# Patient Record
Sex: Male | Born: 1935 | Race: White | Hispanic: No | Smoking: Former smoker
Health system: Southern US, Community
[De-identification: ages and names within clinical notes are randomized; demographics above are authoritative.]

## PROBLEM LIST (undated history)

## (undated) DIAGNOSIS — K219 Gastro-esophageal reflux disease without esophagitis: Secondary | ICD-10-CM

## (undated) DIAGNOSIS — H9190 Unspecified hearing loss, unspecified ear: Secondary | ICD-10-CM

## (undated) DIAGNOSIS — K922 Gastrointestinal hemorrhage, unspecified: Secondary | ICD-10-CM

## (undated) HISTORY — PX: APPENDECTOMY: SHX54

## (undated) HISTORY — PX: OTHER SURGICAL HISTORY: SHX169

---

## 2006-04-02 ENCOUNTER — Ambulatory Visit: Payer: Self-pay | Admitting: Ophthalmology

## 2006-04-13 ENCOUNTER — Ambulatory Visit: Payer: Self-pay | Admitting: Ophthalmology

## 2008-11-26 ENCOUNTER — Observation Stay: Payer: Self-pay | Admitting: Surgery

## 2011-04-28 ENCOUNTER — Ambulatory Visit: Payer: Self-pay | Admitting: Ophthalmology

## 2011-05-11 ENCOUNTER — Ambulatory Visit: Payer: Self-pay | Admitting: Ophthalmology

## 2016-04-12 DIAGNOSIS — K922 Gastrointestinal hemorrhage, unspecified: Secondary | ICD-10-CM

## 2016-04-12 HISTORY — DX: Gastrointestinal hemorrhage, unspecified: K92.2

## 2016-05-11 ENCOUNTER — Inpatient Hospital Stay (HOSPITAL_COMMUNITY)
Admission: EM | Admit: 2016-05-11 | Discharge: 2016-05-19 | DRG: 356 | Disposition: A | Discharge status: Skilled Nursing Facility | Payer: Medicare Other | Source: Other Acute Inpatient Hospital | Attending: Internal Medicine | Admitting: Internal Medicine

## 2016-05-11 ENCOUNTER — Emergency Department: Payer: Medicare Other

## 2016-05-11 ENCOUNTER — Inpatient Hospital Stay (HOSPITAL_COMMUNITY): Payer: Medicare Other

## 2016-05-11 ENCOUNTER — Emergency Department
Admission: EM | Admit: 2016-05-11 | Discharge: 2016-05-11 | Disposition: A | Discharge status: Still In Hospital | Payer: Medicare Other | Attending: Emergency Medicine | Admitting: Emergency Medicine

## 2016-05-11 DIAGNOSIS — I959 Hypotension, unspecified: Secondary | ICD-10-CM | POA: Insufficient documentation

## 2016-05-11 DIAGNOSIS — R55 Syncope and collapse: Secondary | ICD-10-CM | POA: Insufficient documentation

## 2016-05-11 DIAGNOSIS — K5521 Angiodysplasia of colon with hemorrhage: Principal | ICD-10-CM | POA: Diagnosis present

## 2016-05-11 DIAGNOSIS — Z7982 Long term (current) use of aspirin: Secondary | ICD-10-CM

## 2016-05-11 DIAGNOSIS — R109 Unspecified abdominal pain: Secondary | ICD-10-CM

## 2016-05-11 DIAGNOSIS — I700 Atherosclerosis of aorta: Secondary | ICD-10-CM | POA: Diagnosis present

## 2016-05-11 DIAGNOSIS — D6959 Other secondary thrombocytopenia: Secondary | ICD-10-CM | POA: Diagnosis present

## 2016-05-11 DIAGNOSIS — E872 Acidosis: Secondary | ICD-10-CM | POA: Diagnosis present

## 2016-05-11 DIAGNOSIS — E876 Hypokalemia: Secondary | ICD-10-CM

## 2016-05-11 DIAGNOSIS — H9190 Unspecified hearing loss, unspecified ear: Secondary | ICD-10-CM | POA: Diagnosis present

## 2016-05-11 DIAGNOSIS — I10 Essential (primary) hypertension: Secondary | ICD-10-CM | POA: Diagnosis present

## 2016-05-11 DIAGNOSIS — I9589 Other hypotension: Secondary | ICD-10-CM

## 2016-05-11 DIAGNOSIS — D62 Acute posthemorrhagic anemia: Secondary | ICD-10-CM

## 2016-05-11 DIAGNOSIS — G9340 Encephalopathy, unspecified: Secondary | ICD-10-CM

## 2016-05-11 DIAGNOSIS — M25562 Pain in left knee: Secondary | ICD-10-CM

## 2016-05-11 DIAGNOSIS — R571 Hypovolemic shock: Secondary | ICD-10-CM | POA: Diagnosis present

## 2016-05-11 DIAGNOSIS — N179 Acute kidney failure, unspecified: Secondary | ICD-10-CM | POA: Diagnosis present

## 2016-05-11 DIAGNOSIS — D696 Thrombocytopenia, unspecified: Secondary | ICD-10-CM | POA: Diagnosis not present

## 2016-05-11 DIAGNOSIS — I248 Other forms of acute ischemic heart disease: Secondary | ICD-10-CM | POA: Diagnosis present

## 2016-05-11 DIAGNOSIS — I774 Celiac artery compression syndrome: Secondary | ICD-10-CM | POA: Diagnosis present

## 2016-05-11 DIAGNOSIS — K922 Gastrointestinal hemorrhage, unspecified: Secondary | ICD-10-CM | POA: Diagnosis not present

## 2016-05-11 DIAGNOSIS — R933 Abnormal findings on diagnostic imaging of other parts of digestive tract: Secondary | ICD-10-CM | POA: Diagnosis not present

## 2016-05-11 DIAGNOSIS — R1084 Generalized abdominal pain: Secondary | ICD-10-CM | POA: Diagnosis not present

## 2016-05-11 DIAGNOSIS — R0600 Dyspnea, unspecified: Secondary | ICD-10-CM

## 2016-05-11 DIAGNOSIS — J690 Pneumonitis due to inhalation of food and vomit: Secondary | ICD-10-CM | POA: Diagnosis not present

## 2016-05-11 DIAGNOSIS — R935 Abnormal findings on diagnostic imaging of other abdominal regions, including retroperitoneum: Secondary | ICD-10-CM

## 2016-05-11 DIAGNOSIS — K567 Ileus, unspecified: Secondary | ICD-10-CM | POA: Diagnosis not present

## 2016-05-11 DIAGNOSIS — I2510 Atherosclerotic heart disease of native coronary artery without angina pectoris: Secondary | ICD-10-CM | POA: Diagnosis present

## 2016-05-11 DIAGNOSIS — R0602 Shortness of breath: Secondary | ICD-10-CM | POA: Diagnosis present

## 2016-05-11 DIAGNOSIS — D72829 Elevated white blood cell count, unspecified: Secondary | ICD-10-CM | POA: Diagnosis not present

## 2016-05-11 DIAGNOSIS — R4781 Slurred speech: Secondary | ICD-10-CM

## 2016-05-11 HISTORY — DX: Gastrointestinal hemorrhage, unspecified: K92.2

## 2016-05-11 HISTORY — DX: Unspecified hearing loss, unspecified ear: H91.90

## 2016-05-11 LAB — CBC WITH DIFFERENTIAL/PLATELET
BAND NEUTROPHILS: 0 %
BASOS PCT: 0 %
Basophils Absolute: 0 10*3/uL (ref 0–0.1)
Blasts: 0 %
EOS ABS: 0 10*3/uL (ref 0–0.7)
Eosinophils Relative: 0 %
HCT: 23.6 % — ABNORMAL LOW (ref 40.0–52.0)
HEMOGLOBIN: 7.8 g/dL — AB (ref 13.0–18.0)
Lymphocytes Relative: 66 %
Lymphs Abs: 10.9 10*3/uL — ABNORMAL HIGH (ref 1.0–3.6)
MCH: 30.9 pg (ref 26.0–34.0)
MCHC: 33 g/dL (ref 32.0–36.0)
MCV: 93.5 fL (ref 80.0–100.0)
MONO ABS: 0 10*3/uL — AB (ref 0.2–1.0)
MYELOCYTES: 0 %
Metamyelocytes Relative: 1 %
Monocytes Relative: 0 %
NEUTROS PCT: 33 %
NRBC: 0 /100{WBCs}
Neutro Abs: 5.6 10*3/uL (ref 1.4–6.5)
Other: 0 %
PROMYELOCYTES ABS: 0 %
Platelets: 114 10*3/uL — ABNORMAL LOW (ref 150–440)
RBC: 2.52 MIL/uL — ABNORMAL LOW (ref 4.40–5.90)
RDW: 14.5 % (ref 11.5–14.5)
WBC: 16.5 10*3/uL — ABNORMAL HIGH (ref 3.8–10.6)

## 2016-05-11 LAB — COMPREHENSIVE METABOLIC PANEL
ALT: 9 U/L — ABNORMAL LOW (ref 17–63)
AST: 22 U/L (ref 15–41)
Albumin: 3.2 g/dL — ABNORMAL LOW (ref 3.5–5.0)
Alkaline Phosphatase: 48 U/L (ref 38–126)
Anion gap: 4 — ABNORMAL LOW (ref 5–15)
BILIRUBIN TOTAL: 0.5 mg/dL (ref 0.3–1.2)
BUN: 26 mg/dL — AB (ref 6–20)
CO2: 23 mmol/L (ref 22–32)
Calcium: 7.9 mg/dL — ABNORMAL LOW (ref 8.9–10.3)
Chloride: 111 mmol/L (ref 101–111)
Creatinine, Ser: 1.09 mg/dL (ref 0.61–1.24)
GFR, EST NON AFRICAN AMERICAN: 58 mL/min — AB (ref >60)
Glucose, Bld: 217 mg/dL — ABNORMAL HIGH (ref 65–99)
POTASSIUM: 3.5 mmol/L (ref 3.5–5.1)
Sodium: 138 mmol/L (ref 135–145)
TOTAL PROTEIN: 5.1 g/dL — AB (ref 6.5–8.1)

## 2016-05-11 LAB — TROPONIN I

## 2016-05-11 LAB — LIPASE, BLOOD: LIPASE: 37 U/L (ref 11–51)

## 2016-05-11 LAB — MASSIVE TRANSFUSION PROTOCOL ORDER (BLOOD BANK NOTIFICATION)

## 2016-05-11 LAB — ABO/RH: ABO/RH(D): O POS

## 2016-05-11 LAB — GLUCOSE, CAPILLARY: Glucose-Capillary: 143 mg/dL — ABNORMAL HIGH (ref 65–99)

## 2016-05-11 LAB — PROTIME-INR
INR: 1.25
PROTHROMBIN TIME: 15.8 s — AB (ref 11.4–15.2)

## 2016-05-11 MED ORDER — SODIUM CHLORIDE 0.9 % IV SOLN
10.0000 mL/h | Freq: Once | INTRAVENOUS | Status: AC
  Administered 2016-05-11: 10 mL/h via INTRAVENOUS

## 2016-05-11 MED ORDER — LIDOCAINE HCL 1 % IJ SOLN
INTRAMUSCULAR | Status: AC
  Filled 2016-05-11: qty 20

## 2016-05-11 MED ORDER — MIDAZOLAM HCL 2 MG/2ML IJ SOLN
INTRAMUSCULAR | Status: AC
  Filled 2016-05-11: qty 4

## 2016-05-11 MED ORDER — FENTANYL CITRATE (PF) 100 MCG/2ML IJ SOLN
INTRAMUSCULAR | Status: AC
  Filled 2016-05-11: qty 4

## 2016-05-11 MED ORDER — IOPAMIDOL (ISOVUE-300) INJECTION 61%
INTRAVENOUS | Status: AC
  Administered 2016-05-12: 80 mL
  Filled 2016-05-11: qty 150

## 2016-05-11 MED ORDER — IOPAMIDOL (ISOVUE-370) INJECTION 76%
75.0000 mL | Freq: Once | INTRAVENOUS | Status: AC | PRN
  Administered 2016-05-11: 75 mL via INTRAVENOUS

## 2016-05-11 MED ORDER — SODIUM CHLORIDE 0.9 % IV SOLN
1.0000 g | Freq: Once | INTRAVENOUS | Status: DC
  Filled 2016-05-11: qty 10

## 2016-05-11 MED ORDER — IOPAMIDOL (ISOVUE-300) INJECTION 61%
INTRAVENOUS | Status: AC
  Administered 2016-05-12: 80 mL
  Filled 2016-05-11: qty 100

## 2016-05-11 MED ORDER — ONDANSETRON HCL 4 MG/2ML IJ SOLN
INTRAMUSCULAR | Status: AC
  Filled 2016-05-11: qty 2

## 2016-05-11 NOTE — ED Notes (Signed)
Report called to Zac[facility] RN

## 2016-05-11 NOTE — ED Notes (Signed)
Pt arrived via ems for c/o gi bleed and syncope - pt stated Sat he started having dark blood in stool - stated when he stood up today he had approx 235ml of blood come out of his rectum - at this time pt has blood oozing from his rectum - blood noted on bilat legs - MD aware and at bedside

## 2016-05-11 NOTE — Consult Note (Signed)
Chief Complaint: Bright red blood per rectum  Referring Physician(s): Dr Halford Chessman  History of Present Illness: Dustin Howell is a 82 y.o. male referred urgently to VIR for evaluation of BRBPR.   VIR evaluated Dustin Howell in the ICU at [facility] after transfer, with his daughter present, who provided about half of the given history.   Dustin Howell presented to the ED at [facility] with about 2 days of episodic bleeding per rectum.  Before 2 days ago, this had never happened.  He is feeling weak and lightheaded.  Bleeding is painless.  No N/V/D.    I spoke to the ED physician at [facility], Dr. Joni Fears, before the transfer, and the patient was hemodynamically unstable, with labile BP ranging from 0000000 systolic to [id number].  He was undergoing fluid resuscitation at the time.    At the time of our interview, he had received 7U of PRBC's, and 1U FFP.  (record per nursing).  At the time of interview, SBP is 70-80, HR is 90-100, and 100%O2 sat on RA.  He is comfortable in bed, with no abdominal pain.    He denies ever having a colonoscopy, and tells me he does not want one.  His PCP is Dr. [name] at [facility].  His last appointment was about 1 year ago.  He denies any significant health problems, including a heart history.  He does not have a cardiologist.   CTA performed at [facility] demonstrates evidence of active extrav at the hepatic flexure.    Medical History:  He tells me he does not take medications.  His BP has been normal in the past. No hx of MI. He has dermatologic problems, but his dermatologist retired sometime in the past.   Surgical History: Appendectomy.  Pilonidal cyst excision as child.   Social History:  His is a widower.  His wife died of colon CA.  He has 1 daughter (present), and 1 son in [place]. He denies ever smoking, but does use chewing tobacco.  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    Allergies: Review of patient's allergies indicates no known  allergies.  Medications: Prior to Admission medications   Medication Sig Start Date End Date Taking? Authorizing Provider  aspirin EC 81 MG tablet Take 1 tablet by mouth daily.    Historical Provider, MD     No family history on file.  Social History   Social History  . Marital status: Married    Spouse name: N/A  . Number of children: N/A  . Years of education: N/A   Social History Main Topics  . Smoking status: Never Smoker  . Smokeless tobacco: Never Used  . Alcohol use Yes     Comment: every evening with supper  . Drug use: Unknown  . Sexual activity: Not on file   Other Topics Concern  . Not on file   Social History Narrative  . No narrative on file       Review of Systems: A 12 point ROS discussed and pertinent positives are indicated in the HPI above.  All other systems are negative.  Review of Systems  Vital Signs: BP (!) 72/50   Pulse 100   Temp 98.2 F (36.8 C) (Oral)   Resp (!) 23   SpO2 100%   Physical Exam  A&O x 3 Non-tender abdomen. Palpable bilateral lower extremity pulses.   Mallampati Score:  2  Imaging: Ct Angio Abd/pel W And/or Wo Contrast  Result  Date: 05/11/2016 CLINICAL DATA:  Acute onset of syncope and bright red rectal bleeding. Lightheadedness and generalized weakness. Initial encounter. EXAM: CTA ABDOMEN AND PELVIS wITHOUT AND WITH CONTRAST TECHNIQUE: Multidetector CT imaging of the abdomen and pelvis was performed using the standard protocol during bolus administration of intravenous contrast. Multiplanar reconstructed images and MIPs were obtained and reviewed to evaluate the vascular anatomy. CONTRAST:  75 mL of Isovue 370 IV contrast COMPARISON:  None. FINDINGS: VASCULAR Aorta: Scattered calcification is seen along the abdominal aorta, without significant luminal narrowing. 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 SMA: The superior mesenteric artery  appears patent, without evidence of luminal narrowing. Renals: Mild calcification is noted at the proximal renal arteries bilaterally, without significant luminal narrowing. IMA: The inferior mesenteric artery remains patent. Inflow: Scattered calcification is seen along the common and internal iliac arteries. The external iliac arteries and common femoral arteries remain fully patent bilaterally, with mild calcification at the right common femoral artery. Proximal Outflow: Minimal mural thrombus is suggested at the proximal left superficial femoral artery. Minimal mural thrombus is suggested at the proximal right profunda femoris artery. Veins: Visualized venous structures are grossly unremarkable. The inferior vena cava is partially decompressed and unremarkable in appearance. Review of the MIP images confirms the above findings. NON-VASCULAR Lower chest: Mild bibasilar atelectasis or scarring is noted. Diffuse coronary artery calcifications are seen. The visualized portions of the mediastinum are otherwise unremarkable. Hepatobiliary: The liver is unremarkable in appearance. The gallbladder is unremarkable in appearance. The common bile duct remains normal in caliber. Pancreas: The pancreas is within normal limits. Spleen: The spleen is unremarkable in appearance. Adrenals/Urinary Tract: The adrenal glands are unremarkable in appearance. A large 7.5 cm cyst is noted at the upper pole of the left kidney. Mild nonspecific perinephric stranding is noted bilaterally. Additional scattered small bilateral renal cysts are seen. Mild left-sided renal pelvicaliectasis remains within normal limits, without evidence of significant hydronephrosis. No renal or ureteral stones are identified. Stomach/Bowel: The stomach is unremarkable in appearance. The small bowel is within normal limits. The appendix is not visualized; there is no evidence for appendicitis. Scattered diverticulosis is noted along the proximal sigmoid colon,  without evidence of diverticulitis. There is a large amount of acute extravasation of contrast at the ascending colon, just proximal to the hepatic flexure of the colon, compatible with active intraluminal hemorrhage. This explains the patient's bright red blood per rectum. Lymphatic: Retroperitoneal nodes are grossly unremarkable in appearance. No pelvic sidewall lymphadenopathy is appreciated. Reproductive: The bladder is mildly distended and grossly unremarkable. The prostate remains normal in size. Other: No additional soft tissue abnormalities are seen. Musculoskeletal: No acute osseous abnormalities are identified. Vacuum phenomenon is noted at L5-S1. Underlying facet disease is noted. The visualized musculature is unremarkable in appearance. IMPRESSION: VASCULAR 1. Large amount of acute extravasation of contrast at the ascending colon, just proximal to the hepatic flexure of the colon, compatible with active intraluminal hemorrhage. This explains the patient's bright red blood per rectum. 2. Scattered aortic atherosclerosis noted. 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 3. Diffuse coronary artery calcifications seen. NON-VASCULAR 1. Mild bibasilar atelectasis or scarring noted. 2. Scattered bilateral renal cysts, measuring up to 7.5 cm on the left. 3. Scattered diverticulosis along the proximal sigmoid colon, without evidence of diverticulitis. These results were called by telephone at the time of interpretation on 05/11/2016 at 9:58 pm to Dr. Carrie Mew, who verbally  acknowledged these results. Electronically Signed   By: Garald Balding M.D.   On: 05/11/2016 22:04    Labs:  CBC:  Recent Labs  05/11/16 1741  WBC 16.5*  HGB 7.8*  HCT 23.6*  PLT 114*    COAGS:  Recent Labs  05/11/16 1741  INR 1.25    BMP:  Recent Labs  05/11/16 1741  NA 138  K 3.5  CL 111  CO2 23  GLUCOSE 217*  BUN 26*  CALCIUM 7.9*   CREATININE 1.09  GFRNONAA 58*  GFRAA >60    LIVER FUNCTION TESTS:  Recent Labs  05/11/16 1741  BILITOT 0.5  AST 22  ALT 9*  ALKPHOS 48  PROT 5.1*  ALBUMIN 3.2*    TUMOR MARKERS: No results for input(s): AFPTM, CEA, CA199, CHROMGRNA in the last 8760 hours.  Assessment and Plan:  82 yo male with acute lower GI bleeding, life-threatening.    Given his hemodynamic compromise and on-going resuscitative efforts, as well as the evidence of acute hemorrhage on CTA, angiogram and embolization is indicated.   I have discussed mesenteric angiogram and embolization with the patient and his daughter, including the risk/benefit analysis.  Specific risks discussed with the patient include, but not limited to bleeding, infection, vascular injury or contrast induced renal failure, need for further procedure/surgery, cardiopulmonary collapse, death. All of the patient's questions were answered, patient is agreeable to proceed. Consent signed and in chart.   Electronically Signed: Corrie Mckusick 05/11/2016, 11:17 PM   I spent a total of 27 Mi[place]utes    in face to face in clinical consultation, greater than 50% of which was counseling/coordinating care for Acute life-threatening GI hemorrhage, mesenteric angiogram, possible embolization.

## 2016-05-11 NOTE — ED Provider Notes (Signed)
[facility] Emergency Department Provider Note  ____________________________________________  Time seen: Approximately 7:42 PM  I have reviewed the triage vital signs and the nursing notes.   HISTORY  Chief Complaint GI Bleeding  Level 5 caveat:  Portions of the history and physical were unable to be obtained due to the patient's acute illness   HPI Dustin Howell is a 82 y.o. male who presents with syncope. He's been having bright red rectal bleeding for the past 2 days seems to be accelerating. Large volumes of red blood. Feels lightheaded and generally weak. No chest pain or shortness of breath at present time. Does not take blood thinners. Denies history of diverticulosis. Cannot Remember the last time he had a colonoscopy.    History reviewed. No pertinent past medical history. Past Medical History: has a past medical history of Degenerative joint disease of cervical spine; Osteoarthritis; and Resting tremor. Past Surgical History: has a past surgical history that includes Appendectomy; vasectomy; S/P pilonidal cyst surgery; and Cataract extraction. Social History: reports that he quit smoking about 58 years ago. His smoking use included Cigarettes. He has a 3.75 pack-year smoking history. He uses smokeless tobacco. He reports that he drinks alcohol. He reports that he does not use illicit drugs. Current Medications: has a current medication list which includes the following prescription(s): aspirin. Allergies: has No Known Allergies.      There are no active problems to display for this patient.    History reviewed. No pertinent surgical history.   Prior to Admission medications   Not on File     Allergies Review of patient's allergies indicates no known allergies.   No family history on file.  Social History Social History  Substance Use Topics  . Smoking status: Never Smoker  . Smokeless tobacco: Never Used  . Alcohol use Yes   Comment: every evening with supper    Review of Systems  Constitutional:   No fever or chills.  ENT:   No sore throat. No rhinorrhea. Cardiovascular:   No chest pain. Respiratory:   No dyspnea or cough. Gastrointestinal:   Negative for abdominal pain, Or vomiting. Positive rectal bleeding.   10-point ROS otherwise negative.  ____________________________________________   PHYSICAL EXAM:  VITAL SIGNS: ED Triage Vitals  Enc Vitals Group     BP 05/11/16 1741 110/68     Pulse Rate 05/11/16 1741 90     Resp 05/11/16 1741 20     Temp 05/11/16 1741 97.6 F (36.4 C)     Temp Source 05/11/16 1741 Oral     SpO2 05/11/16 1737 97 %     Weight 05/11/16 1742 125 lb (56.7 kg)     Height 05/11/16 1742 5\' 7"  (1.702 m)     Head Circumference --      Peak Flow --      Pain Score 05/11/16 1742 0     Pain Loc --      Pain Edu? --      Excl. in [place]? --     Vital signs reviewed, nursing assessments reviewed.   Constitutional:   Alert and oriented.Ill-appearing. Eyes:   No scleral icterus. Positive conjunctival pallor. PERRL. EOMI.  No nystagmus. ENT   Head:   Normocephalic and atraumatic.   Nose:   No congestion/rhinnorhea. No septal hematoma   Mouth/Throat:   Dry and experienced, no pharyngeal erythema. No peritonsillar mass.    Neck:   No stridor. No SubQ emphysema. No meningismus. Hematological/Lymphatic/Immunilogical:   No  cervical lymphadenopathy. Cardiovascular:   Tachycardia heart rate 110. Symmetric bilateral radial and DP pulses.  No murmurs.  Respiratory:   Normal respiratory effort without tachypnea nor retractions. Breath sounds are clear and equal bilaterally. No wheezes/rales/rhonchi. Gastrointestinal:   Soft and nontender. Non distended. There is no CVA tenderness.  No rebound, rigidity, or guarding. Large volume fresh red rectal bleeding Genitourinary:   deferred Musculoskeletal:   Nontender with normal range of motion in all extremities. No joint effusions.   No lower extremity tenderness.  No edema. Neurologic:   Normal speech and language.  CN 2-10 normal. Motor grossly intact. No gross focal neurologic deficits are appreciated.  Skin:    Skin is warm, dry and intact. No rash noted.  No petechiae, purpura, or bullae.  ____________________________________________    LABS (pertinent positives/negatives) (all labs ordered are listed, but only abnormal results are displayed) Labs Reviewed  COMPREHENSIVE METABOLIC PANEL - Abnormal; Notable for the following:       Result Value   Glucose, Bld 217 (*)    BUN 26 (*)    Calcium 7.9 (*)    Total Protein 5.1 (*)    Albumin 3.2 (*)    ALT 9 (*)    GFR calc non Af Amer 58 (*)    Anion gap 4 (*)    All other components within normal limits  PROTIME-INR - Abnormal; Notable for the following:    Prothrombin Time 15.8 (*)    All other components within normal limits  LIPASE, BLOOD  TROPONIN I  CBC WITH DIFFERENTIAL/PLATELET  DIC (DISSEMINATED INTRAVASCULAR COAGULATION) PANEL  HEMOGLOBIN AND HEMATOCRIT, BLOOD  PREPARE RBC (CROSSMATCH)  TYPE AND SCREEN  ABO/RH  MASSIVE TRANSFUSION PROTOCOL ORDER (BLOOD BANK NOTIFICATION)   ____________________________________________   EKG    ____________________________________________    RADIOLOGY    ____________________________________________   PROCEDURES Procedures CRITICAL CARE Performed by: Joni Fears, Gill Delrossi   Total critical care time: 75 minutes  Critical care time was exclusive of separately billable procedures and treating other patients.  Critical care was necessary to treat or prevent imminent or life-threatening deterioration.  Critical care was time spent personally by me on the following activities: development of treatment plan with patient and/or surrogate as well as nursing, discussions with consultants, evaluation of patient's response to treatment, examination of patient, obtaining history from patient or surrogate,  ordering and performing treatments and interventions, ordering and review of laboratory studies, ordering and review of radiographic studies, pulse oximetry and re-evaluation of patient's condition.  ____________________________________________   INITIAL IMPRESSION / ASSESSMENT AND PLAN / ED COURSE  Pertinent labs & imaging results that were available during my care of the patient were reviewed by me and considered in my medical decision making (see chart for details).  Treatment patient presents with brisk rectal bleeding, hypotension to 90/50, heart rate 110. Hemorrhagic shock. Concern for ongoing bleeding.  Discussed with blood bank to give the patient 2 units of emergency release blood immediately for initial stabilization. IV fluids wide open     Clinical Course  Comment By Time  Still passing very large bloody bowel movements, likely 200-318ml of blood at a time.  Most recent bp 65/40.  Massive transfusion activated with blood bank. Wide open fluids. Blood warmer.  Carrie Mew, MD 10/30 1931  Critical care, GI, IR all paged to expedite management.  D/w critical care who will evaluate for admission.  Carrie Mew, MD 10/30 1938  D/w IR Dr. Earleen Newport. Unable to consult on pt at [facility].  Will plan to transfer to [place] MICU for IR evaluation. CTA a/p for now to localize. D/w GI Dr. Truman Hayward, advised pt likely is transferred, will reconsult as needed. Carrie Mew, MD 10/30 1948  D/w [facility] / critical care [facility] link. Will arrange transfer to [facility] cone ICU.  Carrie Mew, MD 10/30 1955  CBC just resulted.  Hb 8 prior to intiation of transfusions. At this point after the lab delay, result is somewhat meaningless. Will follow up serial labs with transfusions Carrie Mew, MD 10/30 2014    ----------------------------------------- 8:35 PM on 05/11/2016 -----------------------------------------  Blood pressure stabilized, most recently 140/70. He's received 4 units of red blood  cells, now receiving FFP and platelets. Overall plan is for admission to [place] ICU, transport by [facility], evaluation by interventional radiology at [facility] for embolization as needed. Patient is planned for CT of the abdomen and pelvis here to localize the bleeding lesion. We'll continue to manage her hemodynamics until transfer.  ----------------------------------------- 9:16 PM on 05/11/2016 -----------------------------------------  Blood pressure remained stable 140/80 after units 3 through 6 of red cells transfused. FFP infusing as well. Patient obtaining CT scan right now. [facility] arrived to transfer the patient to [facility]. He is stabilized for transport. ____________________________________________   FINAL CLINICAL IMPRESSION(S) / ED DIAGNOSES  Final diagnoses:  Acute lower GI hemorrhage  Other specified hypotension       Portions of this note were generated with dragon dictation software. Dictation errors may occur despite best attempts at proofreading.    Carrie Mew, MD 05/11/16 2117

## 2016-05-11 NOTE — ED Notes (Signed)
Cleaned up pt and changed linens. Placed brief on pt. Comfort measures provided.

## 2016-05-11 NOTE — ED Notes (Signed)
On unit of blood transferred with [facility] per MD

## 2016-05-11 NOTE — ED Notes (Signed)
C/o left leg pain prior to blood transfusion and continues to c/o constant pain in left leg

## 2016-05-11 NOTE — ED Notes (Signed)
Called pharmacy to send calcium gluconate

## 2016-05-11 NOTE — H&P (Signed)
PULMONARY / CRITICAL CARE MEDICINE   Name: Dustin Howell MRN: [id number] DOB: Aug 30, 1924    ADMISSION DATE:  05/11/2016 CONSULTATION DATE:  05/11/2016  REFERRING MD:  Dr. Joni Fears, [facility]  CHIEF COMPLAINT:  GI bleeding  HISTORY OF PRESENT ILLNESS:   82 year old male with no significant past medical history taking only a baby aspirin daily. Presented to [facility] emergency department 10/30 with complaints of lower GI bleeding since 10/28 and new development of syncope on 10/30. In the emergency department he denied chest pain and shortness of breath. Also in the emergency department he became hypotensive with blood pressure 65/40. The massive transfusion protocol was activated and he received 4 units of packed red blood cells, 1 unit of FFP, and 1 unit of platelets. He underwent CT angiogram of the abdomen to hopefully localize a source of bleeding, which was identified. He was transferred to [facility] for ICU admission and likely IR embolization. PCCM to except.  PAST MEDICAL HISTORY :  He  has no past medical history on file.  PAST SURGICAL HISTORY: He  has no past surgical history on file.  No Known Allergies  No current facility-administered medications on file prior to encounter.    Current Outpatient Prescriptions on File Prior to Encounter  Medication Sig  . aspirin EC 81 MG tablet Take 1 tablet by mouth daily.    FAMILY HISTORY:  His has no family status information on file.    SOCIAL HISTORY: He  reports that he has never smoked. He has never used smokeless tobacco. He reports that he drinks alcohol.  REVIEW OF SYSTEMS:     SUBJECTIVE:    VITAL SIGNS: BP (!) 72/50   Pulse 100   Temp 98.2 F (36.8 C) (Oral)   Resp (!) 23   SpO2 100%   HEMODYNAMICS:    VENTILATOR SETTINGS:    INTAKE / OUTPUT: No intake/output data recorded.  PHYSICAL EXAMINATION: General:  Elderly male in NAD Neuro:  Alert, oriented. Speech somewhat slurred. Daughter agrees,  however, no focal abnormalities.  HEENT:  [place]/AT, PERRL, no JVD Cardiovascular: RRR, no MRG Lungs:  Clear Abdomen:  Non-tender, non-distended, soft Musculoskeletal:  No acute deformity or ROM limitation Skin:  Grossly intact  LABS:  BMET  Recent Labs Lab 05/11/16 1741  NA 138  K 3.5  CL 111  CO2 23  BUN 26*  CREATININE 1.09  GLUCOSE 217*    Electrolytes  Recent Labs Lab 05/11/16 1741  CALCIUM 7.9*    CBC  Recent Labs Lab 05/11/16 1741  WBC 16.5*  HGB 7.8*  HCT 23.6*  PLT 114*    Coag's  Recent Labs Lab 05/11/16 1741  INR 1.25    Sepsis Markers No results for input(s): LATICACIDVEN, PROCALCITON, O2SATVEN in the last 168 hours.  ABG No results for input(s): PHART, PCO2ART, PO2ART in the last 168 hours.  Liver Enzymes  Recent Labs Lab 05/11/16 1741  AST 22  ALT 9*  ALKPHOS 48  BILITOT 0.5  ALBUMIN 3.2*    Cardiac Enzymes  Recent Labs Lab 05/11/16 1741  TROPONINI <0.03    Glucose  Recent Labs Lab 05/11/16 2244  GLUCAP 143*    Imaging Ct Angio Abd/pel W And/or Wo Contrast  Result Date: 05/11/2016 CLINICAL DATA:  Acute onset of syncope and bright red rectal bleeding. Lightheadedness and generalized weakness. Initial encounter. EXAM: CTA ABDOMEN AND PELVIS wITHOUT AND WITH CONTRAST TECHNIQUE: Multidetector CT imaging of the abdomen and pelvis was performed using the standard  protocol during bolus administration of intravenous contrast. Multiplanar reconstructed images and MIPs were obtained and reviewed to evaluate the vascular anatomy. CONTRAST:  75 mL of Isovue 370 IV contrast COMPARISON:  None. FINDINGS: VASCULAR Aorta: Scattered calcification is seen along the abdominal aorta, without significant luminal narrowing. 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 SMA: The superior mesenteric artery appears patent, without evidence of luminal narrowing. Renals: Mild  calcification is noted at the proximal renal arteries bilaterally, without significant luminal narrowing. IMA: The inferior mesenteric artery remains patent. Inflow: Scattered calcification is seen along the common and internal iliac arteries. The external iliac arteries and common femoral arteries remain fully patent bilaterally, with mild calcification at the right common femoral artery. Proximal Outflow: Minimal mural thrombus is suggested at the proximal left superficial femoral artery. Minimal mural thrombus is suggested at the proximal right profunda femoris artery. Veins: Visualized venous structures are grossly unremarkable. The inferior vena cava is partially decompressed and unremarkable in appearance. Review of the MIP images confirms the above findings. NON-VASCULAR Lower chest: Mild bibasilar atelectasis or scarring is noted. Diffuse coronary artery calcifications are seen. The visualized portions of the mediastinum are otherwise unremarkable. Hepatobiliary: The liver is unremarkable in appearance. The gallbladder is unremarkable in appearance. The common bile duct remains normal in caliber. Pancreas: The pancreas is within normal limits. Spleen: The spleen is unremarkable in appearance. Adrenals/Urinary Tract: The adrenal glands are unremarkable in appearance. A large 7.5 cm cyst is noted at the upper pole of the left kidney. Mild nonspecific perinephric stranding is noted bilaterally. Additional scattered small bilateral renal cysts are seen. Mild left-sided renal pelvicaliectasis remains within normal limits, without evidence of significant hydronephrosis. No renal or ureteral stones are identified. Stomach/Bowel: The stomach is unremarkable in appearance. The small bowel is within normal limits. The appendix is not visualized; there is no evidence for appendicitis. Scattered diverticulosis is noted along the proximal sigmoid colon, without evidence of diverticulitis. There is a large amount of acute  extravasation of contrast at the ascending colon, just proximal to the hepatic flexure of the colon, compatible with active intraluminal hemorrhage. This explains the patient's bright red blood per rectum. Lymphatic: Retroperitoneal nodes are grossly unremarkable in appearance. No pelvic sidewall lymphadenopathy is appreciated. Reproductive: The bladder is mildly distended and grossly unremarkable. The prostate remains normal in size. Other: No additional soft tissue abnormalities are seen. Musculoskeletal: No acute osseous abnormalities are identified. Vacuum phenomenon is noted at L5-S1. Underlying facet disease is noted. The visualized musculature is unremarkable in appearance. IMPRESSION: VASCULAR 1. Large amount of acute extravasation of contrast at the ascending colon, just proximal to the hepatic flexure of the colon, compatible with active intraluminal hemorrhage. This explains the patient's bright red blood per rectum. 2. Scattered aortic atherosclerosis noted. 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 3. Diffuse coronary artery calcifications seen. NON-VASCULAR 1. Mild bibasilar atelectasis or scarring noted. 2. Scattered bilateral renal cysts, measuring up to 7.5 cm on the left. 3. Scattered diverticulosis along the proximal sigmoid colon, without evidence of diverticulitis. These results were called by telephone at the time of interpretation on 05/11/2016 at 9:58 pm to Dr. Carrie Mew, who verbally acknowledged these results. Electronically Signed   By: Garald Balding M.D.   On: 05/11/2016 22:04     STUDIES:  10/30 CTA abd/pelvis > Large amount of acute extravasation of contrast at the ascending colon, just proximal to the hepatic  flexure of the colon, compatible with active intraluminal hemorrhage. This explains the patient's bright red blood per rectum. Scattered aortic atherosclerosis noted. 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 Mild bibasilar atelectasis or scarring noted. Scattered bilateral renal cysts, measuring up to 7.5 cm on the left. Scattered diverticulosis along the proximal sigmoid colon, without evidence of diverticulitis.  CULTURES:  ANTIBIOTICS:   SIGNIFICANT EVENTS:   LINES/TUBES:   DISCUSSION: 82 year old male with no significant medical history presenting with acute lower GI bleeding. Source identified on CT angiogram. Transferred to [facility] for IR embolization. He did become hypotensive requiring blood products. We'll monitor hemodynamics and ICU. And transfuse as necessary until bleeding source is controlled in IR.  ASSESSMENT / PLAN:  PULMONARY A: No acute issues  P:   Mointor with IS  CARDIOVASCULAR A:  Hypotension related to GIB  P:  Telemetry Blood products as needed EKG non-ischemic, Trend troponin Hold home ASA  RENAL A:   No acute issues  P:   Follow BMP  GASTROINTESTINAL A:   GIB  P:   NPO See heme Pepcid for SUP  HEMATOLOGIC A:   ABLA secondary to GIB (4 units PRBC, 1FFP, 1plt at [facility]) P:  To IR for embolization Type and screen Transfuse to keep Hgb > 8 in setting active bleed Coags OK  INFECTIOUS A:   No acute issues  P:   monitor  ENDOCRINE A:   No acute issues  P:   monitor  NEUROLOGIC A:   Slurred speech  P:   If persists after IR embolization and hemodynamic stability will need head CT. No focal abnormalities.  MSK A: L knee pain (? Fall at home) P: X ray L knee   FAMILY  - Updates: Daughter updated at bedside  - Inter-disciplinary family meet or Palliative Care meeting due by:  11/6  APP critical care time 53mins.   Georgann Housekeeper, AGACNP-BC  Pulmonology/Critical Care Pager [phone] or [phone]  05/11/2016 11:37 PM  STAFF NOTE: Linwood Dibbles, MD FACP have personally reviewed patient's available data,  including medical history, events of note, physical examination and test results as part of my evaluation. I have discussed with resident/NP and other care providers such as pharmacist, RN and RRT. In addition, I personally evaluated patient and elicited key findings of: awake, alert, no sig prior med history, no distress, had BRBPR initially, ct with colonic source, MAP goal 60, no pressors currently, cbc noted, consumptive thrombo- received platy and prbc, cbc q6h, s/p IR, gi to call for colono in future timing unclear to me?, tele, may require lactic acid if hemodynamics change, plat if active bleeding noted - so far not noted, chem in am , mag supp, pepcid to remain, keep NPO, I have updated pt in full The patient is critically ill with multiple organ systems failure and requires high complexity decision making for assessment and support, frequent evaluation and titration of therapies, application of advanced monitoring technologies and extensive interpretation of multiple databases.   Critical Care Time devoted to patient care services described in this note is 35 Minutes. This time reflects time of care of this signee: Merrie Roof, MD FACP. This critical care time does not reflect procedure time, or teaching time or supervisory time of PA/NP/Med student/Med Resident etc but could involve care discussion time. Rest per NP/medical resident whose note is outlined above and that I agree with   Lavon Paganini. Titus Mould, MD, Rosalita Chessman  Pgr: [phone] [facility] 05/12/2016 9:56 AM

## 2016-05-11 NOTE — ED Triage Notes (Signed)
Pt arrived via [facility] for c/o gi bleed and syncope - pt stated Sat he started having dark blood in stool - stated when he stood up today he had approx 283ml of blood come out of his rectum

## 2016-05-12 ENCOUNTER — Encounter (HOSPITAL_COMMUNITY): Payer: Self-pay | Admitting: *Deleted

## 2016-05-12 ENCOUNTER — Inpatient Hospital Stay (HOSPITAL_COMMUNITY): Payer: Medicare Other

## 2016-05-12 DIAGNOSIS — R933 Abnormal findings on diagnostic imaging of other parts of digestive tract: Secondary | ICD-10-CM

## 2016-05-12 DIAGNOSIS — M25562 Pain in left knee: Secondary | ICD-10-CM

## 2016-05-12 DIAGNOSIS — D62 Acute posthemorrhagic anemia: Secondary | ICD-10-CM

## 2016-05-12 DIAGNOSIS — K922 Gastrointestinal hemorrhage, unspecified: Secondary | ICD-10-CM

## 2016-05-12 HISTORY — PX: IR GENERIC HISTORICAL: IMG1180011

## 2016-05-12 LAB — TYPE AND SCREEN
ABO/RH(D): O POS
ANTIBODY SCREEN: NEGATIVE
UNIT DIVISION: 0
UNIT DIVISION: 0
UNIT DIVISION: 0
UNIT DIVISION: 0
UNIT DIVISION: 0
UNIT DIVISION: 0
Unit division: 0
Unit division: 0
Unit division: 0
Unit division: 0

## 2016-05-12 LAB — CBC
HCT: 29.2 % — ABNORMAL LOW (ref 39.0–52.0)
HCT: 25.3 % — ABNORMAL LOW (ref 39.0–52.0)
HEMATOCRIT: 27.5 % — AB (ref 39.0–52.0)
HEMATOCRIT: 29.6 % — AB (ref 39.0–52.0)
HEMATOCRIT: 32.8 % — AB (ref 39.0–52.0)
HEMOGLOBIN: 10.2 g/dL — AB (ref 13.0–17.0)
HEMOGLOBIN: 11.1 g/dL — AB (ref 13.0–17.0)
HEMOGLOBIN: 9.6 g/dL — AB (ref 13.0–17.0)
Hemoglobin: 10.3 g/dL — ABNORMAL LOW (ref 13.0–17.0)
Hemoglobin: 8.7 g/dL — ABNORMAL LOW (ref 13.0–17.0)
MCH: 29.2 pg (ref 26.0–34.0)
MCH: 29.3 pg (ref 26.0–34.0)
MCH: 29.4 pg (ref 26.0–34.0)
MCH: 29.7 pg (ref 26.0–34.0)
MCH: 30.1 pg (ref 26.0–34.0)
MCHC: 33.8 g/dL (ref 30.0–36.0)
MCHC: 34.4 g/dL (ref 30.0–36.0)
MCHC: 34.8 g/dL (ref 30.0–36.0)
MCHC: 34.9 g/dL (ref 30.0–36.0)
MCHC: 34.9 g/dL (ref 30.0–36.0)
MCV: 83.8 fL (ref 78.0–100.0)
MCV: 83.9 fL (ref 78.0–100.0)
MCV: 85.5 fL (ref 78.0–100.0)
MCV: 86.1 fL (ref 78.0–100.0)
MCV: 87.7 fL (ref 78.0–100.0)
PLATELETS: 80 10*3/uL — AB (ref 150–400)
Platelets: 41 10*3/uL — ABNORMAL LOW (ref 150–400)
Platelets: 47 10*3/uL — ABNORMAL LOW (ref 150–400)
Platelets: 52 10*3/uL — ABNORMAL LOW (ref 150–400)
Platelets: 68 10*3/uL — ABNORMAL LOW (ref 150–400)
RBC: 2.96 MIL/uL — ABNORMAL LOW (ref 4.22–5.81)
RBC: 3.28 MIL/uL — ABNORMAL LOW (ref 4.22–5.81)
RBC: 3.39 MIL/uL — AB (ref 4.22–5.81)
RBC: 3.53 MIL/uL — ABNORMAL LOW (ref 4.22–5.81)
RBC: 3.74 MIL/uL — ABNORMAL LOW (ref 4.22–5.81)
RDW: 15.5 % (ref 11.5–15.5)
RDW: 16.3 % — ABNORMAL HIGH (ref 11.5–15.5)
RDW: 16.8 % — AB (ref 11.5–15.5)
RDW: 17 % — AB (ref 11.5–15.5)
RDW: 17.4 % — AB (ref 11.5–15.5)
WBC: 15 10*3/uL — ABNORMAL HIGH (ref 4.0–10.5)
WBC: 15.9 10*3/uL — ABNORMAL HIGH (ref 4.0–10.5)
WBC: 19.8 10*3/uL — ABNORMAL HIGH (ref 4.0–10.5)
WBC: 23.4 10*3/uL — ABNORMAL HIGH (ref 4.0–10.5)
WBC: 48.5 10*3/uL — ABNORMAL HIGH (ref 4.0–10.5)

## 2016-05-12 LAB — PATHOLOGIST SMEAR REVIEW

## 2016-05-12 LAB — PREPARE FRESH FROZEN PLASMA
UNIT DIVISION: 0
UNIT DIVISION: 0
Unit division: 0
Unit division: 0
Unit division: 0

## 2016-05-12 LAB — BASIC METABOLIC PANEL
Anion gap: 4 — ABNORMAL LOW (ref 5–15)
BUN: 22 mg/dL — ABNORMAL HIGH (ref 6–20)
CALCIUM: 7.4 mg/dL — AB (ref 8.9–10.3)
CO2: 20 mmol/L — AB (ref 22–32)
CREATININE: 1.21 mg/dL (ref 0.61–1.24)
Chloride: 119 mmol/L — ABNORMAL HIGH (ref 101–111)
GFR calc Af Amer: 59 mL/min — ABNORMAL LOW (ref >60)
GFR calc non Af Amer: 51 mL/min — ABNORMAL LOW (ref >60)
GLUCOSE: 141 mg/dL — AB (ref 65–99)
Potassium: 4.8 mmol/L (ref 3.5–5.1)
Sodium: 143 mmol/L (ref 135–145)

## 2016-05-12 LAB — MRSA PCR SCREENING: MRSA by PCR: NEGATIVE

## 2016-05-12 LAB — PHOSPHORUS
PHOSPHORUS: 5.1 mg/dL — AB (ref 2.5–4.6)
Phosphorus: 4.2 mg/dL (ref 2.5–4.6)

## 2016-05-12 LAB — TROPONIN I
Troponin I: 0.06 ng/mL (ref <0.03)
Troponin I: 0.05 ng/mL (ref <0.03)
Troponin I: 0.06 ng/mL (ref <0.03)
Troponin I: 0.11 ng/mL (ref <0.03)

## 2016-05-12 LAB — MAGNESIUM: Magnesium: 1.6 mg/dL — ABNORMAL LOW (ref 1.7–2.4)

## 2016-05-12 LAB — ABO/RH: ABO/RH(D): O POS

## 2016-05-12 LAB — PREPARE RBC (CROSSMATCH)

## 2016-05-12 MED ORDER — SODIUM CHLORIDE 0.9 % IV SOLN: INTRAVENOUS | Status: AC

## 2016-05-12 MED ORDER — LIDOCAINE HCL 1 % IJ SOLN
INTRAMUSCULAR | Status: DC | PRN
  Administered 2016-05-12: 10 mL

## 2016-05-12 MED ORDER — SODIUM CHLORIDE 0.9 % IV SOLN
10.0000 mg | Freq: Two times a day (BID) | INTRAVENOUS | Status: DC
  Administered 2016-05-12 – 2016-05-14 (×7): 10 mg via INTRAVENOUS
  Filled 2016-05-12 (×10): qty 1

## 2016-05-12 MED ORDER — SODIUM CHLORIDE 0.9 % IJ SOLN
INTRAVENOUS | Status: DC | PRN
  Administered 2016-05-12: 100 ug via INTRA_ARTERIAL

## 2016-05-12 MED ORDER — MIDAZOLAM HCL 2 MG/2ML IJ SOLN
INTRAMUSCULAR | Status: DC | PRN
  Administered 2016-05-11 – 2016-05-12 (×2): 1 mg via INTRAVENOUS

## 2016-05-12 MED ORDER — FENTANYL CITRATE (PF) 100 MCG/2ML IJ SOLN
INTRAMUSCULAR | Status: DC | PRN
  Administered 2016-05-12 (×2): 50 ug via INTRAVENOUS

## 2016-05-12 MED ORDER — MAGNESIUM SULFATE 2 GM/50ML IV SOLN
2.0000 g | Freq: Once | INTRAVENOUS | Status: AC
  Administered 2016-05-12: 2 g via INTRAVENOUS
  Filled 2016-05-12: qty 50

## 2016-05-12 MED ORDER — GELATIN ABSORBABLE 12-7 MM EX MISC
CUTANEOUS | Status: AC
  Filled 2016-05-12: qty 1

## 2016-05-12 MED ORDER — SODIUM CHLORIDE 0.9 % IV SOLN
INTRAVENOUS | Status: AC
  Administered 2016-05-12: 02:00:00 via INTRAVENOUS

## 2016-05-12 MED ORDER — SODIUM CHLORIDE 0.9 % IV SOLN
Freq: Once | INTRAVENOUS | Status: AC
  Administered 2016-05-12: 02:00:00 via INTRAVENOUS

## 2016-05-12 MED ORDER — NITROGLYCERIN 1 MG/10 ML FOR IR/CATH LAB
INTRA_ARTERIAL | Status: AC
  Filled 2016-05-12: qty 20

## 2016-05-12 NOTE — Sedation Documentation (Signed)
Patient is resting comfortably. 

## 2016-05-12 NOTE — Consult Note (Signed)
Referring Provider:  Dr. Titus Mould Primary Care Physician:  Dr. [name], [facility] Primary Gastroenterologist:  Althia Forts   Reason for Consultation:  LGIB  HPI: Dustin Howell is a 82 y.o. male with no significant past medical history taking only a baby aspirin daily. Presented to [facility] ED 10/30 with complaints of lower GI bleeding since 10/28 and new development of syncope on 10/30. In the ED he denied chest pain and shortness of breath. Also in the ED he became hypotensive with blood pressure 65/40. The massive transfusion protocol was activated and he received 4 units of packed red blood cells, 1 unit of FFP, and 1 unit of platelets. He underwent CT angiogram of the abdomen to hopefully localize a source of bleeding, which was identified. He was transferred to [facility] for ICU admission and likely IR embolization.  S/p Gelfoam embolization- distal branch of middle colic artery.  Gelfoam is temporary.  IR recommended GI consult for possible colonoscopy to determine source of bleeding.   He has never had colonoscopy in the past.  No further bleeding at this point.  Says that lower abdomen a little sore since procedure.  Hgb stable currently at 10.3 grams.  No history of bleeding in the past.  History reviewed. No pertinent past medical history.  Past Surgical History:  Procedure Laterality Date  . IR GENERIC HISTORICAL  05/12/2016   IR EMBO ART  VEN HEMORR LYMPH EXTRAV  INC GUIDE ROADMAPPING 05/12/2016 Corrie Mckusick, DO [facility]INTER[facility]  . IR GENERIC HISTORICAL  05/12/2016   IR ANGIOGRAM VISCERAL SELECTIVE 05/12/2016 Corrie Mckusick, DO [facility]  . IR GENERIC HISTORICAL  05/12/2016   IR US GUIDE VASC ACCESS RIGHT 05/12/2016 Corrie Mckusick, DO [facility]  . IR GENERIC HISTORICAL  05/12/2016   IR ANGIOGRAM SELECTIVE EACH ADDITIONAL VESSEL 05/12/2016 Corrie Mckusick, DO [facility]  . IR GENERIC HISTORICAL  05/12/2016   IR ANGIOGRAM SELECTIVE EACH ADDITIONAL VESSEL  05/12/2016 Corrie Mckusick, DO [facility]  . IR GENERIC HISTORICAL  05/12/2016   IR ANGIOGRAM FOLLOW UP STUDY 05/12/2016 Corrie Mckusick, DO [facility]  . IR GENERIC HISTORICAL  05/12/2016   IR ANGIOGRAM SELECTIVE EACH ADDITIONAL VESSEL 05/12/2016 Corrie Mckusick, DO [facility]    Prior to Admission medications   Medication Sig Start Date End Date Taking? Authorizing Provider  aspirin EC 81 MG tablet Take 81 mg by mouth daily.    Yes Historical Provider, MD    Current Facility-Administered Medications  Medication Dose Route Frequency Provider Last Rate Last Dose  . 0.9 %  sodium chloride infusion   Intravenous Continuous Rogue Bussing, MD 125 mL/hr at 05/12/16 1245    . famotidine (PEPCID) 10 mg in sodium chloride 0.9 % 25 mL  10 mg Intravenous Q12H Colcord, MD   10 mg at 05/12/16 [id number]    Allergies as of 05/11/2016  . (No Known Allergies)    History reviewed. No pertinent family history.  Social History   Social History  . Marital status: Married    Spouse name: N/A  . Number of children: N/A  . Years of education: N/A   Occupational History  . Not on file.   Social History Main Topics  . Smoking status: Never Smoker  . Smokeless tobacco: Never Used  . Alcohol use Yes     Comment: every evening with supper  . Drug use: Unknown  . Sexual activity: Not on file   Other Topics Concern  .  Not on file   Social History Narrative  . No narrative on file    Review of Systems: Ten point ROS is O/W negative except as mentioned in HPI.  Physical Exam: Vital signs in last 24 hours: Temp:  [97.2 F (36.2 C)-98.7 F (37.1 C)] 98 F (36.7 C) (10/31 1204) Pulse Rate:  [89-118] 100 (10/31 1300) Resp:  [10-33] 17 (10/31 1300) BP: (58-169)/(34-111) 126/79 (10/31 1300) SpO2:  [97 %-100 %] 100 % (10/31 1300) Weight:  [117 lb 8.1 oz (53.3 kg)-125 lb (56.7 kg)] 117 lb 8.1 oz (53.3 kg) (10/31 0302) Last BM Date: 05/12/16 General:  Alert, Well-developed,  well-nourished, pleasant and cooperative in NAD Head:  Normocephalic and atraumatic. Eyes:  Sclera clear, no icterus.  Conjunctiva pink. Ears:  Normal auditory acuity. Mouth:  No deformity or lesions.   Lungs:  Clear throughout to auscultation.  No wheezes, crackles, or rhonchi.  Heart:  Regular rate and rhythm; no murmurs, clicks, rubs,  or gallops. Abdomen:  Soft, non-distended.  BS present.  Mild right sided TTP. Rectal:  Deferred  Msk:  Symmetrical without gross deformities. Pulses:  Normal pulses noted. Extremities:  Without clubbing or edema. Neurologic:  Alert and oriented x 4;  grossly normal neurologically. Skin:  Intact without significant lesions or rashes. Psych:  Alert and cooperative. Normal mood and affect.  Intake/Output from previous day: 10/30 0701 - 10/31 0700 In: 1257.6 [I.V.:1097.9; IV Piggyback:159.7] Out: 250 [Urine:250] Intake/Output this shift: Total I/O In: 400 [I.V.:375; IV Piggyback:25] Out: -   Lab Results:  Recent Labs  05/11/16 2331 05/12/16 0355 05/12/16 1057  WBC 19.8* 15.9* 15.0*  HGB 11.1* 10.2* 10.3*  HCT 32.8* 29.2* 29.6*  PLT 47* 41* 52*   BMET  Recent Labs  05/11/16 1741 05/12/16 0355  NA 138 143  K 3.5 4.8  CL 111 119*  CO2 23 20*  GLUCOSE 217* 141*  BUN 26* 22*  CREATININE 1.09 1.21  CALCIUM 7.9* 7.4*   LFT  Recent Labs  05/11/16 1741  PROT 5.1*  ALBUMIN 3.2*  AST 22  ALT 9*  ALKPHOS 48  BILITOT 0.5   PT/INR  Recent Labs  05/11/16 1741  LABPROT 15.8*  INR 1.25   Studies/Results: Ir Angiogram Visceral Selective  Result Date: 05/12/2016 INDICATION: 82 year old male with life-threatening lower GI hemorrhage. CT angiogram demonstrates hemorrhage within the distribution the right colic artery/middle colic artery near the hepatic flexure. EXAM: SELECTIVE VISCERAL ARTERIOGRAPHY; IR EMBO ART VEN HEMORR LYMPH EXTRAV INC GUIDE ROADMAPPING; IR ULTRASOUND GUIDANCE VASC ACCESS RIGHT; ADDITIONAL ARTERIOGRAPHY;  ARTERIOGRAPHY MEDICATIONS: 100 mcg nitro, 4 mg IV Zofran. The antibiotic was administered within 1 hour of the procedure ANESTHESIA/SEDATION: Moderate (conscious) sedation was employed during this procedure. A total of Versed 2.0 mg and Fentanyl 100 mcg was administered intravenously. Moderate Sedation Time: 90 minutes. The patient's level of consciousness and vital signs were monitored continuously by radiology nursing throughout the procedure under my direct supervision. CONTRAST:  160 cc Isovue FLUOROSCOPY TIME:  Fluoroscopy Time: 16 minutes 0 seconds (31.3 mGy). COMPLICATIONS: Dustin Howell PROCEDURE: Informed consent was obtained from the patient following explanation of the procedure, risks, benefits and alternatives. The patient understands, agrees and consents for the procedure. All questions were addressed. A time out was performed prior to the initiation of the procedure. Maximal barrier sterile technique utilized including caps, mask, sterile gowns, sterile gloves, large sterile drape, hand hygiene, and Betadine prep. Ultrasound survey of the right inguinal region was performed with images stored and sent to  PACs. A micropuncture needle was used access the right common femoral artery under ultrasound. With excellent arterial blood flow returned, and an .018 micro wire was passed through the needle, observed enter the abdominal aorta under fluoroscopy. The needle was removed, and a micropuncture sheath was placed over the wire. The inner dilator and wire were removed, and an 035 Bentson wire was advanced under fluoroscopy into the abdominal aorta. The sheath was removed and a standard 5 [place] vascular sheath was placed. The dilator was removed and the sheath was flushed. Standard 5 French C2 Cobra catheter was advanced over the [place] wire. Cobra catheter was used to select the superior mesenteric artery. Angiogram was performed. C2 Cobra was exchanged over the [place] wire for [place] catheter. Micro catheter  system was then used through the [place] catheter with 0.016 micro wire and 135cm [place] micro catheter. Right/middle hepatic artery was then selected as the vasculature perfusing the area of interest. Sub selective angiogram performed of branches of the right/ middle artery. Given the significant tortuosity of the vasculature and small caliber, micro catheter was not able to advance to a distal position. Angiogram demonstrated no evidence of active extravasation. 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 Single Gel-Foam pledget infused into target artery of interest, and then withdrawal of the catheter in repeat angiogram demonstrated small additional dysplastic branches at the hepatic flexure. A course Gel-Foam slurry was then infused with no significant filling at the completion. Micro catheter was flushed, withdrawn into the superior mesenteric artery, and then angiogram of the collateral vasculature to the GDA was performed. No perfusion of the areae of interest observed. Limited angiogram of the right common femoral artery performed. Exoseal was deployed for closure. Patient tolerated the procedure well and remained hemodynamically stable throughout. No complications were encountered and no significant blood loss encountered. FINDINGS: Ultrasound survey demonstrates mild atherosclerotic changes of the right common femoral artery which is widely patent. Superior mesenteric artery angiogram demonstrates patent superior mesenteric artery, patent ileal colic artery, patent arcades to the small bowel vasculature. 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 Splenic artery patent, left gastric artery patent, common hepatic artery patent, left hepatic artery and right hepatic artery patent. Large collateral vasculature  contributed to the origin of gastroepiploic artery. Perfusion of the hepatic flexure was predominantly via right colic/middle colic branch. Sub selective angiogram of the colic branches demonstrated no active extravasation or pseudoaneurysm. The knee shunting observed with angio dysplasia. Given the tortuosity and the inability to place the tip of the micro catheter within distal branches, no proximal coil was deployed. Instead, course Gel-Foam embolization of the territory was elected as treatment to decrease the pressure to the territory. Puncture of the right common femoral artery. 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 Tortuosity and small vasculature precluded a distal catheter position into the territory for deposition of metallic coils. Deployment of Exoseal for hemostasis. Signed, Dulcy Fanny. Earleen Newport, DO [facility] Radiology Electronically Signed   By: Corrie Mckusick D.O.   On: 05/12/2016 08:22   Ir Angiogram Selective Each Additional Vessel  Result Date: 05/12/2016 INDICATION: 82 year old male with life-threatening lower GI hemorrhage. CT angiogram demonstrates hemorrhage within the distribution the right colic artery/middle colic artery near the hepatic flexure. EXAM:  SELECTIVE VISCERAL ARTERIOGRAPHY; IR EMBO ART VEN HEMORR LYMPH EXTRAV INC GUIDE ROADMAPPING; IR ULTRASOUND GUIDANCE VASC ACCESS RIGHT; ADDITIONAL ARTERIOGRAPHY; ARTERIOGRAPHY MEDICATIONS: 100 mcg nitro, 4 mg IV Zofran. The antibiotic was administered within 1 hour of the procedure ANESTHESIA/SEDATION: Moderate (conscious) sedation was employed during this procedure. A total of Versed 2.0 mg and Fentanyl 100 mcg was administered intravenously. Moderate Sedation Time: 90 minutes. The patient's level of consciousness and vital signs were monitored  continuously by radiology nursing throughout the procedure under my direct supervision. CONTRAST:  160 cc Isovue FLUOROSCOPY TIME:  Fluoroscopy Time: 16 minutes 0 seconds (31.3 mGy). COMPLICATIONS: Dustin Howell PROCEDURE: Informed consent was obtained from the patient following explanation of the procedure, risks, benefits and alternatives. The patient understands, agrees and consents for the procedure. All questions were addressed. A time out was performed prior to the initiation of the procedure. Maximal barrier sterile technique utilized including caps, mask, sterile gowns, sterile gloves, large sterile drape, hand hygiene, and Betadine prep. Ultrasound survey of the right inguinal region was performed with images stored and sent to PACs. A micropuncture needle was used access the right common femoral artery under ultrasound. With excellent arterial blood flow returned, and an .018 micro wire was passed through the needle, observed enter the abdominal aorta under fluoroscopy. The needle was removed, and a micropuncture sheath was placed over the wire. The inner dilator and wire were removed, and an 035 Bentson wire was advanced under fluoroscopy into the abdominal aorta. The sheath was removed and a standard 5 [place] vascular sheath was placed. The dilator was removed and the sheath was flushed. Standard 5 French C2 Cobra catheter was advanced over the [place] wire. Cobra catheter was used to select the superior mesenteric artery. Angiogram was performed. C2 Cobra was exchanged over the Bentson wire for [place] catheter. Micro catheter system was then used through the [place] catheter with 0.016 micro wire and 135cm Lantern micro catheter. Right/middle hepatic artery was then selected as the vasculature perfusing the area of interest. Sub selective angiogram performed of branches of the right/ middle artery. Given the significant tortuosity of the vasculature and small caliber, micro catheter was not able to advance  to a distal position. Angiogram demonstrated no evidence of active extravasation. 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 Single Gel-Foam pledget infused into target artery of interest, and then withdrawal of the catheter in repeat angiogram demonstrated small additional dysplastic branches at the hepatic flexure. A course Gel-Foam slurry was then infused with no significant filling at the completion. Micro catheter was flushed, withdrawn into the superior mesenteric artery, and then angiogram of the collateral vasculature to the GDA was performed. No perfusion of the areae of interest observed. Limited angiogram of the right common femoral artery performed. Exoseal was deployed for closure. Patient tolerated the procedure well and remained hemodynamically stable throughout. No complications were encountered and no significant blood loss encountered. FINDINGS: Ultrasound survey demonstrates mild atherosclerotic changes of the right common femoral artery which is widely patent. Superior mesenteric artery angiogram demonstrates patent superior mesenteric artery, patent ileal colic artery, patent arcades to the small bowel vasculature. 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 Splenic artery patent, left gastric artery patent, common hepatic artery patent, left hepatic artery and right hepatic artery patent. Large collateral vasculature contributed to the origin of gastroepiploic artery. Perfusion of the hepatic flexure  was predominantly via right colic/middle colic branch. Sub selective angiogram of the colic branches demonstrated no active extravasation or pseudoaneurysm. The knee shunting observed with angio dysplasia. Given the tortuosity and the inability to place the tip of the micro catheter within distal branches, no  proximal coil was deployed. Instead, course Gel-Foam embolization of the territory was elected as treatment to decrease the pressure to the territory. Puncture of the right common femoral artery. 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 Tortuosity and small vasculature precluded a distal catheter position into the territory for deposition of metallic coils. Deployment of Exoseal for hemostasis. Signed, Dulcy Fanny. Earleen Newport, DO [facility] Radiology Electronically Signed   By: Corrie Mckusick D.O.   On: 05/12/2016 08:22   Ir Angiogram Selective Each Additional Vessel  Result Date: 05/12/2016 INDICATION: 82 year old male with life-threatening lower GI hemorrhage. CT angiogram demonstrates hemorrhage within the distribution the right colic artery/middle colic artery near the hepatic flexure. EXAM: SELECTIVE VISCERAL ARTERIOGRAPHY; IR EMBO ART VEN HEMORR LYMPH EXTRAV INC GUIDE ROADMAPPING; IR ULTRASOUND GUIDANCE VASC ACCESS RIGHT; ADDITIONAL ARTERIOGRAPHY; ARTERIOGRAPHY MEDICATIONS: 100 mcg nitro, 4 mg IV Zofran. The antibiotic was administered within 1 hour of the procedure ANESTHESIA/SEDATION: Moderate (conscious) sedation was employed during this procedure. A total of Versed 2.0 mg and Fentanyl 100 mcg was administered intravenously. Moderate Sedation Time: 90 minutes. The patient's level of consciousness and vital signs were monitored continuously by radiology nursing throughout the procedure under my direct supervision. CONTRAST:  160 cc Isovue FLUOROSCOPY TIME:  Fluoroscopy Time: 16 minutes 0 seconds (31.3 mGy). COMPLICATIONS: Dustin Howell PROCEDURE: Informed consent was obtained from the patient following explanation of the procedure, risks, benefits and alternatives. The patient understands, agrees and consents for the procedure. All  questions were addressed. A time out was performed prior to the initiation of the procedure. Maximal barrier sterile technique utilized including caps, mask, sterile gowns, sterile gloves, large sterile drape, hand hygiene, and Betadine prep. Ultrasound survey of the right inguinal region was performed with images stored and sent to PACs. A micropuncture needle was used access the right common femoral artery under ultrasound. With excellent arterial blood flow returned, and an .018 micro wire was passed through the needle, observed enter the abdominal aorta under fluoroscopy. The needle was removed, and a micropuncture sheath was placed over the wire. The inner dilator and wire were removed, and an 035 Bentson wire was advanced under fluoroscopy into the abdominal aorta. The sheath was removed and a standard 5 [place] vascular sheath was placed. The dilator was removed and the sheath was flushed. Standard 5 French C2 Cobra catheter was advanced over the [place] wire. Cobra catheter was used to select the superior mesenteric artery. Angiogram was performed. C2 Cobra was exchanged over the Bentson wire for [place] catheter. Micro catheter system was then used through the [place] catheter with 0.016 micro wire and 135cm [place] micro catheter. Right/middle hepatic artery was then selected as the vasculature perfusing the area of interest. Sub selective angiogram performed of branches of the right/ middle artery. Given the significant tortuosity of the vasculature and small caliber, micro catheter was not able to advance to a distal position. Angiogram demonstrated no evidence of active extravasation. 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 Single Gel-Foam pledget infused into target artery of interest, and then withdrawal of the catheter in repeat angiogram demonstrated small additional dysplastic branches at the  hepatic flexure. A course Gel-Foam slurry was then infused with no significant filling at the  completion. Micro catheter was flushed, withdrawn into the superior mesenteric artery, and then angiogram of the collateral vasculature to the GDA was performed. No perfusion of the areae of interest observed. Limited angiogram of the right common femoral artery performed. Exoseal was deployed for closure. Patient tolerated the procedure well and remained hemodynamically stable throughout. No complications were encountered and no significant blood loss encountered. FINDINGS: Ultrasound survey demonstrates mild atherosclerotic changes of the right common femoral artery which is widely patent. Superior mesenteric artery angiogram demonstrates patent superior mesenteric artery, patent ileal colic artery, patent arcades to the small bowel vasculature. 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 Splenic artery patent, left gastric artery patent, common hepatic artery patent, left hepatic artery and right hepatic artery patent. Large collateral vasculature contributed to the origin of gastroepiploic artery. Perfusion of the hepatic flexure was predominantly via right colic/middle colic branch. Sub selective angiogram of the colic branches demonstrated no active extravasation or pseudoaneurysm. The knee shunting observed with angio dysplasia. Given the tortuosity and the inability to place the tip of the micro catheter within distal branches, no proximal coil was deployed. Instead, course Gel-Foam embolization of the territory was elected as treatment to decrease the pressure to the territory. Puncture of the right common femoral artery. 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 Tortuosity and small  vasculature precluded a distal catheter position into the territory for deposition of metallic coils. Deployment of Exoseal for hemostasis. Signed, Dulcy Fanny. Earleen Newport, DO [facility] Radiology Electronically Signed   By: Corrie Mckusick D.O.   On: 05/12/2016 08:22   Ir Angiogram Selective Each Additional Vessel  Result Date: 05/12/2016 INDICATION: 82 year old male with life-threatening lower GI hemorrhage. CT angiogram demonstrates hemorrhage within the distribution the right colic artery/middle colic artery near the hepatic flexure. EXAM: SELECTIVE VISCERAL ARTERIOGRAPHY; IR EMBO ART VEN HEMORR LYMPH EXTRAV INC GUIDE ROADMAPPING; IR ULTRASOUND GUIDANCE VASC ACCESS RIGHT; ADDITIONAL ARTERIOGRAPHY; ARTERIOGRAPHY MEDICATIONS: 100 mcg nitro, 4 mg IV Zofran. The antibiotic was administered within 1 hour of the procedure ANESTHESIA/SEDATION: Moderate (conscious) sedation was employed during this procedure. A total of Versed 2.0 mg and Fentanyl 100 mcg was administered intravenously. Moderate Sedation Time: 90 minutes. The patient's level of consciousness and vital signs were monitored continuously by radiology nursing throughout the procedure under my direct supervision. CONTRAST:  160 cc Isovue FLUOROSCOPY TIME:  Fluoroscopy Time: 16 minutes 0 seconds (31.3 mGy). COMPLICATIONS: Dustin Howell PROCEDURE: Informed consent was obtained from the patient following explanation of the procedure, risks, benefits and alternatives. The patient understands, agrees and consents for the procedure. All questions were addressed. A time out was performed prior to the initiation of the procedure. Maximal barrier sterile technique utilized including caps, mask, sterile gowns, sterile gloves, large sterile drape, hand hygiene, and Betadine prep. Ultrasound survey of the right inguinal region was performed with images stored and sent to PACs. A micropuncture needle was used access the right common  femoral artery under ultrasound. With excellent arterial blood flow returned, and an .018 micro wire was passed through the needle, observed enter the abdominal aorta under fluoroscopy. The needle was removed, and a micropuncture sheath was placed over  the wire. The inner dilator and wire were removed, and an 035 Bentson wire was advanced under fluoroscopy into the abdominal aorta. The sheath was removed and a standard 5 [place] vascular sheath was placed. The dilator was removed and the sheath was flushed. Standard 5 French C2 Cobra catheter was advanced over the [place] wire. Cobra catheter was used to select the superior mesenteric artery. Angiogram was performed. C2 Cobra was exchanged over the [place]ents[place] wire for [place] catheter. Micro catheter system was then used through the [place] catheter with 0.016 micro wire and 135cm Lantern micro catheter. Right/middle hepatic artery was then selected as the vasculature perfusing the area of interest. Sub selective angiogram performed of branches of the right/ middle artery. Given the significant tortuosity of the vasculature and small caliber, micro catheter was not able to advance to a distal position. Angiogram demonstrated no evidence of active extravasation. 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 Single Gel-Foam pledget infused into target artery of interest, and then withdrawal of the catheter in repeat angiogram demonstrated small additional dysplastic branches at the hepatic flexure. A course Gel-Foam slurry was then infused with no significant filling at the completion. Micro catheter was flushed, withdrawn into the superior mesenteric artery, and then angiogram of the collateral vasculature to the GDA was performed. No perfusion of the areae of interest observed. Limited angiogram of the right common femoral artery performed. Exoseal was deployed for closure. Patient tolerated the procedure well and remained hemodynamically stable throughout. No  complications were encountered and no significant blood loss encountered. FINDINGS: Ultrasound survey demonstrates mild atherosclerotic changes of the right common femoral artery which is widely patent. Superior mesenteric artery angiogram demonstrates patent superior mesenteric artery, patent ileal colic artery, patent arcades to the small bowel vasculature. 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 Splenic artery patent, left gastric artery patent, common hepatic artery patent, left hepatic artery and right hepatic artery patent. Large collateral vasculature contributed to the origin of gastroepiploic artery. Perfusion of the hepatic flexure was predominantly via right colic/middle colic branch. Sub selective angiogram of the colic branches demonstrated no active extravasation or pseudoaneurysm. The knee shunting observed with angio dysplasia. Given the tortuosity and the inability to place the tip of the micro catheter within distal branches, no proximal coil was deployed. Instead, course Gel-Foam embolization of the territory was elected as treatment to decrease the pressure to the territory. Puncture of the right common femoral artery. 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 Tortuosity and small vasculature precluded a distal catheter position into the territory for deposition of metallic coils. Deployment of Exoseal for hemostasis. Signed, Dulcy Fanny. Earleen Newport, DO [facility] Radiology Electronically Signed   By: Corrie Mckusick D.O.   On: 05/12/2016 08:22   Ir Angiogram Follow Up Study  Result Date: 05/12/2016 INDICATION: 82 year old male  with life-threatening lower GI hemorrhage. CT angiogram demonstrates hemorrhage within the distribution the right colic artery/middle colic artery near the hepatic flexure. EXAM: SELECTIVE VISCERAL ARTERIOGRAPHY; IR EMBO ART VEN HEMORR LYMPH EXTRAV INC GUIDE ROADMAPPING; IR ULTRASOUND GUIDANCE VASC ACCESS RIGHT; ADDITIONAL ARTERIOGRAPHY; ARTERIOGRAPHY MEDICATIONS: 100 mcg nitro, 4 mg IV Zofran. The antibiotic was administered within 1 hour of the procedure ANESTHESIA/SEDATION: Moderate (conscious) sedation was employed during this procedure.  A total of Versed 2.0 mg and Fentanyl 100 mcg was administered intravenously. Moderate Sedation Time: 90 minutes. The patient's level of consciousness and vital signs were monitored continuously by radiology nursing throughout the procedure under my direct supervision. CONTRAST:  160 cc Isovue FLUOROSCOPY TIME:  Fluoroscopy Time: 16 minutes 0 seconds (31.3 mGy). COMPLICATIONS: Dustin Howell PROCEDURE: Informed consent was obtained from the patient following explanation of the procedure, risks, benefits and alternatives. The patient understands, agrees and consents for the procedure. All questions were addressed. A time out was performed prior to the initiation of the procedure. Maximal barrier sterile technique utilized including caps, mask, sterile gowns, sterile gloves, large sterile drape, hand hygiene, and Betadine prep. Ultrasound survey of the right inguinal region was performed with images stored and sent to PACs. A micropuncture needle was used access the right common femoral artery under ultrasound. With excellent arterial blood flow returned, and an .018 micro wire was passed through the needle, observed enter the abdominal aorta under fluoroscopy. The needle was removed, and a micropuncture sheath was placed over the wire. The inner dilator and wire were removed, and an 035 Bentson wire was advanced under fluoroscopy into the abdominal aorta. The sheath was removed and a  standard 5 [place] vascular sheath was placed. The dilator was removed and the sheath was flushed. Standard 5 French C2 Cobra catheter was advanced over the [place] wire. Cobra catheter was used to select the superior mesenteric artery. Angiogram was performed. C2 Cobra was exchanged over the [place] wire for [place] catheter. Micro catheter system was then used through the [place] catheter with 0.016 micro wire and 135cm Lantern micro catheter. Right/middle hepatic artery was then selected as the vasculature perfusing the area of interest. Sub selective angiogram performed of branches of the right/ middle artery. Given the significant tortuosity of the vasculature and small caliber, micro catheter was not able to advance to a distal position. Angiogram demonstrated no evidence of active extravasation. 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 Single Gel-Foam pledget infused into target artery of interest, and then withdrawal of the catheter in repeat angiogram demonstrated small additional dysplastic branches at the hepatic flexure. A course Gel-Foam slurry was then infused with no significant filling at the completion. Micro catheter was flushed, withdrawn into the superior mesenteric artery, and then angiogram of the collateral vasculature to the GDA was performed. No perfusion of the areae of interest observed. Limited angiogram of the right common femoral artery performed. Exoseal was deployed for closure. Patient tolerated the procedure well and remained hemodynamically stable throughout. No complications were encountered and no significant blood loss encountered. FINDINGS: Ultrasound survey demonstrates mild atherosclerotic changes of the right common femoral artery which is widely patent. Superior mesenteric artery angiogram demonstrates patent superior mesenteric artery, patent ileal colic artery, patent arcades to the small bowel vasculature. 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 Splenic artery patent, left gastric artery patent, common hepatic artery patent, left hepatic artery and right hepatic artery patent. Large collateral vasculature contributed to the origin of gastroepiploic artery. Perfusion of the hepatic flexure was predominantly via right colic/middle colic branch. Sub selective angiogram of the colic branches demonstrated no active extravasation or pseudoaneurysm. The knee shunting observed with angio dysplasia. Given the tortuosity and the inability to place the tip of the micro catheter within distal branches, no proximal coil was deployed.  Instead, course Gel-Foam embolization of the territory was elected as treatment to decrease the pressure to the territory. Puncture of the right common femoral artery. 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 Tortuosity and small vasculature precluded a distal catheter position into the territory for deposition of metallic coils. Deployment of Exoseal for hemostasis. Signed, Dulcy Fanny. Earleen Newport, DO [facility] Radiology Electronically Signed   By: Corrie Mckusick D.O.   On: 05/12/2016 08:22   Ir US Guide Vasc Access Right  Result Date: 05/12/2016 INDICATION: 82 year old male with life-threatening lower GI hemorrhage. CT angiogram demonstrates hemorrhage within the distribution the right colic artery/middle colic artery near the hepatic flexure. EXAM: SELECTIVE VISCERAL ARTERIOGRAPHY; IR EMBO ART VEN HEMORR LYMPH EXTRAV INC GUIDE ROADMAPPING; IR ULTRASOUND GUIDANCE VASC ACCESS RIGHT; ADDITIONAL ARTERIOGRAPHY; ARTERIOGRAPHY MEDICATIONS: 100 mcg nitro, 4 mg IV Zofran. The antibiotic  was administered within 1 hour of the procedure ANESTHESIA/SEDATION: Moderate (conscious) sedation was employed during this procedure. A total of Versed 2.0 mg and Fentanyl 100 mcg was administered intravenously. Moderate Sedation Time: 90 minutes. The patient's level of consciousness and vital signs were monitored continuously by radiology nursing throughout the procedure under my direct supervision. CONTRAST:  160 cc Isovue FLUOROSCOPY TIME:  Fluoroscopy Time: 16 minutes 0 seconds (31.3 mGy). COMPLICATIONS: Dustin Howell PROCEDURE: Informed consent was obtained from the patient following explanation of the procedure, risks, benefits and alternatives. The patient understands, agrees and consents for the procedure. All questions were addressed. A time out was performed prior to the initiation of the procedure. Maximal barrier sterile technique utilized including caps, mask, sterile gowns, sterile gloves, large sterile drape, hand hygiene, and Betadine prep. Ultrasound survey of the right inguinal region was performed with images stored and sent to PACs. A micropuncture needle was used access the right common femoral artery under ultrasound. With excellent arterial blood flow returned, and an .018 micro wire was passed through the needle, observed enter the abdominal aorta under fluoroscopy. The needle was removed, and a micropuncture sheath was placed over the wire. The inner dilator and wire were removed, and an 035 B[place] wire was advanced under fluoroscopy into the abdominal aorta. The sheath was removed and a standard 5 [place] vascular sheath was placed. The dilator was removed and the sheath was flushed. Standard 5 French C2 Cobra catheter was advanced over the [place] wire. Cobra catheter was used to select the superior mesenteric artery. Angiogram was performed. C2 Cobra was exchanged over the [place] wire for [place] catheter. Micro catheter system was then used through the [place] catheter with 0.016 micro wire  and 135cm Lantern micro catheter. Right/middle hepatic artery was then selected as the vasculature perfusing the area of interest. Sub selective angiogram performed of branches of the right/ middle artery. Given the significant tortuosity of the vasculature and small caliber, micro catheter was not able to advance to a distal position. Angiogram demonstrated no evidence of active extravasation. 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 Single Gel-Foam pledget infused into target artery of interest, and then withdrawal of the catheter in repeat angiogram demonstrated small additional dysplastic branches at the hepatic flexure. A course Gel-Foam slurry was then infused with no significant filling at the completion. Micro catheter was flushed, withdrawn into the superior mesenteric artery, and then angiogram of the collateral vasculature to the GDA was performed. No perfusion of the areae of interest observed. Limited angiogram of  the right common femoral artery performed. Exoseal was deployed for closure. Patient tolerated the procedure well and remained hemodynamically stable throughout. No complications were encountered and no significant blood loss encountered. FINDINGS: Ultrasound survey demonstrates mild atherosclerotic changes of the right common femoral artery which is widely patent. Superior mesenteric artery angiogram demonstrates patent superior mesenteric artery, patent ileal colic artery, patent arcades to the small bowel vasculature. 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 Splenic artery patent, left gastric artery patent, common hepatic artery patent, left hepatic artery and right hepatic artery patent. Large collateral vasculature contributed to the origin of gastroepiploic artery. Perfusion of the hepatic  flexure was predominantly via right colic/middle colic branch. Sub selective angiogram of the colic branches demonstrated no active extravasation or pseudoaneurysm. The knee shunting observed with angio dysplasia. Given the tortuosity and the inability to place the tip of the micro catheter within distal branches, no proximal coil was deployed. Instead, course Gel-Foam embolization of the territory was elected as treatment to decrease the pressure to the territory. Puncture of the right common femoral artery. 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 Tortuosity and small vasculature precluded a distal catheter position into the territory for deposition of metallic coils. Deployment of Exoseal for hemostasis. Signed, Dulcy Fanny. Earleen Newport, DO [facility] Radiology Electronically Signed   By: Corrie Mckusick D.O.   On: 05/12/2016 08:22   Dg Knee Left Port  Result Date: 05/12/2016 CLINICAL DATA:  Recent fall.  Pain . EXAM: PORTABLE LEFT KNEE - 1-2 VIEW COMPARISON:  No recent prior. FINDINGS: Mild tricompartment degenerative change. No acute bony or joint abnormality identified. No evidence of fracture or dislocation. IMPRESSION: Mild tricompartment degenerative change.  No acute abnormality Electronically Signed   By: Marcello Moores  Register   On: 05/12/2016 07:32   Ir Ileana Ladd Art  [place] Guide Roadmapping  Result Date: 05/12/2016 INDICATION: 82 year old male with life-threatening lower GI hemorrhage. CT angiogram demonstrates hemorrhage within the distribution the right colic artery/middle colic artery near the hepatic flexure. EXAM: SELECTIVE VISCERAL ARTERIOGRAPHY; IR EMBO ART VEN HEMORR LYMPH EXTRAV INC GUIDE ROADMAPPING; IR ULTRASOUND GUIDANCE VASC ACCESS RIGHT; ADDITIONAL ARTERIOGRAPHY; ARTERIOGRAPHY  MEDICATIONS: 100 mcg nitro, 4 mg IV Zofran. The antibiotic was administered within 1 hour of the procedure ANESTHESIA/SEDATION: Moderate (conscious) sedation was employed during this procedure. A total of Versed 2.0 mg and Fentanyl 100 mcg was administered intravenously. Moderate Sedation Time: 90 minutes. The patient's level of consciousness and vital signs were monitored continuously by radiology nursing throughout the procedure under my direct supervision. CONTRAST:  160 cc Isovue FLUOROSCOPY TIME:  Fluoroscopy Time: 16 minutes 0 seconds (31.3 mGy). COMPLICATIONS: Dustin Howell PROCEDURE: Informed consent was obtained from the patient following explanation of the procedure, risks, benefits and alternatives. The patient understands, agrees and consents for the procedure. All questions were addressed. A time out was performed prior to the initiation of the procedure. Maximal barrier sterile technique utilized including caps, mask, sterile gowns, sterile gloves, large sterile drape, hand hygiene, and Betadine prep. Ultrasound survey of the right inguinal region was performed with images stored and sent to PACs. A micropuncture needle was used access the right common femoral artery under ultrasound. With excellent arterial blood flow returned, and an .  018 micro wire was passed through the needle, observed enter the abdominal aorta under fluoroscopy. The needle was removed, and a micropuncture sheath was placed over the wire. The inner dilator and wire were removed, and an 035 B[place] wire was advanced under fluoroscopy into the abdominal aorta. The sheath was removed and a standard 5 [place] vascular sheath was placed. The dilator was removed and the sheath was flushed. Standard 5 French C2 Cobra catheter was advanced over the [place] wire. Cobra catheter was used to select the superior mesenteric artery. Angiogram was performed. C2 Cobra was exchanged over the [place] wire for [place] catheter. Micro catheter system was  then used through the [place] catheter with 0.016 micro wire and 135cm Lan[place] micro catheter. Right/middle hepatic artery was then selected as the vasculature perfusing the area of interest. Sub selective angiogram performed of branches of the right/ middle artery. Given the significant tortuosity of the vasculature and small caliber, micro catheter was not able to advance to a distal position. Angiogram demonstrated no evidence of active extravasation. 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 Single Gel-Foam pledget infused into target artery of interest, and then withdrawal of the catheter in repeat angiogram demonstrated small additional dysplastic branches at the hepatic flexure. A course Gel-Foam slurry was then infused with no significant filling at the completion. Micro catheter was flushed, withdrawn into the superior mesenteric artery, and then angiogram of the collateral vasculature to the GDA was performed. No perfusion of the areae of interest observed. Limited angiogram of the right common femoral artery performed. Exoseal was deployed for closure. Patient tolerated the procedure well and remained hemodynamically stable throughout. No complications were encountered and no significant blood loss encountered. FINDINGS: Ultrasound survey demonstrates mild atherosclerotic changes of the right common femoral artery which is widely patent. Superior mesenteric artery angiogram demonstrates patent superior mesenteric artery, patent ileal colic artery, patent arcades to the small bowel vasculature. 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 Splenic artery patent, left gastric artery patent, common hepatic artery patent, left hepatic artery and right hepatic artery patent. Large collateral vasculature contributed to the  origin of gastroepiploic artery. Perfusion of the hepatic flexure was predominantly via right colic/middle colic branch. Sub selective angiogram of the colic branches demonstrated no active extravasation or pseudoaneurysm. The knee shunting observed with angio dysplasia. Given the tortuosity and the inability to place the tip of the micro catheter within distal branches, no proximal coil was deployed. Instead, course Gel-Foam embolization of the territory was elected as treatment to decrease the pressure to the territory. Puncture of the right common femoral artery. 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 Tortuosity and small vasculature precluded a distal catheter position into the territory for deposition of metallic coils. Deployment of Exoseal for hemostasis. Signed, Dulcy Fanny. Earleen Newport, DO [facility] Radiology Electronically Signed   By: Corrie Mckusick D.O.   On: 05/12/2016 08:22   Ct Angio Abd/pel W And/or Wo Contrast  Result Date: 05/11/2016 CLINICAL DATA:  Acute onset of syncope and bright red rectal bleeding. Lightheadedness and generalized weakness. Initial encounter. EXAM: CTA ABDOMEN AND PELVIS wITHOUT AND WITH CONTRAST TECHNIQUE: Multidetector CT imaging of the abdomen and pelvis was performed using the standard protocol during bolus administration of intravenous  contrast. Multiplanar reconstructed images and MIPs were obtained and reviewed to evaluate the vascular anatomy. CONTRAST:  75 mL of Isovue 370 IV contrast COMPARISON:  Dustin Howell. FINDINGS: VASCULAR Aorta: Scattered calcification is seen along the abdominal aorta, without significant luminal narrowing. 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 SMA: The superior mesenteric artery  appears patent, without evidence of luminal narrowing. Renals: Mild calcification is noted at the proximal renal arteries bilaterally, without significant luminal narrowing. IMA: The inferior mesenteric artery remains patent. Inflow: Scattered calcification is seen along the common and internal iliac arteries. The external iliac arteries and common femoral arteries remain fully patent bilaterally, with mild calcification at the right common femoral artery. Proximal Outflow: Minimal mural thrombus is suggested at the proximal left superficial femoral artery. Minimal mural thrombus is suggested at the proximal right profunda femoris artery. Veins: Visualized venous structures are grossly unremarkable. The inferior vena cava is partially decompressed and unremarkable in appearance. Review of the MIP images confirms the above findings. NON-VASCULAR Lower chest: Mild bibasilar atelectasis or scarring is noted. Diffuse coronary artery calcifications are seen. The visualized portions of the mediastinum are otherwise unremarkable. Hepatobiliary: The liver is unremarkable in appearance. The gallbladder is unremarkable in appearance. The common bile duct remains normal in caliber. Pancreas: The pancreas is within normal limits. Spleen: The spleen is unremarkable in appearance. Adrenals/Urinary Tract: The adrenal glands are unremarkable in appearance. A large 7.5 cm cyst is noted at the upper pole of the left kidney. Mild nonspecific perinephric stranding is noted bilaterally. Additional scattered small bilateral renal cysts are seen. Mild left-sided renal pelvicaliectasis remains within normal limits, without evidence of significant hydronephrosis. No renal or ureteral stones are identified. Stomach/Bowel: The stomach is unremarkable in appearance. The small bowel is within normal limits. The appendix is not visualized; there is no evidence for appendicitis. Scattered diverticulosis is noted along the proximal sigmoid colon,  without evidence of diverticulitis. There is a large amount of acute extravasation of contrast at the ascending colon, just proximal to the hepatic flexure of the colon, compatible with active intraluminal hemorrhage. This explains the patient's bright red blood per rectum. Lymphatic: Retroperitoneal nodes are grossly unremarkable in appearance. No pelvic sidewall lymphadenopathy is appreciated. Reproductive: The bladder is mildly distended and grossly unremarkable. The prostate remains normal in size. Other: No additional soft tissue abnormalities are seen. Musculoskeletal: No acute osseous abnormalities are identified. Vacuum phenomenon is noted at L5-S1. Underlying facet disease is noted. The visualized musculature is unremarkable in appearance. IMPRESSION: VASCULAR 1. Large amount of acute extravasation of contrast at the ascending colon, just proximal to the hepatic flexure of the colon, compatible with active intraluminal hemorrhage. This explains the patient's bright red blood per rectum. 2. Scattered aortic atherosclerosis noted. 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 3. Diffuse coronary artery calcifications seen. NON-VASCULAR 1. Mild bibasilar atelectasis or scarring noted. 2. Scattered bilateral renal cysts, measuring up to 7.5 cm on the left. 3. Scattered diverticulosis along the proximal sigmoid colon, without evidence of diverticulitis. These results were called by telephone at the time of interpretation on 05/11/2016 at 9:58 pm to Dr. Carrie Mew, who verbally acknowledged these results. Electronically Signed   By: Garald Balding M.D.   On: 05/11/2016 22:04   IMPRESSION:  -ABLA secondary to massive LGIB (4 units PRBC, 1FFP, 1plt at [facility]) s/p IR embolization with GelFoam.   -Thrombocytopenia:  Likely due to consumption.   PLAN: -  Will plan for colonoscopy with Dr. Fuller Plan on Thursday, 11/2 with MAC sedation.  Patient is  agreeable.  ? If he signs his own consents. -Ok for clear liquids from GI standpoint if ok with IR. -Monitor CBC, transfuse further prn.  ZEHR, JESSICA D.  05/12/2016, 1:18 PM  Pager number [phone]    Attending physician's note   I have taken a history, examined the patient and reviewed the chart. I agree with the Advanced Practitioner's note, impression and recommendations. 82 year old male followed at [facility] who was transferred from [facility] ED yesterday with major LGI bleed and syncope. 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 He underwent IR visceral angiogram with GelFoam embolization of distal branch of middle colic artery last night. Recommend colonoscopy on Thursday to further evaluate for the cause of bleeding. Pt agrees to colonoscopy.   Dustin Edward, MD [facility] [phone] Mon-Fri 8a-5p [phone] after 5p, weekends, holidays

## 2016-05-12 NOTE — Progress Notes (Signed)
[place] Progress Note Patient Name: Dustin Howell DOB: October 16, 1924 MRN: [id number]   Date of Service  05/12/2016  HPI/Events of Note  Nurse calls with troponin result.  Troponin was 0.06, initially was 0.03.  Patient is comfortable, not in distress. Denies chest pain.  105/59, heart rate 97, respiratory rate 16, O2 saturation 99%.   Telemetry reviewed. Sinus rhythm. No ST-T wave changes seen.   Baseline EKG revealed.   Patient admitted with hypovolemic shock from acid GI bleed.   eICU Interventions  Likely demand ischemia.  Continue to observe for now. We'll hold off on heparin / asa 2/2 recent massive GI bleed.  Check troponin in 6 hours.         [place] 05/12/2016, 5:21 AM

## 2016-05-12 NOTE — Progress Notes (Signed)
eLink Physician-Brief Progress Note Patient Name: Dustin Howell DOB: [birth year] MRN: [id number]   Date of Service  05/12/2016  HPI/Events of Note  Pt returns back from IR.  No further bleeding noted. BP is soft 90/60, HR 90, RR 20, 99%. Pt seen comfortable. (-) subj complaints.  Has only gotten 1 L at [facility].   eICU Interventions  Will bolus 500 ml saline then cont with maintenance IVF.  Getting labs. Getting cbc q 6.  Observe BP.      Intervention Category Major Interventions: Hypotension - evaluation and management  [place] 05/12/2016, 2:14 AM

## 2016-05-12 NOTE — Procedures (Signed)
Interventional Radiology Procedure Note  Procedure:  Mesenteric angiogram, SMA, GDA, right colic/middle colic.  Gelfoam embolization of distal branch of middle colic artery at the hepatic flexure  Findings:  No extravasation identified.  No pseudoaneurysm.  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   Tortuosity of the middle colic artery precluded distal placement of the catheter tip, thus a course gel-foam embolization was performed.  No further direct filling of the target vessels at conclusion.   Closure of the R CFA puncture with Exoseal.  .  Complications: None Recommendations:  - Agree with ICU care - Agree with serial H&H - When able, would recommend colonoscopy to determine site of hemorrhage.  The gelfoam is temporary, selected because of the relative proximal catheter position, and without GI/surgical eval may be at risk of hemorrhage in the future.  - Right leg straight for 6 hours - IV hydration x 6 hours for renal protection - VIR will follow   Signed,  Dulcy Fanny. Earleen Newport, DO

## 2016-05-12 NOTE — Progress Notes (Signed)
[place] Progress Note Patient Name: Dustin Howell DOB: [birth year] MRN: [id number]   Date of Service  05/12/2016  HPI/Events of Note  Low Mg  eICU Interventions  Replete Mg     Intervention Category Intermediate Interventions: Other:  [place] 05/12/2016, 5:35 AM

## 2016-05-12 NOTE — Progress Notes (Addendum)
PULMONARY / CRITICAL CARE MEDICINE   Name: Dustin Howell MRN: [id number] DOB: 06-08-1925    ADMISSION DATE:  05/11/2016 CONSULTATION DATE:  05/11/2016  REFERRING MD:  Dr. Joni Fears, [place] [facility]  CHIEF COMPLAINT:  GI bleeding  HISTORY OF PRESENT ILLNESS:   82 year old male with no significant past medical history taking only a baby aspirin daily. Presented to [facility] emergency department 10/30 with complaints of lower GI bleeding since 10/28 and new development of syncope on 10/30. In the emergency department he denied chest pain and shortness of breath. Also in the emergency department he became hypotensive with blood pressure 65/40. The massive transfusion protocol was activated and he received 4 units of packed red blood cells, 1 unit of FFP, and 1 unit of platelets. He underwent CT angiogram of the abdomen to hopefully localize a source of bleeding, which was identified. He was transferred to [facility] for ICU admission and likely IR embolization on 10/30.  SUBJECTIVE:  Noted to have soft blood pressures after IR procedure but without further bleeding. Bolused (500cc) and continued on IVmF. Noted to have mild troponin elevation, but patient asymptomatic. EKG was at baseline. Likely demand ischemia.   VITAL SIGNS: BP 105/65   Pulse 96   Temp 98 F (36.7 C) (Oral)   Resp 15   Ht 5\' 7"  (1.702 m)   Wt 53.3 kg (117 lb 8.1 oz)   SpO2 99%   BMI 18.40 kg/m   HEMODYNAMICS:    VENTILATOR SETTINGS:    INTAKE / OUTPUT: No intake/output data recorded.  PHYSICAL EXAMINATION: General:  NAD, Neuro: no focal deficits CN2-12 intact, strength intact/equal in all extremities, normal sensation to light touch in all extremities, awake and alert.  HEENT:  PERRL Cardiovascular:  Tachycardia, normal S1 and S2, no murmur, rubs, or gallops Lungs: normal effort, CTAB Abdomen: soft, NT, ND, + Bowel sounds  Musculoskeletal:  Moves all extremities spontaneously.  Skin: no rash/bruising  noted in exposed skin   LABS:  BMET  Recent Labs Lab 05/11/16 1741 05/12/16 0355  NA 138 143  K 3.5 4.8  CL 111 119*  CO2 23 20*  BUN 26* 22*  CREATININE 1.09 1.21  GLUCOSE 217* 141*    Electrolytes  Recent Labs Lab 05/11/16 1741 05/12/16 0355  CALCIUM 7.9* 7.4*  MG  --  1.6*  PHOS  --  4.2    CBC  Recent Labs Lab 05/11/16 1741 05/11/16 2331 05/12/16 0355  WBC 16.5* 19.8* 15.9*  HGB 7.8* 11.1* 10.2*  HCT 23.6* 32.8* 29.2*  PLT 114* 47* 41*    Coag's  Recent Labs Lab 05/11/16 1741  INR 1.25    Sepsis Markers No results for input(s): LATICACIDVEN, PROCALCITON, O2SATVEN in the last 168 hours.  ABG No results for input(s): PHART, PCO2ART, PO2ART in the last 168 hours.  Liver Enzymes  Recent Labs Lab 05/11/16 1741  AST 22  ALT 9*  ALKPHOS 48  BILITOT 0.5  ALBUMIN 3.2*    Cardiac Enzymes  Recent Labs Lab 05/11/16 1741 05/12/16 0355  TROPONINI <0.03 0.06*    Glucose  Recent Labs Lab 05/11/16 2244  GLUCAP 143*    Imaging Ct Angio Abd/pel W And/or Wo Contrast  Result Date: 05/11/2016 CLINICAL DATA:  Acute onset of syncope and bright red rectal bleeding. Lightheadedness and generalized weakness. Initial encounter. EXAM: CTA ABDOMEN AND PELVIS wITHOUT AND WITH CONTRAST TECHNIQUE: Multidetector CT imaging of the abdomen and pelvis was performed using the standard protocol during bolus administration of  intravenous contrast. Multiplanar reconstructed images and MIPs were obtained and reviewed to evaluate the vascular anatomy. CONTRAST:  75 mL of Isovue 370 IV contrast COMPARISON:  None. FINDINGS: VASCULAR Aorta: Scattered calcification is seen along the abdominal aorta, without significant luminal narrowing. 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 SMA: The superior mesenteric artery appears patent, without evidence of luminal narrowing. Renals: Mild calcification  is noted at the proximal renal arteries bilaterally, without significant luminal narrowing. IMA: The inferior mesenteric artery remains patent. Inflow: Scattered calcification is seen along the common and internal iliac arteries. The external iliac arteries and common femoral arteries remain fully patent bilaterally, with mild calcification at the right common femoral artery. Proximal Outflow: Minimal mural thrombus is suggested at the proximal left superficial femoral artery. Minimal mural thrombus is suggested at the proximal right profunda femoris artery. Veins: Visualized venous structures are grossly unremarkable. The inferior vena cava is partially decompressed and unremarkable in appearance. Review of the MIP images confirms the above findings. NON-VASCULAR Lower chest: Mild bibasilar atelectasis or scarring is noted. Diffuse coronary artery calcifications are seen. The visualized portions of the mediastinum are otherwise unremarkable. Hepatobiliary: The liver is unremarkable in appearance. The gallbladder is unremarkable in appearance. The common bile duct remains normal in caliber. Pancreas: The pancreas is within normal limits. Spleen: The spleen is unremarkable in appearance. Adrenals/Urinary Tract: The adrenal glands are unremarkable in appearance. A large 7.5 cm cyst is noted at the upper pole of the left kidney. Mild nonspecific perinephric stranding is noted bilaterally. Additional scattered small bilateral renal cysts are seen. Mild left-sided renal pelvicaliectasis remains within normal limits, without evidence of significant hydronephrosis. No renal or ureteral stones are identified. Stomach/Bowel: The stomach is unremarkable in appearance. The small bowel is within normal limits. The appendix is not visualized; there is no evidence for appendicitis. Scattered diverticulosis is noted along the proximal sigmoid colon, without evidence of diverticulitis. There is a large amount of acute extravasation  of contrast at the ascending colon, just proximal to the hepatic flexure of the colon, compatible with active intraluminal hemorrhage. This explains the patient's bright red blood per rectum. Lymphatic: Retroperitoneal nodes are grossly unremarkable in appearance. No pelvic sidewall lymphadenopathy is appreciated. Reproductive: The bladder is mildly distended and grossly unremarkable. The prostate remains normal in size. Other: No additional soft tissue abnormalities are seen. Musculoskeletal: No acute osseous abnormalities are identified. Vacuum phenomenon is noted at L5-S1. Underlying facet disease is noted. The visualized musculature is unremarkable in appearance. IMPRESSION: VASCULAR 1. Large amount of acute extravasation of contrast at the ascending colon, just proximal to the hepatic flexure of the colon, compatible with active intraluminal hemorrhage. This explains the patient's bright red blood per rectum. 2. Scattered aortic atherosclerosis noted. 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 3. Diffuse coronary artery calcifications seen. NON-VASCULAR 1. Mild bibasilar atelectasis or scarring noted. 2. Scattered bilateral renal cysts, measuring up to 7.5 cm on the left. 3. Scattered diverticulosis along the proximal sigmoid colon, without evidence of diverticulitis. These results were called by telephone at the time of interpretation on 05/11/2016 at 9:58 pm to Dr. Carrie Mew, who verbally acknowledged these results. Electronically Signed   By: Garald Balding M.D.   On: 05/11/2016 22:04    STUDIES:  10/30 CTA abd/pelvis > Large amount of acute extravasation of contrast at the ascending colon, just proximal to the hepatic flexure of the colon, compatible with  active intraluminal hemorrhage. This explains the patient's bright red blood per rectum. Scattered aortic atherosclerosis noted. Severe proximal luminal narrowing noted at the celiac  trunk, likely reflecting underlying mural thrombus. Mild bibasilar atelectasis or scarring noted. Scattered bilateral renal cysts, measuring up to 7.5 cm on the left. Scattered diverticulosis along the proximal sigmoid colon, without evidence of diverticulitis.  CULTURES: none  ANTIBIOTICS: none   SIGNIFICANT EVENTS: 10/30 Admit for GI bleed and hypotension 10/31: Gelfoam embolization of distal branch of middle colic artery at the hepatic flexure  LINES/TUBES: PIV x 3   DISCUSSION: 82 year old male with no significant medical history presenting with acute lower GI bleeding with source identified on CT angiogram; required blood products. S/p IT embolization (with gelfoam) with no further bleeding.   ASSESSMENT / PLAN:  GASTROINTESTINAL A:   GIB s/p Gelfoam embolization- distal branch of middle colic artery ([id number])   P:   NPO See heme Pepcid IV BID for SUP IR consulted and following: when able would recommend colonoscopy to determine site of hemorrhage. Gelfoam is temporary. Will likely need to consult GI at this time.   HEMATOLOGIC A:   ABLA secondary to GIB (4 units PRBC, 1FFP, 1plt at [facility]) Thrombocytopenia: likely due to consumption  Leukocytosis: afebrile. Likely de-marginalization in the setting of acute stress P:  Typed and screened Transfuse to keep Hgb > 8 in setting active bleed Coags OK  PULMONARY A: No acute issues  P:   Monitor   CARDIOVASCULAR A:  Hypotension related to GIB: improved with IVF  Mild Troponin Elevation: likely demand in the setting of acute GIB  P:  Telemetry Blood products as needed EKG non-ischemic, Trend troponin Hold home ASA  RENAL A:   Hypomagnesemia   P:   Follow BMP Replete with Mag 2 g x1   INFECTIOUS A:   No acute issues  P:   monitor  ENDOCRINE A:   No acute issues  P:   monitor  NEUROLOGIC A:   Slurred speech: slight but reports this is baseline. No focal neurological deficits  noted on exam.   P:   Unlikely that CT head is indicated currently   MSK A: L knee pain : resolved this AM. No history of trauma.  P: X ray L knee without acute process   FAMILY  - Updates:   - Inter-disciplinary family meet or Palliative Care meeting due by:  11/6   Smiley Houseman, MD PGY 2 Family Medicine   STAFF NOTE: I, Merrie Roof, MD FACP have personally reviewed patient's available data, including medical history, events of note, physical examination and test results as part of my evaluation. I have discussed with resident/NP and other care providers such as pharmacist, RN and RRT. In addition, I personally evaluated patient and elicited key findings of: awake, alert, NO further bleeding noted after IR embolization, cbc now and q6h, plat count to follow, hold furtehr plat tx, NPO, GI consult for definitive scope given Gel foam used, to sdu if remains stable hemodyancmis and hct, his neuro exam is nromal cn intact, strength equal To triad likely  Lavon Paganini. Titus Mould, MD, [place] Pgr: [facility] 05/12/2016 10:00 AM

## 2016-05-12 NOTE — Progress Notes (Signed)
Pt arrived from [facility] via [facility] at 22:20. On route patient became tachycardic with heart rates in the 130s and hypotensive with SBP in the 70s. 1 units of RBCs was infused totaling 7 units given. [facility] also gave calcium gluconate. Patient arrived to the unit alert and oriented, a blood pressure of 126/111 and a heart rate of 118. Patient had a large bowel movement consisting of bright red blood and large clots. Daughter and radiologist at bedside. Radiologist discussed plan and identified risks of procedure. CCM NP also at bedside to assess.

## 2016-05-13 ENCOUNTER — Inpatient Hospital Stay (HOSPITAL_COMMUNITY): Payer: Medicare Other

## 2016-05-13 DIAGNOSIS — R1084 Generalized abdominal pain: Secondary | ICD-10-CM

## 2016-05-13 DIAGNOSIS — R109 Unspecified abdominal pain: Secondary | ICD-10-CM

## 2016-05-13 DIAGNOSIS — I959 Hypotension, unspecified: Secondary | ICD-10-CM

## 2016-05-13 DIAGNOSIS — D72829 Elevated white blood cell count, unspecified: Secondary | ICD-10-CM

## 2016-05-13 LAB — BASIC METABOLIC PANEL
ANION GAP: 9 (ref 5–15)
Anion gap: 9 (ref 5–15)
BUN: 38 mg/dL — ABNORMAL HIGH (ref 6–20)
BUN: 42 mg/dL — ABNORMAL HIGH (ref 6–20)
CALCIUM: 7.9 mg/dL — AB (ref 8.9–10.3)
CALCIUM: 7.8 mg/dL — AB (ref 8.9–10.3)
CHLORIDE: 113 mmol/L — AB (ref 101–111)
CO2: 15 mmol/L — ABNORMAL LOW (ref 22–32)
CO2: 16 mmol/L — AB (ref 22–32)
CREATININE: 1.95 mg/dL — AB (ref 0.61–1.24)
Chloride: 117 mmol/L — ABNORMAL HIGH (ref 101–111)
Creatinine, Ser: 2 mg/dL — ABNORMAL HIGH (ref 0.61–1.24)
GFR calc non Af Amer: 29 mL/min — ABNORMAL LOW (ref >60)
GFR calc non Af Amer: 28 mL/min — ABNORMAL LOW (ref >60)
GFR, EST AFRICAN AMERICAN: 33 mL/min — AB (ref >60)
GFR, EST AFRICAN AMERICAN: 32 mL/min — AB (ref >60)
GLUCOSE: 181 mg/dL — AB (ref 65–99)
Glucose, Bld: 137 mg/dL — ABNORMAL HIGH (ref 65–99)
Potassium: 3.9 mmol/L (ref 3.5–5.1)
Potassium: 4 mmol/L (ref 3.5–5.1)
SODIUM: 141 mmol/L (ref 135–145)
Sodium: 138 mmol/L (ref 135–145)

## 2016-05-13 LAB — CBC
HCT: 24.4 % — ABNORMAL LOW (ref 39.0–52.0)
HEMATOCRIT: 22.1 % — AB (ref 39.0–52.0)
HEMOGLOBIN: 7.6 g/dL — AB (ref 13.0–17.0)
Hemoglobin: 8.4 g/dL — ABNORMAL LOW (ref 13.0–17.0)
MCH: 30.1 pg (ref 26.0–34.0)
MCH: 29.6 pg (ref 26.0–34.0)
MCHC: 34.4 g/dL (ref 30.0–36.0)
MCHC: 34.4 g/dL (ref 30.0–36.0)
MCV: 87.5 fL (ref 78.0–100.0)
MCV: 86 fL (ref 78.0–100.0)
PLATELETS: 69 10*3/uL — AB (ref 150–400)
Platelets: 76 10*3/uL — ABNORMAL LOW (ref 150–400)
RBC: 2.79 MIL/uL — ABNORMAL LOW (ref 4.22–5.81)
RBC: 2.57 MIL/uL — ABNORMAL LOW (ref 4.22–5.81)
RDW: 17.7 % — AB (ref 11.5–15.5)
RDW: 17.6 % — AB (ref 11.5–15.5)
WBC: 49.6 10*3/uL — AB (ref 4.0–10.5)
WBC: 39.4 10*3/uL — AB (ref 4.0–10.5)

## 2016-05-13 LAB — BLOOD GAS, ARTERIAL
ACID-BASE DEFICIT: 7.1 mmol/L — AB (ref 0.0–2.0)
BICARBONATE: 17.2 mmol/L — AB (ref 20.0–28.0)
DRAWN BY: 252031
FIO2: 21
O2 Saturation: 95 %
PCO2 ART: 30.5 mmHg — AB (ref 32.0–48.0)
PH ART: 7.369 (ref 7.350–7.450)
Patient temperature: 98.6
pO2, Arterial: 77 mmHg — ABNORMAL LOW (ref 83.0–108.0)

## 2016-05-13 LAB — MAGNESIUM: MAGNESIUM: 2.3 mg/dL (ref 1.7–2.4)

## 2016-05-13 LAB — URINALYSIS, ROUTINE W REFLEX MICROSCOPIC
BILIRUBIN URINE: NEGATIVE
Glucose, UA: NEGATIVE mg/dL
HGB URINE DIPSTICK: NEGATIVE
Ketones, ur: NEGATIVE mg/dL
Leukocytes, UA: NEGATIVE
Nitrite: NEGATIVE
PH: 5.5 (ref 5.0–8.0)
Protein, ur: NEGATIVE mg/dL
SPECIFIC GRAVITY, URINE: 1.038 — AB (ref 1.005–1.030)

## 2016-05-13 LAB — LACTIC ACID, PLASMA
Lactic Acid, Venous: 3 mmol/L (ref 0.5–1.9)
Lactic Acid, Venous: 2.5 mmol/L (ref 0.5–1.9)

## 2016-05-13 LAB — PROCALCITONIN: Procalcitonin: 4.55 ng/mL

## 2016-05-13 LAB — PREPARE RBC (CROSSMATCH)

## 2016-05-13 MED ORDER — PEG-KCL-NACL-NASULF-NA ASC-C 100 G PO SOLR
0.5000 | Freq: Once | ORAL | Status: AC
  Administered 2016-05-14: 100 g via ORAL
  Filled 2016-05-13: qty 1

## 2016-05-13 MED ORDER — PEG-KCL-NACL-NASULF-NA ASC-C 100 G PO SOLR: 1.0000 | Freq: Once | ORAL | Status: DC

## 2016-05-13 MED ORDER — SODIUM CHLORIDE 0.9 % IV BOLUS (SEPSIS)
500.0000 mL | Freq: Once | INTRAVENOUS | Status: AC
  Administered 2016-05-13: 500 mL via INTRAVENOUS

## 2016-05-13 MED ORDER — PEG-KCL-NACL-NASULF-NA ASC-C 100 G PO SOLR
0.5000 | Freq: Once | ORAL | Status: AC
  Administered 2016-05-13: 100 g via ORAL
  Filled 2016-05-13: qty 1

## 2016-05-13 MED ORDER — METOCLOPRAMIDE HCL 5 MG/ML IJ SOLN
10.0000 mg | Freq: Once | INTRAMUSCULAR | Status: AC
  Administered 2016-05-13: 10 mg via INTRAVENOUS
  Filled 2016-05-13: qty 2

## 2016-05-13 MED ORDER — DEXTROSE-NACL 5-0.45 % IV SOLN
INTRAVENOUS | Status: DC
  Administered 2016-05-13 – 2016-05-17 (×4): via INTRAVENOUS

## 2016-05-13 MED ORDER — METOCLOPRAMIDE HCL 5 MG/ML IJ SOLN
10.0000 mg | Freq: Once | INTRAMUSCULAR | Status: AC
  Administered 2016-05-14: 10 mg via INTRAVENOUS
  Filled 2016-05-13 (×2): qty 2

## 2016-05-13 MED ORDER — SODIUM CHLORIDE 0.9 % IV SOLN: Freq: Once | INTRAVENOUS | Status: DC

## 2016-05-13 MED ORDER — PIPERACILLIN-TAZOBACTAM 3.375 G IVPB
3.3750 g | Freq: Three times a day (TID) | INTRAVENOUS | Status: DC
  Administered 2016-05-13 – 2016-05-16 (×10): 3.375 g via INTRAVENOUS
  Filled 2016-05-13 (×13): qty 50

## 2016-05-13 MED ORDER — VANCOMYCIN HCL IN DEXTROSE 750-5 MG/150ML-% IV SOLN
750.0000 mg | INTRAVENOUS | Status: DC
  Administered 2016-05-13 – 2016-05-15 (×2): 750 mg via INTRAVENOUS
  Filled 2016-05-13 (×2): qty 150

## 2016-05-13 MED ORDER — BISACODYL 5 MG PO TBEC
10.0000 mg | DELAYED_RELEASE_TABLET | Freq: Four times a day (QID) | ORAL | Status: AC
  Administered 2016-05-13: 10 mg via ORAL
  Filled 2016-05-13: qty 2

## 2016-05-13 MED ORDER — ALUM & MAG HYDROXIDE-SIMETH 200-200-20 MG/5ML PO SUSP
15.0000 mL | ORAL | Status: DC | PRN
  Administered 2016-05-13: 15 mL via ORAL
  Filled 2016-05-13 (×3): qty 30

## 2016-05-13 NOTE — Progress Notes (Signed)
Daily Rounding Note  05/13/2016, 11:53 AM  LOS: 2 days   SUBJECTIVE:   Chief complaint:  Abdominal pain superior to the umbilicus into the epigastrium. Also feels bloated. Bloating is something of a chronic problem with the more acute pain is new. Denies nausea vomiting, tolerating clears. No BM yesterday or today.     OBJECTIVE:         Vital signs in last 24 hours:    Temp:  [97.3 F (36.3 C)-98.8 F (37.1 C)] 98.1 F (36.7 C) (11/01 1141) Pulse Rate:  [93-126] 107 (11/01 1000) Resp:  [16-25] 24 (11/01 1000) BP: (107-152)/(49-84) 142/71 (11/01 1000) SpO2:  [86 %-100 %] 86 % (11/01 1000) Weight:  [54.9 kg (121 lb 0.5 oz)] 54.9 kg (121 lb 0.5 oz) (11/01 0416) Last BM Date: 05/12/16 Filed Weights   05/11/16 2245 05/12/16 0302 05/13/16 0416  Weight: 55.6 kg (122 lb 9.2 oz) 53.3 kg (117 lb 8.1 oz) 54.9 kg (121 lb 0.5 oz)   General: Aged, somewhat frail elderly WF in. Actually looks pretty good considering what he's been through and his advanced age.   Heart: RRR. No MRG. Chest: No respiratory difficulty. Lungs clear bilaterally. Abdomen: Mild to moderately distended. Soft. Tender in the region of epigastrium down to just north of the umbilicus. No masses. No hernias. Bowel sounds extremely scant but no tympanitic or tinkling bowel sounds.  Extremities: No CCE. Neuro/Psych:  HOH. Alert. Oriented 3. Appropriate. Follows all commands. No gross deficits, moves all limbs.  Intake/Output from previous day: 10/31 0701 - 11/01 0700 In: 1586.3 [I.V.:1456.3; IV Piggyback:50] Out: 400 [Urine:400]  Intake/Output this shift: Total I/O In: 410 [P.O.:350; I.V.:50; Other:10] Out: 300 [Urine:300]  Lab Results:  Recent Labs  05/12/16 1607 05/12/16 2238 05/13/16 0222  WBC 23.4* 48.5* 49.6*  HGB 9.6* 8.7* 8.4*  HCT 27.5* 25.3* 24.4*  PLT 68* 80* 69*   BMET  Recent Labs  05/11/16 1741 05/12/16 0355 05/13/16 0222  NA  138 143 141  K 3.5 4.8 3.9  CL 111 119* 117*  CO2 23 20* 15*  GLUCOSE 217* 141* 137*  BUN 26* 22* 38*  CREATININE 1.09 1.21 1.95*  CALCIUM 7.9* 7.4* 7.9*   LFT  Recent Labs  05/11/16 1741  PROT 5.1*  ALBUMIN 3.2*  AST 22  ALT 9*  ALKPHOS 48  BILITOT 0.5   PT/INR  Recent Labs  05/11/16 1741  LABPROT 15.8*  INR 1.25   Hepatitis Panel No results for input(s): HEPBSAG, HCVAB, HEPAIGM, HEPBIGM in the last 72 hours.  Studies/Results: Ir Angiogram Visceral Selective Ir Angiogram Selective Each Additional Vessel  Result Date: 05/12/2016 INDICATION: 82 year old male with life-threatening lower GI hemorrhage. CT angiogram demonstrates hemorrhage within the distribution the right colic artery/middle colic artery near the hepatic flexure. EXAM: SELECTIVE VISCERAL ARTERIOGRAPHY; IR EMBO ART VEN HEMORR LYMPH EXTRAV INC GUIDE ROADMAPPING; IR ULTRASOUND GUIDANCE VASC ACCESS RIGHT; ADDITIONAL ARTERIOGRAPHY; ARTERIOGRAPHY MEDICATIONS: 100 mcg nitro, 4 mg IV Zofran. The antibiotic was administered within 1 hour of the procedure ANESTHESIA/SEDATION: Moderate (conscious) sedation was employed during this procedure. A total of Versed 2.0 mg and Fentanyl 100 mcg was administered intravenously. Moderate Sedation Time: 90 minutes. The patient's level of consciousness and vital signs were monitored continuously by radiology nursing throughout the procedure under my direct supervision. CONTRAST:  160 cc Isovue FLUOROSCOPY TIME:  Fluoroscopy Time: 16 minutes 0 seconds (31.3 mGy). COMPLICATIONS: None PROCEDURE: Informed consent was obtained from the patient  following explanation of the procedure, risks, benefits and alternatives. The patient understands, agrees and consents for the procedure. All questions were addressed. A time out was performed prior to the initiation of the procedure. Maximal barrier sterile technique utilized including caps, mask, sterile gowns, sterile gloves, large sterile drape, hand  hygiene, and Betadine prep. Ultrasound survey of the right inguinal region was performed with images stored and sent to PACs. A micropuncture needle was used access the right common femoral artery under ultrasound. With excellent arterial blood flow returned, and an .018 micro wire was passed through the needle, observed enter the abdominal aorta under fluoroscopy. The needle was removed, and a micropuncture sheath was placed over the wire. The inner dilator and wire were removed, and an 035 Bentson wire was advanced under fluoroscopy into the abdominal aorta. The sheath was removed and a standard 5 [place] vascular sheath was placed. The dilator was removed and the sheath was flushed. Standard 5 French C2 Cobra catheter was advanced over the [place] wire. Cobra catheter was used to select the superior mesenteric artery. Angiogram was performed. C2 Cobra was exchanged over the [place] wire for [place] catheter. Micro catheter system was then used through the [place] catheter with 0.016 micro wire and 135cm [place] micro catheter. Right/middle hepatic artery was then selected as the vasculature perfusing the area of interest. Sub selective angiogram performed of branches of the right/ middle artery. Given the significant tortuosity of the vasculature and small caliber, micro catheter was not able to advance to a distal position. Angiogram demonstrated no evidence of active extravasation. 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 Single Gel-Foam pledget infused into target artery of interest, and then withdrawal of the catheter in repeat angiogram demonstrated small additional dysplastic branches at the hepatic flexure. A course Gel-Foam slurry was then infused with no significant filling at the completion. Micro catheter was flushed, withdrawn into the superior mesenteric artery, and then angiogram of the collateral vasculature to the GDA was performed. No perfusion of the areae of interest observed.  Limited angiogram of the right common femoral artery performed. Exoseal was deployed for closure. Patient tolerated the procedure well and remained hemodynamically stable throughout. No complications were encountered and no significant blood loss encountered. FINDINGS: Ultrasound survey demonstrates mild atherosclerotic changes of the right common femoral artery which is widely patent. Superior mesenteric artery angiogram demonstrates patent superior mesenteric artery, patent ileal colic artery, patent arcades to the small bowel vasculature. 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 Splenic artery patent, left gastric artery patent, common hepatic artery patent, left hepatic artery and right hepatic artery patent. Large collateral vasculature contributed to the origin of gastroepiploic artery. Perfusion of the hepatic flexure was predominantly via right colic/middle colic branch. Sub selective angiogram of the colic branches demonstrated no active extravasation or pseudoaneurysm. The knee shunting observed with angio dysplasia. Given the tortuosity and the inability to place the tip of the micro catheter within distal branches, no proximal coil was deployed. Instead, course Gel-Foam embolization of the territory was elected as treatment to decrease the pressure to the territory. Puncture of the right common femoral artery. 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 Tortuosity and small vasculature precluded a distal catheter position into the territory for deposition of metallic coils.  Deployment of Exoseal for hemostasis. Signed, Dulcy Fanny. Earleen Newport, DO [facility] Radiology Electronically Signed   By: Corrie Mckusick D.O.   On: 05/12/2016 08:22   Ir Angiogram Follow Up Study  Result Date: 05/12/2016 INDICATION: 82 year old male with life-threatening lower GI hemorrhage. CT angiogram demonstrates hemorrhage within the distribution the right colic artery/middle colic artery near the hepatic flexure. EXAM: SELECTIVE VISCERAL ARTERIOGRAPHY; IR EMBO ART VEN HEMORR LYMPH EXTRAV INC GUIDE ROADMAPPING; IR ULTRASOUND GUIDANCE VASC ACCESS RIGHT; ADDITIONAL ARTERIOGRAPHY; ARTERIOGRAPHY MEDICATIONS: 100 mcg nitro, 4 mg IV Zofran. The antibiotic was administered within 1 hour of the procedure ANESTHESIA/SEDATION: Moderate (conscious) sedation was employed during this procedure. A total of Versed 2.0 mg and Fentanyl 100 mcg was administered intravenously. Moderate Sedation Time: 90 minutes. The patient's level of consciousness and vital signs were monitored continuously by radiology nursing throughout the procedure under my direct supervision. CONTRAST:  160 cc Isovue FLUOROSCOPY TIME:  Fluoroscopy Time: 16 minutes 0 seconds (31.3 mGy). COMPLICATIONS: None PROCEDURE: Informed consent was obtained from the patient following explanation of the procedure, risks, benefits and alternatives. The patient understands, agrees and consents for the procedure. All questions were addressed. A time out was performed prior to the initiation of the procedure. Maximal barrier sterile technique utilized including caps, mask, sterile gowns, sterile gloves, large sterile drape, hand hygiene, and Betadine prep. Ultrasound survey of the right inguinal region was performed with images stored and sent to PACs. A micropuncture needle was used access the right common femoral artery under ultrasound. With excellent arterial blood flow returned, and an .018 micro wire was passed through the needle, observed enter the abdominal aorta under fluoroscopy. The needle was removed, and a  micropuncture sheath was placed over the wire. The inner dilator and wire were removed, and an 035 Bentson wire was advanced under fluoroscopy into the abdominal aorta. The sheath was removed and a standard 5 [place] vascular sheath was placed. The dilator was removed and the sheath was flushed. Standard 5 French C2 Cobra catheter was advanced over the [place] wire. Cobra catheter was used to select the superior mesenteric artery. Angiogram was performed. C2 Cobra was exchanged over the Bents[place] wire for [place] catheter. Micro catheter system was then used through the [place] catheter with 0.016 micro wire and 135cm [place] micro catheter. Right/middle hepatic artery was then selected as the vasculature perfusing the area of interest. Sub selective angiogram performed of branches of the right/ middle artery. Given the significant tortuosity of the vasculature and small caliber, micro catheter was not able to advance to a distal position. Angiogram demonstrated no evidence of active extravasation. 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 Single Gel-Foam pledget infused into target artery of interest, and then withdrawal of the catheter in repeat angiogram demonstrated small additional dysplastic branches at the hepatic flexure. A course Gel-Foam slurry was then infused with no significant filling at the completion. Micro catheter was flushed, withdrawn into the superior mesenteric artery, and then angiogram of the collateral vasculature to the GDA was performed. No perfusion of the areae of interest observed. Limited angiogram of the right common femoral artery performed. Exoseal was deployed for closure. Patient tolerated the procedure well and remained hemodynamically stable throughout. No complications were encountered and no significant blood loss encountered. FINDINGS: Ultrasound survey demonstrates mild atherosclerotic changes of the right common femoral artery which is widely patent. Superior  mesenteric artery angiogram demonstrates patent superior mesenteric artery, patent ileal colic artery, patent arcades to the  small bowel vasculature. 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 Splenic artery patent, left gastric artery patent, common hepatic artery patent, left hepatic artery and right hepatic artery patent. Large collateral vasculature contributed to the origin of gastroepiploic artery. Perfusion of the hepatic flexure was predominantly via right colic/middle colic branch. Sub selective angiogram of the colic branches demonstrated no active extravasation or pseudoaneurysm. The knee shunting observed with angio dysplasia. Given the tortuosity and the inability to place the tip of the micro catheter within distal branches, no proximal coil was deployed. Instead, course Gel-Foam embolization of the territory was elected as treatment to decrease the pressure to the territory. Puncture of the right common femoral artery. 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 Tortuosity and small vasculature precluded a distal catheter position into the territory for deposition of metallic coils. Deployment of Exoseal for hemostasis. Signed, Dulcy Fanny. Earleen Newport, DO [facility] Radiology Electronically Signed   By: Corrie Mckusick D.O.   On: 05/12/2016 08:22   Ir US Guide Vasc Access Right  Dg Chest Port 1 View  Result Date: 05/13/2016 CLINICAL DATA:  Dyspnea. EXAM: PORTABLE CHEST 1 VIEW COMPARISON:  Radiographs of Nov 26, 2008. FINDINGS: Stable cardiomediastinal silhouette. Atherosclerosis of thoracic aorta is noted. No pneumothorax is noted. Right  lung is clear. Mild left basilar atelectasis or infiltrate is noted with minimal associated pleural effusion. Bony thorax is unremarkable. IMPRESSION: Aortic atherosclerosis. Mild left basilar atelectasis or infiltrate is noted with minimal associated pleural effusion. Electronically Signed   By: Marijo Conception, M.D.   On: 05/13/2016 07:30   Dg Knee Left Port  Result Date: 05/12/2016 CLINICAL DATA:  Recent fall.  Pain . EXAM: PORTABLE LEFT KNEE - 1-2 VIEW COMPARISON:  No recent prior. FINDINGS: Mild tricompartment degenerative change. No acute bony or joint abnormality identified. No evidence of fracture or dislocation. IMPRESSION: Mild tricompartment degenerative change.  No acute abnormality Electronically Signed   By: Marcello Moores  Register   On: 05/12/2016 07:32   Ct Angio Abd/pel W And/or Wo Contrast  Result Date: 05/11/2016 CLINICAL DATA:  Acute onset of syncope and bright red rectal bleeding. Lightheadedness and generalized weakness. Initial encounter. EXAM: CTA ABDOMEN AND PELVIS wITHOUT AND WITH CONTRAST TECHNIQUE: Multidetector CT imaging of the abdomen and pelvis was performed using the standard protocol during bolus administration of intravenous contrast. Multiplanar reconstructed images and MIPs were obtained and reviewed to evaluate the vascular anatomy. CONTRAST:  75 mL of Isovue 370 IV contrast COMPARISON:  None. FINDINGS: VASCULAR Aorta: Scattered calcification is seen along the abdominal aorta, without significant luminal narrowing. 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 SMA: The superior mesenteric artery appears patent, without evidence of luminal narrowing. Renals: Mild calcification is noted at the proximal renal arteries bilaterally, without significant luminal narrowing. IMA: The inferior mesenteric artery remains patent. Inflow: Scattered calcification is seen along the common and internal iliac arteries. The  external iliac arteries and common femoral arteries remain fully patent bilaterally, with mild calcification at the right common femoral artery. Proximal Outflow: Minimal mural thrombus is suggested at the proximal left superficial femoral artery. Minimal mural thrombus is suggested at the proximal right profunda femoris artery. Veins: Visualized venous structures are  grossly unremarkable. The inferior vena cava is partially decompressed and unremarkable in appearance. Review of the MIP images confirms the above findings. NON-VASCULAR Lower chest: Mild bibasilar atelectasis or scarring is noted. Diffuse coronary artery calcifications are seen. The visualized portions of the mediastinum are otherwise unremarkable. Hepatobiliary: The liver is unremarkable in appearance. The gallbladder is unremarkable in appearance. The common bile duct remains normal in caliber. Pancreas: The pancreas is within normal limits. Spleen: The spleen is unremarkable in appearance. Adrenals/Urinary Tract: The adrenal glands are unremarkable in appearance. A large 7.5 cm cyst is noted at the upper pole of the left kidney. Mild nonspecific perinephric stranding is noted bilaterally. Additional scattered small bilateral renal cysts are seen. Mild left-sided renal pelvicaliectasis remains within normal limits, without evidence of significant hydronephrosis. No renal or ureteral stones are identified. Stomach/Bowel: The stomach is unremarkable in appearance. The small bowel is within normal limits. The appendix is not visualized; there is no evidence for appendicitis. Scattered diverticulosis is noted along the proximal sigmoid colon, without evidence of diverticulitis. There is a large amount of acute extravasation of contrast at the ascending colon, just proximal to the hepatic flexure of the colon, compatible with active intraluminal hemorrhage. This explains the patient's bright red blood per rectum. Lymphatic: Retroperitoneal nodes are  grossly unremarkable in appearance. No pelvic sidewall lymphadenopathy is appreciated. Reproductive: The bladder is mildly distended and grossly unremarkable. The prostate remains normal in size. Other: No additional soft tissue abnormalities are seen. Musculoskeletal: No acute osseous abnormalities are identified. Vacuum phenomenon is noted at L5-S1. Underlying facet disease is noted. The visualized musculature is unremarkable in appearance. IMPRESSION: VASCULAR 1. Large amount of acute extravasation of contrast at the ascending colon, just proximal to the hepatic flexure of the colon, compatible with active intraluminal hemorrhage. This explains the patient's bright red blood per rectum. 2. Scattered aortic atherosclerosis noted. 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 3. Diffuse coronary artery calcifications seen. NON-VASCULAR 1. Mild bibasilar atelectasis or scarring noted. 2. Scattered bilateral renal cysts, measuring up to 7.5 cm on the left. 3. Scattered diverticulosis along the proximal sigmoid colon, without evidence of diverticulitis. These results were called by telephone at the time of interpretation on 05/11/2016 at 9:58 pm to Dr. Carrie Mew, who verbally acknowledged these results. Electronically Signed   By: Garald Balding M.D.   On: 05/11/2016 22:04    ASSESMENT:   *  Lower GI bleed.  S/p 10/31 gelfoam embloization distal branch of middle colic artery.   Lacitc acid level improved.   *  Blood loss anemia.  S/p multiple PRBCs.   *  Thrombocytopenia.  Suspect due to consumption.   *  AKI.     PLAN   *  Colonoscopy 11 AM.  Patient is agreeable to proceed. Orders entered for split dose bowel prep.  *  Neck CBC will be collected at midnight. If the hemoglobin drops much further, would strongly recommend transfusing with PRBC. In order to facilitate transfusions in a timely manner on going to change the CBC to  earlier this evening.   [name]  05/13/2016, 11:53 AM Pager: [phone]     Attending physician's note   I have taken an interval history, reviewed the chart and examined the patient. I agree with the Advanced Practitioner's note, impression and recommendations. He complains of abd pain and bloating. No GI bleeding. Abd is more distended today and mildly tender. Abd films now and if they  do not show an acute process will proceed with bowel prep and colonoscopy tomorrow as planned.   Lucio Edward, MD [facility] [phone] Mon-Fri 8a-5p [phone] after 5p, weekends, holidays

## 2016-05-13 NOTE — Progress Notes (Signed)
Referring Physician(s): Dr. Governor Rooks  Supervising Physician: Corrie Mckusick  Patient Status:  [facility] - In-pt  Chief Complaint:  Lower GI bleed S/p embolization  Subjective:  Procedure 10/31 Mesenteric angiogram, SMA, GDA, right colic/middle colic.  Gelfoam embolization of distal branch of middle colic artery at the hepatic flexure  Findings:  No extravasation identified.  No pseudoaneurysm.  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   Tortuosity of the middle colic artery precluded distal placement of the catheter tip, thus a course gel-foam embolization was performed.  No further direct filling of the target vessels at conclusion.   Closure of the R CFA puncture with Exoseal.   Feeling ok Abdominal pain, denies other complaints, denies any continued bleeding  Allergies: Review of patient's allergies indicates no known allergies.  Medications: Prior to Admission medications   Medication Sig Start Date End Date Taking? Authorizing Provider  aspirin EC 81 MG tablet Take 81 mg by mouth daily.    Yes Historical Provider, MD     Vital Signs: BP (!) 142/71   Pulse (!) 107   Temp 98.1 F (36.7 C) (Oral)   Resp (!) 24   Ht 5\' 7"  (1.702 m)   Wt 121 lb 0.5 oz (54.9 kg)   SpO2 (!) 86%   BMI 18.96 kg/m   Physical Exam  Skin:  Puncture Site c/d/i    Imaging: Ir Angiogram Visceral Selective  Result Date: 05/12/2016 INDICATION: 82 year old male with life-threatening lower GI hemorrhage. CT angiogram demonstrates hemorrhage within the distribution the right colic artery/middle colic artery near the hepatic flexure. EXAM: SELECTIVE VISCERAL ARTERIOGRAPHY; IR EMBO ART VEN HEMORR LYMPH EXTRAV INC GUIDE ROADMAPPING; IR ULTRASOUND GUIDANCE VASC ACCESS RIGHT; ADDITIONAL ARTERIOGRAPHY; ARTERIOGRAPHY MEDICATIONS: 100 mcg nitro, 4 mg IV Zofran. The antibiotic was administered within 1 hour of the procedure ANESTHESIA/SEDATION: Moderate (conscious)  sedation was employed during this procedure. A total of Versed 2.0 mg and Fentanyl 100 mcg was administered intravenously. Moderate Sedation Time: 90 minutes. The patient's level of consciousness and vital signs were monitored continuously by radiology nursing throughout the procedure under my direct supervision. CONTRAST:  160 cc Isovue FLUOROSCOPY TIME:  Fluoroscopy Time: 16 minutes 0 seconds (31.3 mGy). COMPLICATIONS: None PROCEDURE: Informed consent was obtained from the patient following explanation of the procedure, risks, benefits and alternatives. The patient understands, agrees and consents for the procedure. All questions were addressed. A time out was performed prior to the initiation of the procedure. Maximal barrier sterile technique utilized including caps, mask, sterile gowns, sterile gloves, large sterile drape, hand hygiene, and Betadine prep. Ultrasound survey of the right inguinal region was performed with images stored and sent to PACs. A micropuncture needle was used access the right common femoral artery under ultrasound. With excellent arterial blood flow returned, and an .018 micro wire was passed through the needle, observed enter the abdominal aorta under fluoroscopy. The needle was removed, and a micropuncture sheath was placed over the wire. The inner dilator and wire were removed, and an 035 Bentson wire was advanced under fluoroscopy into the abdominal aorta. The sheath was removed and a standard 5 [place] vascular sheath was placed. The dilator was removed and the sheath was flushed. Standard 5 French C2 Cobra catheter was advanced over the [place] wire. Cobra catheter was used to select the superior mesenteric artery. Angiogram was performed. C2 Cobra was exchanged over the [place] wire for [place] catheter. Micro catheter system was then used through the [place] catheter with 0.016 micro  wire and 135cm [place] micro catheter. Right/middle hepatic artery was then selected as the  vasculature perfusing the area of interest. Sub selective angiogram performed of branches of the right/ middle artery. Given the significant tortuosity of the vasculature and small caliber, micro catheter was not able to advance to a distal position. Angiogram demonstrated no evidence of active extravasation. 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 Single Gel-Foam pledget infused into target artery of interest, and then withdrawal of the catheter in repeat angiogram demonstrated small additional dysplastic branches at the hepatic flexure. A course Gel-Foam slurry was then infused with no significant filling at the completion. Micro catheter was flushed, withdrawn into the superior mesenteric artery, and then angiogram of the collateral vasculature to the GDA was performed. No perfusion of the areae of interest observed. Limited angiogram of the right common femoral artery performed. Exoseal was deployed for closure. Patient tolerated the procedure well and remained hemodynamically stable throughout. No complications were encountered and no significant blood loss encountered. FINDINGS: Ultrasound survey demonstrates mild atherosclerotic changes of the right common femoral artery which is widely patent. Superior mesenteric artery angiogram demonstrates patent superior mesenteric artery, patent ileal colic artery, patent arcades to the small bowel vasculature. 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 Splenic artery patent, left gastric artery patent, common hepatic artery patent, left hepatic artery and right hepatic artery patent. Large collateral vasculature contributed to the origin of gastroepiploic artery. Perfusion of the hepatic flexure was predominantly via right colic/middle colic branch. Sub selective angiogram of  the colic branches demonstrated no active extravasation or pseudoaneurysm. The knee shunting observed with angio dysplasia. Given the tortuosity and the inability to place the tip of the micro catheter within distal branches, no proximal coil was deployed. Instead, course Gel-Foam embolization of the territory was elected as treatment to decrease the pressure to the territory. Puncture of the right common femoral artery. 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 Tortuosity and small vasculature precluded a distal catheter position into the territory for deposition of metallic coils. Deployment of Exoseal for hemostasis. Signed, Dulcy Fanny. Earleen Newport, DO [facility] Radiology Electronically Signed   By: Corrie Mckusick D.O.   On: 05/12/2016 08:22   Ir Angiogram Selective Each Additional Vessel  Result Date: 05/12/2016 INDICATION: 82 year old male with life-threatening lower GI hemorrhage. CT angiogram demonstrates hemorrhage within the distribution the right colic artery/middle colic artery near the hepatic flexure. EXAM: SELECTIVE VISCERAL ARTERIOGRAPHY; IR EMBO ART VEN HEMORR LYMPH EXTRAV INC GUIDE ROADMAPPING; IR ULTRASOUND GUIDANCE VASC ACCESS RIGHT; ADDITIONAL ARTERIOGRAPHY; ARTERIOGRAPHY MEDICATIONS: 100 mcg nitro, 4 mg IV Zofran. The antibiotic was administered within 1 hour of the procedure ANESTHESIA/SEDATION: Moderate (conscious) sedation was employed during this procedure. A total of Versed 2.0 mg and Fentanyl 100 mcg was administered intravenously. Moderate Sedation Time: 90 minutes. The patient's level of consciousness and vital signs were monitored continuously by radiology nursing throughout the procedure under my direct supervision. CONTRAST:  160 cc Isovue FLUOROSCOPY TIME:  Fluoroscopy Time: 16 minutes 0 seconds  (31.3 mGy). COMPLICATIONS: None PROCEDURE: Informed consent was obtained from the patient following explanation of the procedure, risks, benefits and alternatives. The patient understands, agrees and consents for the procedure. All questions were addressed. A time out was  performed prior to the initiation of the procedure. Maximal barrier sterile technique utilized including caps, mask, sterile gowns, sterile gloves, large sterile drape, hand hygiene, and Betadine prep. Ultrasound survey of the right inguinal region was performed with images stored and sent to PACs. A micropuncture needle was used access the right common femoral artery under ultrasound. With excellent arterial blood flow returned, and an .018 micro wire was passed through the needle, observed enter the abdominal aorta under fluoroscopy. The needle was removed, and a micropuncture sheath was placed over the wire. The inner dilator and wire were removed, and an 035 Bentson wire was advanced under fluoroscopy into the abdominal aorta. The sheath was removed and a standard 5 [place] vascular sheath was placed. The dilator was removed and the sheath was flushed. Standard 5 French C2 Cobra catheter was advanced over the [place] wire. Cobra catheter was used to select the superior mesenteric artery. Angiogram was performed. C2 Cobra was exchanged over the [place] wire for [place] catheter. Micro catheter system was then used through the [place] catheter with 0.016 micro wire and 135cm [place] micro catheter. Right/middle hepatic artery was then selected as the vasculature perfusing the area of interest. Sub selective angiogram performed of branches of the right/ middle artery. Given the significant tortuosity of the vasculature and small caliber, micro catheter was not able to advance to a distal position. Angiogram demonstrated no evidence of active extravasation. 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 Single Gel-Foam pledget infused into  target artery of interest, and then withdrawal of the catheter in repeat angiogram demonstrated small additional dysplastic branches at the hepatic flexure. A course Gel-Foam slurry was then infused with no significant filling at the completion. Micro catheter was flushed, withdrawn into the superior mesenteric artery, and then angiogram of the collateral vasculature to the GDA was performed. No perfusion of the areae of interest observed. Limited angiogram of the right common femoral artery performed. Exoseal was deployed for closure. Patient tolerated the procedure well and remained hemodynamically stable throughout. No complications were encountered and no significant blood loss encountered. FINDINGS: Ultrasound survey demonstrates mild atherosclerotic changes of the right common femoral artery which is widely patent. Superior mesenteric artery angiogram demonstrates patent superior mesenteric artery, patent ileal colic artery, patent arcades to the small bowel vasculature. 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 Splenic artery patent, left gastric artery patent, common hepatic artery patent, left hepatic artery and right hepatic artery patent. Large collateral vasculature contributed to the origin of gastroepiploic artery. Perfusion of the hepatic flexure was predominantly via right colic/middle colic branch. Sub selective angiogram of the colic branches demonstrated no active extravasation or pseudoaneurysm. The knee shunting observed with angio dysplasia. Given the tortuosity and the inability to place the tip of the micro catheter within distal branches, no proximal coil was deployed. Instead, course Gel-Foam embolization of the territory was elected as treatment to decrease the pressure to the territory. Puncture of the right  common femoral artery. 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 Tortuosity and small vasculature precluded a distal catheter position into the territory for deposition of metallic coils. Deployment of Exoseal for hemostasis. Signed, Dulcy Fanny. Earleen Newport, DO [facility] Radiology Electronically Signed   By: Corrie Mckusick D.O.  On: 05/12/2016 08:22   Ir Angiogram Selective Each Additional Vessel  Result Date: 05/12/2016 INDICATION: 82 year old male with life-threatening lower GI hemorrhage. CT angiogram demonstrates hemorrhage within the distribution the right colic artery/middle colic artery near the hepatic flexure. EXAM: SELECTIVE VISCERAL ARTERIOGRAPHY; IR EMBO ART VEN HEMORR LYMPH EXTRAV INC GUIDE ROADMAPPING; IR ULTRASOUND GUIDANCE VASC ACCESS RIGHT; ADDITIONAL ARTERIOGRAPHY; ARTERIOGRAPHY MEDICATIONS: 100 mcg nitro, 4 mg IV Zofran. The antibiotic was administered within 1 hour of the procedure ANESTHESIA/SEDATION: Moderate (conscious) sedation was employed during this procedure. A total of Versed 2.0 mg and Fentanyl 100 mcg was administered intravenously. Moderate Sedation Time: 90 minutes. The patient's level of consciousness and vital signs were monitored continuously by radiology nursing throughout the procedure under my direct supervision. CONTRAST:  160 cc Isovue FLUOROSCOPY TIME:  Fluoroscopy Time: 16 minutes 0 seconds (31.3 mGy). COMPLICATIONS: None PROCEDURE: Informed consent was obtained from the patient following explanation of the procedure, risks, benefits and alternatives. The patient understands, agrees and consents for the procedure. All questions were addressed. A time out was performed prior to the initiation of the procedure. Maximal barrier sterile technique utilized including caps, mask, sterile gowns,  sterile gloves, large sterile drape, hand hygiene, and Betadine prep. Ultrasound survey of the right inguinal region was performed with images stored and sent to PACs. A micropuncture needle was used access the right common femoral artery under ultrasound. With excellent arterial blood flow returned, and an .018 micro wire was passed through the needle, observed enter the abdominal aorta under fluoroscopy. The needle was removed, and a micropuncture sheath was placed over the wire. The inner dilator and wire were removed, and an 035 Bentson wire was advanced under fluoroscopy into the abdominal aorta. The sheath was removed and a standard 5 [place] vascular sheath was placed. The dilator was removed and the sheath was flushed. Standard 5 French C2 Cobra catheter was advanced over the [place] wire. Cobra catheter was used to select the superior mesenteric artery. Angiogram was performed. C2 Cobra was exchanged over the [place] wire for [place] catheter. Micro catheter system was then used through the [place] catheter with 0.016 micro wire and 135cm Lan[place] micro catheter. Right/middle hepatic artery was then selected as the vasculature perfusing the area of interest. Sub selective angiogram performed of branches of the right/ middle artery. Given the significant tortuosity of the vasculature and small caliber, micro catheter was not able to advance to a distal position. Angiogram demonstrated no evidence of active extravasation. 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 Single Gel-Foam pledget infused into target artery of interest, and then withdrawal of the catheter in repeat angiogram demonstrated small additional dysplastic branches at the hepatic flexure. A course Gel-Foam slurry was then infused with no significant filling at the completion. Micro catheter was flushed, withdrawn into the superior mesenteric artery, and then angiogram of the collateral vasculature to the GDA was performed. No  perfusion of the areae of interest observed. Limited angiogram of the right common femoral artery performed. Exoseal was deployed for closure. Patient tolerated the procedure well and remained hemodynamically stable throughout. No complications were encountered and no significant blood loss encountered. FINDINGS: Ultrasound survey demonstrates mild atherosclerotic changes of the right common femoral artery which is widely patent. Superior mesenteric artery angiogram demonstrates patent superior mesenteric artery, patent ileal colic artery, patent arcades to the small bowel vasculature. 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 Splenic artery patent, left gastric artery patent, common hepatic artery patent, left hepatic artery and right hepatic artery patent. Large collateral vasculature contributed to the origin of gastroepiploic artery. Perfusion of the hepatic flexure was predominantly via right colic/middle colic branch. Sub selective angiogram of the colic branches demonstrated no active extravasation or pseudoaneurysm. The knee shunting observed with angio dysplasia. Given the tortuosity and the inability to place the tip of the micro catheter within distal branches, no proximal coil was deployed. Instead, course Gel-Foam embolization of the territory was elected as treatment to decrease the pressure to the territory. Puncture of the right common femoral artery. 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 Tortuosity and small vasculature precluded a distal catheter position into the territory for deposition of metallic coils. Deployment of Exoseal for hemostasis. Signed, Dulcy Fanny. Earleen Newport, DO  [facility] Radiology Electronically Signed   By: Corrie Mckusick D.O.   On: 05/12/2016 08:22   Ir Angiogram Selective Each Additional Vessel  Result Date: 05/12/2016 INDICATION: 82 year old male with life-threatening lower GI hemorrhage. CT angiogram demonstrates hemorrhage within the distribution the right colic artery/middle colic artery near the hepatic flexure. EXAM: SELECTIVE VISCERAL ARTERIOGRAPHY; IR EMBO ART VEN HEMORR LYMPH EXTRAV INC GUIDE ROADMAPPING; IR ULTRASOUND GUIDANCE VASC ACCESS RIGHT; ADDITIONAL ARTERIOGRAPHY; ARTERIOGRAPHY MEDICATIONS: 100 mcg nitro, 4 mg IV Zofran. The antibiotic was administered within 1 hour of the procedure ANESTHESIA/SEDATION: Moderate (conscious) sedation was employed during this procedure. A total of Versed 2.0 mg and Fentanyl 100 mcg was administered intravenously. Moderate Sedation Time: 90 minutes. The patient's level of consciousness and vital signs were monitored continuously by radiology nursing throughout the procedure under my direct supervision. CONTRAST:  160 cc Isovue FLUOROSCOPY TIME:  Fluoroscopy Time: 16 minutes 0 seconds (31.3 mGy). COMPLICATIONS: None PROCEDURE: Informed consent was obtained from the patient following explanation of the procedure, risks, benefits and alternatives. The patient understands, agrees and consents for the procedure. All questions were addressed. A time out was performed prior to the initiation of the procedure. Maximal barrier sterile technique utilized including caps, mask, sterile gowns, sterile gloves, large sterile drape, hand hygiene, and Betadine prep. Ultrasound survey of the right inguinal region was performed with images stored and sent to PACs. A micropuncture needle was used access the right common femoral artery under ultrasound. With excellent arterial blood flow returned, and an .018 micro wire was passed through the needle, observed enter the abdominal aorta  under fluoroscopy. The needle was removed, and a micropuncture sheath was placed over the wire. The inner dilator and wire were removed, and an 035 Bentson wire was advanced under fluoroscopy into the abdominal aorta. The sheath was removed and a standard 5 [place] vascular sheath was placed. The dilator was removed and the sheath was flushed. Standard 5 French C2 Cobra catheter was advanced over the [place] wire. Cobra catheter was used to select the superior mesenteric artery. Angiogram was performed. C2 Cobra was exchanged over the [place] wire for [place] catheter. Micro catheter system was then used through the [place] catheter with 0.016 micro wire and 135cm [place] micro catheter. Right/middle hepatic artery was then selected as the vasculature perfusing the area of interest. Sub selective angiogram performed of branches of the right/ middle artery. Given the significant tortuosity of the vasculature and small caliber, micro catheter was not able to advance to a  distal position. Angiogram demonstrated no evidence of active extravasation. 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 Single Gel-Foam pledget infused into target artery of interest, and then withdrawal of the catheter in repeat angiogram demonstrated small additional dysplastic branches at the hepatic flexure. A course Gel-Foam slurry was then infused with no significant filling at the completion. Micro catheter was flushed, withdrawn into the superior mesenteric artery, and then angiogram of the collateral vasculature to the GDA was performed. No perfusion of the areae of interest observed. Limited angiogram of the right common femoral artery performed. Exoseal was deployed for closure. Patient tolerated the procedure well and remained hemodynamically stable throughout. No complications were encountered and no significant blood loss encountered. FINDINGS: Ultrasound survey demonstrates mild atherosclerotic changes of the right common  femoral artery which is widely patent. Superior mesenteric artery angiogram demonstrates patent superior mesenteric artery, patent ileal colic artery, patent arcades to the small bowel vasculature. 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 Splenic artery patent, left gastric artery patent, common hepatic artery patent, left hepatic artery and right hepatic artery patent. Large collateral vasculature contributed to the origin of gastroepiploic artery. Perfusion of the hepatic flexure was predominantly via right colic/middle colic branch. Sub selective angiogram of the colic branches demonstrated no active extravasation or pseudoaneurysm. The knee shunting observed with angio dysplasia. Given the tortuosity and the inability to place the tip of the micro catheter within distal branches, no proximal coil was deployed. Instead, course Gel-Foam embolization of the territory was elected as treatment to decrease the pressure to the territory. Puncture of the right common femoral artery. 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 Tortuosity and small vasculature precluded a distal catheter position into the territory for deposition of metallic coils. Deployment of Exoseal for hemostasis. Signed, Dulcy Fanny. Earleen Newport, DO [facility] Radiology Electronically Signed   By: Corrie Mckusick D.O.   On: 05/12/2016 08:22   Ir Angiogram Follow Up Study  Result Date: 05/12/2016 INDICATION: 82 year old male with life-threatening lower GI hemorrhage. CT angiogram demonstrates hemorrhage within the distribution the right colic artery/middle colic artery near the hepatic  flexure. EXAM: SELECTIVE VISCERAL ARTERIOGRAPHY; IR EMBO ART VEN HEMORR LYMPH EXTRAV INC GUIDE ROADMAPPING; IR ULTRASOUND GUIDANCE VASC ACCESS RIGHT; ADDITIONAL ARTERIOGRAPHY; ARTERIOGRAPHY MEDICATIONS: 100 mcg nitro, 4 mg IV Zofran. The antibiotic was administered within 1 hour of the procedure ANESTHESIA/SEDATION: Moderate (conscious) sedation was employed during this procedure. A total of Versed 2.0 mg and Fentanyl 100 mcg was administered intravenously. Moderate Sedation Time: 90 minutes. The patient's level of consciousness and vital signs were monitored continuously by radiology nursing throughout the procedure under my direct supervision. CONTRAST:  160 cc Isovue FLUOROSCOPY TIME:  Fluoroscopy Time: 16 minutes 0 seconds (31.3 mGy). COMPLICATIONS: None PROCEDURE: Informed consent was obtained from the patient following explanation of the procedure, risks, benefits and alternatives. The patient understands, agrees and consents for the procedure. All questions were addressed. A time out was performed prior to the initiation of the procedure. Maximal barrier sterile technique utilized including caps, mask, sterile gowns, sterile gloves, large sterile drape, hand hygiene, and Betadine prep. Ultrasound survey of the right inguinal region was performed with images stored and sent to PACs. A micropuncture needle was used access the right  common femoral artery under ultrasound. With excellent arterial blood flow returned, and an .018 micro wire was passed through the needle, observed enter the abdominal aorta under fluoroscopy. The needle was removed, and a micropuncture sheath was placed over the wire. The inner dilator and wire were removed, and an 035 Bentson wire was advanced under fluoroscopy into the abdominal aorta. The sheath was removed and a standard 5 [place] vascular sheath was placed. The dilator was removed and the sheath was flushed. Standard 5 French C2 Cobra catheter was advanced over the [place] wire.  Cobra catheter was used to select the superior mesenteric artery. Angiogram was performed. C2 Cobra was exchanged over the Bentson wire for [place] catheter. Micro catheter system was then used through the [place] catheter with 0.016 micro wire and 135cm [place] micro catheter. Right/middle hepatic artery was then selected as the vasculature perfusing the area of interest. Sub selective angiogram performed of branches of the right/ middle artery. Given the significant tortuosity of the vasculature and small caliber, micro catheter was not able to advance to a distal position. Angiogram demonstrated no evidence of active extravasation. 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 Single Gel-Foam pledget infused into target artery of interest, and then withdrawal of the catheter in repeat angiogram demonstrated small additional dysplastic branches at the hepatic flexure. A course Gel-Foam slurry was then infused with no significant filling at the completion. Micro catheter was flushed, withdrawn into the superior mesenteric artery, and then angiogram of the collateral vasculature to the GDA was performed. No perfusion of the areae of interest observed. Limited angiogram of the right common femoral artery performed. Exoseal was deployed for closure. Patient tolerated the procedure well and remained hemodynamically stable throughout. No complications were encountered and no significant blood loss encountered. FINDINGS: Ultrasound survey demonstrates mild atherosclerotic changes of the right common femoral artery which is widely patent. Superior mesenteric artery angiogram demonstrates patent superior mesenteric artery, patent ileal colic artery, patent arcades to the small bowel vasculature. There is engorged tortuous collateral flow to the gastroduodenal artery with retrograde filling of the celiac artery origin, compatible with celiac artery origin stenosis. Post stenotic dilation of the proximal celiac  artery. Splenic artery patent, left gastric artery patent, common hepatic artery patent, left hepatic artery and right hepatic artery patent. Large collateral vasculature contributed to the origin of gastroepiploic artery. Perfusion of the hepatic flexure was predominantly via right colic/middle colic branch. Sub selective angiogram of the colic branches demonstrated no active extravasation or pseudoaneurysm. The knee shunting observed with angio dysplasia. Given the tortuosity and the inability to place the tip of the micro catheter within distal branches, no proximal coil was deployed. Instead, course Gel-Foam embolization of the territory was elected as treatment to decrease the pressure to the territory. Puncture of the right common femoral artery. 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 Tortuosity and small vasculature precluded a distal catheter position into the territory for deposition of metallic coils. Deployment of Exoseal for hemostasis. Signed, Dulcy Fanny. Earleen Newport, DO [facility] Radiology Electronically Signed   By: Corrie Mckusick D.O.   On: 05/12/2016 08:22   Ir US Guide Vasc Access Right  Result Date: 05/12/2016 INDICATION: 82 year old male with life-threatening lower GI hemorrhage. CT angiogram demonstrates hemorrhage within the distribution the right colic artery/middle colic artery near the hepatic flexure. EXAM: SELECTIVE VISCERAL ARTERIOGRAPHY; IR EMBO ART VEN HEMORR LYMPH  EXTRAV INC GUIDE ROADMAPPING; IR ULTRASOUND GUIDANCE VASC ACCESS RIGHT; ADDITIONAL ARTERIOGRAPHY; ARTERIOGRAPHY MEDICATIONS: 100 mcg nitro, 4 mg IV Zofran. The antibiotic was administered within 1 hour of the procedure ANESTHESIA/SEDATION: Moderate (conscious) sedation was employed during this procedure. A total of Versed 2.0 mg and  Fentanyl 100 mcg was administered intravenously. Moderate Sedation Time: 90 minutes. The patient's level of consciousness and vital signs were monitored continuously by radiology nursing throughout the procedure under my direct supervision. CONTRAST:  160 cc Isovue FLUOROSCOPY TIME:  Fluoroscopy Time: 16 minutes 0 seconds (31.3 mGy). COMPLICATIONS: None PROCEDURE: Informed consent was obtained from the patient following explanation of the procedure, risks, benefits and alternatives. The patient understands, agrees and consents for the procedure. All questions were addressed. A time out was performed prior to the initiation of the procedure. Maximal barrier sterile technique utilized including caps, mask, sterile gowns, sterile gloves, large sterile drape, hand hygiene, and Betadine prep. Ultrasound survey of the right inguinal region was performed with images stored and sent to PACs. A micropuncture needle was used access the right common femoral artery under ultrasound. With excellent arterial blood flow returned, and an .018 micro wire was passed through the needle, observed enter the abdominal aorta under fluoroscopy. The needle was removed, and a micropuncture sheath was placed over the wire. The inner dilator and wire were removed, and an 035 Bentson wire was advanced under fluoroscopy into the abdominal aorta. The sheath was removed and a standard 5 [place] vascular sheath was placed. The dilator was removed and the sheath was flushed. Standard 5 French C2 Cobra catheter was advanced over the [place] wire. Cobra catheter was used to select the superior mesenteric artery. Angiogram was performed. C2 Cobra was exchanged over the B[place] wire for [place] catheter. Micro catheter system was then used through the [place] catheter with 0.016 micro wire and 135cm Lantern micro catheter. Right/middle hepatic artery was then selected as the vasculature perfusing the area of interest. Sub selective angiogram  performed of branches of the right/ middle artery. Given the significant tortuosity of the vasculature and small caliber, micro catheter was not able to advance to a distal position. Angiogram demonstrated no evidence of active extravasation. 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 Single Gel-Foam pledget infused into target artery of interest, and then withdrawal of the catheter in repeat angiogram demonstrated small additional dysplastic branches at the hepatic flexure. A course Gel-Foam slurry was then infused with no significant filling at the completion. Micro catheter was flushed, withdrawn into the superior mesenteric artery, and then angiogram of the collateral vasculature to the GDA was performed. No perfusion of the areae of interest observed. Limited angiogram of the right common femoral artery performed. Exoseal was deployed for closure. Patient tolerated the procedure well and remained hemodynamically stable throughout. No complications were encountered and no significant blood loss encountered. FINDINGS: Ultrasound survey demonstrates mild atherosclerotic changes of the right common femoral artery which is widely patent. Superior mesenteric artery angiogram demonstrates patent superior mesenteric artery, patent ileal colic artery, patent arcades to the small bowel vasculature. 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 Splenic artery patent, left gastric artery patent, common hepatic artery patent, left hepatic artery and right hepatic artery patent. Large collateral vasculature contributed to the origin of gastroepiploic artery. Perfusion of the hepatic flexure was predominantly via right colic/middle colic branch. Sub selective  angiogram of the colic branches demonstrated no active extravasation or  pseudoaneurysm. The knee shunting observed with angio dysplasia. Given the tortuosity and the inability to place the tip of the micro catheter within distal branches, no proximal coil was deployed. Instead, course Gel-Foam embolization of the territory was elected as treatment to decrease the pressure to the territory. Puncture of the right common femoral artery. 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 Tortuosity and small vasculature precluded a distal catheter position into the territory for deposition of metallic coils. Deployment of Exoseal for hemostasis. Signed, Dulcy Fanny. Earleen Newport, DO [facility] Radiology Electronically Signed   By: Corrie Mckusick D.O.   On: 05/12/2016 08:22   Dg Chest Port 1 View  Result Date: 05/13/2016 CLINICAL DATA:  Dyspnea. EXAM: PORTABLE CHEST 1 VIEW COMPARISON:  Radiographs of Nov 26, 2008. FINDINGS: Stable cardiomediastinal silhouette. Atherosclerosis of thoracic aorta is noted. No pneumothorax is noted. Right lung is clear. Mild left basilar atelectasis or infiltrate is noted with minimal associated pleural effusion. Bony thorax is unremarkable. IMPRESSION: Aortic atherosclerosis. Mild left basilar atelectasis or infiltrate is noted with minimal associated pleural effusion. Electronically Signed   By: Marijo Conception, M.D.   On: 05/13/2016 07:30   Dg Knee Left Port  Result Date: 05/12/2016 CLINICAL DATA:  Recent fall.  Pain . EXAM: PORTABLE LEFT KNEE - 1-2 VIEW COMPARISON:  No recent prior. FINDINGS: Mild tricompartment degenerative change. No acute bony or joint abnormality identified. No evidence of fracture or dislocation. IMPRESSION: Mild tricompartment degenerative change.  No acute abnormality Electronically Signed   By: Marcello Moores  Register   On: 05/12/2016 07:32   Ir Ileana Ladd Art  [place] Guide Roadmapping  Result Date: 05/12/2016 INDICATION: 82 year old male with life-threatening lower GI hemorrhage. CT angiogram demonstrates hemorrhage within the distribution the right colic artery/middle colic artery near the hepatic flexure. EXAM: SELECTIVE VISCERAL ARTERIOGRAPHY; IR EMBO ART VEN HEMORR LYMPH EXTRAV INC GUIDE ROADMAPPING; IR ULTRASOUND GUIDANCE VASC ACCESS RIGHT; ADDITIONAL ARTERIOGRAPHY; ARTERIOGRAPHY MEDICATIONS: 100 mcg nitro, 4 mg IV Zofran. The antibiotic was administered within 1 hour of the procedure ANESTHESIA/SEDATION: Moderate (conscious) sedation was employed during this procedure. A total of Versed 2.0 mg and Fentanyl 100 mcg was administered intravenously. Moderate Sedation Time: 90 minutes. The patient's level of consciousness and vital signs were monitored continuously by radiology nursing throughout the procedure under my direct supervision. CONTRAST:  160 cc Isovue FLUOROSCOPY TIME:  Fluoroscopy Time: 16 minutes 0 seconds (31.3 mGy). COMPLICATIONS: None PROCEDURE: Informed consent was obtained from the patient following explanation of the procedure, risks, benefits and alternatives. The patient understands, agrees and consents for the procedure. All questions were addressed. A time out was performed prior to the initiation of the procedure. Maximal barrier sterile technique utilized including caps, mask, sterile gowns, sterile gloves, large sterile drape, hand hygiene, and Betadine prep. Ultrasound survey of the right inguinal region was performed with images stored and sent to PACs. A micropuncture needle was used access the right common femoral artery under ultrasound. With excellent arterial blood flow returned, and an .018 micro wire was passed through the needle, observed enter the abdominal aorta under fluoroscopy. The needle was removed, and a micropuncture sheath was placed over the wire. The inner dilator and wire were removed, and an 035  Bentson wire was advanced under fluoroscopy into the abdominal aorta. The  sheath was removed and a standard 5 [place] vascular sheath was placed. The dilator was removed and the sheath was flushed. Standard 5 French C2 Cobra catheter was advanced over the [place] wire. Cobra catheter was used to select the superior mesenteric artery. Angiogram was performed. C2 Cobra was exchanged over the [place]on wire for [place] catheter. Micro catheter system was then used through the [place] catheter with 0.016 micro wire and 135cm [place] micro catheter. Right/middle hepatic artery was then selected as the vasculature perfusing the area of interest. Sub selective angiogram performed of branches of the right/ middle artery. Given the significant tortuosity of the vasculature and small caliber, micro catheter was not able to advance to a distal position. Angiogram demonstrated no evidence of active extravasation. 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 Single Gel-Foam pledget infused into target artery of interest, and then withdrawal of the catheter in repeat angiogram demonstrated small additional dysplastic branches at the hepatic flexure. A course Gel-Foam slurry was then infused with no significant filling at the completion. Micro catheter was flushed, withdrawn into the superior mesenteric artery, and then angiogram of the collateral vasculature to the GDA was performed. No perfusion of the areae of interest observed. Limited angiogram of the right common femoral artery performed. Exoseal was deployed for closure. Patient tolerated the procedure well and remained hemodynamically stable throughout. No complications were encountered and no significant blood loss encountered. FINDINGS: Ultrasound survey demonstrates mild atherosclerotic changes of the right common femoral artery which is widely patent. Superior mesenteric artery angiogram demonstrates patent superior mesenteric artery, patent ileal colic artery,  patent arcades to the small bowel vasculature. 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 Splenic artery patent, left gastric artery patent, common hepatic artery patent, left hepatic artery and right hepatic artery patent. Large collateral vasculature contributed to the origin of gastroepiploic artery. Perfusion of the hepatic flexure was predominantly via right colic/middle colic branch. Sub selective angiogram of the colic branches demonstrated no active extravasation or pseudoaneurysm. The knee shunting observed with angio dysplasia. Given the tortuosity and the inability to place the tip of the micro catheter within distal branches, no proximal coil was deployed. Instead, course Gel-Foam embolization of the territory was elected as treatment to decrease the pressure to the territory. Puncture of the right common femoral artery. 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 Tortuosity and small vasculature precluded a distal catheter position into the territory for deposition of metallic coils. Deployment of Exoseal for hemostasis. Signed, Dulcy Fanny. Earleen Newport, DO [facility] Radiology Electronically Signed   By: Corrie Mckusick D.O.   On: 05/12/2016 08:22   Ct Angio Abd/pel W And/or Wo Contrast  Result Date: 05/11/2016 CLINICAL DATA:  Acute onset of syncope and bright red rectal bleeding. Lightheadedness and generalized weakness. Initial encounter. EXAM: CTA ABDOMEN AND PELVIS wITHOUT AND WITH CONTRAST TECHNIQUE: Multidetector CT imaging of the abdomen and pelvis was performed using the standard protocol during bolus administration of intravenous  contrast. Multiplanar reconstructed images and MIPs were obtained and reviewed to evaluate the vascular anatomy. CONTRAST:  75 mL of Isovue 370 IV contrast COMPARISON:  None. FINDINGS: VASCULAR Aorta: Scattered calcification is seen along the abdominal aorta, without significant luminal narrowing. 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 SMA: The superior mesenteric artery appears patent, without evidence of luminal narrowing. Renals: Mild calcification is noted at the proximal renal arteries bilaterally, without significant luminal narrowing. IMA: The inferior mesenteric artery remains patent. Inflow: Scattered calcification is seen along the common and internal iliac arteries. The external iliac arteries and common femoral arteries remain fully patent bilaterally, with mild calcification at the right common femoral artery. Proximal Outflow: Minimal mural thrombus is suggested at the proximal left superficial femoral artery. Minimal mural thrombus is suggested at the proximal right profunda femoris artery. Veins: Visualized venous structures are grossly unremarkable. The inferior vena cava is partially decompressed and unremarkable in appearance. Review of the MIP images confirms the above findings. NON-VASCULAR Lower chest: Mild bibasilar atelectasis or scarring is noted. Diffuse coronary artery calcifications are seen. The visualized portions of the mediastinum are otherwise unremarkable. Hepatobiliary: The liver is unremarkable in appearance. The gallbladder is unremarkable in appearance. The common bile duct remains normal in caliber. Pancreas: The pancreas is within normal limits. Spleen: The spleen is unremarkable in appearance. Adrenals/Urinary Tract: The adrenal glands are unremarkable in appearance. A large 7.5 cm cyst is noted at the upper pole of the left kidney. Mild nonspecific perinephric stranding is noted bilaterally.  Additional scattered small bilateral renal cysts are seen. Mild left-sided renal pelvicaliectasis remains within normal limits, without evidence of significant hydronephrosis. No renal or ureteral stones are identified. Stomach/Bowel: The stomach is unremarkable in appearance. The small bowel is within normal limits. The appendix is not visualized; there is no evidence for appendicitis. Scattered diverticulosis is noted along the proximal sigmoid colon, without evidence of diverticulitis. There is a large amount of acute extravasation of contrast at the ascending colon, just proximal to the hepatic flexure of the colon, compatible with active intraluminal hemorrhage. This explains the patient's bright red blood per rectum. Lymphatic: Retroperitoneal nodes are grossly unremarkable in appearance. No pelvic sidewall lymphadenopathy is appreciated. Reproductive: The bladder is mildly distended and grossly unremarkable. The prostate remains normal in size. Other: No additional soft tissue abnormalities are seen. Musculoskeletal: No acute osseous abnormalities are identified. Vacuum phenomenon is noted at L5-S1. Underlying facet disease is noted. The visualized musculature is unremarkable in appearance. IMPRESSION: VASCULAR 1. Large amount of acute extravasation of contrast at the ascending colon, just proximal to the hepatic flexure of the colon, compatible with active intraluminal hemorrhage. This explains the patient's bright red blood per rectum. 2. Scattered aortic atherosclerosis noted. 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 3. Diffuse coronary artery calcifications seen. NON-VASCULAR 1. Mild bibasilar atelectasis or scarring noted. 2. Scattered bilateral renal cysts, measuring up to 7.5 cm on the left. 3. Scattered diverticulosis along the proximal sigmoid colon, without evidence of diverticulitis. These results were called by telephone at the time  of interpretation on 05/11/2016 at 9:58 pm to Dr. Carrie Mew, who verbally acknowledged these results. Electronically Signed   By: Garald Balding M.D.   On: 05/11/2016 22:04    Labs:  CBC:  Recent Labs  05/12/16 1057 05/12/16 1607 05/12/16 2238 05/13/16 0222  WBC 15.0* 23.4* 48.5* 49.6*  HGB 10.3* 9.6* 8.7* 8.4*  HCT 29.6* 27.5* 25.3* 24.4*  PLT 52* 68* 80* 69*    COAGS:  Recent Labs  05/11/16 1741  INR 1.25    BMP:  Recent Labs  05/11/16 1741 05/12/16 0355 05/13/16 0222  NA 138 143 141  K 3.5 4.8 3.9  CL  111 119* 117*  CO2 23 20* 15*  GLUCOSE 217* 141* 137*  BUN 26* 22* 38*  CALCIUM 7.9* 7.4* 7.9*  CREATININE 1.09 1.21 1.95*  GFRNONAA 58* 51* 29*  GFRAA >60 59* 33*    LIVER FUNCTION TESTS:  Recent Labs  05/11/16 1741  BILITOT 0.5  AST 22  ALT 9*  ALKPHOS 48  PROT 5.1*  ALBUMIN 3.2*    Assessment and Plan:  S/p embolization 10/31 -colonoscopy scheduled 11/2 -IR will continue to follow as needed  Electronically Signed: Veer Elamin A 05/13/2016, 12:02 PM   I spent a total of 15 Minutes at the the patient's bedside AND on the patient's hospital floor or unit, greater than 50% of which was counseling/coordinating care for mesenteric embolization.

## 2016-05-13 NOTE — Progress Notes (Signed)
Blood transfusion order clarified with Dr Maryland Pink, MD ordered to give 2 units then check CBC

## 2016-05-13 NOTE — Progress Notes (Signed)
RN paged Dustin Howell regarding pt WBC 49.6 this morning. This is a significant increase compared to previous labs. [facility] RN Nicki notified. RN will continue to monitor.  Arnell Sieving, RN

## 2016-05-13 NOTE — Progress Notes (Signed)
[facility] PROGRESS NOTE  Dustin Howell ZV:[id number] DOB: 06-05-1925 DOA: 05/11/2016  PCP: [facility]  Brief History/Interval Summary: 82 year old Caucasian male with no significant past medical history and takes only a baby aspirin at home, presented to the emergency department at [facility] with complains of lower GI bleeding. Patient also had an episode of syncope. He was noted to be hypotensive. Patient was aggressively resuscitated with blood transfusions along with FFP and platelets. He underwent CT angiogram of the abdomen which showed a local area of bleeding. Patient was transferred over to [facility] and seen by interventional radiology and underwent embolization.  Reason for Visit: Lower GI bleed  Consultants: Critical care medicine. Gastroenterology. Interventional radiology.  Procedures: Mesenteric angiogram with Gelfoam embolization of the distal branch of the middle colic artery at the hepatic flexure.  Antibiotics: Vancomycin and Zosyn initiated on 11/1  Subjective/Interval History: Overall, patient feels better. Continues to have some discomfort in his lower abdomen. He denies any cough or shortness breath. Denies any difficulty urinating. Denies any diarrhea. Hasn't had any further episodes of rectal bleeding. Has been making urine.  ROS: Denies any nausea or vomiting  Objective:  Vital Signs  Vitals:   05/13/16 0750 05/13/16 0800 05/13/16 0900 05/13/16 1000  BP:  (!) 138/59 135/84 (!) 142/71  Pulse:  (!) 101 (!) 106 (!) 107  Resp:  (!) 21 (!) 22 (!) 24  Temp: 98.6 F (37 C)     TempSrc: Oral     SpO2:  100% 99% (!) 86%  Weight:      Height:        Intake/Output Summary (Last 24 hours) at 05/13/16 1136 Last data filed at 05/13/16 1000  Gross per 24 hour  Intake          1346.25 ml  Output              700 ml  Net           646.25 ml   Filed Weights   05/11/16 2245 05/12/16 0302 05/13/16 0416  Weight: 55.6 kg (122 lb 9.2  oz) 53.3 kg (117 lb 8.1 oz) 54.9 kg (121 lb 0.5 oz)    General appearance: alert, cooperative, appears stated age and no distress Head: Normocephalic, without obvious abnormality, atraumatic Resp: Diminished air entry at the bases. No wheezing, rales or rhonchi. Cardio: regular rate and rhythm, S1, S2 normal, no murmur, click, rub or gallop GI: Abdomen is soft. Mildly tender in the lower quadrants bilaterally, without any rebound, rigidity or guarding. No masses or organomegaly. Bowel sounds are present and normal. Extremities: extremities normal, atraumatic, no cyanosis or edema Pulses: 2+ and symmetric Neurologic: Awake and alert. Oriented x 3. No focal neurological deficits.  Lab Results:  Data Reviewed: I have personally reviewed following labs and imaging studies  CBC:  Recent Labs Lab 05/11/16 1741  05/12/16 0355 05/12/16 1057 05/12/16 1607 05/12/16 2238 05/13/16 0222  WBC 16.5*  < > 15.9* 15.0* 23.4* 48.5* 49.6*  NEUTROABS 5.6  --   --   --   --   --   --   HGB 7.8*  < > 10.2* 10.3* 9.6* 8.7* 8.4*  HCT 23.6*  < > 29.2* 29.6* 27.5* 25.3* 24.4*  MCV 93.5  < > 86.1 83.9 83.8 85.5 87.5  PLT 114*  < > 41* 52* 68* 80* 69*  < > = values in this interval not displayed.  Basic Metabolic Panel:  Recent Labs Lab  05/11/16 1741 05/12/16 0355 05/12/16 1057 05/13/16 0222  NA 138 143  --  141  K 3.5 4.8  --  3.9  CL 111 119*  --  117*  CO2 23 20*  --  15*  GLUCOSE 217* 141*  --  137*  BUN 26* 22*  --  38*  CREATININE 1.09 1.21  --  1.95*  CALCIUM 7.9* 7.4*  --  7.9*  MG  --  1.6*  --  2.3  PHOS  --  4.2 5.1*  --     GFR: Estimated Creatinine Clearance: 19.9 mL/min (by C-G formula based on SCr of 1.95 mg/dL (H)).  Liver Function Tests:  Recent Labs Lab 05/11/16 1741  AST 22  ALT 9*  ALKPHOS 48  BILITOT 0.5  PROT 5.1*  ALBUMIN 3.2*     Recent Labs Lab 05/11/16 1741  LIPASE 37   Coagulation Profile:  Recent Labs Lab 05/11/16 1741  INR 1.25     Cardiac Enzymes:  Recent Labs Lab 05/11/16 1741 05/12/16 0355 05/12/16 1057 05/12/16 1607 05/12/16 2238  TROPONINI <0.03 0.06* 0.05* 0.06* 0.11*    CBG:  Recent Labs Lab 05/11/16 2244  GLUCAP 143*     Recent Results (from the past 240 hour(s))  MRSA PCR Screening     Status: None   Collection Time: 05/11/16 10:38 PM  Result Value Ref Range Status   MRSA by PCR NEGATIVE NEGATIVE Final    Comment:        The GeneXpert MRSA Assay (FDA approved for NASAL specimens only), is one component of a comprehensive MRSA colonization surveillance program. It is not intended to diagnose MRSA infection nor to guide or monitor treatment for MRSA infections.       Radiology Studies: Ir Angiogram Visceral Selective  Result Date: 05/12/2016 INDICATION: 82 year old male with life-threatening lower GI hemorrhage. CT angiogram demonstrates hemorrhage within the distribution the right colic artery/middle colic artery near the hepatic flexure. EXAM: SELECTIVE VISCERAL ARTERIOGRAPHY; IR EMBO ART VEN HEMORR LYMPH EXTRAV INC GUIDE ROADMAPPING; IR ULTRASOUND GUIDANCE VASC ACCESS RIGHT; ADDITIONAL ARTERIOGRAPHY; ARTERIOGRAPHY MEDICATIONS: 100 mcg nitro, 4 mg IV Zofran. The antibiotic was administered within 1 hour of the procedure ANESTHESIA/SEDATION: Moderate (conscious) sedation was employed during this procedure. A total of Versed 2.0 mg and Fentanyl 100 mcg was administered intravenously. Moderate Sedation Time: 90 minutes. The patient's level of consciousness and vital signs were monitored continuously by radiology nursing throughout the procedure under my direct supervision. CONTRAST:  160 cc Isovue FLUOROSCOPY TIME:  Fluoroscopy Time: 16 minutes 0 seconds (31.3 mGy). COMPLICATIONS: None PROCEDURE: Informed consent was obtained from the patient following explanation of the procedure, risks, benefits and alternatives. The patient understands, agrees and consents for the procedure. All  questions were addressed. A time out was performed prior to the initiation of the procedure. Maximal barrier sterile technique utilized including caps, mask, sterile gowns, sterile gloves, large sterile drape, hand hygiene, and Betadine prep. Ultrasound survey of the right inguinal region was performed with images stored and sent to PACs. A micropuncture needle was used access the right common femoral artery under ultrasound. With excellent arterial blood flow returned, and an .018 micro wire was passed through the needle, observed enter the abdominal aorta under fluoroscopy. The needle was removed, and a micropuncture sheath was placed over the wire. The inner dilator and wire were removed, and an 035 Bentson wire was advanced under fluoroscopy into the abdominal aorta. The sheath was removed and a standard 5 [place] vascular  sheath was placed. The dilator was removed and the sheath was flushed. Standard 5 French C2 Cobra catheter was advanced over the [place] wire. Cobra catheter was used to select the superior mesenteric artery. Angiogram was performed. C2 Cobra was exchanged over the [place] wire for [place] catheter. Micro catheter system was then used through the [place] catheter with 0.016 micro wire and 135cm [place] micro catheter. Right/middle hepatic artery was then selected as the vasculature perfusing the area of interest. Sub selective angiogram performed of branches of the right/ middle artery. Given the significant tortuosity of the vasculature and small caliber, micro catheter was not able to advance to a distal position. Angiogram demonstrated no evidence of active extravasation. 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 Single Gel-Foam pledget infused into target artery of interest, and then withdrawal of the catheter in repeat angiogram demonstrated small additional dysplastic branches at the hepatic flexure. A course Gel-Foam slurry was then infused with no significant filling at the  completion. Micro catheter was flushed, withdrawn into the superior mesenteric artery, and then angiogram of the collateral vasculature to the GDA was performed. No perfusion of the areae of interest observed. Limited angiogram of the right common femoral artery performed. Exoseal was deployed for closure. Patient tolerated the procedure well and remained hemodynamically stable throughout. No complications were encountered and no significant blood loss encountered. FINDINGS: Ultrasound survey demonstrates mild atherosclerotic changes of the right common femoral artery which is widely patent. Superior mesenteric artery angiogram demonstrates patent superior mesenteric artery, patent ileal colic artery, patent arcades to the small bowel vasculature. 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 Splenic artery patent, left gastric artery patent, common hepatic artery patent, left hepatic artery and right hepatic artery patent. Large collateral vasculature contributed to the origin of gastroepiploic artery. Perfusion of the hepatic flexure was predominantly via right colic/middle colic branch. Sub selective angiogram of the colic branches demonstrated no active extravasation or pseudoaneurysm. The knee shunting observed with angio dysplasia. Given the tortuosity and the inability to place the tip of the micro catheter within distal branches, no proximal coil was deployed. Instead, course Gel-Foam embolization of the territory was elected as treatment to decrease the pressure to the territory. Puncture of the right common femoral artery. 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 Tortuosity and small  vasculature precluded a distal catheter position into the territory for deposition of metallic coils. Deployment of Exoseal for hemostasis. Signed, Dulcy Fanny. Earleen Newport, DO [facility] Radiology Electronically Signed   By: Corrie Mckusick D.O.   On: 05/12/2016 08:22   Ir Angiogram Selective Each Additional Vessel  Result Date: 05/12/2016 INDICATION: 82 year old male with life-threatening lower GI hemorrhage. CT angiogram demonstrates hemorrhage within the distribution the right colic artery/middle colic artery near the hepatic flexure. EXAM: SELECTIVE VISCERAL ARTERIOGRAPHY; IR EMBO ART VEN HEMORR LYMPH EXTRAV INC GUIDE ROADMAPPING; IR ULTRASOUND GUIDANCE VASC ACCESS RIGHT; ADDITIONAL ARTERIOGRAPHY; ARTERIOGRAPHY MEDICATIONS: 100 mcg nitro, 4 mg IV Zofran. The antibiotic was administered within 1 hour of the procedure ANESTHESIA/SEDATION: Moderate (conscious) sedation was employed during this procedure. A total of Versed 2.0 mg and Fentanyl 100 mcg was administered intravenously. Moderate Sedation Time: 90 minutes. The patient's level of consciousness and vital signs were monitored continuously by radiology  nursing throughout the procedure under my direct supervision. CONTRAST:  160 cc Isovue FLUOROSCOPY TIME:  Fluoroscopy Time: 16 minutes 0 seconds (31.3 mGy). COMPLICATIONS: None PROCEDURE: Informed consent was obtained from the patient following explanation of the procedure, risks, benefits and alternatives. The patient understands, agrees and consents for the procedure. All questions were addressed. A time out was performed prior to the initiation of the procedure. Maximal barrier sterile technique utilized including caps, mask, sterile gowns, sterile gloves, large sterile drape, hand hygiene, and Betadine prep. Ultrasound survey of the right inguinal region was performed with images stored and sent to PACs. A micropuncture needle was used access the right common  femoral artery under ultrasound. With excellent arterial blood flow returned, and an .018 micro wire was passed through the needle, observed enter the abdominal aorta under fluoroscopy. The needle was removed, and a micropuncture sheath was placed over the wire. The inner dilator and wire were removed, and an 035 Bentson wire was advanced under fluoroscopy into the abdominal aorta. The sheath was removed and a standard 5 [place] vascular sheath was placed. The dilator was removed and the sheath was flushed. Standard 5 French C2 Cobra catheter was advanced over the [place] wire. Cobra catheter was used to select the superior mesenteric artery. Angiogram was performed. C2 Cobra was exchanged over the B[place] wire for [place] catheter. Micro catheter system was then used through the [place] catheter with 0.016 micro wire and 135cm [place] micro catheter. Right/middle hepatic artery was then selected as the vasculature perfusing the area of interest. Sub selective angiogram performed of branches of the right/ middle artery. Given the significant tortuosity of the vasculature and small caliber, micro catheter was not able to advance to a distal position. Angiogram demonstrated no evidence of active extravasation. 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 Single Gel-Foam pledget infused into target artery of interest, and then withdrawal of the catheter in repeat angiogram demonstrated small additional dysplastic branches at the hepatic flexure. A course Gel-Foam slurry was then infused with no significant filling at the completion. Micro catheter was flushed, withdrawn into the superior mesenteric artery, and then angiogram of the collateral vasculature to the GDA was performed. No perfusion of the areae of interest observed. Limited angiogram of the right common femoral artery performed. Exoseal was deployed for closure. Patient tolerated the procedure well and remained hemodynamically stable throughout. No  complications were encountered and no significant blood loss encountered. FINDINGS: Ultrasound survey demonstrates mild atherosclerotic changes of the right common femoral artery which is widely patent. Superior mesenteric artery angiogram demonstrates patent superior mesenteric artery, patent ileal colic artery, patent arcades to the small bowel vasculature. 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 Splenic artery patent, left gastric artery patent, common hepatic artery patent, left hepatic artery and right hepatic artery patent. Large collateral vasculature contributed to the origin of gastroepiploic artery. Perfusion of the hepatic flexure was predominantly via right colic/middle colic branch. Sub selective angiogram of the colic branches demonstrated no active extravasation or pseudoaneurysm. The knee shunting observed with angio dysplasia. Given the tortuosity and the inability to place the tip of the micro catheter within distal branches, no proximal coil was deployed. Instead, course Gel-Foam embolization of the territory was elected as treatment to decrease the pressure to the territory. Puncture of the right common femoral artery. 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 Tortuosity and small vasculature precluded a distal catheter position into the territory for deposition of metallic coils. Deployment of Exoseal for hemostasis. Signed, Dulcy Fanny. Earleen Newport, DO [facility] Radiology Electronically Signed   By: Corrie Mckusick D.O.   On: 05/12/2016 08:22   Ir Angiogram Selective Each Additional Vessel  Result Date: 05/12/2016 INDICATION:  82 year old male with life-threatening lower GI hemorrhage. CT angiogram demonstrates hemorrhage within the distribution the right colic artery/middle colic artery near the hepatic flexure. EXAM: SELECTIVE VISCERAL ARTERIOGRAPHY; IR EMBO ART VEN HEMORR LYMPH EXTRAV INC GUIDE ROADMAPPING; IR ULTRASOUND GUIDANCE VASC ACCESS RIGHT; ADDITIONAL ARTERIOGRAPHY; ARTERIOGRAPHY MEDICATIONS: 100 mcg nitro, 4 mg IV Zofran. The antibiotic was administered within 1 hour of the procedure ANESTHESIA/SEDATION: Moderate (conscious) sedation was employed during this procedure. A total of Versed 2.0 mg and Fentanyl 100 mcg was administered intravenously. Moderate Sedation Time: 90 minutes. The patient's level of consciousness and vital signs were monitored continuously by radiology nursing throughout the procedure under my direct supervision. CONTRAST:  160 cc Isovue FLUOROSCOPY TIME:  Fluoroscopy Time: 16 minutes 0 seconds (31.3 mGy). COMPLICATIONS: None PROCEDURE: Informed consent was obtained from the patient following explanation of the procedure, risks, benefits and alternatives. The patient understands, agrees and consents for the procedure. All questions were addressed. A time out was performed prior to the initiation of the procedure. Maximal barrier sterile technique utilized including caps, mask, sterile gowns, sterile gloves, large sterile drape, hand hygiene, and Betadine prep. Ultrasound survey of the right inguinal region was performed with images stored and sent to PACs. A micropuncture needle was used access the right common femoral artery under ultrasound. With excellent arterial blood flow returned, and an .018 micro wire was passed through the needle, observed enter the abdominal aorta under fluoroscopy. The needle was removed, and a micropuncture sheath was placed over the wire. The inner dilator and wire were removed, and an 035 Bentson wire was advanced under fluoroscopy into the abdominal aorta. The sheath was  removed and a standard 5 [place] vascular sheath was placed. The dilator was removed and the sheath was flushed. Standard 5 French C2 Cobra catheter was advanced over the [place] wire. Cobra catheter was used to select the superior mesenteric artery. Angiogram was performed. C2 Cobra was exchanged over the [place] wire for [place] catheter. Micro catheter system was then used through the [place] catheter with 0.016 micro wire and 135cm Lantern micro catheter. Right/middle hepatic artery was then selected as the vasculature perfusing the area of interest. Sub selective angiogram performed of branches of the right/ middle artery. Given the significant tortuosity of the vasculature and small caliber, micro catheter was not able to advance to a distal position. Angiogram demonstrated no evidence of active extravasation. 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 Single Gel-Foam pledget infused into target artery of interest, and then withdrawal of the catheter in repeat angiogram demonstrated small additional dysplastic branches at the hepatic flexure. A course Gel-Foam slurry was then infused with no significant filling at the completion. Micro catheter was flushed, withdrawn into the superior mesenteric artery, and then angiogram of the collateral vasculature to the GDA was performed. No perfusion of the areae of interest observed. Limited angiogram of the right common femoral artery performed. Exoseal was deployed for closure. Patient tolerated the procedure well and remained hemodynamically stable throughout. No complications were encountered and no significant blood loss encountered.  FINDINGS: Ultrasound survey demonstrates mild atherosclerotic changes of the right common femoral artery which is widely patent. Superior mesenteric artery angiogram demonstrates patent superior mesenteric artery, patent ileal colic artery, patent arcades to the small bowel vasculature. 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 Splenic artery patent, left gastric artery patent, common hepatic artery patent, left hepatic artery and right hepatic artery patent. Large collateral vasculature contributed to the origin of gastroepiploic artery. Perfusion of the hepatic flexure was predominantly via right colic/middle colic branch. Sub selective angiogram of the colic branches demonstrated no active extravasation or pseudoaneurysm. The knee shunting observed with angio dysplasia. Given the tortuosity and the inability to place the tip of the micro catheter within distal branches, no proximal coil was deployed. Instead, course Gel-Foam embolization of the territory was elected as treatment to decrease the pressure to the territory. Puncture of the right common femoral artery. 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 Tortuosity and small vasculature precluded a distal catheter position into the territory for deposition of metallic coils. Deployment of Exoseal for hemostasis. Signed, Dulcy Fanny. Earleen Newport, DO [facility] Radiology Electronically Signed   By: Corrie Mckusick D.O.   On: 05/12/2016 08:22   Ir Angiogram Selective Each Additional Vessel  Result Date: 05/12/2016 INDICATION: 82 year old male with life-threatening lower GI hemorrhage. CT angiogram demonstrates hemorrhage within the distribution the right colic artery/middle colic artery near the hepatic flexure. EXAM: SELECTIVE VISCERAL ARTERIOGRAPHY; IR EMBO ART VEN HEMORR LYMPH EXTRAV INC GUIDE ROADMAPPING; IR ULTRASOUND GUIDANCE VASC ACCESS RIGHT; ADDITIONAL ARTERIOGRAPHY; ARTERIOGRAPHY MEDICATIONS: 100 mcg nitro, 4  mg IV Zofran. The antibiotic was administered within 1 hour of the procedure ANESTHESIA/SEDATION: Moderate (conscious) sedation was employed during this procedure. A total of Versed 2.0 mg and Fentanyl 100 mcg was administered intravenously. Moderate Sedation Time: 90 minutes. The patient's level of consciousness and vital signs were monitored continuously by radiology nursing throughout the procedure under my direct supervision. CONTRAST:  160 cc Isovue FLUOROSCOPY TIME:  Fluoroscopy Time: 16 minutes 0 seconds (31.3 mGy). COMPLICATIONS: None PROCEDURE: Informed consent was obtained from the patient following explanation of the procedure, risks, benefits and alternatives. The patient understands, agrees and consents for the procedure. All questions were addressed. A time out was performed prior to the initiation of the procedure. Maximal barrier sterile technique utilized including caps, mask, sterile gowns, sterile gloves, large sterile drape, hand hygiene, and Betadine prep. Ultrasound survey of the right inguinal region was performed with images stored and sent to PACs. A micropuncture needle was used access the right common femoral artery under ultrasound. With excellent arterial blood flow returned, and an .018 micro wire was passed through the needle, observed enter the abdominal aorta under fluoroscopy. The needle was removed, and a micropuncture sheath was placed over the wire. The inner dilator and wire were removed, and an 035 Bentson wire was advanced under fluoroscopy into the abdominal aorta. The sheath was removed and a standard 5 [place] vascular sheath was placed. The dilator was removed and the sheath was flushed. Standard 5 French C2 Cobra catheter was advanced over the [place] wire. Cobra catheter was used to select the superior mesenteric artery. Angiogram was performed. C2 Cobra was exchanged over the [place] wire for [place] catheter. Micro  catheter system was then used through the [facility]  catheter with 0.016 micro wire and 135cm [place] micro catheter. Right/middle hepatic artery was then selected as the vasculature perfusing the area of interest. Sub selective angiogram performed of branches of the right/ middle artery. Given the significant tortuosity of the vasculature and small caliber, micro catheter was not able to advance to a distal position. Angiogram demonstrated no evidence of active extravasation. 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 Single Gel-Foam pledget infused into target artery of interest, and then withdrawal of the catheter in repeat angiogram demonstrated small additional dysplastic branches at the hepatic flexure. A course Gel-Foam slurry was then infused with no significant filling at the completion. Micro catheter was flushed, withdrawn into the superior mesenteric artery, and then angiogram of the collateral vasculature to the GDA was performed. No perfusion of the areae of interest observed. Limited angiogram of the right common femoral artery performed. Exoseal was deployed for closure. Patient tolerated the procedure well and remained hemodynamically stable throughout. No complications were encountered and no significant blood loss encountered. FINDINGS: Ultrasound survey demonstrates mild atherosclerotic changes of the right common femoral artery which is widely patent. Superior mesenteric artery angiogram demonstrates patent superior mesenteric artery, patent ileal colic artery, patent arcades to the small bowel vasculature. 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 Splenic artery patent, left gastric artery patent, common hepatic artery patent, left hepatic artery and right hepatic artery patent. Large collateral vasculature contributed to the origin of gastroepiploic artery.  Perfusion of the hepatic flexure was predominantly via right colic/middle colic branch. Sub selective angiogram of the colic branches demonstrated no active extravasation or pseudoaneurysm. The knee shunting observed with angio dysplasia. Given the tortuosity and the inability to place the tip of the micro catheter within distal branches, no proximal coil was deployed. Instead, course Gel-Foam embolization of the territory was elected as treatment to decrease the pressure to the territory. Puncture of the right common femoral artery. 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 Tortuosity and small vasculature precluded a distal catheter position into the territory for deposition of metallic coils. Deployment of Exoseal for hemostasis. Signed, Dulcy Fanny. Earleen Newport, DO [facility] Radiology Electronically Signed   By: Corrie Mckusick D.O.   On: 05/12/2016 08:22   Ir Angiogram Follow Up Study  Result Date: 05/12/2016 INDICATION: 82 year old male with life-threatening lower GI hemorrhage. CT angiogram demonstrates hemorrhage within the distribution the right colic artery/middle colic artery near the hepatic flexure. EXAM: SELECTIVE VISCERAL ARTERIOGRAPHY; IR EMBO ART VEN HEMORR LYMPH EXTRAV INC GUIDE ROADMAPPING; IR ULTRASOUND GUIDANCE VASC ACCESS RIGHT; ADDITIONAL ARTERIOGRAPHY; ARTERIOGRAPHY MEDICATIONS: 100 mcg nitro, 4 mg IV Zofran. The antibiotic was administered within 1 hour of the procedure ANESTHESIA/SEDATION: Moderate (conscious) sedation was employed during this procedure. A total of Versed 2.0 mg and Fentanyl 100 mcg was administered intravenously. Moderate Sedation Time: 90 minutes. The patient's level of consciousness and vital signs were monitored continuously by radiology nursing throughout the procedure under my direct  supervision. CONTRAST:  160 cc Isovue FLUOROSCOPY TIME:  Fluoroscopy Time: 16 minutes 0 seconds (31.3 mGy). COMPLICATIONS: None PROCEDURE: Informed consent was obtained from the patient following explanation of the procedure, risks, benefits and alternatives. The patient understands, agrees and consents  for the procedure. All questions were addressed. A time out was performed prior to the initiation of the procedure. Maximal barrier sterile technique utilized including caps, mask, sterile gowns, sterile gloves, large sterile drape, hand hygiene, and Betadine prep. Ultrasound survey of the right inguinal region was performed with images stored and sent to PACs. A micropuncture needle was used access the right common femoral artery under ultrasound. With excellent arterial blood flow returned, and an .018 micro wire was passed through the needle, observed enter the abdominal aorta under fluoroscopy. The needle was removed, and a micropuncture sheath was placed over the wire. The inner dilator and wire were removed, and an 035 Bentson wire was advanced under fluoroscopy into the abdominal aorta. The sheath was removed and a standard 5 [place] vascular sheath was placed. The dilator was removed and the sheath was flushed. Standard 5 French C2 Cobra catheter was advanced over the [place] wire. Cobra catheter was used to select the superior mesenteric artery. Angiogram was performed. C2 Cobra was exchanged over the [place] wire for [place] catheter. Micro catheter system was then used through the [place] catheter with 0.016 micro wire and 135cm [place] micro catheter. Right/middle hepatic artery was then selected as the vasculature perfusing the area of interest. Sub selective angiogram performed of branches of the right/ middle artery. Given the significant tortuosity of the vasculature and small caliber, micro catheter was not able to advance to a distal position. Angiogram demonstrated no evidence of active  extravasation. 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 Single Gel-Foam pledget infused into target artery of interest, and then withdrawal of the catheter in repeat angiogram demonstrated small additional dysplastic branches at the hepatic flexure. A course Gel-Foam slurry was then infused with no significant filling at the completion. Micro catheter was flushed, withdrawn into the superior mesenteric artery, and then angiogram of the collateral vasculature to the GDA was performed. No perfusion of the areae of interest observed. Limited angiogram of the right common femoral artery performed. Exoseal was deployed for closure. Patient tolerated the procedure well and remained hemodynamically stable throughout. No complications were encountered and no significant blood loss encountered. FINDINGS: Ultrasound survey demonstrates mild atherosclerotic changes of the right common femoral artery which is widely patent. Superior mesenteric artery angiogram demonstrates patent superior mesenteric artery, patent ileal colic artery, patent arcades to the small bowel vasculature. 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 Splenic artery patent, left gastric artery patent, common hepatic artery patent, left hepatic artery and right hepatic artery patent. Large collateral vasculature contributed to the origin of gastroepiploic artery. Perfusion of the hepatic flexure was predominantly via right colic/middle colic branch. Sub selective angiogram of the colic branches demonstrated no active extravasation or pseudoaneurysm. The knee shunting observed with angio dysplasia. Given the tortuosity and the inability to place the tip of the micro catheter within distal branches, no proximal coil was deployed. Instead, course Gel-Foam embolization of  the territory was elected as treatment to decrease the pressure to the territory. Puncture of the right common femoral artery. 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 Tortuosity and small vasculature precluded a distal catheter position into the territory for deposition of metallic coils. Deployment of Exoseal for hemostasis. Signed, Dulcy Fanny. Earleen Newport DO [facility]  [facility] Radiology Electronically Signed   By: Corrie Mckusick D.O.   On: 05/12/2016 08:22   Ir US Guide Vasc Access Right  Result Date: 05/12/2016 INDICATION: 82 year old male with life-threatening lower GI hemorrhage. CT angiogram demonstrates hemorrhage within the distribution the right colic artery/middle colic artery near the hepatic flexure. EXAM: SELECTIVE VISCERAL ARTERIOGRAPHY; IR EMBO ART VEN HEMORR LYMPH EXTRAV INC GUIDE ROADMAPPING; IR ULTRASOUND GUIDANCE VASC ACCESS RIGHT; ADDITIONAL ARTERIOGRAPHY; ARTERIOGRAPHY MEDICATIONS: 100 mcg nitro, 4 mg IV Zofran. The antibiotic was administered within 1 hour of the procedure ANESTHESIA/SEDATION: Moderate (conscious) sedation was employed during this procedure. A total of Versed 2.0 mg and Fentanyl 100 mcg was administered intravenously. Moderate Sedation Time: 90 minutes. The patient's level of consciousness and vital signs were monitored continuously by radiology nursing throughout the procedure under my direct supervision. CONTRAST:  160 cc Isovue FLUOROSCOPY TIME:  Fluoroscopy Time: 16 minutes 0 seconds (31.3 mGy). COMPLICATIONS: None PROCEDURE: Informed consent was obtained from the patient following explanation of the procedure, risks, benefits and alternatives. The patient understands, agrees and consents for the procedure. All questions were addressed. A time out was performed prior to the initiation of the  procedure. Maximal barrier sterile technique utilized including caps, mask, sterile gowns, sterile gloves, large sterile drape, hand hygiene, and Betadine prep. Ultrasound survey of the right inguinal region was performed with images stored and sent to PACs. A micropuncture needle was used access the right common femoral artery under ultrasound. With excellent arterial blood flow returned, and an .018 micro wire was passed through the needle, observed enter the abdominal aorta under fluoroscopy. The needle was removed, and a micropuncture sheath was placed over the wire. The inner dilator and wire were removed, and an 035 Bentson wire was advanced under fluoroscopy into the abdominal aorta. The sheath was removed and a standard 5 [place] vascular sheath was placed. The dilator was removed and the sheath was flushed. Standard 5 French C2 Cobra catheter was advanced over the [place] wire. Cobra catheter was used to select the superior mesenteric artery. Angiogram was performed. C2 Cobra was exchanged over the Bents[place] wire for [place] catheter. Micro catheter system was then used through the [place] catheter with 0.016 micro wire and 135cm Lantern micro catheter. Right/middle hepatic artery was then selected as the vasculature perfusing the area of interest. Sub selective angiogram performed of branches of the right/ middle artery. Given the significant tortuosity of the vasculature and small caliber, micro catheter was not able to advance to a distal position. Angiogram demonstrated no evidence of active extravasation. 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 Single Gel-Foam pledget infused into target artery of interest, and then withdrawal of the catheter in repeat angiogram demonstrated small additional dysplastic branches at the hepatic flexure. A course Gel-Foam slurry was then infused with no significant filling at the completion. Micro catheter was flushed, withdrawn into the superior mesenteric  artery, and then angiogram of the collateral vasculature to the GDA was performed. No perfusion of the areae of interest observed. Limited angiogram of the right common femoral artery performed. Exoseal was deployed for closure. Patient tolerated the procedure well and remained hemodynamically stable throughout. No complications were encountered and no significant blood loss encountered. FINDINGS: Ultrasound survey demonstrates mild atherosclerotic changes of the right common femoral artery which is widely patent. Superior mesenteric artery angiogram demonstrates patent superior mesenteric artery, patent ileal colic artery, patent arcades to the small bowel vasculature. 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 Splenic artery patent, left gastric artery patent, common hepatic artery patent, left hepatic artery and right hepatic artery patent. Large collateral vasculature contributed to the origin of gastroepiploic artery. Perfusion of the hepatic flexure was predominantly via right colic/middle colic branch. Sub selective angiogram of the colic branches demonstrated no active extravasation or pseudoaneurysm. The knee shunting observed with angio dysplasia. Given the tortuosity and the inability to place the tip of the micro catheter within distal branches, no proximal coil was deployed. Instead, course Gel-Foam embolization of the territory was elected as treatment to decrease the pressure to the territory. Puncture of the right common femoral artery. 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 Tortuosity and small vasculature precluded a distal catheter position into the territory for deposition  of metallic coils. Deployment of Exoseal for hemostasis. Signed, Dulcy Fanny. Earleen Newport, DO [facility] Radiology Electronically Signed   By: Corrie Mckusick D.O.   On: 05/12/2016 08:22   Dg Chest Port 1 View  Result Date: 05/13/2016 CLINICAL DATA:  Dyspnea. EXAM: PORTABLE CHEST 1 VIEW COMPARISON:  Radiographs of Nov 26, 2008. FINDINGS: Stable cardiomediastinal silhouette. Atherosclerosis of thoracic aorta is noted. No pneumothorax is noted. Right lung is clear. Mild left basilar atelectasis or infiltrate is noted with minimal associated pleural effusion. Bony thorax is unremarkable. IMPRESSION: Aortic atherosclerosis. Mild left basilar atelectasis or infiltrate is noted with minimal associated pleural effusion. Electronically Signed   By: Marijo Conception, M.D.   On: 05/13/2016 07:30   Dg Knee Left Port  Result Date: 05/12/2016 CLINICAL DATA:  Recent fall.  Pain . EXAM: PORTABLE LEFT KNEE - 1-2 VIEW COMPARISON:  No recent prior. FINDINGS: Mild tricompartment degenerative change. No acute bony or joint abnormality identified. No evidence of fracture or dislocation. IMPRESSION: Mild tricompartment degenerative change.  No acute abnormality Electronically Signed   By: Marcello Moores  Register   On: 05/12/2016 07:32   Ir Ileana Ladd Art  Tolu Guide Roadmapping  Result Date: 05/12/2016 INDICATION: 82 year old male with life-threatening lower GI hemorrhage. CT angiogram demonstrates hemorrhage within the distribution the right colic artery/middle colic artery near the hepatic flexure. EXAM: SELECTIVE VISCERAL ARTERIOGRAPHY; IR EMBO ART VEN HEMORR LYMPH EXTRAV INC GUIDE ROADMAPPING; IR ULTRASOUND GUIDANCE VASC ACCESS RIGHT; ADDITIONAL ARTERIOGRAPHY; ARTERIOGRAPHY MEDICATIONS: 100 mcg nitro, 4 mg IV Zofran. The antibiotic was administered within 1 hour of the procedure ANESTHESIA/SEDATION: Moderate (conscious) sedation was employed during this procedure. A total  of Versed 2.0 mg and Fentanyl 100 mcg was administered intravenously. Moderate Sedation Time: 90 minutes. The patient's level of consciousness and vital signs were monitored continuously by radiology nursing throughout the procedure under my direct supervision. CONTRAST:  160 cc Isovue FLUOROSCOPY TIME:  Fluoroscopy Time: 16 minutes 0 seconds (31.3 mGy). COMPLICATIONS: None PROCEDURE: Informed consent was obtained from the patient following explanation of the procedure, risks, benefits and alternatives. The patient understands, agrees and consents for the procedure. All questions were addressed. A time out was performed prior to the initiation of the procedure. Maximal barrier sterile technique utilized including caps, mask, sterile gowns, sterile gloves, large sterile drape, hand hygiene, and Betadine prep. Ultrasound survey of the right inguinal region was performed with images stored and sent to PACs. A micropuncture needle was used access the right  common femoral artery under ultrasound. With excellent arterial blood flow returned, and an .018 micro wire was passed through the needle, observed enter the abdominal aorta under fluoroscopy. The needle was removed, and a micropuncture sheath was placed over the wire. The inner dilator and wire were removed, and an 035 Bentson wire was advanced under fluoroscopy into the abdominal aorta. The sheath was removed and a standard 5 [place] vascular sheath was placed. The dilator was removed and the sheath was flushed. Standard 5 French C2 Cobra catheter was advanced over the [place] wire. Cobra catheter was used to select the superior mesenteric artery. Angiogram was performed. C2 Cobra was exchanged over the Bentson wire for [place] catheter. Micro catheter system was then used through the [place] catheter with 0.016 micro wire and 135cm Lantern micro catheter. Right/middle hepatic artery was then selected as the vasculature perfusing the area of interest. Sub  selective angiogram performed of branches of the right/ middle artery. Given the significant tortuosity of the vasculature and small caliber, micro catheter was not able to advance to a distal position. Angiogram demonstrated no evidence of active extravasation. 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 Single Gel-Foam pledget infused into target artery of interest, and then withdrawal of the catheter in repeat angiogram demonstrated small additional dysplastic branches at the hepatic flexure. A course Gel-Foam slurry was then infused with no significant filling at the completion. Micro catheter was flushed, withdrawn into the superior mesenteric artery, and then angiogram of the collateral vasculature to the GDA was performed. No perfusion of the areae of interest observed. Limited angiogram of the right common femoral artery performed. Exoseal was deployed for closure. Patient tolerated the procedure well and remained hemodynamically stable throughout. No complications were encountered and no significant blood loss encountered. FINDINGS: Ultrasound survey demonstrates mild atherosclerotic changes of the right common femoral artery which is widely patent. Superior mesenteric artery angiogram demonstrates patent superior mesenteric artery, patent ileal colic artery, patent arcades to the small bowel vasculature. 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 Splenic artery patent, left gastric artery patent, common hepatic artery patent, left hepatic artery and right hepatic artery patent. Large collateral vasculature contributed to the origin of gastroepiploic artery. Perfusion of the hepatic flexure was predominantly via right colic/middle colic branch. Sub selective angiogram of the colic branches demonstrated no active  extravasation or pseudoaneurysm. The knee shunting observed with angio dysplasia. Given the tortuosity and the inability to place the tip of the micro catheter within distal branches, no proximal coil was deployed. Instead, course Gel-Foam embolization of the territory was elected as treatment to decrease the pressure to the territory. Puncture of the right common femoral artery. 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 Tortuosity and small vasculature precluded a distal catheter position into the territory for deposition of metallic coils. Deployment of Exoseal for hemostasis. Signed, Dulcy Fanny. Earleen Newport, DO [facility] Radiology Electronically Signed   By: Corrie Mckusick D.O.   On: 05/12/2016 08:22   Ct Angio Abd/pel W And/or Wo Contrast  Result Date: 05/11/2016 CLINICAL DATA:  Acute onset of syncope and bright red rectal bleeding. Lightheadedness and generalized weakness. Initial encounter. EXAM: CTA ABDOMEN AND PELVIS wITHOUT AND WITH CONTRAST TECHNIQUE: Multidetector CT imaging of the abdomen  and pelvis was performed using the standard protocol during bolus administration of intravenous contrast. Multiplanar reconstructed images and MIPs were obtained and reviewed to evaluate the vascular anatomy. CONTRAST:  75 mL of Isovue 370 IV contrast COMPARISON:  None. FINDINGS: VASCULAR Aorta: Scattered calcification is seen along the abdominal aorta, without significant luminal narrowing. 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 SMA: The superior mesenteric artery appears patent, without evidence of luminal narrowing. Renals: Mild calcification is noted at the proximal renal arteries bilaterally, without significant luminal narrowing. IMA: The inferior  mesenteric artery remains patent. Inflow: Scattered calcification is seen along the common and internal iliac arteries. The external iliac arteries and common femoral arteries remain fully patent bilaterally, with mild calcification at the right common femoral artery. Proximal Outflow: Minimal mural thrombus is suggested at the proximal left superficial femoral artery. Minimal mural thrombus is suggested at the proximal right profunda femoris artery. Veins: Visualized venous structures are grossly unremarkable. The inferior vena cava is partially decompressed and unremarkable in appearance. Review of the MIP images confirms the above findings. NON-VASCULAR Lower chest: Mild bibasilar atelectasis or scarring is noted. Diffuse coronary artery calcifications are seen. The visualized portions of the mediastinum are otherwise unremarkable. Hepatobiliary: The liver is unremarkable in appearance. The gallbladder is unremarkable in appearance. The common bile duct remains normal in caliber. Pancreas: The pancreas is within normal limits. Spleen: The spleen is unremarkable in appearance. Adrenals/Urinary Tract: The adrenal glands are unremarkable in appearance. A large 7.5 cm cyst is noted at the upper pole of the left kidney. Mild nonspecific perinephric stranding is noted bilaterally. Additional scattered small bilateral renal cysts are seen. Mild left-sided renal pelvicaliectasis remains within normal limits, without evidence of significant hydronephrosis. No renal or ureteral stones are identified. Stomach/Bowel: The stomach is unremarkable in appearance. The small bowel is within normal limits. The appendix is not visualized; there is no evidence for appendicitis. Scattered diverticulosis is noted along the proximal sigmoid colon, without evidence of diverticulitis. There is a large amount of acute extravasation of contrast at the ascending colon, just proximal to the hepatic flexure of the colon, compatible with active  intraluminal hemorrhage. This explains the patient's bright red blood per rectum. Lymphatic: Retroperitoneal nodes are grossly unremarkable in appearance. No pelvic sidewall lymphadenopathy is appreciated. Reproductive: The bladder is mildly distended and grossly unremarkable. The prostate remains normal in size. Other: No additional soft tissue abnormalities are seen. Musculoskeletal: No acute osseous abnormalities are identified. Vacuum phenomenon is noted at L5-S1. Underlying facet disease is noted. The visualized musculature is unremarkable in appearance. IMPRESSION: VASCULAR 1. Large amount of acute extravasation of contrast at the ascending colon, just proximal to the hepatic flexure of the colon, compatible with active intraluminal hemorrhage. This explains the patient's bright red blood per rectum. 2. Scattered aortic atherosclerosis noted. 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 3. Diffuse coronary artery calcifications seen. NON-VASCULAR 1. Mild bibasilar atelectasis or scarring noted. 2. Scattered bilateral renal cysts, measuring up to 7.5 cm on the left. 3. Scattered diverticulosis along the proximal sigmoid colon, without evidence of diverticulitis. These results were called by telephone at the time of interpretation on 05/11/2016 at 9:58 pm to Dr. Carrie Mew, who verbally acknowledged these results. Electronically Signed   By: Garald Balding M.D.   On: 05/11/2016 22:04     Medications:  Scheduled: . famotidine (PEPCID) IV  10 mg Intravenous Q12H  . piperacillin-tazobactam (  ZOSYN)  IV  3.375 g Intravenous Q8H  . vancomycin  750 mg Intravenous Q48H   Continuous: . dextrose 5 % and 0.45% NaCl 100 mL/hr at 05/13/16 0748   PRN:  Assessment/Plan:  Active Problems:   Acute lower GI bleeding   Gastrointestinal hemorrhage   Acute blood loss anemia   Arterial hypotension   Acute pain of left knee   Slurred  speech    Lower GI bleed. Patient is status post embolization of the middle colic artery. This was done on 10/31 by interventional radiology. Patient hasn't had any further episodes of bleeding. Hemoglobin has been stable. Gastroenterology is following and plan is for a colonoscopy tomorrow.  Acute blood loss anemia secondary to GI bleed. Patient is status post transfusion of PRBC FFP and platelets. Counts are stable. Continue to monitor.  Leukocytosis with questionable infiltrate on chest x-ray Patient has had a significant rise in his WBC. No obvious focus of infection is identified except for an abnormality seen on chest x-ray. His lactic acid was noted to be elevated as his pro-calcitonin was also significantly elevated. Blood cultures have been obtained. UA did not show any evidence for UTI. He'll be given fluid boluses. No other parameters suggesting sepsis at this time. However, in view of significant leukocytosis and abnormal chest x-ray, will initiate vancomycin and Zosyn for now. Repeat lactic acid levels. Monitor WBC closely.  Questionable pneumonia, possible aspiration. See above. Continue antibiotics.  Hypotension. Secondary to acute hemorrhage. Blood pressures have stabilized. Continue to monitor.  Acute renal failure with non-anion gap metabolic acidosis Rise in BUN and creatinine haven't been noted. Could be due to hypovolemia. Patient also received contrast for his CT scan. Monitor urine upper closely. Increase rate of IV fluids. Repeat labs later today. Bicarbonate continues to decrease. He may need to be placed on bicarbonate infusion. No renal abnormalities noted on CT scan except for bilateral renal cysts.  Thrombocytopenia Continue to monitor platelet counts closely. Avoid heparin products.  Questionable slurred speech at the time of initial assessment. Thought to be his baseline. Patient does not have any neurological deficits. Continue to monitor.  Left knee  pain. X-ray did not show any acute process. Pain appears to have resolved.  DVT Prophylaxis: SCDs    Code Status: Full code  Family Communication: No family at bedside  Disposition Plan: Continue management as outlined above. Continue stepdown status.     LOS: 2 days   [facility] Pager [phone] 05/13/2016, 11:36 AM  If 7PM-7AM, please contact night-coverage at [email or web address], password [facility]

## 2016-05-13 NOTE — Progress Notes (Signed)
CRITICAL VALUE ALERT  Critical value received: lactic acid 3.0  Date of notification:  05/13/2016   Time of notification:  0738  Critical value read back:yes   Nurse who received alert:  Delrae Rend  MD notified (1st page):  Dr Bettey Costa on department  Time of first page:  md present on unit  MD notified (2nd page):  Time of second page:  Responding MD:   Time MD responded:  [id number]

## 2016-05-13 NOTE — Care Management Note (Signed)
Case Management Note  Patient Details  Name: Dustin Howell MRN: [id number] Date of Birth: 1925-04-14  Subjective/Objective:     Pt admitted with acute lower GI bleed               Action/Plan:  CM assessed pt - pt is from home alone and states he was independent prior to admit.  He has a daughter that will provide support at home if needed.  Pt states prior to admit that he did not require any dme nor HH.  CM will continue to follow for discharge needs   Expected Discharge Date:                  Expected Discharge Plan:     In-House Referral:     Discharge planning Services  CM Consult  Post Acute Care Choice:    Choice offered to:     DME Arranged:    DME Agency:     HH Arranged:    HH Agency:     Status of Service:  In process, will continue to follow  If discussed at Long Length of Stay Meetings, dates discussed:    Additional Comments:  Maryclare Labrador, RN 05/13/2016, 2:46 PM

## 2016-05-13 NOTE — Progress Notes (Signed)
Pharmacy Antibiotic Note  Dustin Howell is a 82 y.o. male admitted on 05/11/2016 with GIB and now suspected sepsis. Pharmacy has been consulted for vancomycin and zosyn dosing. WBC 10.3 > 49.6 and lactate elevated at 3. Poor renal function noted with SCr rising from 1.21 to 1.95 and CrCl ~ 20 mL/min.   Plan: Vancomycin 750 mg IV every 48 hours.  Goal trough 15-20 mcg/mL. Zosyn 3.375g IV q8h (4 hour infusion).  Monitor renal function, culture data, clinical picture, and vancomycin trough as needed.   Height: 5\' 7"  (170.2 cm) Weight: 121 lb 0.5 oz (54.9 kg) IBW/kg (Calculated) : 66.1  Temp (24hrs), Avg:98.2 F (36.8 C), Min:97.3 F (36.3 C), Max:98.8 F (37.1 C)   Recent Labs Lab 05/11/16 1741  05/12/16 0355 05/12/16 1057 05/12/16 1607 05/12/16 2238 05/13/16 0222 05/13/16 0616  WBC 16.5*  < > 15.9* 15.0* 23.4* 48.5* 49.6*  --   CREATININE 1.09  --  1.21  --   --   --  1.95*  --   LATICACIDVEN  --   --   --   --   --   --   --  3.0*  < > = values in this interval not displayed.  Estimated Creatinine Clearance: 19.9 mL/min (by C-G formula based on SCr of 1.95 mg/dL (H)).    No Known Allergies  Antimicrobials this admission: 11/1 Zosyn >>  11/1 Vanc >>   Dose adjustments this admission: n/a  Microbiology results: 11/1 BCx: pending  10/30 MRSA PCR: neg  Argie Ramming, PharmD Pharmacy Resident  Pager [phone] 05/13/16 8:29 AM

## 2016-05-13 NOTE — Progress Notes (Signed)
Pt transported via bed to 4E13 with Mindy, RN and NT. Pt belongings sent with pt.    Pt daughter updated on transfer.  Arnell Sieving, RN

## 2016-05-13 NOTE — Progress Notes (Signed)
[facility] Progress Note Patient Name: Dustin Howell DOB: [birth year] MRN: [id number]   Date of Service  05/13/2016  HPI/Events of Note  Patient was admitted with hypovolemic shock secondary to massive lower GI bleed on 10/30. He was transfused with packed red blood cells. Hemoglobin has stabilized.   WBC count slowly increasing  From 15 to 23 to 50 this am.  Plt dec at 69. Creatinine elevated at 1.95. HCO3 15.  (-) fever. BP and HR stable.   Patient was seen, comfortable although he states he feels "bad and sore all over".   A > AKI likely related to hypovolemia/shock Metabolic acidosis related to above R/O occult infection  eICU Interventions  Will panculture.  Start IVF.  Check lactate and PCT. Will hold off on abx pending results.  Check abg > if severe acidosis, may need to switch to NaHCO3 drip Keep in SDU status for now.      Intervention Category Major Interventions: Other:  [place] 05/13/2016, 5:56 AM

## 2016-05-14 ENCOUNTER — Inpatient Hospital Stay (HOSPITAL_COMMUNITY): Payer: Medicare Other

## 2016-05-14 ENCOUNTER — Encounter (HOSPITAL_COMMUNITY): Payer: Self-pay | Admitting: Certified Registered Nurse Anesthetist

## 2016-05-14 DIAGNOSIS — D696 Thrombocytopenia, unspecified: Secondary | ICD-10-CM

## 2016-05-14 DIAGNOSIS — N179 Acute kidney failure, unspecified: Secondary | ICD-10-CM

## 2016-05-14 DIAGNOSIS — K567 Ileus, unspecified: Secondary | ICD-10-CM

## 2016-05-14 LAB — CBC
HCT: 28.6 % — ABNORMAL LOW (ref 39.0–52.0)
HEMATOCRIT: 31.4 % — AB (ref 39.0–52.0)
HEMOGLOBIN: 9.9 g/dL — AB (ref 13.0–17.0)
HEMOGLOBIN: 10.7 g/dL — AB (ref 13.0–17.0)
MCH: 27.9 pg (ref 26.0–34.0)
MCH: 27.5 pg (ref 26.0–34.0)
MCHC: 34.6 g/dL (ref 30.0–36.0)
MCHC: 34.1 g/dL (ref 30.0–36.0)
MCV: 80.6 fL (ref 78.0–100.0)
MCV: 80.7 fL (ref 78.0–100.0)
Platelets: 61 10*3/uL — ABNORMAL LOW (ref 150–400)
Platelets: 71 10*3/uL — ABNORMAL LOW (ref 150–400)
RBC: 3.55 MIL/uL — AB (ref 4.22–5.81)
RBC: 3.89 MIL/uL — ABNORMAL LOW (ref 4.22–5.81)
RDW: 17.7 % — ABNORMAL HIGH (ref 11.5–15.5)
RDW: 18.5 % — AB (ref 11.5–15.5)
WBC: 22.7 10*3/uL — ABNORMAL HIGH (ref 4.0–10.5)
WBC: 24.1 10*3/uL — ABNORMAL HIGH (ref 4.0–10.5)

## 2016-05-14 LAB — PROCALCITONIN: PROCALCITONIN: 2.73 ng/mL

## 2016-05-14 LAB — TYPE AND SCREEN
ABO/RH(D): O POS
Antibody Screen: NEGATIVE
UNIT DIVISION: 0
Unit division: 0

## 2016-05-14 LAB — COMPREHENSIVE METABOLIC PANEL
ALBUMIN: 2.7 g/dL — AB (ref 3.5–5.0)
ALK PHOS: 56 U/L (ref 38–126)
ALT: 23 U/L (ref 17–63)
ANION GAP: 8 (ref 5–15)
AST: 66 U/L — ABNORMAL HIGH (ref 15–41)
BUN: 35 mg/dL — ABNORMAL HIGH (ref 6–20)
CALCIUM: 7.7 mg/dL — AB (ref 8.9–10.3)
CHLORIDE: 113 mmol/L — AB (ref 101–111)
CO2: 17 mmol/L — AB (ref 22–32)
CREATININE: 1.39 mg/dL — AB (ref 0.61–1.24)
GFR calc non Af Amer: 43 mL/min — ABNORMAL LOW (ref >60)
GFR, EST AFRICAN AMERICAN: 50 mL/min — AB (ref >60)
GLUCOSE: 123 mg/dL — AB (ref 65–99)
Potassium: 3.7 mmol/L (ref 3.5–5.1)
SODIUM: 138 mmol/L (ref 135–145)
Total Bilirubin: 1.1 mg/dL (ref 0.3–1.2)
Total Protein: 5 g/dL — ABNORMAL LOW (ref 6.5–8.1)

## 2016-05-14 LAB — LACTIC ACID, PLASMA: Lactic Acid, Venous: 1.7 mmol/L (ref 0.5–1.9)

## 2016-05-14 MED ORDER — PEG-KCL-NACL-NASULF-NA ASC-C 100 G PO SOLR
0.5000 | Freq: Once | ORAL | Status: DC
  Filled 2016-05-14: qty 1

## 2016-05-14 MED ORDER — PEG-KCL-NACL-NASULF-NA ASC-C 100 G PO SOLR
0.5000 | Freq: Once | ORAL | Status: AC
  Administered 2016-05-14: 100 g via ORAL
  Filled 2016-05-14: qty 1

## 2016-05-14 MED ORDER — HYDRALAZINE HCL 20 MG/ML IJ SOLN
10.0000 mg | Freq: Once | INTRAMUSCULAR | Status: AC
  Administered 2016-05-14: 10 mg via INTRAVENOUS
  Filled 2016-05-14: qty 1

## 2016-05-14 MED ORDER — LORAZEPAM 2 MG/ML IJ SOLN: 0.5000 mg | Freq: Once | INTRAMUSCULAR | Status: DC

## 2016-05-14 MED ORDER — METOCLOPRAMIDE HCL 5 MG/ML IJ SOLN: 10.0000 mg | Freq: Once | INTRAMUSCULAR | Status: DC

## 2016-05-14 MED ORDER — PEG-KCL-NACL-NASULF-NA ASC-C 100 G PO SOLR: 1.0000 | Freq: Once | ORAL | Status: DC

## 2016-05-14 MED ORDER — PEG-KCL-NACL-NASULF-NA ASC-C 100 G PO SOLR
0.5000 | Freq: Once | ORAL | Status: DC
Start: 2016-05-15 — End: 2016-05-15

## 2016-05-14 MED ORDER — METOCLOPRAMIDE HCL 5 MG/ML IJ SOLN
10.0000 mg | Freq: Once | INTRAMUSCULAR | Status: AC
  Administered 2016-05-14: 10 mg via INTRAVENOUS
  Filled 2016-05-14: qty 2

## 2016-05-14 MED ORDER — PEG-KCL-NACL-NASULF-NA ASC-C 100 G PO SOLR: 0.5000 | Freq: Once | ORAL | Status: DC

## 2016-05-14 MED ORDER — METOCLOPRAMIDE HCL 5 MG/ML IJ SOLN
10.0000 mg | Freq: Once | INTRAMUSCULAR | Status: AC
  Administered 2016-05-15: 10 mg via INTRAVENOUS
  Filled 2016-05-14: qty 2

## 2016-05-14 MED ORDER — METOPROLOL TARTRATE 5 MG/5ML IV SOLN
2.5000 mg | Freq: Four times a day (QID) | INTRAVENOUS | Status: DC
Start: 2016-05-14 — End: 2016-05-17
  Administered 2016-05-14 – 2016-05-17 (×13): 2.5 mg via INTRAVENOUS
  Filled 2016-05-14 (×13): qty 5

## 2016-05-14 NOTE — Progress Notes (Signed)
Pt arrived to thge unit from 84M accompanied by SWOT RN and an NT.  Pt is alert and oriented x 1 (to person). Vital signs are stable and the pt denies pain.  1 unit PRBC's currently running.  Of note the pts belongings were brought from the unit although his hearing aid case only contains 1 hearing aid which appears to be nonfunctioning due to a dead battery. Will continue to monitor.

## 2016-05-14 NOTE — Progress Notes (Signed)
MD ordered 10 mg IV hydralazine for BP elevation in prior note. Med given. Will continue to monitor.

## 2016-05-14 NOTE — Progress Notes (Signed)
Referring Physician(s): Dr. Governor Rooks  Supervising Physician: Dr. Pascal Lux  Patient Status:  [facility] - In-pt  Chief Complaint:  Lower GI bleed S/p embolization  Subjective:  Feeling ok, drank prep but really hasn't had much BM, feels bloated No bloody BMs  Allergies: Review of patient's allergies indicates no known allergies.  Medications:  Current Facility-Administered Medications:  .  0.9 %  sodium chloride infusion, , Intravenous, Once, Bonnielee Haff, MD .  alum & mag hydroxide-simeth (MAALOX/MYLANTA) 200-200-20 MG/5ML suspension 15 mL, 15 mL, Oral, Q4H PRN, Annita Brod, MD, 15 mL at 05/13/16 1333 .  dextrose 5 %-0.45 % sodium chloride infusion, , Intravenous, Continuous, Bonnielee Haff, MD, Last Rate: 100 mL/hr at 05/14/16 0600 .  famotidine (PEPCID) 10 mg in sodium chloride 0.9 % 25 mL, 10 mg, Intravenous, Q12H, Jose Angelo A de Belk, MD, 10 mg at 05/13/16 2350 .  LORazepam (ATIVAN) injection 0.5 mg, 0.5 mg, Intravenous, Once, Jeryl Columbia, NP .  metoCLOPramide (REGLAN) injection 10 mg, 10 mg, Intravenous, Once **FOLLOWED BY** [START ON 05/15/2016] metoCLOPramide (REGLAN) injection 10 mg, 10 mg, Intravenous, Once, Vena Rua, PA-C .  metoprolol (LOPRESSOR) injection 2.5 mg, 2.5 mg, Intravenous, Q6H, Bonnielee Haff, MD, 2.5 mg at 05/14/16 0931 .  peg 3350 powder (MOVIPREP) kit 100 g, 0.5 kit, Oral, Once **AND** [START ON 05/15/2016] peg 3350 powder (MOVIPREP) kit 100 g, 0.5 kit, Oral, Once, Bonnielee Haff, MD .  piperacillin-tazobactam (ZOSYN) IVPB 3.375 g, 3.375 g, Intravenous, Q8H, Honor Loh, RPH, 3.375 g at 05/14/16 0600 .  vancomycin (VANCOCIN) IVPB 750 mg/150 ml premix, 750 mg, Intravenous, Q48H, Honor Loh, RPH, 750 mg at 05/13/16 1102    Vital Signs: BP (!) 156/88 (BP Location: Right Arm)   Pulse (!) 116   Temp 98.4 F (36.9 C) (Oral)   Resp (!) 28   Ht 5' 7"  (1.702 m)   Wt 128 lb 15.5 oz (58.5 kg)   SpO2 95%   BMI 20.20 kg/m    Physical Exam  Abdominal: He exhibits distension. There is no tenderness. There is no guarding.  Skin:  (R)femoral puncture Site c/d/i    Imaging: Ir Angiogram Visceral Selective  Result Date: 05/12/2016 INDICATION: 82 year old male with life-threatening lower GI hemorrhage. CT angiogram demonstrates hemorrhage within the distribution the right colic artery/middle colic artery near the hepatic flexure. EXAM: SELECTIVE VISCERAL ARTERIOGRAPHY; IR EMBO ART VEN HEMORR LYMPH EXTRAV INC GUIDE ROADMAPPING; IR ULTRASOUND GUIDANCE VASC ACCESS RIGHT; ADDITIONAL ARTERIOGRAPHY; ARTERIOGRAPHY MEDICATIONS: 100 mcg nitro, 4 mg IV Zofran. The antibiotic was administered within 1 hour of the procedure ANESTHESIA/SEDATION: Moderate (conscious) sedation was employed during this procedure. A total of Versed 2.0 mg and Fentanyl 100 mcg was administered intravenously. Moderate Sedation Time: 90 minutes. The patient's level of consciousness and vital signs were monitored continuously by radiology nursing throughout the procedure under my direct supervision. CONTRAST:  160 cc Isovue FLUOROSCOPY TIME:  Fluoroscopy Time: 16 minutes 0 seconds (31.3 mGy). COMPLICATIONS: None PROCEDURE: Informed consent was obtained from the patient following explanation of the procedure, risks, benefits and alternatives. The patient understands, agrees and consents for the procedure. All questions were addressed. A time out was performed prior to the initiation of the procedure. Maximal barrier sterile technique utilized including caps, mask, sterile gowns, sterile gloves, large sterile drape, hand hygiene, and Betadine prep. Ultrasound survey of the right inguinal region was performed with images stored and sent to PACs. A micropuncture needle was used access the right common  femoral artery under ultrasound. With excellent arterial blood flow returned, and an .018 micro wire was passed through the needle, observed enter the abdominal aorta under  fluoroscopy. The needle was removed, and a micropuncture sheath was placed over the wire. The inner dilator and wire were removed, and an 035 Bentson wire was advanced under fluoroscopy into the abdominal aorta. The sheath was removed and a standard 5 [place] vascular sheath was placed. The dilator was removed and the sheath was flushed. Standard 5 French C2 Cobra catheter was advanced over the [place] wire. Cobra catheter was used to select the superior mesenteric artery. Angiogram was performed. C2 Cobra was exchanged over the Bentson wire for [place] catheter. Micro catheter system was then used through the [place] catheter with 0.016 micro wire and 135cm Lantern micro catheter. Right/middle hepatic artery was then selected as the vasculature perfusing the area of interest. Sub selective angiogram performed of branches of the right/ middle artery. Given the significant tortuosity of the vasculature and small caliber, micro catheter was not able to advance to a distal position. Angiogram demonstrated no evidence of active extravasation. 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 Single Gel-Foam pledget infused into target artery of interest, and then withdrawal of the catheter in repeat angiogram demonstrated small additional dysplastic branches at the hepatic flexure. A course Gel-Foam slurry was then infused with no significant filling at the completion. Micro catheter was flushed, withdrawn into the superior mesenteric artery, and then angiogram of the collateral vasculature to the GDA was performed. No perfusion of the areae of interest observed. Limited angiogram of the right common femoral artery performed. Exoseal was deployed for closure. Patient tolerated the procedure well and remained hemodynamically stable throughout. No complications were encountered and no significant blood loss encountered. FINDINGS: Ultrasound survey demonstrates mild atherosclerotic changes of the right common femoral  artery which is widely patent. Superior mesenteric artery angiogram demonstrates patent superior mesenteric artery, patent ileal colic artery, patent arcades to the small bowel vasculature. 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 Splenic artery patent, left gastric artery patent, common hepatic artery patent, left hepatic artery and right hepatic artery patent. Large collateral vasculature contributed to the origin of gastroepiploic artery. Perfusion of the hepatic flexure was predominantly via right colic/middle colic branch. Sub selective angiogram of the colic branches demonstrated no active extravasation or pseudoaneurysm. The knee shunting observed with angio dysplasia. Given the tortuosity and the inability to place the tip of the micro catheter within distal branches, no proximal coil was deployed. Instead, course Gel-Foam embolization of the territory was elected as treatment to decrease the pressure to the territory. Puncture of the right common femoral artery. 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 Tortuosity and small vasculature precluded a distal catheter position into the territory for deposition of metallic coils. Deployment of Exoseal for hemostasis. Signed, Dulcy Fanny. Earleen Newport, DO [facility] Radiology Electronically Signed   By: Corrie Mckusick D.O.   On: 05/12/2016 08:22   Ir Angiogram Selective Each Additional Vessel  Result Date: 05/12/2016 INDICATION: 82 year old male with life-threatening lower GI hemorrhage. CT angiogram demonstrates hemorrhage within the distribution the right colic artery/middle colic artery near the  hepatic flexure. EXAM: SELECTIVE VISCERAL ARTERIOGRAPHY; IR EMBO ART VEN HEMORR LYMPH  EXTRAV INC GUIDE ROADMAPPING; IR ULTRASOUND GUIDANCE VASC ACCESS RIGHT; ADDITIONAL ARTERIOGRAPHY; ARTERIOGRAPHY MEDICATIONS: 100 mcg nitro, 4 mg IV Zofran. The antibiotic was administered within 1 hour of the procedure ANESTHESIA/SEDATION: Moderate (conscious) sedation was employed during this procedure. A total of Versed 2.0 mg and Fentanyl 100 mcg was administered intravenously. Moderate Sedation Time: 90 minutes. The patient's level of consciousness and vital signs were monitored continuously by radiology nursing throughout the procedure under my direct supervision. CONTRAST:  160 cc Isovue FLUOROSCOPY TIME:  Fluoroscopy Time: 16 minutes 0 seconds (31.3 mGy). COMPLICATIONS: None PROCEDURE: Informed consent was obtained from the patient following explanation of the procedure, risks, benefits and alternatives. The patient understands, agrees and consents for the procedure. All questions were addressed. A time out was performed prior to the initiation of the procedure. Maximal barrier sterile technique utilized including caps, mask, sterile gowns, sterile gloves, large sterile drape, hand hygiene, and Betadine prep. Ultrasound survey of the right inguinal region was performed with images stored and sent to PACs. A micropuncture needle was used access the right common femoral artery under ultrasound. With excellent arterial blood flow returned, and an .018 micro wire was passed through the needle, observed enter the abdominal aorta under fluoroscopy. The needle was removed, and a micropuncture sheath was placed over the wire. The inner dilator and wire were removed, and an 035 Bentson wire was advanced under fluoroscopy into the abdominal aorta. The sheath was removed and a standard 5 [place] vascular sheath was placed. The dilator was removed and the sheath was flushed. Standard 5 French C2 Cobra catheter was advanced over the  [place] wire. Cobra catheter was used to select the superior mesenteric artery. Angiogram was performed. C2 Cobra was exchanged over the [place] wire for [place] catheter. Micro catheter system was then used through the [place] catheter with 0.016 micro wire and 135cm Lan[place] micro catheter. Right/middle hepatic artery was then selected as the vasculature perfusing the area of interest. Sub selective angiogram performed of branches of the right/ middle artery. Given the significant tortuosity of the vasculature and small caliber, micro catheter was not able to advance to a distal position. Angiogram demonstrated no evidence of active extravasation. 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 Single Gel-Foam pledget infused into target artery of interest, and then withdrawal of the catheter in repeat angiogram demonstrated small additional dysplastic branches at the hepatic flexure. A course Gel-Foam slurry was then infused with no significant filling at the completion. Micro catheter was flushed, withdrawn into the superior mesenteric artery, and then angiogram of the collateral vasculature to the GDA was performed. No perfusion of the areae of interest observed. Limited angiogram of the right common femoral artery performed. Exoseal was deployed for closure. Patient tolerated the procedure well and remained hemodynamically stable throughout. No complications were encountered and no significant blood loss encountered. FINDINGS: Ultrasound survey demonstrates mild atherosclerotic changes of the right common femoral artery which is widely patent. Superior mesenteric artery angiogram demonstrates patent superior mesenteric artery, patent ileal colic artery, patent arcades to the small bowel vasculature. There is engorged tortuous collateral flow to the gastroduodenal artery with retrograde filling of the celiac artery origin, compatible with celiac artery origin stenosis. Post stenotic dilation of the  proximal celiac artery. Splenic artery patent, left gastric artery patent, common hepatic artery patent, left hepatic artery and right hepatic artery patent. Large collateral vasculature contributed to the origin of gastroepiploic artery. Perfusion of the hepatic flexure was predominantly via right colic/middle colic branch. Sub selective  angiogram of the colic branches demonstrated no active extravasation or pseudoaneurysm. The knee shunting observed with angio dysplasia. Given the tortuosity and the inability to place the tip of the micro catheter within distal branches, no proximal coil was deployed. Instead, course Gel-Foam embolization of the territory was elected as treatment to decrease the pressure to the territory. Puncture of the right common femoral artery. 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 Tortuosity and small vasculature precluded a distal catheter position into the territory for deposition of metallic coils. Deployment of Exoseal for hemostasis. Signed, Dulcy Fanny. Earleen Newport, DO [facility] Radiology Electronically Signed   By: Corrie Mckusick D.O.   On: 05/12/2016 08:22   Ir Angiogram Selective Each Additional Vessel  Result Date: 05/12/2016 INDICATION: 82 year old male with life-threatening lower GI hemorrhage. CT angiogram demonstrates hemorrhage within the distribution the right colic artery/middle colic artery near the hepatic flexure. EXAM: SELECTIVE VISCERAL ARTERIOGRAPHY; IR EMBO ART VEN HEMORR LYMPH EXTRAV INC GUIDE ROADMAPPING; IR ULTRASOUND GUIDANCE VASC ACCESS RIGHT; ADDITIONAL ARTERIOGRAPHY; ARTERIOGRAPHY MEDICATIONS: 100 mcg nitro, 4 mg IV Zofran. The antibiotic was administered within 1 hour of the procedure ANESTHESIA/SEDATION: Moderate (conscious) sedation was employed during this procedure. A  total of Versed 2.0 mg and Fentanyl 100 mcg was administered intravenously. Moderate Sedation Time: 90 minutes. The patient's level of consciousness and vital signs were monitored continuously by radiology nursing throughout the procedure under my direct supervision. CONTRAST:  160 cc Isovue FLUOROSCOPY TIME:  Fluoroscopy Time: 16 minutes 0 seconds (31.3 mGy). COMPLICATIONS: None PROCEDURE: Informed consent was obtained from the patient following explanation of the procedure, risks, benefits and alternatives. The patient understands, agrees and consents for the procedure. All questions were addressed. A time out was performed prior to the initiation of the procedure. Maximal barrier sterile technique utilized including caps, mask, sterile gowns, sterile gloves, large sterile drape, hand hygiene, and Betadine prep. Ultrasound survey of the right inguinal region was performed with images stored and sent to PACs. A micropuncture needle was used access the right common femoral artery under ultrasound. With excellent arterial blood flow returned, and an .018 micro wire was passed through the needle, observed enter the abdominal aorta under fluoroscopy. The needle was removed, and a micropuncture sheath was placed over the wire. The inner dilator and wire were removed, and an 035 Bentson wire was advanced under fluoroscopy into the abdominal aorta. The sheath was removed and a standard 5 [place] vascular sheath was placed. The dilator was removed and the sheath was flushed. Standard 5 French C2 Cobra catheter was advanced over the [place] wire. Cobra catheter was used to select the superior mesenteric artery. Angiogram was performed. C2 Cobra was exchanged over the Bentson wire for [place] catheter. Micro catheter system was then used through the [place] catheter with 0.016 micro wire and 135cm Lantern micro catheter. Right/middle hepatic artery was then selected as the vasculature perfusing the area of interest. Sub  selective angiogram performed of branches of the right/ middle artery. Given the significant tortuosity of the vasculature and small caliber, micro catheter was not able to advance to a distal position. Angiogram demonstrated no evidence of active extravasation. 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 Single Gel-Foam pledget infused into target artery of interest, and then withdrawal of the catheter in repeat angiogram demonstrated small additional dysplastic branches at the hepatic flexure. A course Gel-Foam slurry was then infused  with no significant filling at the completion. Micro catheter was flushed, withdrawn into the superior mesenteric artery, and then angiogram of the collateral vasculature to the GDA was performed. No perfusion of the areae of interest observed. Limited angiogram of the right common femoral artery performed. Exoseal was deployed for closure. Patient tolerated the procedure well and remained hemodynamically stable throughout. No complications were encountered and no significant blood loss encountered. FINDINGS: Ultrasound survey demonstrates mild atherosclerotic changes of the right common femoral artery which is widely patent. Superior mesenteric artery angiogram demonstrates patent superior mesenteric artery, patent ileal colic artery, patent arcades to the small bowel vasculature. 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 Splenic artery patent, left gastric artery patent, common hepatic artery patent, left hepatic artery and right hepatic artery patent. Large collateral vasculature contributed to the origin of gastroepiploic artery. Perfusion of the hepatic flexure was predominantly via right colic/middle colic branch. Sub selective angiogram of the colic branches demonstrated no active  extravasation or pseudoaneurysm. The knee shunting observed with angio dysplasia. Given the tortuosity and the inability to place the tip of the micro catheter within distal branches, no proximal coil was deployed. Instead, course Gel-Foam embolization of the territory was elected as treatment to decrease the pressure to the territory. Puncture of the right common femoral artery. 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 Tortuosity and small vasculature precluded a distal catheter position into the territory for deposition of metallic coils. Deployment of Exoseal for hemostasis. Signed, Dulcy Fanny. Earleen Newport, DO [facility] Radiology Electronically Signed   By: Corrie Mckusick D.O.   On: 05/12/2016 08:22   Ir Angiogram Selective Each Additional Vessel  Result Date: 05/12/2016 INDICATION: 82 year old male with life-threatening lower GI hemorrhage. CT angiogram demonstrates hemorrhage within the distribution the right colic artery/middle colic artery near the hepatic flexure. EXAM: SELECTIVE VISCERAL ARTERIOGRAPHY; IR EMBO ART VEN HEMORR LYMPH EXTRAV INC GUIDE ROADMAPPING; IR ULTRASOUND GUIDANCE VASC ACCESS RIGHT; ADDITIONAL ARTERIOGRAPHY; ARTERIOGRAPHY MEDICATIONS: 100 mcg nitro, 4 mg IV Zofran. The antibiotic was administered within 1 hour of the procedure ANESTHESIA/SEDATION: Moderate (conscious) sedation was employed during this procedure. A total of Versed 2.0 mg and Fentanyl 100 mcg was administered intravenously. Moderate Sedation Time: 90 minutes. The patient's level of consciousness and vital signs were monitored continuously by radiology nursing throughout the procedure under my direct supervision. CONTRAST:  160 cc Isovue FLUOROSCOPY TIME:  Fluoroscopy Time: 16 minutes 0 seconds (31.3 mGy). COMPLICATIONS: None PROCEDURE:  Informed consent was obtained from the patient following explanation of the procedure, risks, benefits and alternatives. The patient understands, agrees and consents for the procedure. All questions were addressed. A time out was performed prior to the initiation of the procedure. Maximal barrier sterile technique utilized including caps, mask, sterile gowns, sterile gloves, large sterile drape, hand hygiene, and Betadine prep. Ultrasound survey of the right inguinal region was performed with images stored and sent to PACs. A micropuncture needle was used access the right common femoral artery under ultrasound. With excellent arterial blood flow returned, and an .018 micro wire was passed through the needle, observed enter the abdominal aorta under fluoroscopy. The needle was removed, and a micropuncture sheath was placed over the wire. The inner dilator and wire were removed, and  an 38 Bentson wire was advanced under fluoroscopy into the abdominal aorta. The sheath was removed and a standard 5 [place] vascular sheath was placed. The dilator was removed and the sheath was flushed. Standard 5 French C2 Cobra catheter was advanced over the [place] wire. Cobra catheter was used to select the superior mesenteric artery. Angiogram was performed. C2 Cobra was exchanged over the Bentson wire for [place] catheter. Micro catheter system was then used through the [place] catheter with 0.016 micro wire and 135cm [place] micro catheter. Right/middle hepatic artery was then selected as the vasculature perfusing the area of interest. Sub selective angiogram performed of branches of the right/ middle artery. Given the significant tortuosity of the vasculature and small caliber, micro catheter was not able to advance to a distal position. Angiogram demonstrated no evidence of active extravasation. 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 Single Gel-Foam pledget infused into target artery of interest, and then  withdrawal of the catheter in repeat angiogram demonstrated small additional dysplastic branches at the hepatic flexure. A course Gel-Foam slurry was then infused with no significant filling at the completion. Micro catheter was flushed, withdrawn into the superior mesenteric artery, and then angiogram of the collateral vasculature to the GDA was performed. No perfusion of the areae of interest observed. Limited angiogram of the right common femoral artery performed. Exoseal was deployed for closure. Patient tolerated the procedure well and remained hemodynamically stable throughout. No complications were encountered and no significant blood loss encountered. FINDINGS: Ultrasound survey demonstrates mild atherosclerotic changes of the right common femoral artery which is widely patent. Superior mesenteric artery angiogram demonstrates patent superior mesenteric artery, patent ileal colic artery, patent arcades to the small bowel vasculature. 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 Splenic artery patent, left gastric artery patent, common hepatic artery patent, left hepatic artery and right hepatic artery patent. Large collateral vasculature contributed to the origin of gastroepiploic artery. Perfusion of the hepatic flexure was predominantly via right colic/middle colic branch. Sub selective angiogram of the colic branches demonstrated no active extravasation or pseudoaneurysm. The knee shunting observed with angio dysplasia. Given the tortuosity and the inability to place the tip of the micro catheter within distal branches, no proximal coil was deployed. Instead, course Gel-Foam embolization of the territory was elected as treatment to decrease the pressure to the territory. Puncture of the right common femoral artery. 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 Tortuosity and small vasculature precluded a distal catheter position into the territory for deposition of metallic coils. Deployment of Exoseal for hemostasis. Signed, Dulcy Fanny. Earleen Newport, DO [facility] Radiology Electronically Signed   By: Corrie Mckusick D.O.   On: 05/12/2016 08:22   Ir Angiogram Follow Up Study  Result Date: 05/12/2016 INDICATION: 82 year old male with life-threatening lower GI hemorrhage. CT angiogram demonstrates hemorrhage within the distribution the right colic artery/middle colic artery near the hepatic flexure. EXAM: SELECTIVE VISCERAL ARTERIOGRAPHY; IR EMBO ART VEN HEMORR LYMPH EXTRAV INC GUIDE ROADMAPPING; IR ULTRASOUND GUIDANCE VASC ACCESS RIGHT; ADDITIONAL ARTERIOGRAPHY; ARTERIOGRAPHY MEDICATIONS: 100 mcg nitro, 4 mg IV Zofran. The antibiotic was administered within 1 hour of the procedure ANESTHESIA/SEDATION: Moderate (conscious) sedation was employed during this procedure. A total of Versed 2.0 mg and Fentanyl 100 mcg  was administered intravenously. Moderate Sedation Time: 90 minutes. The patient's level of consciousness and vital signs were monitored continuously by radiology nursing throughout the procedure under my direct supervision. CONTRAST:  160 cc Isovue FLUOROSCOPY TIME:  Fluoroscopy Time: 16 minutes 0 seconds (31.3 mGy). COMPLICATIONS: None PROCEDURE: Informed consent was obtained from the patient following explanation of the procedure, risks, benefits and alternatives. The patient understands, agrees and consents for the procedure. All questions were addressed. A time out was performed prior to the initiation of the procedure. Maximal barrier sterile technique utilized including caps, mask, sterile gowns, sterile gloves, large sterile drape, hand hygiene,  and Betadine prep. Ultrasound survey of the right inguinal region was performed with images stored and sent to PACs. A micropuncture needle was used access the right common femoral artery under ultrasound. With excellent arterial blood flow returned, and an .018 micro wire was passed through the needle, observed enter the abdominal aorta under fluoroscopy. The needle was removed, and a micropuncture sheath was placed over the wire. The inner dilator and wire were removed, and an 035 Bentson wire was advanced under fluoroscopy into the abdominal aorta. The sheath was removed and a standard 5 [place] vascular sheath was placed. The dilator was removed and the sheath was flushed. Standard 5 French C2 Cobra catheter was advanced over the [place] wire. Cobra catheter was used to select the superior mesenteric artery. Angiogram was performed. C2 Cobra was exchanged over the [place] wire for [place] catheter. Micro catheter system was then used through the [place] catheter with 0.016 micro wire and 135cm [place] micro catheter. Right/middle hepatic artery was then selected as the vasculature perfusing the area of interest. Sub selective angiogram performed of branches of the right/ middle artery. Given the significant tortuosity of the vasculature and small caliber, micro catheter was not able to advance to a distal position. Angiogram demonstrated no evidence of active extravasation. 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 Single Gel-Foam pledget infused into target artery of interest, and then withdrawal of the catheter in repeat angiogram demonstrated small additional dysplastic branches at the hepatic flexure. A course Gel-Foam slurry was then infused with no significant filling at the completion. Micro catheter was flushed, withdrawn into the superior mesenteric artery, and then angiogram of the collateral vasculature to the GDA was performed. No perfusion of the areae of interest observed. Limited  angiogram of the right common femoral artery performed. Exoseal was deployed for closure. Patient tolerated the procedure well and remained hemodynamically stable throughout. No complications were encountered and no significant blood loss encountered. FINDINGS: Ultrasound survey demonstrates mild atherosclerotic changes of the right common femoral artery which is widely patent. Superior mesenteric artery angiogram demonstrates patent superior mesenteric artery, patent ileal colic artery, patent arcades to the small bowel vasculature. 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 Splenic artery patent, left gastric artery patent, common hepatic artery patent, left hepatic artery and right hepatic artery patent. Large collateral vasculature contributed to the origin of gastroepiploic artery. Perfusion of the hepatic flexure was predominantly via right colic/middle colic branch. Sub selective angiogram of the colic branches demonstrated no active extravasation or pseudoaneurysm. The knee shunting observed with angio dysplasia. Given the tortuosity and the inability to place the tip of the micro catheter within distal branches, no proximal coil was deployed. Instead, course Gel-Foam embolization of the territory was elected as  treatment to decrease the pressure to the territory. Puncture of the right common femoral artery. 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 Tortuosity and small vasculature precluded a distal catheter position into the territory for deposition of metallic coils. Deployment of Exoseal for hemostasis. Signed, Dulcy Fanny. Earleen Newport, DO [facility]  [facility] Radiology Electronically Signed   By: Corrie Mckusick D.O.   On: 05/12/2016 08:22   Ir US Guide Vasc Access Right  Result Date: 05/12/2016 INDICATION: 82 year old male with life-threatening lower GI hemorrhage. CT angiogram demonstrates hemorrhage within the distribution the right colic artery/middle colic artery near the hepatic flexure. EXAM: SELECTIVE VISCERAL ARTERIOGRAPHY; IR EMBO ART VEN HEMORR LYMPH EXTRAV INC GUIDE ROADMAPPING; IR ULTRASOUND GUIDANCE VASC ACCESS RIGHT; ADDITIONAL ARTERIOGRAPHY; ARTERIOGRAPHY MEDICATIONS: 100 mcg nitro, 4 mg IV Zofran. The antibiotic was administered within 1 hour of the procedure ANESTHESIA/SEDATION: Moderate (conscious) sedation was employed during this procedure. A total of Versed 2.0 mg and Fentanyl 100 mcg was administered intravenously. Moderate Sedation Time: 90 minutes. The patient's level of consciousness and vital signs were monitored continuously by radiology nursing throughout the procedure under my direct supervision. CONTRAST:  160 cc Isovue FLUOROSCOPY TIME:  Fluoroscopy Time: 16 minutes 0 seconds (31.3 mGy). COMPLICATIONS: None PROCEDURE: Informed consent was obtained from the patient following explanation of the procedure, risks, benefits and alternatives. The patient understands, agrees and consents for the procedure. All questions were addressed. A time out was performed prior to the initiation of the procedure. Maximal barrier sterile technique utilized including caps, mask, sterile gowns, sterile gloves, large sterile drape, hand hygiene, and Betadine prep. Ultrasound survey of the right inguinal region was performed with images stored and sent to PACs. A micropuncture needle was used access the right common femoral artery under ultrasound. With excellent arterial blood flow returned, and an .018 micro wire was passed through the needle, observed enter the abdominal aorta under fluoroscopy. The needle was removed, and a micropuncture sheath  was placed over the wire. The inner dilator and wire were removed, and an 035 Bentson wire was advanced under fluoroscopy into the abdominal aorta. The sheath was removed and a standard 5 [place] vascular sheath was placed. The dilator was removed and the sheath was flushed. Standard 5 French C2 Cobra catheter was advanced over the [place] wire. Cobra catheter was used to select the superior mesenteric artery. Angiogram was performed. C2 Cobra was exchanged over the [place] wire for [place] catheter. Micro catheter system was then used through the [place] catheter with 0.016 micro wire and 135cm [place] micro catheter. Right/middle hepatic artery was then selected as the vasculature perfusing the area of interest. Sub selective angiogram performed of branches of the right/ middle artery. Given the significant tortuosity of the vasculature and small caliber, micro catheter was not able to advance to a distal position. Angiogram demonstrated no evidence of active extravasation. 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 Single Gel-Foam pledget infused into target artery of interest, and then withdrawal of the catheter in repeat angiogram demonstrated small additional dysplastic branches at the hepatic flexure. A course Gel-Foam slurry was then infused with no significant filling at the completion. Micro catheter was flushed, withdrawn into the superior mesenteric artery, and then angiogram of the collateral vasculature to the GDA was performed. No perfusion of the areae of interest observed. Limited angiogram of the right common femoral artery performed. Exoseal was deployed for  closure. Patient tolerated the procedure well and remained hemodynamically stable throughout. No complications were encountered and no significant blood loss encountered. FINDINGS: Ultrasound survey demonstrates mild atherosclerotic changes of the right common femoral artery which is widely patent. Superior mesenteric artery  angiogram demonstrates patent superior mesenteric artery, patent ileal colic artery, patent arcades to the small bowel vasculature. 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 Splenic artery patent, left gastric artery patent, common hepatic artery patent, left hepatic artery and right hepatic artery patent. Large collateral vasculature contributed to the origin of gastroepiploic artery. Perfusion of the hepatic flexure was predominantly via right colic/middle colic branch. Sub selective angiogram of the colic branches demonstrated no active extravasation or pseudoaneurysm. The knee shunting observed with angio dysplasia. Given the tortuosity and the inability to place the tip of the micro catheter within distal branches, no proximal coil was deployed. Instead, course Gel-Foam embolization of the territory was elected as treatment to decrease the pressure to the territory. Puncture of the right common femoral artery. 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 Tortuosity and small vasculature precluded a distal catheter position into the territory for deposition of metallic coils. Deployment of Exoseal for hemostasis. Signed, Dulcy Fanny. Earleen Newport, DO [facility] Radiology Electronically Signed   By: Corrie Mckusick D.O.   On: 05/12/2016 08:22   Dg Chest Port 1 View  Result Date: 05/13/2016 CLINICAL DATA:  Dyspnea. EXAM: PORTABLE CHEST 1 VIEW COMPARISON:  Radiographs of Nov 26, 2008. FINDINGS: Stable cardiomediastinal silhouette. Atherosclerosis of thoracic aorta is noted. No pneumothorax is noted. Right lung is clear. Mild left basilar atelectasis or  infiltrate is noted with minimal associated pleural effusion. Bony thorax is unremarkable. IMPRESSION: Aortic atherosclerosis. Mild left basilar atelectasis or infiltrate is noted with minimal associated pleural effusion. Electronically Signed   By: Marijo Conception, M.D.   On: 05/13/2016 07:30   Dg Knee Left Port  Result Date: 05/12/2016 CLINICAL DATA:  Recent fall.  Pain . EXAM: PORTABLE LEFT KNEE - 1-2 VIEW COMPARISON:  No recent prior. FINDINGS: Mild tricompartment degenerative change. No acute bony or joint abnormality identified. No evidence of fracture or dislocation. IMPRESSION: Mild tricompartment degenerative change.  No acute abnormality Electronically Signed   By: Marcello Moores  Register   On: 05/12/2016 07:32   Dg Abd Portable 1v  Result Date: 05/13/2016 CLINICAL DATA:  Initial evaluation for acute mid abdominal pain. EXAM: PORTABLE ABDOMEN - 1 VIEW COMPARISON:  Prior CT from 05/11/2016. FINDINGS: Multiple prominent gas-filled loops of bowel are seen throughout the abdomen. These loops predominantly reflect the colon, although a few prominent loops of small bowel present as well. No air-fluid levels. No free air on these limited supine views. Imaging findings suggest ileus, although developing distal obstructive process not excluded. No soft tissue mass or abnormal calcification. Visualized lung bases are largely clear. No acute osseous abnormality. IMPRESSION: Multiple prominent gas-filled loops of predominantly large bowel within the abdomen. Findings are favored to reflect an ileus, although a distal obstructive process is not entirely excluded. Close interval follow-up recommended if there is high clinical concern for an the evolving obstructive process. Electronically Signed   By: Jeannine Boga M.D.   On: 05/13/2016 20:31   Ir Ileana Ladd Otilio Saber Hemorr  [place] Guide Roadmapping  Result Date: 05/12/2016 INDICATION: 82 year old male with life-threatening lower GI hemorrhage. CT  angiogram demonstrates hemorrhage within the distribution the right colic artery/middle colic artery near the hepatic flexure. EXAM: SELECTIVE VISCERAL ARTERIOGRAPHY; IR EMBO ART VEN HEMORR LYMPH EXTRAV INC GUIDE ROADMAPPING; IR ULTRASOUND GUIDANCE VASC ACCESS RIGHT; ADDITIONAL ARTERIOGRAPHY; ARTERIOGRAPHY MEDICATIONS: 100 mcg nitro, 4 mg IV Zofran. The antibiotic was administered within 1 hour of the procedure ANESTHESIA/SEDATION: Moderate (conscious) sedation was employed during this procedure. A total of Versed 2.0 mg and Fentanyl 100 mcg was administered intravenously. Moderate Sedation Time: 90 minutes. The patient's level of consciousness and vital signs were monitored continuously by radiology nursing throughout the procedure under my direct supervision. CONTRAST:  160 cc Isovue FLUOROSCOPY TIME:  Fluoroscopy Time: 16 minutes 0 seconds (31.3 mGy). COMPLICATIONS: None PROCEDURE: Informed consent was obtained from the patient following explanation of the procedure, risks, benefits and alternatives. The patient understands, agrees and consents for the procedure. All questions were addressed. A time out was performed prior to the initiation of the procedure. Maximal barrier sterile technique utilized including caps, mask, sterile gowns, sterile gloves, large sterile drape, hand hygiene, and Betadine prep. Ultrasound survey of the right inguinal region was performed with images stored and sent to PACs. A micropuncture needle was used access the right common femoral artery under ultrasound. With excellent arterial blood flow returned, and an .018 micro wire was passed through the needle, observed enter the abdominal aorta under fluoroscopy. The needle was removed, and a micropuncture sheath was placed over the wire. The inner dilator and wire were removed, and an 035 Bentson wire was advanced under fluoroscopy into the abdominal aorta. The sheath was removed and a standard 5 [place] vascular sheath was placed. The  dilator was removed and the sheath was flushed. Standard 5 French C2 Cobra catheter was advanced over the [place] wire. Cobra catheter was used to select the superior mesenteric artery. Angiogram was performed. C2 Cobra was exchanged over the [place] wire for [place] catheter. Micro catheter system was then used through the [place] catheter with 0.016 micro wire and 135cm [place] micro catheter. Right/middle hepatic artery was then selected as the vasculature perfusing the area of interest. Sub selective angiogram performed of branches of the right/ middle artery. Given the significant tortuosity of the vasculature and small caliber, micro catheter was not able to advance to a distal position. Angiogram demonstrated no evidence of active extravasation. 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 Single Gel-Foam pledget infused into target artery of interest, and then withdrawal of the catheter in repeat angiogram demonstrated small additional dysplastic branches at the hepatic flexure. A course Gel-Foam slurry was then infused with no significant filling at the completion. Micro catheter was flushed, withdrawn into the superior mesenteric artery, and then angiogram of the collateral vasculature to the GDA was performed. No perfusion of the areae of interest observed. Limited angiogram of the right common femoral artery performed. Exoseal was deployed for closure. Patient tolerated the procedure well and remained hemodynamically stable throughout. No complications were encountered and no significant blood loss encountered. FINDINGS: Ultrasound survey demonstrates mild atherosclerotic changes of the right common femoral artery which is widely patent. Superior mesenteric artery angiogram demonstrates patent superior mesenteric artery, patent ileal colic artery, patent arcades to the small bowel vasculature. There is engorged tortuous collateral flow to the gastroduodenal artery with retrograde filling of the  celiac artery origin, compatible with celiac artery origin stenosis. Post stenotic dilation of the  proximal celiac artery. Splenic artery patent, left gastric artery patent, common hepatic artery patent, left hepatic artery and right hepatic artery patent. Large collateral vasculature contributed to the origin of gastroepiploic artery. Perfusion of the hepatic flexure was predominantly via right colic/middle colic branch. Sub selective angiogram of the colic branches demonstrated no active extravasation or pseudoaneurysm. The knee shunting observed with angio dysplasia. Given the tortuosity and the inability to place the tip of the micro catheter within distal branches, no proximal coil was deployed. Instead, course Gel-Foam embolization of the territory was elected as treatment to decrease the pressure to the territory. Puncture of the right common femoral artery. 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 Tortuosity and small vasculature precluded a distal catheter position into the territory for deposition of metallic coils. Deployment of Exoseal for hemostasis. Signed, Dulcy Fanny. Earleen Newport, DO [facility] Radiology Electronically Signed   By: Corrie Mckusick D.O.   On: 05/12/2016 08:22   Ct Angio Abd/pel W And/or Wo Contrast  Result Date: 05/11/2016 CLINICAL DATA:  Acute onset of syncope and bright red rectal bleeding. Lightheadedness and generalized weakness. Initial encounter. EXAM: CTA ABDOMEN AND PELVIS wITHOUT AND WITH CONTRAST TECHNIQUE: Multidetector CT imaging of the abdomen and pelvis was performed using the standard protocol during bolus administration of intravenous contrast. Multiplanar reconstructed images and MIPs were obtained and reviewed to evaluate the vascular anatomy. CONTRAST:  75 mL of Isovue 370 IV  contrast COMPARISON:  None. FINDINGS: VASCULAR Aorta: Scattered calcification is seen along the abdominal aorta, without significant luminal narrowing. 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 SMA: The superior mesenteric artery appears patent, without evidence of luminal narrowing. Renals: Mild calcification is noted at the proximal renal arteries bilaterally, without significant luminal narrowing. IMA: The inferior mesenteric artery remains patent. Inflow: Scattered calcification is seen along the common and internal iliac arteries. The external iliac arteries and common femoral arteries remain fully patent bilaterally, with mild calcification at the right common femoral artery. Proximal Outflow: Minimal mural thrombus is suggested at the proximal left superficial femoral artery. Minimal mural thrombus is suggested at the proximal right profunda femoris artery. Veins: Visualized venous structures are grossly unremarkable. The inferior vena cava is partially decompressed and unremarkable in appearance. Review of the MIP images confirms the above findings. NON-VASCULAR Lower chest: Mild bibasilar atelectasis or scarring is noted. Diffuse coronary artery calcifications are seen. The visualized portions of the mediastinum are otherwise unremarkable. Hepatobiliary: The liver is unremarkable in appearance. The gallbladder is unremarkable in appearance. The common bile duct remains normal in caliber. Pancreas: The pancreas is within normal limits. Spleen: The spleen is unremarkable in appearance. Adrenals/Urinary Tract: The adrenal glands are unremarkable in appearance. A large 7.5 cm cyst is noted at the upper pole of the left kidney. Mild nonspecific perinephric stranding is noted bilaterally. Additional scattered small bilateral renal cysts are seen. Mild left-sided renal pelvicaliectasis remains within normal limits, without evidence of  significant hydronephrosis. No renal or ureteral stones are identified. Stomach/Bowel: The stomach is unremarkable in appearance. The small bowel is within normal limits. The appendix is not visualized; there is no evidence for appendicitis. Scattered diverticulosis is noted along the proximal sigmoid colon, without evidence of diverticulitis. There is a large amount of acute extravasation of contrast at the ascending colon, just proximal to the hepatic flexure of the colon,  compatible with active intraluminal hemorrhage. This explains the patient's bright red blood per rectum. Lymphatic: Retroperitoneal nodes are grossly unremarkable in appearance. No pelvic sidewall lymphadenopathy is appreciated. Reproductive: The bladder is mildly distended and grossly unremarkable. The prostate remains normal in size. Other: No additional soft tissue abnormalities are seen. Musculoskeletal: No acute osseous abnormalities are identified. Vacuum phenomenon is noted at L5-S1. Underlying facet disease is noted. The visualized musculature is unremarkable in appearance. IMPRESSION: VASCULAR 1. Large amount of acute extravasation of contrast at the ascending colon, just proximal to the hepatic flexure of the colon, compatible with active intraluminal hemorrhage. This explains the patient's bright red blood per rectum. 2. Scattered aortic atherosclerosis noted. 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 3. Diffuse coronary artery calcifications seen. NON-VASCULAR 1. Mild bibasilar atelectasis or scarring noted. 2. Scattered bilateral renal cysts, measuring up to 7.5 cm on the left. 3. Scattered diverticulosis along the proximal sigmoid colon, without evidence of diverticulitis. These results were called by telephone at the time of interpretation on 05/11/2016 at 9:58 pm to Dr. Carrie Mew, who verbally acknowledged these results. Electronically Signed   By: Garald Balding M.D.   On: 05/11/2016 22:04    Labs:  CBC:  Recent Labs  05/12/16 2238 05/13/16 0222 05/13/16 1154 05/14/16 0251  WBC 48.5* 49.6* 39.4* 22.7*  HGB 8.7* 8.4* 7.6* 9.9*  HCT 25.3* 24.4* 22.1* 28.6*  PLT 80* 69* 76* 61*    COAGS:  Recent Labs  05/11/16 1741  INR 1.25    BMP:  Recent Labs  05/12/16 0355 05/13/16 0222 05/13/16 1154 05/14/16 0251  NA 143 141 138 138  K 4.8 3.9 4.0 3.7  CL 119* 117* 113* 113*  CO2 20* 15* 16* 17*  GLUCOSE 141* 137* 181* 123*  BUN 22* 38* 42* 35*  CALCIUM 7.4* 7.9* 7.8* 7.7*  CREATININE 1.21 1.95* 2.00* 1.39*  GFRNONAA 51* 29* 28* 43*  GFRAA 59* 33* 32* 50*    LIVER FUNCTION TESTS:  Recent Labs  05/11/16 1741 05/14/16 0251  BILITOT 0.5 1.1  AST 22 66*  ALT 9* 23  ALKPHOS 48 56  PROT 5.1* 5.0*  ALBUMIN 3.2* 2.7*    Assessment and Plan:  S/p embolization 10/31 GI plans noted, sounds like colonoscopy delayed a day as pt not completely prepped -IR will continue to follow as needed  Electronically Signed: Ascencion Dike 05/14/2016, 9:54 AM   I spent a total of 15 Minutes at the the patient's bedside AND on the patient's hospital floor or unit, greater than 50% of which was counseling/coordinating care for mesenteric embolization.

## 2016-05-14 NOTE — Progress Notes (Signed)
Daily Rounding Note  05/14/2016, 8:33 AM  LOS: 3 days   SUBJECTIVE:   Chief complaint:     Elevated BP last night.  Drank all but last 6 to 8 oz of prep.  RN not sure if bowels are clear.    OBJECTIVE:         Vital signs in last 24 hours:    Temp:  [97.7 F (36.5 C)-99.3 F (37.4 C)] 99.3 F (37.4 C) (11/02 0400) Pulse Rate:  [84-154] 100 (11/02 0500) Resp:  [17-29] 18 (11/02 0500) BP: (117-203)/(56-107) 165/68 (11/02 0500) SpO2:  [85 %-100 %] 99 % (11/02 0500) Weight:  [58.5 kg (128 lb 15.5 oz)] 58.5 kg (128 lb 15.5 oz) (11/02 0417) Last BM Date: 05/14/16 Filed Weights   05/12/16 0302 05/13/16 0416 05/14/16 0417  Weight: 53.3 kg (117 lb 8.1 oz) 54.9 kg (121 lb 0.5 oz) 58.5 kg (128 lb 15.5 oz)   General: frail, comfortable, fidgety   Heart: RRR Chest: clear bil.  No cough or dyspnea Abdomen: NT, BS hypoactive, minor distention and somewhat firm.  flexiseal in place and effluent is dark brown/red/bloody:  Not clear.     Extremities: no CCE Neuro/Psych:  Pulling at lines and wires, oriented to "17" and his name but not to place.  Seems somewhat confused but fully alert and follows commands.   Mostly appropriate  Intake/Output from previous day: 11/01 0701 - 11/02 0700 In: 2222.5 [P.O.:350; I.V.:735; Blood:715; IV Piggyback:412.5] Out: 650 [Urine:650]  Intake/Output this shift: No intake/output data recorded.  Lab Results:  Recent Labs  05/13/16 0222 05/13/16 1154 05/14/16 0251  WBC 49.6* 39.4* 22.7*  HGB 8.4* 7.6* 9.9*  HCT 24.4* 22.1* 28.6*  PLT 69* 76* 61*   BMET  Recent Labs  05/13/16 0222 05/13/16 1154 05/14/16 0251  NA 141 138 138  K 3.9 4.0 3.7  CL 117* 113* 113*  CO2 15* 16* 17*  GLUCOSE 137* 181* 123*  BUN 38* 42* 35*  CREATININE 1.95* 2.00* 1.39*  CALCIUM 7.9* 7.8* 7.7*   LFT  Recent Labs  05/11/16 1741 05/14/16 0251  PROT 5.1* 5.0*  ALBUMIN 3.2* 2.7*  AST 22 66*  ALT 9*  23  ALKPHOS 48 56  BILITOT 0.5 1.1   PT/INR  Recent Labs  05/11/16 1741  LABPROT 15.8*  INR 1.25   Hepatitis Panel No results for input(s): HEPBSAG, HCVAB, HEPAIGM, HEPBIGM in the last 72 hours.  Studies/Results: Dg Chest Port 1 View  Result Date: 05/13/2016 CLINICAL DATA:  Dyspnea. EXAM: PORTABLE CHEST 1 VIEW COMPARISON:  Radiographs of Nov 26, 2008. FINDINGS: Stable cardiomediastinal silhouette. Atherosclerosis of thoracic aorta is noted. No pneumothorax is noted. Right lung is clear. Mild left basilar atelectasis or infiltrate is noted with minimal associated pleural effusion. Bony thorax is unremarkable. IMPRESSION: Aortic atherosclerosis. Mild left basilar atelectasis or infiltrate is noted with minimal associated pleural effusion. Electronically Signed   By: Marijo Conception, M.D.   On: 05/13/2016 07:30   Dg Abd Portable 1v  Result Date: 05/13/2016 CLINICAL DATA:  Initial evaluation for acute mid abdominal pain. EXAM: PORTABLE ABDOMEN - 1 VIEW COMPARISON:  Prior CT from 05/11/2016. FINDINGS: Multiple prominent gas-filled loops of bowel are seen throughout the abdomen. These loops predominantly reflect the colon, although a few prominent loops of small bowel present as well. No air-fluid levels. No free air on these limited supine views. Imaging findings suggest ileus, although developing distal obstructive process not  excluded. No soft tissue mass or abnormal calcification. Visualized lung bases are largely clear. No acute osseous abnormality. IMPRESSION: Multiple prominent gas-filled loops of predominantly large bowel within the abdomen. Findings are favored to reflect an ileus, although a distal obstructive process is not entirely excluded. Close interval follow-up recommended if there is high clinical concern for an the evolving obstructive process. Electronically Signed   By: Jeannine Boga M.D.   On: 05/13/2016 20:31    ASSESMENT:   *  Lower GI bleed.  S/p 10/31 gelfoam  embloization distal branch of middle colic artery.   Lacitc acid level improved.  Bloody stool continues.    *  Colonic >> SB ileus.  Clinically abd pain improved and no nausea, no tenderness, hypoactive BS.    *  Blood loss anemia.  S/p multiple PRBCs. Latest Hgb at 0300: 9.9.    *  Thrombocytopenia.  Suspect due to consumption.   *  AKI.   Improved.    PLAN   *  Repeat kub now.  Will need more bowel prep.  Case rescheduled for 11AM tomorrow.  Clears today.    [name]  05/14/2016, 8:33 AM Pager: [phone]     Attending physician's note   I have taken an interval history, reviewed the chart and examined the patient. I agree with the Advanced Practitioner's note, impression and recommendations. KUB today similar to yesterdays showing dilated, gas-filled colon and some SB too, favoring ileus over LBO. Clear liquids only. Follow abdominal exam and KUB. Repeat bowel prep and reschedule colonoscopy for tomorrow.   Lucio Edward, MD [facility] [phone] Mon-Fri 8a-5p [phone] after 5p, weekends, holidays

## 2016-05-14 NOTE — Progress Notes (Signed)
[facility] PROGRESS NOTE  Keval P Parson OA:[id number] DOB: 1925-05-01 DOA: 05/11/2016  PCP: [facility]  Brief History/Interval Summary: 82 year old Caucasian male with no significant past medical history and takes only a baby aspirin at home, presented to the emergency department at [facility] with complains of lower GI bleeding. Patient also had an episode of syncope. He was noted to be hypotensive. Patient was aggressively resuscitated with blood transfusions along with FFP and platelets. He underwent CT angiogram of the abdomen which showed a local area of bleeding. Patient was transferred over to [facility] and seen by interventional radiology and underwent embolization.  Reason for Visit: Lower GI bleed  Consultants: Critical care medicine. Gastroenterology. Interventional radiology.  Procedures: Mesenteric angiogram with Gelfoam embolization of the distal branch of the middle colic artery at the hepatic flexure.  Antibiotics: Vancomycin and Zosyn initiated on 11/1  Subjective/Interval History: Patient noted to be confused and somewhat agitated this morning. Unable to obtain much information from him at this time.   ROS: Denies any nausea or vomiting  Objective:  Vital Signs  Vitals:   05/14/16 0400 05/14/16 0417 05/14/16 0455 05/14/16 0500  BP: (!) 203/107  (!) 201/92 (!) 165/68  Pulse: (!) 105   100  Resp: 20   18  Temp: 99.3 F (37.4 C)     TempSrc: Oral     SpO2: 99%   99%  Weight:  58.5 kg (128 lb 15.5 oz)    Height:        Intake/Output Summary (Last 24 hours) at 05/14/16 0757 Last data filed at 05/14/16 0600  Gross per 24 hour  Intake           2222.5 ml  Output              650 ml  Net           1572.5 ml   Filed Weights   05/12/16 0302 05/13/16 0416 05/14/16 0417  Weight: 53.3 kg (117 lb 8.1 oz) 54.9 kg (121 lb 0.5 oz) 58.5 kg (128 lb 15.5 oz)    General appearance: Awake and alert. Somewhat delirious.  Resp: Diminished  air entry at the bases. No wheezing, rales or rhonchi. Cardio: regular rate and rhythm, S1, S2 normal, no murmur, click, rub or gallop GI: Abdomen is noted to be tense and somewhat distended. However, it is nontender. No masses or organomegaly. Bowel sounds are present and high pitched. Rectal tube with bloody stool. Extremities: extremities normal, atraumatic, no cyanosis or edema Pulses: 2+ and symmetric Neurologic: Patient is awake and alert. Disoriented this morning. No facial asymmetry. Tongue is midline. Moving all his extremities.  Lab Results:  Data Reviewed: I have personally reviewed following labs and imaging studies  CBC:  Recent Labs Lab 05/11/16 1741  05/12/16 1607 05/12/16 2238 05/13/16 0222 05/13/16 1154 05/14/16 0251  WBC 16.5*  < > 23.4* 48.5* 49.6* 39.4* 22.7*  NEUTROABS 5.6  --   --   --   --   --   --   HGB 7.8*  < > 9.6* 8.7* 8.4* 7.6* 9.9*  HCT 23.6*  < > 27.5* 25.3* 24.4* 22.1* 28.6*  MCV 93.5  < > 83.8 85.5 87.5 86.0 80.6  PLT 114*  < > 68* 80* 69* 76* 61*  < > = values in this interval not displayed.  Basic Metabolic Panel:  Recent Labs Lab 05/11/16 1741 05/12/16 0355 05/12/16 1057 05/13/16 0222 05/13/16 1154 05/14/16 0251  NA  138 143  --  141 138 138  K 3.5 4.8  --  3.9 4.0 3.7  CL 111 119*  --  117* 113* 113*  CO2 23 20*  --  15* 16* 17*  GLUCOSE 217* 141*  --  137* 181* 123*  BUN 26* 22*  --  38* 42* 35*  CREATININE 1.09 1.21  --  1.95* 2.00* 1.39*  CALCIUM 7.9* 7.4*  --  7.9* 7.8* 7.7*  MG  --  1.6*  --  2.3  --   --   PHOS  --  4.2 5.1*  --   --   --     GFR: Estimated Creatinine Clearance: 29.8 mL/min (by C-G formula based on SCr of 1.39 mg/dL (H)).  Liver Function Tests:  Recent Labs Lab 05/11/16 1741 05/14/16 0251  AST 22 66*  ALT 9* 23  ALKPHOS 48 56  BILITOT 0.5 1.1  PROT 5.1* 5.0*  ALBUMIN 3.2* 2.7*     Recent Labs Lab 05/11/16 1741  LIPASE 37   Coagulation Profile:  Recent Labs Lab 05/11/16 1741  INR  1.25    Cardiac Enzymes:  Recent Labs Lab 05/11/16 1741 05/12/16 0355 05/12/16 1057 05/12/16 1607 05/12/16 2238  TROPONINI <0.03 0.06* 0.05* 0.06* 0.11*    CBG:  Recent Labs Lab 05/11/16 2244  GLUCAP 143*     Recent Results (from the past 240 hour(s))  MRSA PCR Screening     Status: None   Collection Time: 05/11/16 10:38 PM  Result Value Ref Range Status   MRSA by PCR NEGATIVE NEGATIVE Final    Comment:        The GeneXpert MRSA Assay (FDA approved for NASAL specimens only), is one component of a comprehensive MRSA colonization surveillance program. It is not intended to diagnose MRSA infection nor to guide or monitor treatment for MRSA infections.   Culture, blood (routine x 2)     Status: None (Preliminary result)   Collection Time: 05/13/16  6:15 AM  Result Value Ref Range Status   Specimen Description BLOOD RIGHT HAND  Final   Special Requests BOTTLES DRAWN AEROBIC ONLY 5.0CC  Final   Culture NO GROWTH 1 DAY  Final   Report Status PENDING  Incomplete  Culture, blood (routine x 2)     Status: None (Preliminary result)   Collection Time: 05/13/16  6:25 AM  Result Value Ref Range Status   Specimen Description BLOOD LEFT HAND  Final   Special Requests BOTTLES DRAWN AEROBIC AND ANAEROBIC 5 CC EACH  Final   Culture NO GROWTH 1 DAY  Final   Report Status PENDING  Incomplete      Radiology Studies: Dg Chest Port 1 View  Result Date: 05/13/2016 CLINICAL DATA:  Dyspnea. EXAM: PORTABLE CHEST 1 VIEW COMPARISON:  Radiographs of Nov 26, 2008. FINDINGS: Stable cardiomediastinal silhouette. Atherosclerosis of thoracic aorta is noted. No pneumothorax is noted. Right lung is clear. Mild left basilar atelectasis or infiltrate is noted with minimal associated pleural effusion. Bony thorax is unremarkable. IMPRESSION: Aortic atherosclerosis. Mild left basilar atelectasis or infiltrate is noted with minimal associated pleural effusion. Electronically Signed   By: Marijo Conception, M.D.   On: 05/13/2016 07:30   Dg Abd Portable 1v  Result Date: 05/13/2016 CLINICAL DATA:  Initial evaluation for acute mid abdominal pain. EXAM: PORTABLE ABDOMEN - 1 VIEW COMPARISON:  Prior CT from 05/11/2016. FINDINGS: Multiple prominent gas-filled loops of bowel are seen throughout the abdomen. These loops predominantly reflect the  colon, although a few prominent loops of small bowel present as well. No air-fluid levels. No free air on these limited supine views. Imaging findings suggest ileus, although developing distal obstructive process not excluded. No soft tissue mass or abnormal calcification. Visualized lung bases are largely clear. No acute osseous abnormality. IMPRESSION: Multiple prominent gas-filled loops of predominantly large bowel within the abdomen. Findings are favored to reflect an ileus, although a distal obstructive process is not entirely excluded. Close interval follow-up recommended if there is high clinical concern for an the evolving obstructive process. Electronically Signed   By: Jeannine Boga M.D.   On: 05/13/2016 20:31     Medications:  Scheduled: . sodium chloride   Intravenous Once  . famotidine (PEPCID) IV  10 mg Intravenous Q12H  . LORazepam  0.5 mg Intravenous Once  . metoprolol  2.5 mg Intravenous Q6H  . piperacillin-tazobactam (ZOSYN)  IV  3.375 g Intravenous Q8H  . vancomycin  750 mg Intravenous Q48H   Continuous: . dextrose 5 % and 0.45% NaCl 100 mL/hr at 05/14/16 0600   PRN:  Assessment/Plan:  Active Problems:   Acute lower GI bleeding   Gastrointestinal hemorrhage   Acute blood loss anemia   Arterial hypotension   Acute pain of left knee   Slurred speech   Abdominal pain    Lower GI bleed. Patient is status post embolization of the middle colic artery. This was done on 10/31 by interventional radiology. Patient continues to have bloody stool. Gastroenterology is following. Hemoglobin did drop yesterday and he was  transfused 2 units of blood. Hemoglobin has responded appropriately. Colonoscopy was initially planned for today. However, since he has not taken his prep, this has been rescheduled for tomorrow.   Abdominal distention Abdominal films suggest ileus. Continue to monitor for now.  Acute encephalopathy. Patient appears to be somewhat delirious this morning. Reorient periodically. He does not have any focal neurological deficits. This is most likely due to hospital stay and acute illness. No clear indication for imaging studies at this time. There has been no history of falls or trauma.  Acute blood loss anemia secondary to GI bleed. Patient transfused an additional 2 units yesterday. Hemoglobin has responded appropriately. In total, he has received 6 units of PRBC, 1 unit of FFP and 1 unit of platelets so far.   Leukocytosis with questionable infiltrate on chest x-ray Patient had a significant rise in his WBC. No obvious focus of infection is identified except for an abnormality seen on chest x-ray. His lactic acid was noted to be elevated and his pro-calcitonin was also significantly elevated. Blood cultures were obtained and are pending. UA did not show any evidence for UTI. He was given fluid boluses. Lactic acid level has improved. He was started on vancomycin and Zosyn. Pro-calcitonin level is also improved. WBC is improving. Continue to monitor closely for now.   Questionable pneumonia, possible aspiration. See above. Continue antibiotics. Swallowing evaluation.  Hypotension. Secondary to acute hemorrhage. Blood pressures have stabilized and now in the hypertensive range, likely due to his agitation. Start metoprolol intravenously for now. Hydralazine as needed. Patient also noted to have sinus tachycardia, which is also likely secondary to his agitation. Continue to monitor for now.  Acute renal failure with non-anion gap metabolic acidosis Acute renal failure is most likely secondary to  hypovolemia. Patient also received contrast for his CT scan. Monitor urine upper closely. Renal function is improving. Continue IV fluids. Cut back on the rate. Bicarbonate level is also  improving. No renal abnormalities noted on CT scan except for bilateral renal cysts.  Thrombocytopenia Counts are low but stable for the most part. Continue to monitor platelet counts closely. Avoid heparin products.  Questionable slurred speech at the time of initial assessment. Speech noted to be normal today. Thought to be his baseline. Patient does not have any neurological deficits. Continue to monitor.  Left knee pain. X-ray did not show any acute process. Pain appears to have resolved.  DVT Prophylaxis: SCDs    Code Status: Full code  Family Communication: No family at bedside  Disposition Plan: Continue management as outlined above. Continue stepdown status.     LOS: 3 days   [facility] Pager [phone] 05/14/2016, 7:57 AM  If 7PM-7AM, please contact night-coverage at [email or web address], password [facility]

## 2016-05-14 NOTE — Progress Notes (Signed)
Pt BP reading at 201/92. Paged coverage MD. Awaiting med orders. Will continue to monitor.

## 2016-05-14 NOTE — Progress Notes (Signed)
Pt abdomen seemed noticeably distended from yesterday. Pt still receiving bowel prep for colonoscopy for tomorrow. MD notified.

## 2016-05-15 ENCOUNTER — Encounter (HOSPITAL_COMMUNITY)
Admission: EM | Disposition: A | Discharge status: Skilled Nursing Facility | Payer: Self-pay | Source: Other Acute Inpatient Hospital | Attending: Internal Medicine

## 2016-05-15 DIAGNOSIS — G9340 Encephalopathy, unspecified: Secondary | ICD-10-CM

## 2016-05-15 DIAGNOSIS — E876 Hypokalemia: Secondary | ICD-10-CM

## 2016-05-15 LAB — BASIC METABOLIC PANEL
Anion gap: 8 (ref 5–15)
Anion gap: 8 (ref 5–15)
BUN: 22 mg/dL — AB (ref 6–20)
BUN: 19 mg/dL (ref 6–20)
CALCIUM: 8 mg/dL — AB (ref 8.9–10.3)
CHLORIDE: 113 mmol/L — AB (ref 101–111)
CHLORIDE: 112 mmol/L — AB (ref 101–111)
CO2: 22 mmol/L (ref 22–32)
CO2: 23 mmol/L (ref 22–32)
CREATININE: 1.13 mg/dL (ref 0.61–1.24)
CREATININE: 1.06 mg/dL (ref 0.61–1.24)
Calcium: 7.7 mg/dL — ABNORMAL LOW (ref 8.9–10.3)
GFR calc Af Amer: >60 mL/min (ref >60)
GFR calc Af Amer: >60 mL/min (ref >60)
GFR calc non Af Amer: >60 mL/min (ref >60)
GFR, EST NON AFRICAN AMERICAN: 56 mL/min — AB (ref >60)
GLUCOSE: 110 mg/dL — AB (ref 65–99)
Glucose, Bld: 124 mg/dL — ABNORMAL HIGH (ref 65–99)
Potassium: 2.5 mmol/L — CL (ref 3.5–5.1)
Potassium: 2.8 mmol/L — ABNORMAL LOW (ref 3.5–5.1)
SODIUM: 143 mmol/L (ref 135–145)
SODIUM: 143 mmol/L (ref 135–145)

## 2016-05-15 LAB — CBC
HCT: 31.5 % — ABNORMAL LOW (ref 39.0–52.0)
Hemoglobin: 10.9 g/dL — ABNORMAL LOW (ref 13.0–17.0)
MCH: 27.8 pg (ref 26.0–34.0)
MCHC: 34.6 g/dL (ref 30.0–36.0)
MCV: 80.4 fL (ref 78.0–100.0)
PLATELETS: 71 10*3/uL — AB (ref 150–400)
RBC: 3.92 MIL/uL — ABNORMAL LOW (ref 4.22–5.81)
RDW: 18.7 % — AB (ref 11.5–15.5)
WBC: 16.8 10*3/uL — ABNORMAL HIGH (ref 4.0–10.5)

## 2016-05-15 LAB — PROCALCITONIN: PROCALCITONIN: 1.61 ng/mL

## 2016-05-15 LAB — MAGNESIUM: Magnesium: 1.9 mg/dL (ref 1.7–2.4)

## 2016-05-15 SURGERY — CANCELLED PROCEDURE

## 2016-05-15 MED ORDER — POTASSIUM CHLORIDE 10 MEQ/100ML IV SOLN
10.0000 meq | INTRAVENOUS | Status: AC
  Administered 2016-05-15 (×5): 10 meq via INTRAVENOUS
  Filled 2016-05-15 (×5): qty 100

## 2016-05-15 MED ORDER — HALOPERIDOL LACTATE 5 MG/ML IJ SOLN
0.5000 mg | Freq: Once | INTRAMUSCULAR | Status: AC
  Administered 2016-05-15: 0.5 mg via INTRAVENOUS
  Filled 2016-05-15: qty 1

## 2016-05-15 MED ORDER — POTASSIUM CHLORIDE 10 MEQ/100ML IV SOLN
10.0000 meq | Freq: Once | INTRAVENOUS | Status: AC
  Administered 2016-05-15: 10 meq via INTRAVENOUS
  Filled 2016-05-15: qty 100

## 2016-05-15 MED ORDER — THIAMINE HCL 100 MG/ML IJ SOLN
100.0000 mg | Freq: Every day | INTRAMUSCULAR | Status: DC
  Administered 2016-05-15 – 2016-05-17 (×3): 100 mg via INTRAVENOUS
  Filled 2016-05-15 (×3): qty 2

## 2016-05-15 MED ORDER — FAMOTIDINE 20 MG PO TABS
20.0000 mg | ORAL_TABLET | Freq: Two times a day (BID) | ORAL | Status: DC
  Administered 2016-05-15 – 2016-05-18 (×7): 20 mg via ORAL
  Filled 2016-05-15 (×7): qty 1

## 2016-05-15 NOTE — Progress Notes (Signed)
[facility] PROGRESS NOTE  Montae P Solazzo OMV:[id number] DOB: 1924-08-25 DOA: 05/11/2016  PCP: [facility]  Brief History/Interval Summary: 82 year old Caucasian male with no significant past medical history and takes only a baby aspirin at home, presented to the emergency department at [facility] with complains of lower GI bleeding. Patient also had an episode of syncope. He was noted to be hypotensive. Patient was aggressively resuscitated with blood transfusions along with FFP and platelets. He underwent CT angiogram of the abdomen which showed a local area of bleeding. Patient was transferred over to [facility] and seen by interventional radiology and underwent embolization. Gastroenterology was consulted. Plan is for colonoscopy. However, patient has not been cooperative with his prep.  Reason for Visit: Lower GI bleed  Consultants: Critical care medicine. Gastroenterology. Interventional radiology.  Procedures: Mesenteric angiogram with Gelfoam embolization of the distal branch of the middle colic artery at the hepatic flexure.  Antibiotics: Vancomycin and Zosyn initiated on 11/1  Subjective/Interval History: Patient noted to be agitated this morning. Unable to obtain much information from him at this time.   ROS: Denies any nausea or vomiting. Stool has cleared up without any further evidence of bleeding.  Objective:  Vital Signs  Vitals:   05/14/16 0846 05/14/16 1958 05/15/16 0002 05/15/16 0400  BP: (!) 156/88 (!) 182/95 (!) 154/74 (!) 166/92  Pulse: (!) 116 98 (!) 103 99  Resp: (!) 28  20 (!) 26  Temp: 98.4 F (36.9 C) 99 F (37.2 C) 97.9 F (36.6 C) 98.3 F (36.8 C)  TempSrc: Oral Axillary Oral Oral  SpO2: 95% 93% 93% 90%  Weight:    58.4 kg (128 lb 12 oz)  Height:        Intake/Output Summary (Last 24 hours) at 05/15/16 0731 Last data filed at 05/15/16 0100  Gross per 24 hour  Intake             1655 ml  Output             1125 ml    Net              530 ml   Filed Weights   05/13/16 0416 05/14/16 0417 05/15/16 0400  Weight: 54.9 kg (121 lb 0.5 oz) 58.5 kg (128 lb 15.5 oz) 58.4 kg (128 lb 12 oz)    General appearance: Awake and alert. Somewhat Agitated.  Resp: Diminished air entry at the bases. No wheezing, rales or rhonchi. Cardio: regular rate and rhythm, S1, S2 normal, no murmur, click, rub or gallop GI: Abdomen is less tense and less distended today. Nontender. No masses or organomegaly. Bowel sounds are present and appear to be normal today. Rectal tube with brown stool. Extremities: extremities normal, atraumatic, no cyanosis or edema Pulses: 2+ and symmetric Neurologic: Patient is awake and alert. Disoriented this morning. No facial asymmetry. Tongue is midline. Moving all his extremities.  Lab Results:  Data Reviewed: I have personally reviewed following labs and imaging studies  CBC:  Recent Labs Lab 05/11/16 1741  05/13/16 0222 05/13/16 1154 05/14/16 0251 05/14/16 1441 05/15/16 0132  WBC 16.5*  < > 49.6* 39.4* 22.7* 24.1* 16.8*  NEUTROABS 5.6  --   --   --   --   --   --   HGB 7.8*  < > 8.4* 7.6* 9.9* 10.7* 10.9*  HCT 23.6*  < > 24.4* 22.1* 28.6* 31.4* 31.5*  MCV 93.5  < > 87.5 86.0 80.6 80.7 80.4  PLT 114*  < >  69* 76* 61* 71* 71*  < > = values in this interval not displayed.  Basic Metabolic Panel:  Recent Labs Lab 05/12/16 0355 05/12/16 1057 05/13/16 0222 05/13/16 1154 05/14/16 0251 05/15/16 0132  NA 143  --  141 138 138 143  K 4.8  --  3.9 4.0 3.7 2.5*  CL 119*  --  117* 113* 113* 113*  CO2 20*  --  15* 16* 17* 22  GLUCOSE 141*  --  137* 181* 123* 124*  BUN 22*  --  38* 42* 35* 22*  CREATININE 1.21  --  1.95* 2.00* 1.39* 1.13  CALCIUM 7.4*  --  7.9* 7.8* 7.7* 8.0*  MG 1.6*  --  2.3  --   --   --   PHOS 4.2 5.1*  --   --   --   --     GFR: Estimated Creatinine Clearance: 36.6 mL/min (by C-G formula based on SCr of 1.13 mg/dL).  Liver Function Tests:  Recent Labs Lab  05/11/16 1741 05/14/16 0251  AST 22 66*  ALT 9* 23  ALKPHOS 48 56  BILITOT 0.5 1.1  PROT 5.1* 5.0*  ALBUMIN 3.2* 2.7*     Recent Labs Lab 05/11/16 1741  LIPASE 37   Coagulation Profile:  Recent Labs Lab 05/11/16 1741  INR 1.25    Cardiac Enzymes:  Recent Labs Lab 05/11/16 1741 05/12/16 0355 05/12/16 1057 05/12/16 1607 05/12/16 2238  TROPONINI <0.03 0.06* 0.05* 0.06* 0.11*    CBG:  Recent Labs Lab 05/11/16 2244  GLUCAP 143*     Recent Results (from the past 240 hour(s))  MRSA PCR Screening     Status: None   Collection Time: 05/11/16 10:38 PM  Result Value Ref Range Status   MRSA by PCR NEGATIVE NEGATIVE Final    Comment:        The GeneXpert MRSA Assay (FDA approved for NASAL specimens only), is one component of a comprehensive MRSA colonization surveillance program. It is not intended to diagnose MRSA infection nor to guide or monitor treatment for MRSA infections.   Culture, blood (routine x 2)     Status: None (Preliminary result)   Collection Time: 05/13/16  6:15 AM  Result Value Ref Range Status   Specimen Description BLOOD RIGHT HAND  Final   Special Requests BOTTLES DRAWN AEROBIC ONLY 5.0CC  Final   Culture NO GROWTH 1 DAY  Final   Report Status PENDING  Incomplete  Culture, blood (routine x 2)     Status: None (Preliminary result)   Collection Time: 05/13/16  6:25 AM  Result Value Ref Range Status   Specimen Description BLOOD LEFT HAND  Final   Special Requests BOTTLES DRAWN AEROBIC AND ANAEROBIC 5 CC EACH  Final   Culture NO GROWTH 1 DAY  Final   Report Status PENDING  Incomplete      Radiology Studies: Dg Abd 1 View  Result Date: 05/14/2016 CLINICAL DATA:  Abdominal pain and gas pain. EXAM: ABDOMEN - 1 VIEW COMPARISON:  05/13/2016 FINDINGS: Again noted are gas-filled loops of bowel throughout the abdomen. The most dilated loops of bowel appear to represent colon. There is some small bowel gas distension. Limited evaluation  for free air on this single supine image. IMPRESSION: Gas-filled loops of bowel throughout the abdomen. Minimal change from the previous examination and findings may represent an ileus. Electronically Signed   By: Markus Daft M.D.   On: 05/14/2016 10:51   Dg Abd Portable 1v  Result  Date: 05/13/2016 CLINICAL DATA:  Initial evaluation for acute mid abdominal pain. EXAM: PORTABLE ABDOMEN - 1 VIEW COMPARISON:  Prior CT from 05/11/2016. FINDINGS: Multiple prominent gas-filled loops of bowel are seen throughout the abdomen. These loops predominantly reflect the colon, although a few prominent loops of small bowel present as well. No air-fluid levels. No free air on these limited supine views. Imaging findings suggest ileus, although developing distal obstructive process not excluded. No soft tissue mass or abnormal calcification. Visualized lung bases are largely clear. No acute osseous abnormality. IMPRESSION: Multiple prominent gas-filled loops of predominantly large bowel within the abdomen. Findings are favored to reflect an ileus, although a distal obstructive process is not entirely excluded. Close interval follow-up recommended if there is high clinical concern for an the evolving obstructive process. Electronically Signed   By: Jeannine Boga M.D.   On: 05/13/2016 20:31     Medications:  Scheduled: . sodium chloride   Intravenous Once  . famotidine (PEPCID) IV  10 mg Intravenous Q12H  . LORazepam  0.5 mg Intravenous Once  . metoprolol  2.5 mg Intravenous Q6H  . peg 3350 powder  0.5 kit Oral Once  . piperacillin-tazobactam (ZOSYN)  IV  3.375 g Intravenous Q8H  . potassium chloride  10 mEq Intravenous Q1 Hr x 6  . vancomycin  750 mg Intravenous Q48H   Continuous: . dextrose 5 % and 0.45% NaCl 50 mL/hr at 05/15/16 0600   PRN:  Assessment/Plan:  Active Problems:   Acute lower GI bleeding   Gastrointestinal hemorrhage   Acute blood loss anemia   Arterial hypotension   Acute pain of  left knee   Slurred speech   Abdominal pain   Ileus (HCC)    Lower GI bleed. Patient is status post embolization of the middle colic artery. This was done on 10/31 by interventional radiology. Patient stool has cleared up. No more further blood is noted in the rectal tube. Hemoglobin remains stable. Gastroenterology is following. Colonoscopy continues to remain on hold due to inability of the patient take his prep at this time.  Abdominal distention/Ileus Clinically, appears to have improved. Continue to monitor. Okay to advance diet.   Acute encephalopathy. Patient remains delirious and agitated. QT interval was noted to be normal. He'll be given 1 dose of Haldol to see if he has any response. Reorient periodically. He does not have any focal neurological deficits. This is most likely due to hospital stay and acute illness. No clear indication for imaging studies at this time. There has been no history of falls or trauma. Check B-12 and TSH.  Acute blood loss anemia secondary to GI bleed. Hemoglobin has responded appropriately to blood transfusion. Last transfused on 11/1. In total, he has received 6 units of PRBC, 1 unit of FFP and 1 unit of platelets so far.   Leukocytosis with questionable infiltrate on chest x-ray Patient had a significant rise in his WBC. No obvious focus of infection is identified except for an abnormality seen on chest x-ray. His lactic acid was noted to be elevated and his pro-calcitonin was also significantly elevated. Blood cultures were obtained and are negative so far. UA did not show any evidence for UTI. He was given fluid boluses. Lactic acid level has improved. He was started on vancomycin and Zosyn. Pro-calcitonin level is also improved. WBC is improving. Continue current antibiotics for an additional day. Anticipate transition to oral antibiotics tomorrow.  Questionable pneumonia, possible aspiration. See above. Continue antibiotics. Swallowing evaluation is  pending.  Hypotension. Secondary to acute GI hemorrhage. Blood pressures have stabilized and now in the hypertensive range, likely due to his agitation. Continue metoprolol intravenously for now. Hydralazine as needed. Patient also noted to have sinus tachycardia, which is also likely secondary to his agitation. Continue to monitor for now.  Acute renal failure with non-anion gap metabolic acidosis Acute renal failure is most likely secondary to hypovolemia. Patient also received contrast for his CT scan. Monitor urine upper closely. Renal function is improving. Continue IV fluids at a lower rate. Bicarbonate level is also improving. No renal abnormalities noted on CT scan except for bilateral renal cysts.  Hypokalemia Potassium to be repleted intravenously. Check magnesium. Repeat labs later today.  Thrombocytopenia Counts are low but stable for the most part. Continue to monitor platelet counts closely. Avoid heparin products.  Questionable slurred speech at the time of initial assessment. Speech noted to be normal now. Patient does not have any neurological deficits. Continue to monitor.  Left knee pain. X-ray did not show any acute process. Pain appears to have resolved.  DVT Prophylaxis: SCDs    Code Status: Full code  Family Communication: No family at bedside  Disposition Plan: Continue management as outlined above. Continue stepdown status.     LOS: 4 days   [facility] Pager [phone] 05/15/2016, 7:31 AM  If 7PM-7AM, please contact night-coverage at [email or web address], password [facility]

## 2016-05-15 NOTE — Progress Notes (Signed)
Patient ID: Dustin Howell, male   DOB: 09-08-1924, 82 y.o.   MRN: [id number]  CC: lower GI bleed S/p embolization of middle colic artery [id number] by Dr. Earleen Newport  Patient reports no bleeding Colonoscopy cancelled for now, non urgent, unable to complete bowel prep  BP (!) 171/86 (BP Location: Left Arm)   Pulse 86   Temp 98.7 F (37.1 C) (Oral)   Resp (!) 24   Ht 5\' 7"  (1.702 m)   Wt 128 lb 12 oz (58.4 kg)   SpO2 96%   BMI 20.16 kg/m   Hb trending up: 9.9 >>10.7>>10.9 WBC trending down:22.7>>24.1>>16.8  Further plans per GI, CCM, TH

## 2016-05-15 NOTE — Evaluation (Signed)
Clinical/Bedside Swallow Evaluation Patient Details  Name: Dustin Howell MRN: [id number] Date of Birth: 10-30-1924  Today's Date: 05/15/2016 Time: SLP Start Time (ACUTE ONLY): 61 SLP Stop Time (ACUTE ONLY): 1255 SLP Time Calculation (min) (ACUTE ONLY): 25 min  Past Medical History:  Past Medical History:  Diagnosis Date  . GI bleed   . HOH (hard of hearing)    Past Surgical History:  Past Surgical History:  Procedure Laterality Date  . IR GENERIC HISTORICAL  05/12/2016   IR EMBO ART  VEN HEMORR LYMPH EXTRAV  INC GUIDE ROADMAPPING 05/12/2016 Corrie Mckusick, DO [facility]  . IR GENERIC HISTORICAL  05/12/2016   IR ANGIOGRAM VISCERAL SELECTIVE 05/12/2016 Corrie Mckusick, DO [facility]  . IR GENERIC HISTORICAL  05/12/2016   IR US GUIDE VASC ACCESS RIGHT 05/12/2016 Corrie Mckusick, DO [facility]  . IR GENERIC HISTORICAL  05/12/2016   IR ANGIOGRAM SELECTIVE EACH ADDITIONAL VESSEL 05/12/2016 Corrie Mckusick, DO [facility]  . IR GENERIC HISTORICAL  05/12/2016   IR ANGIOGRAM SELECTIVE EACH ADDITIONAL VESSEL 05/12/2016 Corrie Mckusick, DO [facility]  . IR GENERIC HISTORICAL  05/12/2016   IR ANGIOGRAM FOLLOW UP STUDY 05/12/2016 Corrie Mckusick, DO [facility]  . IR GENERIC HISTORICAL  05/12/2016   IR ANGIOGRAM SELECTIVE EACH ADDITIONAL VESSEL 05/12/2016 Corrie Mckusick, DO [facility]   HPI:  82 year old Caucasian male with no significant past medical history and takes only a baby aspirin at home, presented to the emergency department at [facility] with complains of lower GI bleeding. Dx GI bleed, acute blood loss anemia, acute left knee pain, ileus, acute encephalopathy, concern for aspiration per MD notes.    Assessment / Plan / Recommendation Clinical Impression  Pt quite confused, mildly agitated, needed constant redirection.  Presented with adequate oral manipulation, brisk swallow response, no overt s/s of aspiration at initiation of assessment.  Near end of study, pt displayed increased  WOB and RR, raising concerns for coordination of respiration/swallowing.  Recommend continuing full liquid diet, meds whole in puree; however, if pt demonstrates coughing during meals, please hold tray and call weekend SLP (text pager [phone]).  D/W RN.      Aspiration Risk  Mild aspiration risk    Diet Recommendation   full liquids  Medication Administration: Whole meds with puree    Other  Recommendations Oral Care Recommendations: Oral care BID   Follow up Recommendations None      Frequency and Duration min 2x/week  1 week       Prognosis Prognosis for Safe Diet Advancement: Good      Swallow Study   General HPI: 82 year old Caucasian male with no significant past medical history and takes only a baby aspirin at home, presented to the emergency department at [facility] with complains of lower GI bleeding. Dx GI bleed, acute blood loss anemia, acute left knee pain, ileus, acute encephalopathy, concern for aspiration per MD notes.  Type of Study: Bedside Swallow Evaluation Previous Swallow Assessment: no Diet Prior to this Study: Other (Comment) (full liquids) Temperature Spikes Noted: No Respiratory Status: Nasal cannula History of Recent Intubation: No Behavior/Cognition: Alert;Confused;Agitated;Impulsive Oral Cavity Assessment: Within Functional Limits Oral Care Completed by SLP: No Oral Cavity - Dentition: Dentures, top Vision: Functional for self-feeding Self-Feeding Abilities: Able to feed self;Needs assist Patient Positioning: Upright in bed Baseline Vocal Quality: Normal Volitional Cough: Strong Volitional Swallow: Able to elicit    Oral/Motor/Sensory Function Overall Oral Motor/Sensory Function: Within functional limits   Ice Chips Ice chips: Within functional limits  Thin Liquid Thin Liquid: Within functional limits Presentation: Cup;Straw    Nectar Thick Nectar Thick Liquid: Not tested   Honey Thick Honey Thick Liquid: Not tested   Puree Puree: Within  functional limits Presentation: Spoon   Solid   GO   Solid: Not tested        Dustin Howell 05/15/2016,2:07 PM

## 2016-05-15 NOTE — Progress Notes (Signed)
CRITICAL VALUE ALERT  Critical value received:  K+2.5  Date of notification:  05/15/16  Time of notification:  0315  Critical value read back:Yes.    Nurse who received alert:  Malen Gauze  MD notified (1st page):  Lamar Blinks NP  Time of first page:  0340  MD notified (2nd page):  Time of second page:  Responding MD:  Lamar Blinks NP  Time MD responded:  [phone]

## 2016-05-15 NOTE — Progress Notes (Signed)
Pt refusing to take Moviprep for scheduled colonoscopy today 05/15/16. Pt educated about the importance of the procedure. MD notified. Will continue to encourage pt to drink moviprep.

## 2016-05-15 NOTE — Evaluation (Signed)
Physical Therapy Evaluation Patient Details Name: Dustin Howell MRN: [id number] DOB: 06-19-1925 Today's Date: 05/15/2016   History of Present Illness  Pt is an 82 y/o male admitted secondary to GI bleed, weakness, hypotension and encephalopathy. No PMH on file.  Clinical Impression  Pt presented supine in bed with HOB elevated, awake and willing to participate in therapy session. Pt's daughter and nephew present throughout session as well and answered questions regarding pt. Prior to admission, pt was independent with all functional mobility and continued to drive. They report that he was a very active man. Pt required mod-max A with bed mobility, mod A for transfers and mod A to maintain upright standing posture. Pt tolerated standing for approximately 15 minutes for hygiene and took 8-10 side steps at bedside with min-mod A. Pt would continue to benefit from skilled physical therapy services at this time while admitted and after d/c to address his below listed limitations in order to improve his overall safety and independence with functional mobility.     Follow Up Recommendations SNF;Supervision/Assistance - 24 hour    Equipment Recommendations  None recommended by PT    Recommendations for Other Services       Precautions / Restrictions Precautions Precautions: Fall Restrictions Weight Bearing Restrictions: No      Mobility  Bed Mobility Overal bed mobility: Needs Assistance Bed Mobility: Rolling;Supine to Sit;Sit to Supine Rolling: Mod assist   Supine to sit: Mod assist;HOB elevated Sit to supine: Max assist   General bed mobility comments: pt required increased time, VC'ing for sequencing and mod A at trunk to achieve full sitting at EOB. Max A required to return to supine.  Transfers Overall transfer level: Needs assistance Equipment used: Rolling walker (2 wheeled) Transfers: Sit to/from Stand Sit to Stand: Mod assist         General transfer comment: pt required  increased time, VC'ing for bilateral hand placement and mod A to achieve full standing from bed  Ambulation/Gait             General Gait Details: pt took side steps towards R at bedside (8-10 side steps) with min A  Stairs            Wheelchair Mobility    Modified Rankin (Stroke Patients Only)       Balance Overall balance assessment: Needs assistance Sitting-balance support: Feet supported;Bilateral upper extremity supported Sitting balance-Leahy Scale: Poor     Standing balance support: During functional activity;Bilateral upper extremity supported Standing balance-Leahy Scale: Poor Standing balance comment: pt with LOB x2 posteriorly upon standing that required mod A to maintain upright standing position                             Pertinent Vitals/Pain Pain Assessment: No/denies pain    Home Living Family/patient expects to be discharged to:: Skilled nursing facility Living Arrangements: Alone                    Prior Function Level of Independence: Independent         Comments: pt's daughter and nephew stated that pt was very active prior to admission and very independent with everything     Hand Dominance        Extremity/Trunk Assessment   Upper Extremity Assessment: Generalized weakness           Lower Extremity Assessment: Generalized weakness         Communication  Communication: HOH  Cognition Arousal/Alertness: Awake/alert Behavior During Therapy: WFL for tasks assessed/performed Overall Cognitive Status: Impaired/Different from baseline Area of Impairment: Orientation;Attention;Memory;Following commands;Safety/judgement;Awareness Orientation Level: Disoriented to;Place;Time;Situation Current Attention Level: Selective Memory: Decreased short-term memory Following Commands: Follows one step commands inconsistently Safety/Judgement: Decreased awareness of safety;Decreased awareness of deficits           General Comments      Exercises     Assessment/Plan    PT Assessment Patient needs continued PT services  PT Problem List Decreased strength;Decreased range of motion;Decreased activity tolerance;Decreased balance;Decreased mobility;Decreased coordination;Decreased knowledge of use of DME;Decreased safety awareness          PT Treatment Interventions DME instruction;Gait training;Functional mobility training;Therapeutic activities;Therapeutic exercise;Balance training;Neuromuscular re-education;Patient/family education    PT Goals (Current goals can be found in the Care Plan section)  Acute Rehab PT Goals Patient Stated Goal: get OOB PT Goal Formulation: With patient/family Time For Goal Achievement: 05/29/16 Potential to Achieve Goals: Fair    Frequency Min 3X/week   Barriers to discharge        Co-evaluation               End of Session Equipment Utilized During Treatment: Gait belt Activity Tolerance: Patient tolerated treatment well Patient left: in bed;with call bell/phone within reach;with bed alarm set;with family/visitor present;with restraints reapplied Nurse Communication: Mobility status         Time: [id number] PT Time Calculation (min) (ACUTE ONLY): 44 min   Charges:   PT Evaluation $PT Eval Moderate Complexity: 1 Procedure PT Treatments $Therapeutic Activity: 23-37 mins   PT G CodesClearnce Sorrel Don Giarrusso 05/15/2016, 3:15 PM Sherie Don, [place], DPT [phone]

## 2016-05-15 NOTE — Care Management Note (Signed)
Case Management Note  Patient Details  Name: Dustin Howell MRN: [id number] Date of Birth: [birth year]  Subjective/Objective:  Pt admitted on 05/11/16 with GI bleed, weakness, hypotension, and encephalopathy.  PTA, pt independent, lives alone.                    Action/Plan: PT evaluation today; recommending SNF at dc.  Will consult CSW to facilitate possible dc to SNF upon dc.  Will follow progress.   Expected Discharge Date:                  Expected Discharge Plan:  Skilled Nursing Facility  In-House Referral:  Clinical Social Work  Discharge planning Services  CM Consult  Post Acute Care Choice:    Choice offered to:     DME Arranged:    DME Agency:     HH Arranged:    [place] Agency:     Status of Service:  In process, will continue to follow  If discussed at Long Length of Stay Meetings, dates discussed:    Additional Comments:  Ella Bodo, RN 05/15/2016, 3:41 PM

## 2016-05-15 NOTE — Progress Notes (Signed)
Daily Rounding Note  05/15/2016, 8:29 AM  LOS: 4 days   SUBJECTIVE:   Chief complaint: left hip pain.  From his rambling history this comes and goes.    Denies abdominal pain, n/v, trouble breathing  OBJECTIVE:         Vital signs in last 24 hours:    Temp:  [97.9 F (36.6 C)-99 F (37.2 C)] 98.6 F (37 C) (11/03 0826) Pulse Rate:  [98-116] 99 (11/03 0400) Resp:  [16-28] 16 (11/03 0826) BP: (154-182)/(74-95) 155/86 (11/03 0826) SpO2:  [90 %-95 %] 90 % (11/03 0400) Weight:  [58.4 kg (128 lb 12 oz)] 58.4 kg (128 lb 12 oz) (11/03 0400) Last BM Date: 05/14/16 Filed Weights   05/13/16 0416 05/14/16 0417 05/15/16 0400  Weight: 54.9 kg (121 lb 0.5 oz) 58.5 kg (128 lb 15.5 oz) 58.4 kg (128 lb 12 oz)   General: elderly, frail, comfortable.  Does not look acutely ill.  Active in so far as soft wrist and mitten restraints allow.    Heart: RRR Chest: clear bil in front Abdomen: ND. NT. Active BS. Flexiseal still in place, scant watery brown stool with hint of red (less bloody than 11/2).    Extremities: no CCE Neuro/Psych:  Oriented to self, his birthdate but not to year or town Dustin Howell).  Follows commands.  Rambling speech that is not necessarily on topic but cohesive.   Intake/Output from previous day: 11/02 0701 - 11/03 0700 In: 2305 [I.V.:1880; IV Piggyback:425] Out: 1125 [Urine:875; Stool:250]  Intake/Output this shift: Total I/O In: 100 [IV Piggyback:100] Out: -   Lab Results:  Recent Labs  05/14/16 0251 05/14/16 1441 05/15/16 0132  WBC 22.7* 24.1* 16.8*  HGB 9.9* 10.7* 10.9*  HCT 28.6* 31.4* 31.5*  PLT 61* 71* 71*   BMET  Recent Labs  05/13/16 1154 05/14/16 0251 05/15/16 0132  NA 138 138 143  K 4.0 3.7 2.5*  CL 113* 113* 113*  CO2 16* 17* 22  GLUCOSE 181* 123* 124*  BUN 42* 35* 22*  CREATININE 2.00* 1.39* 1.13  CALCIUM 7.8* 7.7* 8.0*   LFT  Recent Labs  05/14/16 0251  PROT 5.0*    ALBUMIN 2.7*  AST 66*  ALT 23  ALKPHOS 56  BILITOT 1.1    Studies/Results: Dg Abd 1 View  Result Date: 05/14/2016 CLINICAL DATA:  Abdominal pain and gas pain. EXAM: ABDOMEN - 1 VIEW COMPARISON:  05/13/2016 FINDINGS: Again noted are gas-filled loops of bowel throughout the abdomen. The most dilated loops of bowel appear to represent colon. There is some small bowel gas distension. Limited evaluation for free air on this single supine image. IMPRESSION: Gas-filled loops of bowel throughout the abdomen. Minimal change from the previous examination and findings may represent an ileus. Electronically Signed   By: Markus Daft M.D.   On: 05/14/2016 10:51   Dg Abd Portable 1v  Result Date: 05/13/2016 CLINICAL DATA:  Initial evaluation for acute mid abdominal pain. EXAM: PORTABLE ABDOMEN - 1 VIEW COMPARISON:  Prior CT from 05/11/2016. FINDINGS: Multiple prominent gas-filled loops of bowel are seen throughout the abdomen. These loops predominantly reflect the colon, although a few prominent loops of small bowel present as well. No air-fluid levels. No free air on these limited supine views. Imaging findings suggest ileus, although developing distal obstructive process not excluded. No soft tissue mass or abnormal calcification. Visualized lung bases are largely clear. No acute osseous abnormality. IMPRESSION: Multiple prominent gas-filled loops  of predominantly large bowel within the abdomen. Findings are favored to reflect an ileus, although a distal obstructive process is not entirely excluded. Close interval follow-up recommended if there is high clinical concern for an the evolving obstructive process. Electronically Signed   By: Jeannine Boga M.D.   On: 05/13/2016 20:31    Scheduled Meds: . sodium chloride   Intravenous Once  . famotidine  20 mg Oral BID  . LORazepam  0.5 mg Intravenous Once  . metoprolol  2.5 mg Intravenous Q6H  . piperacillin-tazobactam (ZOSYN)  IV  3.375 g Intravenous Q8H   . potassium chloride  10 mEq Intravenous Q1 Hr x 6  . vancomycin  750 mg Intravenous Q48H   Continuous Infusions: . dextrose 5 % and 0.45% NaCl 50 mL/hr at 05/15/16 0700   PRN Meds:.alum & mag hydroxide-simeth   ASSESMENT:   * Lower GI bleed. S/p 10/31 gelfoam embloization distal branch of middle colic artery.  Lacitc acid level improved. Stool less bloody.  Unable to prep pt for colonoscopy and no urgency to complete this as pt clinically stable, improved.  WBCs, lactic acid  Improved. Cancelled colonoscopy and will revisit this over next few days. Remains on Vanc and Zosyn.   *  Colonic >> SB ileus.  Clinically resolved.   * Blood loss anemia. S/p multiple PRBCs, last were on 11/1. Hgb improving  *  Hypokalemia, 6 runs K ordered.   * Thrombocytopenia, stable. Suspect due to consumption.   * AKI.  Improved.   *  Confusion, agitation.     PLAN   *  Allow full liquids.  Note SLP bedside eval ordered.   *  ? Need for ongoing flexiseal?  Prolonged presence can lead to ulcers, potetial bleeding.    [name]  05/15/2016, 8:29 AM Pager: [phone]     Attending physician's note   I have taken an interval history, reviewed the chart and examined the patient. I agree with the Advanced Practitioner's note, impression and recommendations. Colonic/SB ileus has clinically resolved-possible bowel ischemic with initial bleed and hypotension. Hb remains stable. He remains confused and agitated so he is unable to prep for colonoscopy. Replace K and advance diet. Colonoscopy when he is able to prep.   Lucio Edward, MD [facility] [phone] Mon-Fri 8a-5p [phone] after 5p, weekends, holidays

## 2016-05-15 NOTE — Progress Notes (Signed)
Spoke to on call MD about pt's noncompliance with moviprep for scheduled colonoscopy this morning 05/15/16. MD agreed pt will not be eligible for surgery due to noncompliance and can discontinue moviprep intake.

## 2016-05-15 NOTE — Care Management Important Message (Signed)
Important Message  Patient Details  Name: DAMARIYON HARSIN MRN: [id number] Date of Birth: 1925-02-08   Medicare Important Message Given:  Yes    Shahab Polhamus Abena 05/15/2016, 10:35 AM

## 2016-05-16 DIAGNOSIS — G9340 Encephalopathy, unspecified: Secondary | ICD-10-CM

## 2016-05-16 LAB — BASIC METABOLIC PANEL
ANION GAP: 6 (ref 5–15)
Anion gap: 7 (ref 5–15)
BUN: 17 mg/dL (ref 6–20)
BUN: 15 mg/dL (ref 6–20)
CALCIUM: 8.1 mg/dL — AB (ref 8.9–10.3)
CHLORIDE: 112 mmol/L — AB (ref 101–111)
CO2: 25 mmol/L (ref 22–32)
CO2: 25 mmol/L (ref 22–32)
Calcium: 7.8 mg/dL — ABNORMAL LOW (ref 8.9–10.3)
Chloride: 113 mmol/L — ABNORMAL HIGH (ref 101–111)
Creatinine, Ser: 1 mg/dL (ref 0.61–1.24)
Creatinine, Ser: 0.99 mg/dL (ref 0.61–1.24)
GFR calc non Af Amer: >60 mL/min (ref >60)
Glucose, Bld: 104 mg/dL — ABNORMAL HIGH (ref 65–99)
Glucose, Bld: 120 mg/dL — ABNORMAL HIGH (ref 65–99)
POTASSIUM: 2.8 mmol/L — AB (ref 3.5–5.1)
Potassium: 3.7 mmol/L (ref 3.5–5.1)
SODIUM: 144 mmol/L (ref 135–145)
Sodium: 144 mmol/L (ref 135–145)

## 2016-05-16 LAB — CBC
HCT: 27.3 % — ABNORMAL LOW (ref 39.0–52.0)
HEMOGLOBIN: 9.3 g/dL — AB (ref 13.0–17.0)
MCH: 27.7 pg (ref 26.0–34.0)
MCHC: 34.1 g/dL (ref 30.0–36.0)
MCV: 81.3 fL (ref 78.0–100.0)
Platelets: 70 10*3/uL — ABNORMAL LOW (ref 150–400)
RBC: 3.36 MIL/uL — AB (ref 4.22–5.81)
RDW: 19 % — ABNORMAL HIGH (ref 11.5–15.5)
WBC: 13.6 10*3/uL — ABNORMAL HIGH (ref 4.0–10.5)

## 2016-05-16 LAB — TSH: TSH: 4.132 u[IU]/mL (ref 0.350–4.500)

## 2016-05-16 LAB — VITAMIN B12: VITAMIN B 12: 350 pg/mL (ref 180–914)

## 2016-05-16 MED ORDER — POTASSIUM CHLORIDE 10 MEQ/100ML IV SOLN: 10.0000 meq | INTRAVENOUS | Status: DC

## 2016-05-16 MED ORDER — POTASSIUM CHLORIDE CRYS ER 20 MEQ PO TBCR
40.0000 meq | EXTENDED_RELEASE_TABLET | Freq: Once | ORAL | Status: AC
  Administered 2016-05-16: 40 meq via ORAL
  Filled 2016-05-16: qty 2

## 2016-05-16 MED ORDER — SODIUM CHLORIDE 0.9 % IV SOLN
30.0000 meq | Freq: Once | INTRAVENOUS | Status: AC
  Administered 2016-05-16: 30 meq via INTRAVENOUS
  Filled 2016-05-16: qty 15

## 2016-05-16 MED ORDER — SODIUM CHLORIDE 0.9 % IV SOLN
3.0000 g | Freq: Three times a day (TID) | INTRAVENOUS | Status: DC
  Administered 2016-05-16 – 2016-05-18 (×7): 3 g via INTRAVENOUS
  Filled 2016-05-16 (×10): qty 3

## 2016-05-16 MED ORDER — VANCOMYCIN HCL IN DEXTROSE 1-5 GM/200ML-% IV SOLN
1000.0000 mg | INTRAVENOUS | Status: DC
  Filled 2016-05-16: qty 200

## 2016-05-16 NOTE — Clinical Social Work Note (Signed)
Clinical Social Work Assessment  Patient Details  Name: Dustin Howell MRN: [id number] Date of Birth: 02-25-1925  Date of referral:  05/16/16               Reason for consult:  Facility Placement                Permission sought to share information with:  Facility Sport and exercise psychologist, Family Supports Permission granted to share information::  No (Patient is disoriented; completed assessment with patient's daughter)  Name::     [name]  Agency::  SNFs  Relationship::  Daughter  Contact Information:  [phone]  Housing/Transportation Living arrangements for the past 2 months:  Single Family Home Source of Information:  Adult Children Patient Interpreter Needed:  None Criminal Activity/Legal Involvement Pertinent to Current Situation/Hospitalization:  No - Comment as needed Significant Relationships:  Adult Children Lives with:  Self, Adult Children Do you feel safe going back to the place where you live?  No Need for family participation in patient care:  Yes (Comment)  Care giving concerns:  CSW received consult for possible SNF placement at time of discharge. Patient is disoriented. CSW spoke with patient's daughter regarding PT recommendation of SNF placement at time of discharge. Per patient's daughter, patient's family is currently unable to care for patient at their home given patient's current physical needs and fall risk. Patient's daughter expressed understanding of PT recommendation and is agreeable to SNF placement at time of discharge. CSW to continue to follow and assist with discharge planning needs.   Social Worker assessment / plan:  CSW spoke with patient's daughter concerning possibility of rehab at [facility] before returning home.  Employment status:  Retired Nurse, adult PT Recommendations:  [name] / Referral to community resources:  [place]  Patient/Family's Response to care:  Patient's  daughter recognizes need for rehab before returning home and is agreeable to a SNF in [place]. Patient reported preference for [facility].  Patient/Family's Understanding of and Emotional Response to Diagnosis, Current Treatment, and Prognosis:  Patient/family is realistic regarding therapy needs and expressed being hopeful for SNF placement. Patient's daughter expressed understanding of CSW role and discharge process. No questions/concerns about plan or treatment.    Emotional Assessment Appearance:  Appears stated age Attitude/Demeanor/Rapport:  Unable to Assess Affect (typically observed):  Unable to Assess Orientation:   (Disoriented) Alcohol / Substance use:  Not Applicable Psych involvement (Current and /or in the community):  No (Comment)  Discharge Needs  Concerns to be addressed:  Care Coordination Readmission within the last 30 days:  No Current discharge risk:  None Barriers to Discharge:  Continued Medical Work up   [employer], [place] 05/16/2016, 10:15 AM

## 2016-05-16 NOTE — Progress Notes (Signed)
[facility] Progress Note  Subjective:  Hgb down slightly this AM but no reports of bleeding.  Patient says that he feels good.  No complaints of abdominal pain.  Objective:  Vital signs in last 24 hours: Temp:  [97.6 F (36.4 C)-98.9 F (37.2 C)] 98.5 F (36.9 C) (11/04 0700) Pulse Rate:  [68-99] 72 (11/04 0700) Resp:  [15-32] 22 (11/04 0700) BP: (117-188)/(55-114) 130/57 (11/04 0700) SpO2:  [88 %-99 %] 94 % (11/04 0700) Weight:  [127 lb 13.9 oz (58 kg)] 127 lb 13.9 oz (58 kg) (11/04 0500) Last BM Date: 05/15/16 General:  Alert, elderly, frail, comfortable, in NAD. Heart:  Regular rate and rhythm Pulm:  CTAB anteriorly.   Abdomen:  Soft, non-distended.  BS present.  Non-tender. Extremities:  Without edema. Neurologic:  Alert and oriented to self and place;  grossly normal neurologically.  Rambling speech that is not necessarily on topic but cohesive.   Intake/Output from previous day: 11/03 0701 - 11/04 0700 In: [id number] [P.O.:180; I.V.:1200; IV Piggyback:350] Out: 700 [Urine:650; Stool:50]  Lab Results:  Recent Labs  05/14/16 1441 05/15/16 0132 05/16/16 0233  WBC 24.1* 16.8* 13.6*  HGB 10.7* 10.9* 9.3*  HCT 31.4* 31.5* 27.3*  PLT 71* 71* 70*   BMET  Recent Labs  05/15/16 0132 05/15/16 1044 05/16/16 0233  NA 143 143 144  K 2.5* 2.8* 2.8*  CL 113* 112* 112*  CO2 22 23 25   GLUCOSE 124* 110* 104*  BUN 22* 19 17  CREATININE 1.13 1.06 1.00  CALCIUM 8.0* 7.7* 7.8*   LFT  Recent Labs  05/14/16 0251  PROT 5.0*  ALBUMIN 2.7*  AST 66*  ALT 23  ALKPHOS 56  BILITOT 1.1   Dg Abd 1 View  Result Date: 05/14/2016 CLINICAL DATA:  Abdominal pain and gas pain. EXAM: ABDOMEN - 1 VIEW COMPARISON:  05/13/2016 FINDINGS: Again noted are gas-filled loops of bowel throughout the abdomen. The most dilated loops of bowel appear to represent colon. There is some small bowel gas distension. Limited evaluation for free air on this single supine image.  IMPRESSION: Gas-filled loops of bowel throughout the abdomen. Minimal change from the previous examination and findings may represent an ileus. Electronically Signed   By: Markus Daft M.D.   On: 05/14/2016 10:51   Assessment / Plan: *Lower GI bleed:  S/p 10/31 gelfoam embloization distal branch of middle colic artery (temporary).  Plan is for colonoscopy but he was unable to prep well and with no urgency to complete this as pt clinically stable/improved.  ? Plan to re-attempt prep on 11/5 for possible colonoscopy on 11/6.  Continue full liquid diet for now.  Lactic acid normal and WBC trending down.  Remains on Vanc and Zosyn.   *Colonic >>SB ileus. Clinically resolved.     *Blood loss anemia. S/p 6 units PRBCs, last were on 11/1.  Also had received 1 unit FFP and 1 unit of platelets.  Hgb down 1.5 grams this AM but no reports of bleeding.  *Hypokalemia, 2.8 this AM.  Needs replaced to optimize gut motility.  Will leave to primary team.  *Thrombocytopenia, stable. Suspect due to consumption.   *Confusion, agitation:  Seems to be somewhat better today.    LOS: 5 days   ZEHR, JESSICA D.  05/16/2016, 8:52 AM  Pager number [phone]  GI ATTENDING  Interval history data reviewed. Patient partially seen and examined. Family members in room. Agree with interval progress note. Daughter reports that confusion is somewhat  better but not baseline mental status. Patient has no complaints. Tolerating diet. Reports bowel movement without blood. Blood counts open stable. Discussed with him acute bleeding due to right colon lesion which was clearly vascular. Possible etiologies include diverticular bleed, to avoid, and AVM. Bleeding from neoplasia would be highly and characteristic. No mass seen on CT. It may be reasonable to treat him conservatively at this point and forego colonoscopy. No final decision however. We will continue to follow.  Docia Chuck. Geri [place]., M.D. [facility] Division of  Gastroenterology

## 2016-05-16 NOTE — NC FL2 (Signed)
[place] MEDICAID FL2 LEVEL OF CARE SCREENING TOOL     IDENTIFICATION  Patient Name: Dustin Howell Birthdate: [birth year] Sex: male Admission Date (Current Location): 05/11/2016  [facility] and [place] Number:  Herbalist and Address:  [facility], [place] [address] [place], [place] [address]      Provider Number: [id number]  Attending Physician Name and Address:  Bonnielee Haff, MD  Relative Name and Phone Number:  [name] Daughter [phone]     Current Level of Care: Hospital Recommended Level of Care: [place] Prior Approval Number:    Date Approved/Denied:   PASRR Number: [id number]  Discharge Plan: SNF    Current Diagnoses: Patient Active Problem List   Diagnosis Date Noted  . Acute encephalopathy   . Hypokalemia   . Ileus ([place])   . Abdominal pain   . Acute lower GI bleeding 05/11/2016  . Gastrointestinal hemorrhage 05/11/2016  . Acute blood loss anemia   . Arterial hypotension   . Acute pain of left knee   . Slurred speech     Orientation RESPIRATION BLADDER Height & Weight     Self  Other (Comment) (Irregular-Unlabored) Incontinent Weight: 127 lb 13.9 oz (58 kg) Height:  5\' 7"  (170.2 cm)  BEHAVIORAL SYMPTOMS/MOOD NEUROLOGICAL BOWEL NUTRITION STATUS      Incontinent Diet (Full Liquid)  AMBULATORY STATUS COMMUNICATION OF NEEDS Skin   Extensive Assist Verbally Normal                       Personal Care Assistance Level of Assistance  Bathing, Dressing Bathing Assistance: Maximum assistance   Dressing Assistance: Maximum assistance     Functional Limitations Info             SPECIAL CARE FACTORS FREQUENCY  PT (By licensed PT), OT (By licensed OT), Speech therapy     PT Frequency: 5X Wk OT Frequency: 5X Wk     Speech Therapy Frequency: 5X Wk      Contractures Contractures Info: Not present    Additional Factors Info  Code Status, Allergies Code Status Info: Full Allergies  Info: No known allergies           Current Medications (05/16/2016):  This is the current hospital active medication list Current Facility-Administered Medications  Medication Dose Route Frequency Provider Last Rate Last Dose  . 0.9 %  sodium chloride infusion   Intravenous Once Bonnielee Haff, MD      . alum & mag hydroxide-simeth (MAALOX/MYLANTA) 200-200-20 MG/5ML suspension 15 mL  15 mL Oral Q4H PRN Annita Brod, MD   15 mL at 05/13/16 1333  . dextrose 5 %-0.45 % sodium chloride infusion   Intravenous Continuous Bonnielee Haff, MD 50 mL/hr at 05/15/16 0700    . famotidine (PEPCID) tablet 20 mg  20 mg Oral BID Vena Rua, PA-C   20 mg at 05/15/16 2119  . LORazepam (ATIVAN) injection 0.5 mg  0.5 mg Intravenous Once Jeryl Columbia, NP      . metoprolol (LOPRESSOR) injection 2.5 mg  2.5 mg Intravenous Q6H Bonnielee Haff, MD   2.5 mg at 05/16/16 0521  . piperacillin-tazobactam (ZOSYN) IVPB 3.375 g  3.375 g Intravenous Q8H Honor Loh, RPH   3.375 g at 05/16/16 0521  . potassium chloride 30 mEq in sodium chloride 0.9 % 265 mL (KCL MULTIRUN) IVPB  30 mEq Intravenous Once Bonnielee Haff, MD   30 mEq at 05/16/16 1031  .  thiamine (B-1) injection 100 mg  100 mg Intravenous Daily Bonnielee Haff, MD   100 mg at 05/15/16 1311  . vancomycin (VANCOCIN) IVPB 750 mg/150 ml premix  750 mg Intravenous Q48H Honor Loh, RPH   750 mg at 05/15/16 [id number]     Discharge Medications: Please see discharge summary for a list of discharge medications.  Relevant Imaging Results:  Relevant Lab Results:   Additional Information    Christoher Drudge B, LCSWA

## 2016-05-16 NOTE — Progress Notes (Signed)
[facility] PROGRESS NOTE  Dustin Howell [id number] DOB: [birth year] DOA: 05/11/2016  PCP: [facility]  Brief History/Interval Summary: 82 year old Caucasian male with no significant past medical history and takes only a baby aspirin at home, presented to the emergency department at [facility] with complains of lower GI bleeding. Patient also had an episode of syncope. He was noted to be hypotensive. Patient was aggressively resuscitated with blood transfusions along with FFP and platelets. He underwent CT angiogram of the abdomen which showed a local area of bleeding. Patient was transferred over to [facility] and seen by interventional radiology and underwent embolization. Gastroenterology was consulted. Plan is for colonoscopy. However, patient has not been cooperative with his prep. In the meantime, patient also developed significant leukocytosis with elevated pro-calcitonin levels thought to be due to pneumonia. These are improving. He also became encephalopathic, which is also improving.  Reason for Visit: Lower GI bleed  Consultants: Critical care medicine. Gastroenterology. Interventional radiology.  Procedures: Mesenteric angiogram with Gelfoam embolization of the distal branch of the middle colic artery at the hepatic flexure.  Antibiotics: Vancomycin and Zosyn initiated on 11/1  Subjective/Interval History: Patient's mental status appears to be improving. Denies any pain.   ROS: Denies any nausea or vomiting.   Objective:  Vital Signs  Vitals:   05/16/16 0200 05/16/16 0300 05/16/16 0400 05/16/16 0500  BP: 130/71 (!) 128/55 (!) 117/56 131/60  Pulse: 76 73 68 71  Resp: (!) 22 (!) 32 19 (!) 25  Temp:    98.1 F (36.7 C)  TempSrc:    Axillary  SpO2: 95% 95% 95% 94%  Weight:    58 kg (127 lb 13.9 oz)  Height:        Intake/Output Summary (Last 24 hours) at 05/16/16 0823 Last data filed at 05/16/16 0700  Gross per 24 hour  Intake              1630 ml  Output              700 ml  Net              930 ml   Filed Weights   05/14/16 0417 05/15/16 0400 05/16/16 0500  Weight: 58.5 kg (128 lb 15.5 oz) 58.4 kg (128 lb 12 oz) 58 kg (127 lb 13.9 oz)    General appearance: Awake and alert. Appears to be less confused today.  Resp: Diminished air entry at the bases. No wheezing, rales or rhonchi. Cardio: regular rate and rhythm, S1, S2 normal, no murmur, click, rub or gallop GI: Abdomen is soft and less distended today. Nontender. No masses or organomegaly. Bowel sounds are present and appear to be normal.  Extremities: extremities normal, atraumatic, no cyanosis or edema Pulses: 2+ and symmetric Neurologic: Patient is awake and alert. He could tell me that he was in a hospital. He could tell me the year. Was somewhat confused about month. He couldn't tell me the name of President. No facial asymmetry. Tongue is midline. Moving all his extremities.  Lab Results:  Data Reviewed: I have personally reviewed following labs and imaging studies  CBC:  Recent Labs Lab 05/11/16 1741  05/13/16 1154 05/14/16 0251 05/14/16 1441 05/15/16 0132 05/16/16 0233  WBC 16.5*  < > 39.4* 22.7* 24.1* 16.8* 13.6*  NEUTROABS 5.6  --   --   --   --   --   --   HGB 7.8*  < > 7.6* 9.9* 10.7* 10.9* 9.3*  HCT 23.6*  < > 22.1* 28.6* 31.4* 31.5* 27.3*  MCV 93.5  < > 86.0 80.6 80.7 80.4 81.3  PLT 114*  < > 76* 61* 71* 71* 70*  < > = values in this interval not displayed.  Basic Metabolic Panel:  Recent Labs Lab 05/12/16 0355 05/12/16 1057 05/13/16 0222 05/13/16 1154 05/14/16 0251 05/15/16 0132 05/15/16 1044 05/15/16 1218 05/16/16 0233  NA 143  --  141 138 138 143 143  --  144  K 4.8  --  3.9 4.0 3.7 2.5* 2.8*  --  2.8*  CL 119*  --  117* 113* 113* 113* 112*  --  112*  CO2 20*  --  15* 16* 17* 22 23  --  25  GLUCOSE 141*  --  137* 181* 123* 124* 110*  --  104*  BUN 22*  --  38* 42* 35* 22* 19  --  17  CREATININE 1.21  --  1.95* 2.00* 1.39*  1.13 1.06  --  1.00  CALCIUM 7.4*  --  7.9* 7.8* 7.7* 8.0* 7.7*  --  7.8*  MG 1.6*  --  2.3  --   --   --   --  1.9  --   PHOS 4.2 5.1*  --   --   --   --   --   --   --     GFR: Estimated Creatinine Clearance: 41.1 mL/min (by C-G formula based on SCr of 1 mg/dL).  Liver Function Tests:  Recent Labs Lab 05/11/16 1741 05/14/16 0251  AST 22 66*  ALT 9* 23  ALKPHOS 48 56  BILITOT 0.5 1.1  PROT 5.1* 5.0*  ALBUMIN 3.2* 2.7*     Recent Labs Lab 05/11/16 1741  LIPASE 37   Coagulation Profile:  Recent Labs Lab 05/11/16 1741  INR 1.25    Cardiac Enzymes:  Recent Labs Lab 05/11/16 1741 05/12/16 0355 05/12/16 1057 05/12/16 1607 05/12/16 2238  TROPONINI <0.03 0.06* 0.05* 0.06* 0.11*    CBG:  Recent Labs Lab 05/11/16 2244  GLUCAP 143*     Recent Results (from the past 240 hour(s))  MRSA PCR Screening     Status: None   Collection Time: 05/11/16 10:38 PM  Result Value Ref Range Status   MRSA by PCR NEGATIVE NEGATIVE Final    Comment:        The GeneXpert MRSA Assay (FDA approved for NASAL specimens only), is one component of a comprehensive MRSA colonization surveillance program. It is not intended to diagnose MRSA infection nor to guide or monitor treatment for MRSA infections.   Culture, blood (routine x 2)     Status: None (Preliminary result)   Collection Time: 05/13/16  6:15 AM  Result Value Ref Range Status   Specimen Description BLOOD RIGHT HAND  Final   Special Requests BOTTLES DRAWN AEROBIC ONLY 5.0CC  Final   Culture NO GROWTH 2 DAYS  Final   Report Status PENDING  Incomplete  Culture, blood (routine x 2)     Status: None (Preliminary result)   Collection Time: 05/13/16  6:25 AM  Result Value Ref Range Status   Specimen Description BLOOD LEFT HAND  Final   Special Requests BOTTLES DRAWN AEROBIC AND ANAEROBIC 5 CC EACH  Final   Culture NO GROWTH 2 DAYS  Final   Report Status PENDING  Incomplete      Radiology Studies: Dg Abd 1  View  Result Date: 05/14/2016 CLINICAL DATA:  Abdominal pain and gas  pain. EXAM: ABDOMEN - 1 VIEW COMPARISON:  05/13/2016 FINDINGS: Again noted are gas-filled loops of bowel throughout the abdomen. The most dilated loops of bowel appear to represent colon. There is some small bowel gas distension. Limited evaluation for free air on this single supine image. IMPRESSION: Gas-filled loops of bowel throughout the abdomen. Minimal change from the previous examination and findings may represent an ileus. Electronically Signed   By: Markus Daft M.D.   On: 05/14/2016 10:51     Medications:  Scheduled: . sodium chloride   Intravenous Once  . famotidine  20 mg Oral BID  . LORazepam  0.5 mg Intravenous Once  . metoprolol  2.5 mg Intravenous Q6H  . piperacillin-tazobactam (ZOSYN)  IV  3.375 g Intravenous Q8H  . potassium chloride (KCL MULTIRUN) 30 mEq in 265 mL IVPB  30 mEq Intravenous Once  . thiamine injection  100 mg Intravenous Daily  . vancomycin  750 mg Intravenous Q48H   Continuous: . dextrose 5 % and 0.45% NaCl 50 mL/hr at 05/15/16 0700   PRN:  Assessment/Plan:  Active Problems:   Acute lower GI bleeding   Gastrointestinal hemorrhage   Acute blood loss anemia   Arterial hypotension   Acute pain of left knee   Slurred speech   Abdominal pain   Ileus (HCC)   Acute encephalopathy   Hypokalemia    Lower GI bleed. Patient is status post embolization of the middle colic artery. This was done on 10/31 by interventional radiology. Has not been noted to have any further bloody stools. Gastroenterology is following. Colonoscopy continues to remain on hold due to inability of the patient take his prep at this time. Could be reattempted in the next day or so.  Abdominal distention/Ileus Clinically, appears to have improved. Continue to monitor.   Acute encephalopathy. Patient's mental status has improved. Patient did get a dose of Haldol yesterday. Continue to reorient daily. He does not  have any focal neurological deficits. Acute encephalopathy was most likely due to hospital stay and acute illness. There has been no history of falls or trauma. B-12 was 350. TSH 4.13.  Acute blood loss anemia secondary to GI bleed. Hemoglobin slightly lower today compared to yesterday. Monitor for now. Hemoglobin had responded appropriately to blood transfusion. Last transfused on 11/1. In total, he has received 6 units of PRBC, 1 unit of FFP and 1 unit of platelets so far.   Leukocytosis with questionable infiltrate on chest x-ray Patient had a significant rise in his WBC. No obvious focus of infection is identified except for an abnormality seen on chest x-ray. His lactic acid was noted to be elevated and his pro-calcitonin was also significantly elevated. Blood cultures were obtained and are negative so far. UA did not show any evidence for UTI. He was given fluid boluses. Lactic acid level has improved. He was started on vancomycin and Zosyn. Pro-calcitonin level is also improved. WBC is improving. Okay for transition to Unasyn for now.  Questionable pneumonia, possible aspiration. See above. Seen by speech therapy. Some concern was about possible aspiration. Which could have been due to his mental status. For now he is on full liquid diet. SLP to reevaluate. Change antibiotics as mentioned above.  Hypotension. Patient was initially hypotensive. Secondary to acute GI hemorrhage. Blood pressures have stabilized and now in the hypertensive range, likely due to his agitation. Continue metoprolol intravenously for now. Hydralazine as needed. Patient also noted to have sinus tachycardia, which is also likely secondary to his  agitation. Blood pressure and heart rate have improved. Continue to monitor.  Acute renal failure with non-anion gap metabolic acidosis Acute renal failure is most likely secondary to hypovolemia. Patient also received contrast for his CT scan. Monitor urine upper closely. Renal  function has improved and now back to baseline. Continue IV fluids at a lower rate. Due to poor oral intake. Bicarbonate level is also improving. No renal abnormalities noted on CT scan except for bilateral renal cysts.  Hypokalemia Potassium to be repleted intravenously. Magnesium was 1.9. Repeat labs later today.  Thrombocytopenia Counts are low but stable for the most part. Continue to monitor platelet counts closely. Avoid heparin products. Etiology remains unclear.   Questionable slurred speech at the time of initial assessment. Speech noted to be normal now. Patient does not have any neurological deficits. Continue to monitor.  Left knee pain. X-ray did not show any acute process. Pain appears to have resolved.  DVT Prophylaxis: SCDs    Code Status: Full code  Family Communication: No family at bedside  Disposition Plan: Patient appears to be slowly improving. Continue management as outlined above. Continue stepdown status.     LOS: 5 days   [facility] Pager [phone] 05/16/2016, 8:23 AM  If 7PM-7AM, please contact night-coverage at [email or web address], password [facility]

## 2016-05-16 NOTE — Progress Notes (Addendum)
Pharmacy Antibiotic Note  Dustin Howell is a 82 y.o. male admitted on 05/11/2016 with GIB and suspected sepsis. Pharmacy has been consulted for Unasyn dosing  Plan: Unasyn 3 grams iv Q 8 hours Pharmacy to sign off and follow peripherally for renal changes  Height: 5\' 7"  (170.2 cm) Weight: 127 lb 13.9 oz (58 kg) IBW/kg (Calculated) : 66.1  Temp (24hrs), Avg:98.4 F (36.9 C), Min:97.6 F (36.4 C), Max:98.9 F (37.2 C)   Recent Labs Lab 05/13/16 0616 05/13/16 0932 05/13/16 1154 05/14/16 0251 05/14/16 0940 05/14/16 1441 05/15/16 0132 05/15/16 1044 05/16/16 0233  WBC  --   --  39.4* 22.7*  --  24.1* 16.8*  --  13.6*  CREATININE  --   --  2.00* 1.39*  --   --  1.13 1.06 1.00  LATICACIDVEN 3.0* 2.5*  --   --  1.7  --   --   --   --     Estimated Creatinine Clearance: 41.1 mL/min (by C-G formula based on SCr of 1 mg/dL).    No Known Allergies   Antimicrobials this admission:  11/1 Zosyn >>11/4 11/1 Vanc >> 11/4  Dose adjustments this admission: N/A  Microbiology results:  11/1 BCx: NGTD 10/30 MRSA PCR: negative  Thank you Anette Guarneri, PharmD [phone] 05/16/2016, 11:43 AM

## 2016-05-17 DIAGNOSIS — R109 Unspecified abdominal pain: Secondary | ICD-10-CM

## 2016-05-17 DIAGNOSIS — R935 Abnormal findings on diagnostic imaging of other abdominal regions, including retroperitoneum: Secondary | ICD-10-CM

## 2016-05-17 LAB — BASIC METABOLIC PANEL
ANION GAP: 5 (ref 5–15)
BUN: 14 mg/dL (ref 6–20)
CO2: 25 mmol/L (ref 22–32)
Calcium: 7.6 mg/dL — ABNORMAL LOW (ref 8.9–10.3)
Chloride: 114 mmol/L — ABNORMAL HIGH (ref 101–111)
Creatinine, Ser: 0.9 mg/dL (ref 0.61–1.24)
GFR calc Af Amer: >60 mL/min (ref >60)
GFR calc non Af Amer: >60 mL/min (ref >60)
GLUCOSE: 102 mg/dL — AB (ref 65–99)
POTASSIUM: 3.6 mmol/L (ref 3.5–5.1)
Sodium: 144 mmol/L (ref 135–145)

## 2016-05-17 LAB — CBC
HCT: 26.3 % — ABNORMAL LOW (ref 39.0–52.0)
HEMATOCRIT: 28.1 % — AB (ref 39.0–52.0)
HEMOGLOBIN: 8.8 g/dL — AB (ref 13.0–17.0)
HEMOGLOBIN: 9.2 g/dL — AB (ref 13.0–17.0)
MCH: 27.7 pg (ref 26.0–34.0)
MCH: 27.5 pg (ref 26.0–34.0)
MCHC: 33.5 g/dL (ref 30.0–36.0)
MCHC: 32.7 g/dL (ref 30.0–36.0)
MCV: 82.7 fL (ref 78.0–100.0)
MCV: 84.1 fL (ref 78.0–100.0)
Platelets: 75 10*3/uL — ABNORMAL LOW (ref 150–400)
Platelets: 77 10*3/uL — ABNORMAL LOW (ref 150–400)
RBC: 3.18 MIL/uL — AB (ref 4.22–5.81)
RBC: 3.34 MIL/uL — ABNORMAL LOW (ref 4.22–5.81)
RDW: 19.3 % — ABNORMAL HIGH (ref 11.5–15.5)
RDW: 19.4 % — AB (ref 11.5–15.5)
WBC: 10.5 10*3/uL (ref 4.0–10.5)
WBC: 11.7 10*3/uL — ABNORMAL HIGH (ref 4.0–10.5)

## 2016-05-17 MED ORDER — POTASSIUM CHLORIDE CRYS ER 20 MEQ PO TBCR
20.0000 meq | EXTENDED_RELEASE_TABLET | Freq: Once | ORAL | Status: AC
  Administered 2016-05-17: 20 meq via ORAL
  Filled 2016-05-17: qty 1

## 2016-05-17 MED ORDER — METOPROLOL TARTRATE 12.5 MG HALF TABLET
12.5000 mg | ORAL_TABLET | Freq: Two times a day (BID) | ORAL | Status: DC
  Administered 2016-05-17 – 2016-05-18 (×3): 12.5 mg via ORAL
  Filled 2016-05-17 (×3): qty 1

## 2016-05-17 MED ORDER — VITAMIN B-1 100 MG PO TABS
100.0000 mg | ORAL_TABLET | Freq: Every day | ORAL | Status: DC
  Administered 2016-05-18 – 2016-05-19 (×2): 100 mg via ORAL
  Filled 2016-05-17 (×2): qty 1

## 2016-05-17 NOTE — Progress Notes (Signed)
Attempted to call report.  RN is currently busy and per Network engineer, RN will call me back.

## 2016-05-17 NOTE — Progress Notes (Signed)
[facility] PROGRESS NOTE  Dustin Howell ZV:[id number] DOB: 17-Oct-1924 DOA: 05/11/2016  PCP: [facility]  Brief History/Interval Summary: 82 year old Caucasian male with no significant past medical history and takes only a baby aspirin at home, presented to the emergency department at [facility] with complains of lower GI bleeding. Patient also had an episode of syncope. He was noted to be hypotensive. Patient was aggressively resuscitated with blood transfusions along with FFP and platelets. He underwent CT angiogram of the abdomen which showed a local area of bleeding. Patient was transferred over to [facility] and seen by interventional radiology and underwent embolization. Gastroenterology was consulted. Plan is for colonoscopy. However, patient has not been cooperative with his prep. In the meantime, patient also developed significant leukocytosis with elevated pro-calcitonin levels thought to be due to pneumonia. These are improving. He also became encephalopathic, which is also improving.  Reason for Visit: Lower GI bleed  Consultants: Critical care medicine. Gastroenterology. Interventional radiology.  Procedures: Mesenteric angiogram with Gelfoam embolization of the distal branch of the middle colic artery at the hepatic flexure.  Antibiotics: Vancomycin and Zosyn initiated on 11/1. Stopped on 11/4. Unasyn started on 11/4  Subjective/Interval History: Patient much more awake, alert, but still remains distracted at times. Denies any pain. Denies any nausea or vomiting.   ROS: Denies any nausea or vomiting.   Objective:  Vital Signs  Vitals:   05/16/16 2044 05/17/16 0000 05/17/16 0300 05/17/16 0433  BP: (!) 154/69 (!) 157/67 134/66   Pulse: 83 73 63   Resp: 19 (!) 25 15   Temp: 98.4 F (36.9 C) 99.1 F (37.3 C) 98.5 F (36.9 C)   TempSrc: Oral Oral Axillary   SpO2: 90% 90% 93%   Weight:    61.8 kg (136 lb 3.9 oz)  Height:         Intake/Output Summary (Last 24 hours) at 05/17/16 0743 Last data filed at 05/17/16 0600  Gross per 24 hour  Intake             2240 ml  Output             1225 ml  Net             1015 ml   Filed Weights   05/15/16 0400 05/16/16 0500 05/17/16 0433  Weight: 58.4 kg (128 lb 12 oz) 58 kg (127 lb 13.9 oz) 61.8 kg (136 lb 3.9 oz)    General appearance: Awake and alert. Less confused. Still slightly distracted. Following commands. Resp: Slightly improved air entry bilaterally. No wheezing, rales or rhonchi. Cardio: regular rate and rhythm, S1, S2 normal, no murmur, click, rub or gallop GI: Abdomen is soft. Nontender. No masses or organomegaly. Bowel sounds are present and appear to be normal.  Extremities: extremities normal, atraumatic, no cyanosis or edema Pulses: 2+ and symmetric Neurologic: Patient is awake and alert. He could tell me that he was in a hospital. He could tell me the year. He thought it was October. He could tell me the name of the president today. No facial asymmetry. Tongue is midline. Moving all his extremities.  Lab Results:  Data Reviewed: I have personally reviewed following labs and imaging studies  CBC:  Recent Labs Lab 05/11/16 1741  05/14/16 0251 05/14/16 1441 05/15/16 0132 05/16/16 0233 05/17/16 0151  WBC 16.5*  < > 22.7* 24.1* 16.8* 13.6* 10.5  NEUTROABS 5.6  --   --   --   --   --   --  HGB 7.8*  < > 9.9* 10.7* 10.9* 9.3* 8.8*  HCT 23.6*  < > 28.6* 31.4* 31.5* 27.3* 26.3*  MCV 93.5  < > 80.6 80.7 80.4 81.3 82.7  PLT 114*  < > 61* 71* 71* 70* 75*  < > = values in this interval not displayed.  Basic Metabolic Panel:  Recent Labs Lab 05/12/16 0355 05/12/16 1057 05/13/16 0222  05/15/16 0132 05/15/16 1044 05/15/16 1218 05/16/16 0233 05/16/16 1616 05/17/16 0151  NA 143  --  141  < > 143 143  --  144 144 144  K 4.8  --  3.9  < > 2.5* 2.8*  --  2.8* 3.7 3.6  CL 119*  --  117*  < > 113* 112*  --  112* 113* 114*  CO2 20*  --  15*  < >  22 23  --  25 25 25   GLUCOSE 141*  --  137*  < > 124* 110*  --  104* 120* 102*  BUN 22*  --  38*  < > 22* 19  --  17 15 14   CREATININE 1.21  --  1.95*  < > 1.13 1.06  --  1.00 0.99 0.90  CALCIUM 7.4*  --  7.9*  < > 8.0* 7.7*  --  7.8* 8.1* 7.6*  MG 1.6*  --  2.3  --   --   --  1.9  --   --   --   PHOS 4.2 5.1*  --   --   --   --   --   --   --   --   < > = values in this interval not displayed.  GFR: Estimated Creatinine Clearance: 48.6 mL/min (by C-G formula based on SCr of 0.9 mg/dL).  Liver Function Tests:  Recent Labs Lab 05/11/16 1741 05/14/16 0251  AST 22 66*  ALT 9* 23  ALKPHOS 48 56  BILITOT 0.5 1.1  PROT 5.1* 5.0*  ALBUMIN 3.2* 2.7*     Recent Labs Lab 05/11/16 1741  LIPASE 37   Coagulation Profile:  Recent Labs Lab 05/11/16 1741  INR 1.25    Cardiac Enzymes:  Recent Labs Lab 05/11/16 1741 05/12/16 0355 05/12/16 1057 05/12/16 1607 05/12/16 2238  TROPONINI <0.03 0.06* 0.05* 0.06* 0.11*    CBG:  Recent Labs Lab 05/11/16 2244  GLUCAP 143*     Recent Results (from the past 240 hour(s))  MRSA PCR Screening     Status: None   Collection Time: 05/11/16 10:38 PM  Result Value Ref Range Status   MRSA by PCR NEGATIVE NEGATIVE Final    Comment:        The GeneXpert MRSA Assay (FDA approved for NASAL specimens only), is one component of a comprehensive MRSA colonization surveillance program. It is not intended to diagnose MRSA infection nor to guide or monitor treatment for MRSA infections.   Culture, blood (routine x 2)     Status: None (Preliminary result)   Collection Time: 05/13/16  6:15 AM  Result Value Ref Range Status   Specimen Description BLOOD RIGHT HAND  Final   Special Requests BOTTLES DRAWN AEROBIC ONLY 5.0CC  Final   Culture NO GROWTH 3 DAYS  Final   Report Status PENDING  Incomplete  Culture, blood (routine x 2)     Status: None (Preliminary result)   Collection Time: 05/13/16  6:25 AM  Result Value Ref Range Status    Specimen Description BLOOD LEFT HAND  Final  Special Requests BOTTLES DRAWN AEROBIC AND ANAEROBIC 5 CC EACH  Final   Culture NO GROWTH 3 DAYS  Final   Report Status PENDING  Incomplete      Radiology Studies: No results found.   Medications:  Scheduled: . ampicillin-sulbactam (UNASYN) IV  3 g Intravenous Q8H  . famotidine  20 mg Oral BID  . LORazepam  0.5 mg Intravenous Once  . metoprolol tartrate  12.5 mg Oral BID  . potassium chloride  20 mEq Oral Once  . thiamine injection  100 mg Intravenous Daily   Continuous: . dextrose 5 % and 0.45% NaCl 50 mL/hr at 05/15/16 0700   PRN:  Assessment/Plan:  Active Problems:   Acute lower GI bleeding   Gastrointestinal hemorrhage   Acute blood loss anemia   Arterial hypotension   Acute pain of left knee   Slurred speech   Abdominal pain   Ileus (HCC)   Acute encephalopathy   Hypokalemia    Lower GI bleed. Patient is status post embolization of the middle colic artery. This was done on 10/31 by interventional radiology. Has not been noted to have any further bloody stools. Gastroenterology is following. Colonoscopy continues to remain on hold due to inability of the patient take his prep at this time. Gastroenterology to address. Hemoglobin has been trending down over the last 2 days. He hasn't had any rectal bleeding per nursing staff.  Abdominal distention/Ileus Clinically, appears to have improved. Continue to monitor.   Acute encephalopathy. Patient's mental status has improved. Continue to reorient daily. He does not have any focal neurological deficits. Acute encephalopathy was most likely due to hospital stay and acute illness. There has been no history of falls or trauma. B-12 was 350. TSH 4.13.  Acute blood loss anemia secondary to GI bleed. Hemoglobin is slowly trending down. However, there has been no overt rectal bleeding. Will be repeated later today. Monitor for now. Hemoglobin had responded appropriately to blood  transfusion. Last transfused on 11/1. In total, he has received 6 units of PRBC, 1 unit of FFP and 1 unit of platelets so far.   Leukocytosis with questionable infiltrate on chest x-ray Patient had a significant rise in his WBC. No obvious focus of infection is identified except for perhaps aspiration pneumonia. His lactic acid was noted to be elevated and his pro-calcitonin was also significantly elevated. Blood cultures were obtained and are negative. UA did not show any evidence for UTI. He was given fluid boluses. Lactic acid level has improved. He was started on vancomycin and Zosyn. Pro-calcitonin level is also improved. WBC is now normal. Transitioned to Unasyn. As his oral intake improves, he will be changed over to Augmentin eventually.   Questionable pneumonia, possible aspiration. See above. Seen by speech therapy. Some concern was about possible aspiration. Which could have been due to his mental status. For now he is on full liquid diet. SLP to reevaluate. NT new Unasyn for now.  Hypertension Patient was initially hypotensive. Secondary to acute GI hemorrhage. Blood pressures have stabilized and now in the hypertensive range, likely due to his agitation. Continue metoprolol intravenously for now. Hydralazine as needed. Patient also noted to have sinus tachycardia, which is also likely secondary to his agitation. Blood pressure and heart rate have improved. Continue to monitor.  Acute renal failure with non-anion gap metabolic acidosis Acute renal failure is most likely secondary to hypovolemia. Patient also received contrast for his CT scan. Monitor urine upper closely. Renal function has improved and now  back to baseline. Saline lock IV. Bicarbonate level is also improving. No renal abnormalities noted on CT scan except for bilateral renal cysts.  Hypokalemia Potassium level has improved. Continue to monitor periodically.  Thrombocytopenia Counts are low but stable for the most part.  Continue to monitor platelet counts closely. Avoid heparin products. Etiology remains unclear.   Questionable slurred speech at the time of initial assessment. Speech noted to be normal now. Patient does not have any neurological deficits. Continue to monitor. PT evaluation and should be ongoing.  Left knee pain. X-ray did not show any acute process. Pain appears to have resolved.  DVT Prophylaxis: SCDs    Code Status: Full code  Family Communication: No family at bedside  Disposition Plan: Patient appears to be slowly improving. Continue management as outlined above. Okay for transfer to medical floor. Await further GI input regarding colonoscopy.     LOS: 6 days   [facility] Pager [phone] 05/17/2016, 7:43 AM  If 7PM-7AM, please contact night-coverage at [email or web address], password [facility]

## 2016-05-17 NOTE — Progress Notes (Signed)
     [facility] Progress Note  Subjective:  Patient alone in room today.  Seems to be slightly less confused and closer to baseline each day.  Hgb down another 0.5 grams this AM but no reports of bleeding.  Objective:  Vital signs in last 24 hours: Temp:  [98.4 F (36.9 C)-99.1 F (37.3 C)] 98.4 F (36.9 C) (11/05 0800) Pulse Rate:  [63-86] 74 (11/05 0800) Resp:  [15-25] 18 (11/05 0800) BP: (134-171)/(66-80) 171/80 (11/05 0800) SpO2:  [90 %-96 %] 91 % (11/05 0800) Weight:  [136 lb 3.9 oz (61.8 kg)] 136 lb 3.9 oz (61.8 kg) (11/05 0433) Last BM Date: 05/16/16 General:  Alert, elderly and frail but comfortable and in NAD Heart:  Regular rate and rhythm Pulm:  Some mild wheezing noted anteriorly. Abdomen:  Soft, non-distended.  BS present.  Non-tender.  Extremities:  Without edema.  Intake/Output from previous day: 11/04 0701 - 11/05 0700 In: 2290 [P.O.:240; I.V.:1750; IV Piggyback:300] Out: 1225 [Urine:1225] Intake/Output this shift: Total I/O In: 50 [I.V.:50] Out: -   Lab Results:  Recent Labs  05/15/16 0132 05/16/16 0233 05/17/16 0151  WBC 16.8* 13.6* 10.5  HGB 10.9* 9.3* 8.8*  HCT 31.5* 27.3* 26.3*  PLT 71* 70* 75*   BMET  Recent Labs  05/16/16 0233 05/16/16 1616 05/17/16 0151  NA 144 144 144  K 2.8* 3.7 3.6  CL 112* 113* 114*  CO2 25 25 25   GLUCOSE 104* 120* 102*  BUN 17 15 14   CREATININE 1.00 0.99 0.90  CALCIUM 7.8* 8.1* 7.6*   Assessment / Plan: *Lower GI bleed:  S/p 10/31 gelfoam embloization distal branch of middle colic artery (temporary).  Plan is for colonoscopy but he was unable to prep well and with no urgency to complete this as pt clinically stable/improved.  Continue full liquid diet for now.  Lactic acid normal and WBC trending down.  Remains on Vanc and Zosyn.  ? Need to even perform colonoscopy at this point.  Bleeding was likely diverticular, no mass noted on CT scan.   *Colonic >>SB ileus. Clinically resolved.      *Blood loss anemia. S/p 6 units PRBCs, last were on 11/1.  Also had received 1 unit FFP and 1 unit of platelets.  Hgb down another 0.5 grams this AM but still no reports of bleeding.  Monitor.    *Hypokalemia, improved at 3.6 this AM.  *Thrombocytopenia, stable. Suspect due to consumption.   *Confusion, agitation:  Seems to be getting slightly better each day.  -Will discuss with GI team in the AM regarding necessity of colonoscopy.   LOS: 6 days   ZEHR, JESSICA D.  05/17/2016, 10:02 AM  Pager number [phone]  GI ATTENDING  Interval history data reviewed. Patient seen and examined. Agree with interval progress note. He remains quickly stable. Mental status seems better. Not sure if it is critical that we perform colonoscopy at this point. Will discuss with GI colleagues and offer firm decision tomorrow. Any event, he still requires in-hospital observation at this point. Family aware.  Docia Chuck. Geri Seminole., M.D. [facility] of Gastroenterology

## 2016-05-17 NOTE — Progress Notes (Signed)
Spoke with [name], pt's daughter, and notify her of the transfer.    Report given to [facility], Financial planner.

## 2016-05-18 DIAGNOSIS — J690 Pneumonitis due to inhalation of food and vomit: Secondary | ICD-10-CM

## 2016-05-18 LAB — BASIC METABOLIC PANEL
Anion gap: 7 (ref 5–15)
BUN: 10 mg/dL (ref 6–20)
CHLORIDE: 110 mmol/L (ref 101–111)
CO2: 25 mmol/L (ref 22–32)
CREATININE: 0.93 mg/dL (ref 0.61–1.24)
Calcium: 7.5 mg/dL — ABNORMAL LOW (ref 8.9–10.3)
GFR calc Af Amer: >60 mL/min (ref >60)
Glucose, Bld: 81 mg/dL (ref 65–99)
Potassium: 3.7 mmol/L (ref 3.5–5.1)
SODIUM: 142 mmol/L (ref 135–145)

## 2016-05-18 LAB — CULTURE, BLOOD (ROUTINE X 2)
CULTURE: NO GROWTH
Culture: NO GROWTH

## 2016-05-18 LAB — CBC
HCT: 27.2 % — ABNORMAL LOW (ref 39.0–52.0)
Hemoglobin: 8.8 g/dL — ABNORMAL LOW (ref 13.0–17.0)
MCH: 27.6 pg (ref 26.0–34.0)
MCHC: 32.4 g/dL (ref 30.0–36.0)
MCV: 85.3 fL (ref 78.0–100.0)
PLATELETS: 76 10*3/uL — AB (ref 150–400)
RBC: 3.19 MIL/uL — ABNORMAL LOW (ref 4.22–5.81)
RDW: 19.3 % — ABNORMAL HIGH (ref 11.5–15.5)
WBC: 8.7 10*3/uL (ref 4.0–10.5)

## 2016-05-18 MED ORDER — AMLODIPINE BESYLATE 5 MG PO TABS
5.0000 mg | ORAL_TABLET | Freq: Every day | ORAL | Status: DC
  Administered 2016-05-18 – 2016-05-19 (×2): 5 mg via ORAL
  Filled 2016-05-18 (×2): qty 1

## 2016-05-18 MED ORDER — AMOXICILLIN-POT CLAVULANATE 875-125 MG PO TABS
1.0000 | ORAL_TABLET | Freq: Two times a day (BID) | ORAL | Status: DC
  Administered 2016-05-18 – 2016-05-19 (×2): 1 via ORAL
  Filled 2016-05-18 (×2): qty 1

## 2016-05-18 MED ORDER — RESOURCE THICKENUP CLEAR PO POWD
ORAL | Status: DC | PRN
  Filled 2016-05-18: qty 125

## 2016-05-18 MED ORDER — METOPROLOL TARTRATE 25 MG PO TABS
25.0000 mg | ORAL_TABLET | Freq: Two times a day (BID) | ORAL | Status: DC
Start: 2016-05-18 — End: 2016-05-19
  Administered 2016-05-18 – 2016-05-19 (×2): 25 mg via ORAL
  Filled 2016-05-18 (×2): qty 1

## 2016-05-18 NOTE — Progress Notes (Addendum)
Pt asleep, seems to be gasping for air, SpO2 94%, pt denied SOB, and discomfort, lung sounds clear. Respiratory consulted, came to assess pt, no concerns at this time. Will continue to monitor.

## 2016-05-18 NOTE — Progress Notes (Signed)
Daily Rounding Note  05/18/2016, 8:27 AM  LOS: 7 days   SUBJECTIVE:   Chief complaint: denies abdominal pain.      Staff reports no further bleeding PR, bloody stools.  Tolerating fulls but not eating much as it is not to his liking. Prefers robust meals: ham and eggs.  SLP is now in room to perform BS swallow eval.  Flexiseal rectal catheter has been removed.   OBJECTIVE:         Vital signs in last 24 hours:    Temp:  [98.3 F (36.8 C)-99.6 F (37.6 C)] 99.6 F (37.6 C) (11/06 0700) Pulse Rate:  [74-79] 79 (11/06 0700) Resp:  [18-24] 20 (11/06 0700) BP: (149-178)/(65-68) 176/66 (11/06 0700) SpO2:  [91 %-97 %] 97 % (11/06 0700) Weight:  [62.3 kg (137 lb 5.6 oz)] 62.3 kg (137 lb 5.6 oz) (11/06 0333) Last BM Date: 05/17/16 Filed Weights   05/16/16 0500 05/17/16 0433 05/18/16 0333  Weight: 58 kg (127 lb 13.9 oz) 61.8 kg (136 lb 3.9 oz) 62.3 kg (137 lb 5.6 oz)   General: a lot more alert and not confused.  Aged and frail but not ill looking.  Edentulous.   Heart: RRR Chest: clear bil.  No cough or dyspnea Abdomen: soft, BS active, NT, ND.   condom cath in place Extremities: no CCE Neuro/Psych:  Oriented to 2017, [place], hospital (not to specific hospital name) and self.  Appropriated.   HOH.    Intake/Output from previous day: 11/05 0701 - 11/06 0700 In: 450 [I.V.:150; IV Piggyback:300] Out: 900 [Urine:900]  Intake/Output this shift: Total I/O In: -  Out: 850 [Urine:850]  Lab Results:  Recent Labs  05/17/16 0151 05/17/16 1336 05/18/16 0600  WBC 10.5 11.7* 8.7  HGB 8.8* 9.2* 8.8*  HCT 26.3* 28.1* 27.2*  PLT 75* 77* 76*   BMET  Recent Labs  05/16/16 1616 05/17/16 0151 05/18/16 0600  NA 144 144 142  K 3.7 3.6 3.7  CL 113* 114* 110  CO2 25 25 25   GLUCOSE 120* 102* 81  BUN 15 14 10   CREATININE 0.99 0.90 0.93  CALCIUM 8.1* 7.6* 7.5*   LFT No results for input(s): PROT, ALBUMIN, AST, ALT, ALKPHOS,  BILITOT, BILIDIR, IBILI in the last 72 hours. PT/INR No results for input(s): LABPROT, INR in the last 72 hours. Hepatitis Panel No results for input(s): HEPBSAG, HCVAB, HEPAIGM, HEPBIGM in the last 72 hours.  Studies/Results: No results found.   Scheduled Meds: . ampicillin-sulbactam (UNASYN) IV  3 g Intravenous Q8H  . famotidine  20 mg Oral BID  . metoprolol tartrate  12.5 mg Oral BID  . thiamine  100 mg Oral Daily   Continuous Infusions: PRN Meds:.alum & mag hydroxide-simeth   ASSESMENT:   *  Acute hematochezia and major bleed.  S/p gelfoam embolization of distal branch middle colic aretery.  CT scan 10/30: scattered sigmoid tics, active bleeding at hepatic flexure.  No colonic masses.  Clinically improved.  Due to confusion, unable to prep for colonoscopy.    *  Blood loss anemia.  S/p multiple PRBCs.  Hgb low but stable.   *  Confusion, encephalopthy.  Much improved  *  Leukocytosis, elevated pro-calcitonin. Mild left atx vs infiltrate on cxr 11/1.  Day 3 Unasyn, previous 3.5 days Zosyn, Vanc.    PLAN   *  ? Ultimate need for colonoscopy in 82 y/o with likely diverticular bleed and no colonic mass on  CT.   Colonoscopy could prove/disprove presence of cancer or polyps.  Additionally: pt not in need of antiplatelet or AC meds in past/future.  At present pt has not strong opinion for or against colonoscopy.  Spoke to his Dtr.  [name] [phone] and d/w her, she plans to visit with her Dad and discuss proceeding with or abandoning Colonoscopy plans today.  She is aware that GI available to discuss further. [name]  05/18/2016, 8:27 AM Pager: [phone]  ________________________________________________________________________  Velora Heckler GI MD note:  I personally examined the patient, reviewed the data and agree with the assessment and plan described above. Ultimately the decision of having a colonoscopy is up to him/his family.  He is 82 and frail but I  think he would probably  tolerate a prep/colonoscopy well.  The utility would be to prove/disprove underlying cancer and also to check for any other obvious etiology of his massive lower GI bleed recently.  This is the first time I have met him but he doesn't seem too determined to go ahead with a colonoscopy.  The alternative of simply observing him clinically is very reasonable and if he were my family member that is what I would advise.    Owens Loffler, MD [facility] Pager [phone]

## 2016-05-18 NOTE — Progress Notes (Signed)
Pt is refusing cardiac monitoring. BP is 169/69. Will give scheduled Lopressor.Will recheck. MD notified. Will continue to monitor.

## 2016-05-18 NOTE — Progress Notes (Signed)
Pt BP elevated, Doctor [place] Pink paged, instruction given to administer metoprolol scheduled at 1000 earlier. Message passed to day shift nurse.

## 2016-05-18 NOTE — Progress Notes (Signed)
[facility] PROGRESS NOTE  Dustin Howell [id number] DOB: 05-12-1925 DOA: 05/11/2016  PCP: [facility]  Brief History/Interval Summary: 82 year old Caucasian male with no significant past medical history and takes only a baby aspirin at home, presented to the emergency department at [facility] with complains of lower GI bleeding. Patient also had an episode of syncope. He was noted to be hypotensive. Patient was aggressively resuscitated with blood transfusions along with FFP and platelets. He underwent CT angiogram of the abdomen which showed a local area of bleeding. Patient was transferred over to [facility] and seen by interventional radiology and underwent embolization. Gastroenterology was consulted. Plan is for colonoscopy. However, patient has not been cooperative with his prep. In the meantime, patient also developed significant leukocytosis with elevated pro-calcitonin levels thought to be due to pneumonia. These are improving. He also became encephalopathic, which is also improving.  Reason for Visit: Lower GI bleed  Consultants: Critical care medicine. Gastroenterology. Interventional radiology.  Procedures: Mesenteric angiogram with Gelfoam embolization of the distal branch of the middle colic artery at the hepatic flexure.  Antibiotics: Vancomycin and Zosyn initiated on 11/1. Stopped on 11/4. Unasyn started on 11/4  Subjective/Interval History: Patient is much more awake and alert. Denies any complaints. Denies any shortness of breath, nausea, vomiting or abdominal pain at this time.   ROS: Denies any nausea or vomiting.   Objective:  Vital Signs  Vitals:   05/17/16 2124 05/17/16 2210 05/18/16 0333 05/18/16 0700  BP:  (!) 149/65  (!) 176/66  Pulse:  74  79  Resp:    20  Temp:    99.6 F (37.6 C)  TempSrc:    Oral  SpO2: 94%   97%  Weight:   62.3 kg (137 lb 5.6 oz)   Height:        Intake/Output Summary (Last 24 hours) at 05/18/16  0959 Last data filed at 05/18/16 0727  Gross per 24 hour  Intake              350 ml  Output             1750 ml  Net            -1400 ml   Filed Weights   05/16/16 0500 05/17/16 0433 05/18/16 0333  Weight: 58 kg (127 lb 13.9 oz) 61.8 kg (136 lb 3.9 oz) 62.3 kg (137 lb 5.6 oz)    General appearance: Awake and alert. Still slightly distracted. Following commands. Resp: Improved air entry bilaterally. Coarse breath sounds. No definite wheezing, rales or rhonchi.  Cardio: regular rate and rhythm, S1, S2 normal, no murmur, click, rub or gallop GI: Abdomen is soft. Nontender. No masses or organomegaly. Bowel sounds are present and appear to be normal.  Extremities: extremities normal, atraumatic, no cyanosis or edema Neurologic: Patient is awake and alert. He could tell me that he was in a hospital. He could tell me the name of the hospital. Somewhat confused today about year and month. He could tell me the name of the president with some assistance. No facial asymmetry. Tongue is midline. Moving all his extremities.  Lab Results:  Data Reviewed: I have personally reviewed following labs and imaging studies  CBC:  Recent Labs Lab 05/11/16 1741  05/15/16 0132 05/16/16 0233 05/17/16 0151 05/17/16 1336 05/18/16 0600  WBC 16.5*  < > 16.8* 13.6* 10.5 11.7* 8.7  NEUTROABS 5.6  --   --   --   --   --   --  HGB 7.8*  < > 10.9* 9.3* 8.8* 9.2* 8.8*  HCT 23.6*  < > 31.5* 27.3* 26.3* 28.1* 27.2*  MCV 93.5  < > 80.4 81.3 82.7 84.1 85.3  PLT 114*  < > 71* 70* 75* 77* 76*  < > = values in this interval not displayed.  Basic Metabolic Panel:  Recent Labs Lab 05/12/16 0355 05/12/16 1057 05/13/16 0222  05/15/16 1044 05/15/16 1218 05/16/16 0233 05/16/16 1616 05/17/16 0151 05/18/16 0600  NA 143  --  141  < > 143  --  144 144 144 142  K 4.8  --  3.9  < > 2.8*  --  2.8* 3.7 3.6 3.7  CL 119*  --  117*  < > 112*  --  112* 113* 114* 110  CO2 20*  --  15*  < > 23  --  25 25 25 25   GLUCOSE  141*  --  137*  < > 110*  --  104* 120* 102* 81  BUN 22*  --  38*  < > 19  --  17 15 14 10   CREATININE 1.21  --  1.95*  < > 1.06  --  1.00 0.99 0.90 0.93  CALCIUM 7.4*  --  7.9*  < > 7.7*  --  7.8* 8.1* 7.6* 7.5*  MG 1.6*  --  2.3  --   --  1.9  --   --   --   --   PHOS 4.2 5.1*  --   --   --   --   --   --   --   --   < > = values in this interval not displayed.  GFR: Estimated Creatinine Clearance: 47.5 mL/min (by C-G formula based on SCr of 0.93 mg/dL).  Liver Function Tests:  Recent Labs Lab 05/11/16 1741 05/14/16 0251  AST 22 66*  ALT 9* 23  ALKPHOS 48 56  BILITOT 0.5 1.1  PROT 5.1* 5.0*  ALBUMIN 3.2* 2.7*     Recent Labs Lab 05/11/16 1741  LIPASE 37   Coagulation Profile:  Recent Labs Lab 05/11/16 1741  INR 1.25    Cardiac Enzymes:  Recent Labs Lab 05/11/16 1741 05/12/16 0355 05/12/16 1057 05/12/16 1607 05/12/16 2238  TROPONINI <0.03 0.06* 0.05* 0.06* 0.11*    CBG:  Recent Labs Lab 05/11/16 2244  GLUCAP 143*     Recent Results (from the past 240 hour(s))  MRSA PCR Screening     Status: None   Collection Time: 05/11/16 10:38 PM  Result Value Ref Range Status   MRSA by PCR NEGATIVE NEGATIVE Final    Comment:        The GeneXpert MRSA Assay (FDA approved for NASAL specimens only), is one component of a comprehensive MRSA colonization surveillance program. It is not intended to diagnose MRSA infection nor to guide or monitor treatment for MRSA infections.   Culture, blood (routine x 2)     Status: None (Preliminary result)   Collection Time: 05/13/16  6:15 AM  Result Value Ref Range Status   Specimen Description BLOOD RIGHT HAND  Final   Special Requests BOTTLES DRAWN AEROBIC ONLY 5.0CC  Final   Culture NO GROWTH 4 DAYS  Final   Report Status PENDING  Incomplete  Culture, blood (routine x 2)     Status: None (Preliminary result)   Collection Time: 05/13/16  6:25 AM  Result Value Ref Range Status   Specimen Description BLOOD LEFT  HAND  Final  Special Requests BOTTLES DRAWN AEROBIC AND ANAEROBIC 5 CC EACH  Final   Culture NO GROWTH 4 DAYS  Final   Report Status PENDING  Incomplete      Radiology Studies: No results found.   Medications:  Scheduled: . ampicillin-sulbactam (UNASYN) IV  3 g Intravenous Q8H  . famotidine  20 mg Oral BID  . metoprolol tartrate  12.5 mg Oral BID  . thiamine  100 mg Oral Daily   Continuous:  PRN:  Assessment/Plan:  Active Problems:   Acute lower GI bleeding   Gastrointestinal hemorrhage   Acute blood loss anemia   Arterial hypotension   Acute pain of left knee   Slurred speech   Abdominal pain   Ileus (HCC)   Acute encephalopathy   Hypokalemia   Abnormal CT of the abdomen    Lower GI bleed. Patient is status post embolization of the middle colic artery. This was done on 10/31 by interventional radiology. Has not been noted to have any further bloody stools. Gastroenterology is following. Initially plan was to do a colonoscopy. However, patient could not take his prep, so it was held. GI in discussion with patient and family to determine further course of action. Hemoglobin has trended down over the last few days, but remains stable He hasn't had any rectal bleeding per nursing staff.  Abdominal distention/Ileus Clinically, appears to have improved. Advance diet per SLP.  Acute encephalopathy. Patient's mental status has improved. Continue to reorient daily. He does not have any focal neurological deficits. Acute encephalopathy was most likely due to hospital stay and acute illness. There has been no history of falls or trauma. B-12 was 350. TSH 4.13.  Acute blood loss anemia secondary to GI bleed. Hemoglobin has slowly trendedg down, but stable. However, there has been no overt rectal bleeding. Monitor for now. Hemoglobin had responded appropriately to blood transfusion. Last transfused on 11/1. In total, he has received 6 units of PRBC, 1 unit of FFP and 1 unit of  platelets so far.   Leukocytosis with questionable infiltrate on chest x-ray Patient had a significant rise in his WBC. No obvious focus of infection is identified except for perhaps aspiration pneumonia. His lactic acid was noted to be elevated and his pro-calcitonin was also significantly elevated. Blood cultures were obtained and are negative. UA did not show any evidence for UTI. He was given fluid boluses. Lactic acid level has improved. He was started on vancomycin and Zosyn. Pro-calcitonin level is also improved. WBC is now normal. Transitioned to Unasyn. As his oral intake improves, he will be changed over to Augmentin eventually.   Questionable pneumonia, possible aspiration. See above. Seen by speech therapy. Some concern was about possible aspiration. Which could have been due to his mental status. For now he is on full liquid diet. SLP to reevaluate. Continue Unasyn for now.  Essential Hypertension Patient was initially hypotensive. Secondary to acute GI hemorrhage. Blood pressures have stabilized and now in the hypertensive range. Metoprolol dose will be increased. We will add amlodipine for better blood pressure control. Hydralazine as needed. Patient also noted to have sinus tachycardia, which is also likely secondary to his agitation. Blood pressure and heart rate have improved. Continue to monitor.  Acute renal failure with non-anion gap metabolic acidosis Now resolved with IV fluids. Acute renal failure was most likely secondary to hypovolemia. Patient also received contrast for his CT scan. Monitor urine upper closely. Renal function has improved and now back to baseline. Saline lock IV.  Bicarbonate level has also improved. No renal abnormalities noted on CT scan except for bilateral renal cysts.  Hypokalemia Potassium level has improved. Continue to monitor periodically.  Thrombocytopenia Counts are low but stable for the most part. Continue to monitor platelet counts closely.  Avoid heparin products. Etiology remains unclear. Needs outpatient monitoring.  Questionable slurred speech at the time of initial assessment. Patient does not have any neurological deficits. Continue to monitor. PT evaluation ongoing.  Left knee pain. X-ray did not show any acute process. Pain appears to have resolved.  DVT Prophylaxis: SCDs    Code Status: Full code  Family Communication: No family at bedside  Disposition Plan: Patient appears to be slowly improving. Continue management as outlined above. Await further GI input regarding colonoscopy. Will need to go to skilled nursing facility when ready for discharge.     LOS: 7 days   [facility] Pager [phone] 05/18/2016, 9:59 AM  If 7PM-7AM, please contact night-coverage at [email or web address], password [facility]

## 2016-05-18 NOTE — Progress Notes (Signed)
Physical Therapy Treatment Patient Details Name: Dustin Howell MRN: [id number] DOB: [birth year] Today's Date: 05/18/2016    History of Present Illness Pt is an 82 y/o male admitted secondary to GI bleed, weakness, hypotension and encephalopathy. No PMH on file.    PT Comments    Pt with significant improvement in ambulation tolerance however limited this date by stool incontinence. Pt was able to negotiate stairs with min/modA. Pt remains appropriate for SNF upon d/c to allow for progression to safe mod I level of function. Acute PT to con't to follow.   Follow Up Recommendations  SNF;Supervision/Assistance - 24 hour     Equipment Recommendations  None recommended by PT    Recommendations for Other Services       Precautions / Restrictions Precautions Precautions: Fall Restrictions Weight Bearing Restrictions: No    Mobility  Bed Mobility               General bed mobility comments: pt up in chair  Transfers Overall transfer level: Needs assistance Equipment used: Rolling walker (2 wheeled) Transfers: Sit to/from Stand Sit to Stand: Min assist         General transfer comment: pt requires increased time, v/c's for safe hand placement  Ambulation/Gait Ambulation/Gait assistance: Min assist Ambulation Distance (Feet): 100 Feet Assistive device: Rolling walker (2 wheeled) Gait Pattern/deviations: Step-through pattern;Decreased stride length;Trunk flexed Gait velocity: slow Gait velocity interpretation: Below normal speed for age/gender General Gait Details: v/c's to stand up right and increase base of support   Stairs Stairs: Yes Stairs assistance: Min assist Stair Management: One rail Right;Step to pattern Number of Stairs: 4 General stair comments: pt educated on up with the good and down with the bad, pt with labored effort ascending requiring minA and minA required to control descend  Wheelchair Mobility    Modified Rankin (Stroke Patients Only)        Balance Overall balance assessment: Needs assistance Sitting-balance support: Feet supported Sitting balance-Leahy Scale: Good       Standing balance-Leahy Scale: Poor                      Cognition Arousal/Alertness: Awake/alert Behavior During Therapy: WFL for tasks assessed/performed Overall Cognitive Status: No family/caregiver present to determine baseline cognitive functioning   Orientation Level: Disoriented to;Time;Situation (reports he needs a colostomy)       Safety/Judgement: Decreased awareness of safety          Exercises      General Comments General comments (skin integrity, edema, etc.): pt had an episode of loose stool incontinence upon completing stair negotiation, pt dependent for hygiene      Pertinent Vitals/Pain Pain Assessment: No/denies pain    Home Living Family/patient expects to be discharged to:: Skilled nursing facility                    Prior Function Level of Independence: Independent      Comments: pt's daughter and nephew stated that pt was very active prior to admission and very independent with everything   PT Goals (current goals can now be found in the care plan section) Acute Rehab PT Goals Patient Stated Goal: go home Progress towards PT goals: Progressing toward goals    Frequency    Min 3X/week      PT Plan Current plan remains appropriate    Co-evaluation             End of Session Equipment Utilized During  Treatment: Gait belt Activity Tolerance: Patient tolerated treatment well Patient left: in bed;with call bell/phone within reach;with bed alarm set;with family/visitor present;with restraints reapplied     Time: [id number] PT Time Calculation (min) (ACUTE ONLY): 30 min  Charges:  $Gait Training: 23-37 mins                    G Codes:      Kingsley Callander 05/18/2016, 1:20 PM   Kittie Plater, PT, DPT Pager #: [phone] Office #: [phone]

## 2016-05-18 NOTE — Care Management Important Message (Signed)
Important Message  Patient Details  Name: HAWKINS DUYCK MRN: [id number] Date of Birth: 1924/07/21   Medicare Important Message Given:  Yes    Grenda Lora Abena 05/18/2016, 12:13 PM

## 2016-05-18 NOTE — Progress Notes (Addendum)
Occupational Therapy Evaluation Patient Details Name: Dustin Howell MRN: [id number] DOB: February 12, 1925 Today's Date: 05/18/2016    History of Present Illness Pt is an 82 y/o male admitted secondary to GI bleed, weakness, hypotension and encephalopathy. No PMH on file.   Clinical Impression   PTA, pt lived alone and was independent with ADL and mobility. Pt currently requires min A with mobility and ADL. PT will benefit from rehab at [facility]. Will follow acutely to address established goals.     Follow Up Recommendations  SNF;Supervision/Assistance - 24 hour    Equipment Recommendations  Other (comment) (TBA at SNF)    Recommendations for Other Services       Precautions / Restrictions Precautions Precautions: Fall      Mobility Bed Mobility                  Transfers Overall transfer level: Needs assistance   Transfers: Sit to/from Stand Sit to Stand: Min assist              Balance   Sitting-balance support: Feet supported Sitting balance-Leahy Scale: Good       Standing balance-Leahy Scale: Poor                              ADL Overall ADL's : Needs assistance/impaired     Grooming: Min guard;Standing   Upper Body Bathing: Set up;Sitting   Lower Body Bathing: Minimal assistance;Sit to/from stand   Upper Body Dressing : Set up;Sitting   Lower Body Dressing: Minimal assistance;Sit to/from stand   Toilet Transfer: Minimal assistance   Toileting- Clothing Manipulation and Hygiene: Maximal assistance Toileting - Clothing Manipulation Details (indicate cue type and reason): incontinent of BM  Pt incontinent in PT session. Sitting in recliner and appeared unaware that he was soiled. When trying to to move around room, pt getting tangled in IV line.     Functional mobility during ADLs: Minimal assistance       Vision     Perception     Praxis      Pertinent Vitals/Pain Pain Assessment: No/denies pain     Hand Dominance  Right   Extremity/Trunk Assessment Upper Extremity Assessment Upper Extremity Assessment: Generalized weakness   Lower Extremity Assessment Lower Extremity Assessment: Defer to PT evaluation   Cervical / Trunk Assessment Cervical / Trunk Assessment: Kyphotic   Communication Communication Communication: HOH   Cognition Arousal/Alertness: Awake/alert Behavior During Therapy: WFL for tasks assessed/performed Overall Cognitive Status: No family/caregiver present to determine baseline cognitive functioning           Safety/Judgement: Decreased awareness of safety (getting wrapped up in IV/lines)         General Comments       Exercises       Shoulder Instructions      Home Living Family/patient expects to be discharged to:: Skilled nursing facility                                        Prior Functioning/Environment Level of Independence: Independent        Comments: pt's daughter and nephew stated that pt was very active prior to admission and very independent with everything        OT Problem List: Decreased strength;Decreased activity tolerance;Impaired balance (sitting and/or standing);Decreased safety awareness   OT Treatment/Interventions: Self-care/ADL  training;Therapeutic exercise;DME and/or AE instruction;Therapeutic activities;Cognitive remediation/compensation;Patient/family education;Balance training    OT Goals(Current goals can be found in the care plan section) Acute Rehab OT Goals Patient Stated Goal: to go home OT Goal Formulation: With patient Time For Goal Achievement: 06/01/16 Potential to Achieve Goals: Good  OT Frequency: Min 2X/week   Barriers to D/C: Decreased caregiver support          Co-evaluation              End of Session Equipment Utilized During Treatment: Gait belt Nurse Communication: Mobility status  Activity Tolerance: Patient tolerated treatment well Patient left: in chair;with call  bell/phone within reach;with chair alarm set   Time: 1140-1203 OT Time Calculation (min): 23 min Charges:  OT General Charges $OT Visit: 1 Procedure OT Evaluation $OT Eval Moderate Complexity: 1 Procedure OT Treatments $Self Care/Home Management : 8-22 mins G-Codes:    Rosealie Reach,HILLARY 2016-06-13, 1:06 PM   [facility], OTR/L  [phone] 13-Jun-2016

## 2016-05-18 NOTE — Progress Notes (Signed)
RN spoke to pt daughter, [name]. Daughter questioned if colonoscopy was scheduled, information given- no colonoscopy scheduled at this time. She will call later in the day to get an update.

## 2016-05-18 NOTE — Progress Notes (Signed)
Speech Language Pathology Treatment: Dysphagia  Patient Details Name: Dustin Howell MRN: [id number] DOB: 22-Mar-1925 Today's Date: 05/18/2016 Time: 0937-1000 SLP Time Calculation (min) (ACUTE ONLY): 23 min  Assessment / Plan / Recommendation Clinical Impression  Pt alert and cooperative, but mildly confused, unable to participate in higher level reasoning and short and long term memory impaired. Pt demonstrates consistent coughing with thin liquids, takes overlarge rapid sips. Coughing is prolonged after intake. Nectar thick liquids are consistently tolerated without signs of aspiration regardless of rate of intake. Pt also demonstrates ability to masticate with dentures in place, though there is significant effort required. Will initiate a dys 2/nectar thick diet to reduce risk of aspiration given that pt is weakness and functional reserve is poor. Hopefully with rehab and increased strength pt can upgrade diet. Will follow for progress.    HPI HPI: 82 year old Caucasian male with no significant past medical history and takes only a baby aspirin at home, presented to the emergency department at [facility] with complains of lower GI bleeding. Dx GI bleed, acute blood loss anemia, acute left knee pain, ileus, acute encephalopathy, concern for aspiration per MD notes.       SLP Plan  Continue with current plan of care     Recommendations  Diet recommendations: Dysphagia 2 (fine chop);Nectar-thick liquid Liquids provided via: Cup Medication Administration: Whole meds with puree Supervision: Patient able to self feed Compensations: Slow rate;Small sips/bites Postural Changes and/or Swallow Maneuvers: Seated upright 90 degrees                Oral Care Recommendations: Oral care BID Follow up Recommendations: Skilled Nursing facility Plan: Continue with current plan of care       [place] Doloros Kwolek, MA CCC-SLP [id number]  Lynann Beaver 05/18/2016, 10:03  AM

## 2016-05-19 ENCOUNTER — Encounter
Admission: RE | Admit: 2016-05-19 | Discharge: 2016-05-19 | Disposition: A | Discharge status: Home / Self Care | Payer: Medicare Other | Source: Ambulatory Visit | Attending: Internal Medicine | Admitting: Internal Medicine

## 2016-05-19 DIAGNOSIS — D696 Thrombocytopenia, unspecified: Secondary | ICD-10-CM | POA: Insufficient documentation

## 2016-05-19 DIAGNOSIS — M6281 Muscle weakness (generalized): Secondary | ICD-10-CM | POA: Insufficient documentation

## 2016-05-19 LAB — BASIC METABOLIC PANEL
ANION GAP: 6 (ref 5–15)
BUN: 12 mg/dL (ref 6–20)
CALCIUM: 7.9 mg/dL — AB (ref 8.9–10.3)
CO2: 24 mmol/L (ref 22–32)
Chloride: 111 mmol/L (ref 101–111)
Creatinine, Ser: 0.97 mg/dL (ref 0.61–1.24)
Glucose, Bld: 85 mg/dL (ref 65–99)
Potassium: 4.1 mmol/L (ref 3.5–5.1)
SODIUM: 141 mmol/L (ref 135–145)

## 2016-05-19 LAB — CBC
HEMATOCRIT: 28 % — AB (ref 39.0–52.0)
Hemoglobin: 9.1 g/dL — ABNORMAL LOW (ref 13.0–17.0)
MCH: 27.9 pg (ref 26.0–34.0)
MCHC: 32.5 g/dL (ref 30.0–36.0)
MCV: 85.9 fL (ref 78.0–100.0)
Platelets: 87 10*3/uL — ABNORMAL LOW (ref 150–400)
RBC: 3.26 MIL/uL — ABNORMAL LOW (ref 4.22–5.81)
RDW: 19.4 % — AB (ref 11.5–15.5)
WBC: 8.8 10*3/uL (ref 4.0–10.5)

## 2016-05-19 MED ORDER — RESOURCE THICKENUP CLEAR PO POWD: ORAL | Status: DC

## 2016-05-19 MED ORDER — ENSURE ENLIVE PO LIQD: 237.0000 mL | Freq: Two times a day (BID) | ORAL | 12 refills | Status: DC

## 2016-05-19 MED ORDER — METOPROLOL TARTRATE 25 MG PO TABS: 25.0000 mg | ORAL_TABLET | Freq: Two times a day (BID) | ORAL | Status: DC

## 2016-05-19 MED ORDER — AMOXICILLIN-POT CLAVULANATE 875-125 MG PO TABS: 1.0000 | ORAL_TABLET | Freq: Two times a day (BID) | ORAL | 0 refills | Status: AC

## 2016-05-19 MED ORDER — AMLODIPINE BESYLATE 5 MG PO TABS: 5.0000 mg | ORAL_TABLET | Freq: Every day | ORAL | 0 refills | Status: DC

## 2016-05-19 MED ORDER — ALUM & MAG HYDROXIDE-SIMETH 200-200-20 MG/5ML PO SUSP: 15.0000 mL | ORAL | 0 refills | Status: DC | PRN

## 2016-05-19 MED ORDER — THIAMINE HCL 100 MG PO TABS: 100.0000 mg | ORAL_TABLET | Freq: Every day | ORAL | Status: DC

## 2016-05-19 MED ORDER — ENSURE ENLIVE PO LIQD
237.0000 mL | Freq: Two times a day (BID) | ORAL | Status: DC
  Administered 2016-05-19: 237 mL via ORAL

## 2016-05-19 NOTE — Progress Notes (Signed)
[facility] Progress Note    Since last GI note: NO overt GI bleeding. Seems to be eating well.  He wants to go home today  Objective: Vital signs in last 24 hours: Temp:  [98.2 F (36.8 C)-98.5 F (36.Dustin C)] 98.5 F (36.Dustin C) (11/07 0459) Pulse Rate:  [69-91] 73 (11/07 0459) Resp:  [17-18] 18 (11/07 0459) BP: (149-175)/(58-78) 149/63 (11/07 0459) SpO2:  [91 %-93 %] 92 % (11/07 0459) Weight:  [136 lb 0.4 oz (61.7 kg)] 136 lb 0.4 oz (61.7 kg) (11/07 0459) Last BM Date: 05/17/16 General: alert and oriented times 3 (very hard of hearing but seems to answer appropriately) Heart: regular rate and rythm Abdomen: soft, non-tender, non-distended, normal bowel sounds   Lab Results:  Recent Labs  05/17/16 0151 05/17/16 1336 05/18/16 0600  WBC 10.5 11.7* 8.7  HGB 8.8* Dustin.2* 8.8*  PLT 75* 77* 76*  MCV 82.7 84.1 85.3    Recent Labs  05/16/16 1616 05/17/16 0151 05/18/16 0600  NA 144 144 142  K 3.7 3.6 3.7  CL 113* 114* 110  CO2 25 25 25   GLUCOSE 120* 102* 81  BUN 15 14 10   CREATININE 0.99 0.90 0.93  CALCIUM 8.1* 7.6* 7.5*    Medications: Scheduled Meds: . amLODipine  5 mg Oral Daily  . amoxicillin-clavulanate  1 tablet Oral Q12H  . feeding supplement (ENSURE ENLIVE)  237 mL Oral BID BM  . metoprolol tartrate  25 mg Oral BID  . thiamine  100 mg Oral Daily   Continuous Infusions: PRN Meds:.alum & mag hydroxide-simeth, RESOURCE THICKENUP CLEAR    Assessment/Plan: 82 y.o. 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 Specifically not interested in colonoscopy. AT his age, frailty and clinical stability following IR procedure I think that is completely reasonable.  He is [place] for from a [employer] and does not need scheduled GI follow up.  Please call if any recurrent overt GI bleeding. Will sign  off for now.    Milus Banister, MD  05/19/2016, 7:35 AM [facility] Pager [phone]

## 2016-05-19 NOTE — Clinical Social Work Placement (Signed)
   CLINICAL SOCIAL WORK PLACEMENT  NOTE  Date:  05/19/2016  Patient Details  Name: Dustin Howell MRN: [id number] Date of Birth: [birth year]  Clinical Social Work is seeking post-discharge placement for this patient at the [place] level of care (*CSW will initial, date and re-position this form in  chart as items are completed):  Yes   Patient/family provided with [place] Work Department's list of facilities offering this level of care within the geographic area requested by the patient (or if unable, by the patient's family).  Yes   Patient/family informed of their freedom to choose among providers that offer the needed level of care, that participate in Medicare, Medicaid or managed care program needed by the patient, have an available bed and are willing to accept the patient.  Yes   Patient/family informed of [facility]'s ownership interest in [facility] and [facility], as well as of the fact that they are under no obligation to receive care at these facilities.  PASRR submitted to EDS on 05/16/16     PASRR number received on 05/16/16     Existing PASRR number confirmed on       FL2 transmitted to all facilities in geographic area requested by pt/family on 05/16/16     FL2 transmitted to all facilities within larger geographic area on 05/16/16     Patient informed that his/her managed care company has contracts with or will negotiate with certain facilities, including the following:        Yes   Patient/family informed of bed offers received.  Patient chooses bed at [facility]     Physician recommends and patient chooses bed at      Patient to be transferred to [facility] on 05/19/16.  Patient to be transferred to facility by ptar     Patient family notified on 05/19/16 of transfer.  Name of family member notified:  Mrs. Brown     PHYSICIAN Please sign FL2     Additional Comment:     _______________________________________________ Jorge Ny, LCSW 05/19/2016, 1:52 PM

## 2016-05-19 NOTE — Discharge Instructions (Signed)

## 2016-05-19 NOTE — Progress Notes (Signed)
Patient will discharge to [facility] Anticipated discharge date: 11/7 Family notified: Renee  Transportation by Corey Harold- scheduled for 2:30pm  CSW signing off.  Jorge Ny, LCSW Clinical Social Worker [phone]

## 2016-05-19 NOTE — Discharge Summary (Signed)
[facility]  Physician Discharge Summary   Patient ID: Dustin Howell MRN: [id number] DOB/AGE: 01-20-1925 82 y.o.  Admit date: 05/11/2016 Discharge date: 05/19/2016  PCP: [facility]  DISCHARGE DIAGNOSES:  Active Problems:   Acute lower GI bleeding   Gastrointestinal hemorrhage   Acute blood loss anemia   Arterial hypotension   Acute pain of left knee   Slurred speech   Abdominal pain   Ileus (HCC)   Acute encephalopathy   Hypokalemia   Abnormal CT of the abdomen   RECOMMENDATIONS FOR OUTPATIENT FOLLOW UP: 1. CBC and basic metabolic panel on Friday and then weekly for 4 weeks 2. Speech pathologist to continue to assess swallow function at the skilled nursing facility.   DISCHARGE CONDITION: fair  Diet recommendation: Dysphagia 2 diet with nectar thick liquids. Needs to wear his dentures for meals.  Filed Weights   05/17/16 0433 05/18/16 0333 05/19/16 0459  Weight: 61.8 kg (136 lb 3.9 oz) 62.3 kg (137 lb 5.6 oz) 61.7 kg (136 lb 0.4 oz)    INITIAL HISTORY:  82 year old Caucasian male with no significant past medical history and takes only a baby aspirin at home, presented to the emergency department at [facility] with complains of lower GI bleeding. Patient also had an episode of syncope. He was noted to be hypotensive. Patient was aggressively resuscitated with blood transfusions along with FFP and platelets. He underwent CT angiogram of the abdomen which showed a local area of bleeding. Patient was transferred over to [facility] and seen by interventional radiology and underwent embolization. Gastroenterology was consulted. Plan is for colonoscopy. However, patient has not been cooperative with his prep. In the meantime, patient also developed significant leukocytosis with elevated pro-calcitonin levels thought to be due to pneumonia. These are improving. He also became encephalopathic, which is also improving.  Consultants: Critical care medicine.  Gastroenterology. Interventional radiology.  Procedures: Mesenteric angiogram with Gelfoam embolization of the distal branch of the middle colic artery at the hepatic flexure.   HOSPITAL COURSE:   Lower GI bleed. Patient presented with massive rectal bleeding. Evaluation revealed possible bleeding lesion in the colon. Patient was seen by interventional radiology. Patient is status post embolization of the middle colic artery. This was done on 10/31 by interventional radiology. Has not been noted to have any further bloody stools. Gastroenterology was also consulted. Initially plan was to do a colonoscopy. However, patient did not tolerate his prep and then became encephalopathic. Gastroenterology. Discussed with patient and family on numerous occasions. Currently plan is to hold off on colonoscopy. He can discuss this further with primary care physician.  Abdominal distention/Ileus Patient was initially kept on by mouth. Ileus has resolved.  Acute encephalopathy. Encephalopathy was most likely due to acute illness and hospital stay. He did not have any neurological deficits. B-12 was 350, and TSH was 4.13. His mental status has been improving and he appears to be close to baseline now.   Acute blood loss anemia secondary to GI bleed. Patient had developed significant anemia due to his blood loss. During his stay at [facility], patient has received a total of 6 units of PRBC, 1 unit of FFP and 1 unit of platelets. Hemoglobin has been low but stable. He hasn't had any further episodes of GI bleed. Should be monitored in the skilled nursing facility.   Leukocytosis with questionable infiltrate on chest x-ray Patient had a significant rise in his WBC. No obvious focus of infection is  identified except for perhaps aspiration pneumonia. His lactic acid was noted to be elevated and his pro-calcitonin was also significantly elevated. Blood cultures were obtained and  are negative. UA did not show any evidence for UTI. He was given fluid boluses. Lactic acid level has improved. He was started on vancomycin and Zosyn. Pro-calcitonin level is also improved. WBC is now normal. Patient was initially transitioned to Unasyn. Once his oral intake improved he was transitioned to Augmentin, which will be continued for 5 more days.   Questionable pneumonia, possible aspiration. There was a concern for aspiration pneumonia. Patient was placed on antibiotics as discussed above. Seen by speech therapy. Some concern was about possible aspiration which could have been due to his mental status. Patient advanced to dysphagia 2 diet with nectar thick liquids. He has dentures which he needs to wear while eating. He needs to be followed by speech pathology at the skilled nursing facility.  Essential Hypertension Patient was initially hypotensive. Secondary to acute GI hemorrhage. Blood pressures have stabilized and now in the hypertensive range. Blood pressure appeared to be getting under control with metoprolol and amlodipine. Continue to monitor BP at SNF. Patient's sinus tachycardia is also improved.   Acute renal failure with non-anion gap metabolic acidosis Acute renal failure was most likely secondary to hypovolemia. Patient also received contrast for his CT scan. He was given IV fluids. Renal function has improved and now back to baseline. He also had non-anion gap metabolic acidosis which has also improved. No renal abnormalities noted on CT scan except for bilateral renal cysts.  Hypokalemia Potassium level has improved.   Thrombocytopenia Counts are low but stable for the most part. Continue to monitor platelet counts closely. Avoid heparin products. Etiology remains unclear. Needs outpatient monitoring. May need further evaluation if counts do not improve.  Questionable slurred speech at the time of initial assessment. Patient does not have any neurological  deficits. Speech is baseline.   Left knee pain. X-ray did not show any acute process. Pain appears to have resolved.  Overall, stable. Patient will benefit from going to skilled nursing facility for rehabilitation. Okay for discharge today. Discussed with his daughter.    PERTINENT LABS:  The results of significant diagnostics from this hospitalization (including imaging, microbiology, ancillary and laboratory) are listed below for reference.    Microbiology: Recent Results (from the past 240 hour(s))  MRSA PCR Screening     Status: None   Collection Time: 05/11/16 10:38 PM  Result Value Ref Range Status   MRSA by PCR NEGATIVE NEGATIVE Final    Comment:        The GeneXpert MRSA Assay (FDA approved for NASAL specimens only), is one component of a comprehensive MRSA colonization surveillance program. It is not intended to diagnose MRSA infection nor to guide or monitor treatment for MRSA infections.   Culture, blood (routine x 2)     Status: None   Collection Time: 05/13/16  6:15 AM  Result Value Ref Range Status   Specimen Description BLOOD RIGHT HAND  Final   Special Requests BOTTLES DRAWN AEROBIC ONLY 5.0CC  Final   Culture NO GROWTH 5 DAYS  Final   Report Status 05/18/2016 FINAL  Final  Culture, blood (routine x 2)     Status: None   Collection Time: 05/13/16  6:25 AM  Result Value Ref Range Status   Specimen Description BLOOD LEFT HAND  Final   Special Requests BOTTLES DRAWN AEROBIC AND ANAEROBIC 5 CC EACH  Final  Culture NO GROWTH 5 DAYS  Final   Report Status 05/18/2016 FINAL  Final     Labs: Basic Metabolic Panel:  Recent Labs Lab 05/13/16 0222  05/15/16 1218 05/16/16 0233 05/16/16 1616 05/17/16 0151 05/18/16 0600 05/19/16 0832  NA 141  < >  --  144 144 144 142 141  K 3.9  < >  --  2.8* 3.7 3.6 3.7 4.1  CL 117*  < >  --  112* 113* 114* 110 111  CO2 15*  < >  --  25 25 25 25 24   GLUCOSE 137*  < >  --  104* 120* 102* 81 85  BUN 38*  < >  --  17 15  14 10 12   CREATININE 1.95*  < >  --  1.00 0.99 0.90 0.93 0.97  CALCIUM 7.9*  < >  --  7.8* 8.1* 7.6* 7.5* 7.9*  MG 2.3  --  1.9  --   --   --   --   --   < > = values in this interval not displayed. Liver Function Tests:  Recent Labs Lab 05/14/16 0251  AST 66*  ALT 23  ALKPHOS 56  BILITOT 1.1  PROT 5.0*  ALBUMIN 2.7*   CBC:  Recent Labs Lab 05/16/16 0233 05/17/16 0151 05/17/16 1336 05/18/16 0600 05/19/16 0832  WBC 13.6* 10.5 11.7* 8.7 8.8  HGB 9.3* 8.8* 9.2* 8.8* 9.1*  HCT 27.3* 26.3* 28.1* 27.2* 28.0*  MCV 81.3 82.7 84.1 85.3 85.9  PLT 70* 75* 77* 76* 87*   Cardiac Enzymes:  Recent Labs Lab 05/12/16 1607 05/12/16 2238  TROPONINI 0.06* 0.11*     IMAGING STUDIES Dg Abd 1 View  Result Date: 05/14/2016 CLINICAL DATA:  Abdominal pain and gas pain. EXAM: ABDOMEN - 1 VIEW COMPARISON:  05/13/2016 FINDINGS: Again noted are gas-filled loops of bowel throughout the abdomen. The most dilated loops of bowel appear to represent colon. There is some small bowel gas distension. Limited evaluation for free air on this single supine image. IMPRESSION: Gas-filled loops of bowel throughout the abdomen. Minimal change from the previous examination and findings may represent an ileus. Electronically Signed   By: Markus Daft M.D.   On: 05/14/2016 10:51   Ir Angiogram Visceral Selective  Result Date: 05/12/2016 INDICATION: 82 year old male with life-threatening lower GI hemorrhage. CT angiogram demonstrates hemorrhage within the distribution the right colic artery/middle colic artery near the hepatic flexure. EXAM: SELECTIVE VISCERAL ARTERIOGRAPHY; IR EMBO ART VEN HEMORR LYMPH EXTRAV INC GUIDE ROADMAPPING; IR ULTRASOUND GUIDANCE VASC ACCESS RIGHT; ADDITIONAL ARTERIOGRAPHY; ARTERIOGRAPHY MEDICATIONS: 100 mcg nitro, 4 mg IV Zofran. The antibiotic was administered within 1 hour of the procedure ANESTHESIA/SEDATION: Moderate (conscious) sedation was employed during this procedure. A total of  Versed 2.0 mg and Fentanyl 100 mcg was administered intravenously. Moderate Sedation Time: 90 minutes. The patient's level of consciousness and vital signs were monitored continuously by radiology nursing throughout the procedure under my direct supervision. CONTRAST:  160 cc Isovue FLUOROSCOPY TIME:  Fluoroscopy Time: 16 minutes 0 seconds (31.3 mGy). COMPLICATIONS: None PROCEDURE: Informed consent was obtained from the patient following explanation of the procedure, risks, benefits and alternatives. The patient understands, agrees and consents for the procedure. All questions were addressed. A time out was performed prior to the initiation of the procedure. Maximal barrier sterile technique utilized including caps, mask, sterile gowns, sterile gloves, large sterile drape, hand hygiene, and Betadine prep. Ultrasound survey of the right inguinal region was performed with images  stored and sent to PACs. A micropuncture needle was used access the right common femoral artery under ultrasound. With excellent arterial blood flow returned, and an .018 micro wire was passed through the needle, observed enter the abdominal aorta under fluoroscopy. The needle was removed, and a micropuncture sheath was placed over the wire. The inner dilator and wire were removed, and an 035 Bentson wire was advanced under fluoroscopy into the abdominal aorta. The sheath was removed and a standard 5 [place] vascular sheath was placed. The dilator was removed and the sheath was flushed. Standard 5 French C2 Cobra catheter was advanced over the [place] wire. Cobra catheter was used to select the superior mesenteric artery. Angiogram was performed. C2 Cobra was exchanged over the Bentson wire for [place] catheter. Micro catheter system was then used through the [place] catheter with 0.016 micro wire and 135cm Lan[place] micro catheter. Right/middle hepatic artery was then selected as the vasculature perfusing the area of interest. Sub selective  angiogram performed of branches of the right/ middle artery. Given the significant tortuosity of the vasculature and small caliber, micro catheter was not able to advance to a distal position. Angiogram demonstrated no evidence of active extravasation. 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 Single Gel-Foam pledget infused into target artery of interest, and then withdrawal of the catheter in repeat angiogram demonstrated small additional dysplastic branches at the hepatic flexure. A course Gel-Foam slurry was then infused with no significant filling at the completion. Micro catheter was flushed, withdrawn into the superior mesenteric artery, and then angiogram of the collateral vasculature to the GDA was performed. No perfusion of the areae of interest observed. Limited angiogram of the right common femoral artery performed. Exoseal was deployed for closure. Patient tolerated the procedure well and remained hemodynamically stable throughout. No complications were encountered and no significant blood loss encountered. FINDINGS: Ultrasound survey demonstrates mild atherosclerotic changes of the right common femoral artery which is widely patent. Superior mesenteric artery angiogram demonstrates patent superior mesenteric artery, patent ileal colic artery, patent arcades to the small bowel vasculature. 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 Splenic artery patent, left gastric artery patent, common hepatic artery patent, left hepatic artery and right hepatic artery patent. Large collateral vasculature contributed to the origin of gastroepiploic artery. Perfusion of the hepatic flexure was predominantly via right colic/middle colic branch. Sub selective angiogram of the colic branches demonstrated no active extravasation or  pseudoaneurysm. The knee shunting observed with angio dysplasia. Given the tortuosity and the inability to place the tip of the micro catheter within distal branches, no proximal coil was deployed. Instead, course Gel-Foam embolization of the territory was elected as treatment to decrease the pressure to the territory. Puncture of the right common femoral artery. 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 Tortuosity and small vasculature precluded a distal catheter position into the territory for deposition of metallic coils. Deployment of Exoseal for hemostasis. Signed, Dulcy Fanny. Earleen Newport, DO [facility] Radiology Electronically Signed   By: Corrie Mckusick D.O.   On: 05/12/2016 08:22   Ir Angiogram Selective Each Additional Vessel  Result Date: 05/12/2016 INDICATION: 82 year old male with life-threatening lower GI hemorrhage. CT angiogram demonstrates hemorrhage within the distribution the right colic artery/middle colic artery near  the hepatic flexure. EXAM: SELECTIVE VISCERAL ARTERIOGRAPHY; IR EMBO ART VEN HEMORR LYMPH EXTRAV INC GUIDE ROADMAPPING; IR ULTRASOUND GUIDANCE VASC ACCESS RIGHT; ADDITIONAL ARTERIOGRAPHY; ARTERIOGRAPHY MEDICATIONS: 100 mcg nitro, 4 mg IV Zofran. The antibiotic was administered within 1 hour of the procedure ANESTHESIA/SEDATION: Moderate (conscious) sedation was employed during this procedure. A total of Versed 2.0 mg and Fentanyl 100 mcg was administered intravenously. Moderate Sedation Time: 90 minutes. The patient's level of consciousness and vital signs were monitored continuously by radiology nursing throughout the procedure under my direct supervision. CONTRAST:  160 cc Isovue FLUOROSCOPY TIME:  Fluoroscopy Time: 16 minutes 0 seconds (31.3 mGy). COMPLICATIONS: None PROCEDURE: Informed consent  was obtained from the patient following explanation of the procedure, risks, benefits and alternatives. The patient understands, agrees and consents for the procedure. All questions were addressed. A time out was performed prior to the initiation of the procedure. Maximal barrier sterile technique utilized including caps, mask, sterile gowns, sterile gloves, large sterile drape, hand hygiene, and Betadine prep. Ultrasound survey of the right inguinal region was performed with images stored and sent to PACs. A micropuncture needle was used access the right common femoral artery under ultrasound. With excellent arterial blood flow returned, and an .018 micro wire was passed through the needle, observed enter the abdominal aorta under fluoroscopy. The needle was removed, and a micropuncture sheath was placed over the wire. The inner dilator and wire were removed, and an 035 Bentson wire was advanced under fluoroscopy into the abdominal aorta. The sheath was removed and a standard 5 [place] vascular sheath was placed. The dilator was removed and the sheath was flushed. Standard 5 French C2 Cobra catheter was advanced over the [place] wire. Cobra catheter was used to select the superior mesenteric artery. Angiogram was performed. C2 Cobra was exchanged over the Bentson wire for [place] catheter. Micro catheter system was then used through the [place] catheter with 0.016 micro wire and 135cm Lantern micro catheter. Right/middle hepatic artery was then selected as the vasculature perfusing the area of interest. Sub selective angiogram performed of branches of the right/ middle artery. Given the significant tortuosity of the vasculature and small caliber, micro catheter was not able to advance to a distal position. Angiogram demonstrated no evidence of active extravasation. 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 Single Gel-Foam pledget infused into target artery of interest, and then withdrawal of the  catheter in repeat angiogram demonstrated small additional dysplastic branches at the hepatic flexure. A course Gel-Foam slurry was then infused with no significant filling at the completion. Micro catheter was flushed, withdrawn into the superior mesenteric artery, and then angiogram of the collateral vasculature to the GDA was performed. No perfusion of the areae of interest observed. Limited angiogram of the right common femoral artery performed. Exoseal was deployed for closure. Patient tolerated the procedure well and remained hemodynamically stable throughout. No complications were encountered and no significant blood loss encountered. FINDINGS: Ultrasound survey demonstrates mild atherosclerotic changes of the right common femoral artery which is widely patent. Superior mesenteric artery angiogram demonstrates patent superior mesenteric artery, patent ileal colic artery, patent arcades to the small bowel vasculature. 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 Splenic artery patent, left gastric artery patent, common hepatic artery patent, left hepatic artery and right hepatic artery patent. Large collateral vasculature contributed to the origin of gastroepiploic artery. Perfusion  of the hepatic flexure was predominantly via right colic/middle colic branch. Sub selective angiogram of the colic branches demonstrated no active extravasation or pseudoaneurysm. The knee shunting observed with angio dysplasia. Given the tortuosity and the inability to place the tip of the micro catheter within distal branches, no proximal coil was deployed. Instead, course Gel-Foam embolization of the territory was elected as treatment to decrease the pressure to the territory. Puncture of the right common femoral artery. 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 Tortuosity and small vasculature precluded a distal catheter position into the territory for deposition of metallic coils. Deployment of Exoseal for hemostasis. Signed, Dulcy Fanny. Earleen Newport, DO [facility] Radiology Electronically Signed   By: Corrie Mckusick D.O.   On: 05/12/2016 08:22   Ir Angiogram Selective Each Additional Vessel  Result Date: 05/12/2016 INDICATION: 82 year old male with life-threatening lower GI hemorrhage. CT angiogram demonstrates hemorrhage within the distribution the right colic artery/middle colic artery near the hepatic flexure. EXAM: SELECTIVE VISCERAL ARTERIOGRAPHY; IR EMBO ART VEN HEMORR LYMPH EXTRAV INC GUIDE ROADMAPPING; IR ULTRASOUND GUIDANCE VASC ACCESS RIGHT; ADDITIONAL ARTERIOGRAPHY; ARTERIOGRAPHY MEDICATIONS: 100 mcg nitro, 4 mg IV Zofran. The antibiotic was administered within 1 hour of the procedure ANESTHESIA/SEDATION: Moderate (conscious) sedation was employed during this procedure. A total of Versed 2.0 mg and Fentanyl 100 mcg was administered intravenously. Moderate Sedation Time: 90 minutes. The patient's level of consciousness and vital signs were monitored continuously by radiology nursing throughout the procedure under my direct supervision. CONTRAST:  160 cc Isovue FLUOROSCOPY TIME:  Fluoroscopy Time: 16 minutes 0 seconds (31.3 mGy). COMPLICATIONS: None PROCEDURE: Informed consent was obtained from the patient following explanation of the procedure, risks, benefits and alternatives. The patient understands, agrees and consents for the procedure. All questions were addressed. A time out was performed prior to the initiation of the procedure. Maximal barrier sterile technique utilized including caps, mask, sterile gowns, sterile gloves, large sterile drape, hand  hygiene, and Betadine prep. Ultrasound survey of the right inguinal region was performed with images stored and sent to PACs. A micropuncture needle was used access the right common femoral artery under ultrasound. With excellent arterial blood flow returned, and an .018 micro wire was passed through the needle, observed enter the abdominal aorta under fluoroscopy. The needle was removed, and a micropuncture sheath was placed over the wire. The inner dilator and wire were removed, and an 035 Bentson wire was advanced under fluoroscopy into the abdominal aorta. The sheath was removed and a standard 5 [place] vascular sheath was placed. The dilator was removed and the sheath was flushed. Standard 5 French C2 Cobra catheter was advanced over the [place] wire. Cobra catheter was used to select the superior mesenteric artery. Angiogram was performed. C2 Cobra was exchanged over the Bentson wire for [place] catheter. Micro catheter system was then used through the [place] catheter with 0.016 micro wire and 135cm Lantern micro catheter. Right/middle hepatic artery was then selected as the vasculature perfusing the area of interest. Sub selective angiogram performed of branches of the right/ middle artery. Given the significant tortuosity of the vasculature and small caliber, micro catheter was not able to advance to a distal position. Angiogram demonstrated no evidence of active extravasation. 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 Single Gel-Foam pledget infused into target artery of interest, and then withdrawal of the catheter in repeat angiogram demonstrated small additional  dysplastic branches at the hepatic flexure. A course Gel-Foam slurry was then infused with no significant filling at the completion. Micro catheter was flushed, withdrawn into the superior mesenteric artery, and then angiogram of the collateral vasculature to the GDA was performed. No perfusion of the areae of interest observed.  Limited angiogram of the right common femoral artery performed. Exoseal was deployed for closure. Patient tolerated the procedure well and remained hemodynamically stable throughout. No complications were encountered and no significant blood loss encountered. FINDINGS: Ultrasound survey demonstrates mild atherosclerotic changes of the right common femoral artery which is widely patent. Superior mesenteric artery angiogram demonstrates patent superior mesenteric artery, patent ileal colic artery, patent arcades to the small bowel vasculature. 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 Splenic artery patent, left gastric artery patent, common hepatic artery patent, left hepatic artery and right hepatic artery patent. Large collateral vasculature contributed to the origin of gastroepiploic artery. Perfusion of the hepatic flexure was predominantly via right colic/middle colic branch. Sub selective angiogram of the colic branches demonstrated no active extravasation or pseudoaneurysm. The knee shunting observed with angio dysplasia. Given the tortuosity and the inability to place the tip of the micro catheter within distal branches, no proximal coil was deployed. Instead, course Gel-Foam embolization of the territory was elected as treatment to decrease the pressure to the territory. Puncture of the right common femoral artery. 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 Tortuosity and small vasculature precluded a distal catheter position into the territory for deposition of metallic coils. Deployment of Exoseal for hemostasis. Signed, Dulcy Fanny. Earleen Newport, DO [facility] Radiology  Special[facility] Electronically Signed   By: Corrie Mckusick D.O.   On: 05/12/2016 08:22   Ir Angiogram Selective Each Additional Vessel  Result Date: 05/12/2016 INDICATION: 82 year old male with life-threatening lower GI hemorrhage. CT angiogram demonstrates hemorrhage within the distribution the right colic artery/middle colic artery near the hepatic flexure. EXAM: SELECTIVE VISCERAL ARTERIOGRAPHY; IR EMBO ART VEN HEMORR LYMPH EXTRAV INC GUIDE ROADMAPPING; IR ULTRASOUND GUIDANCE VASC ACCESS RIGHT; ADDITIONAL ARTERIOGRAPHY; ARTERIOGRAPHY MEDICATIONS: 100 mcg nitro, 4 mg IV Zofran. The antibiotic was administered within 1 hour of the procedure ANESTHESIA/SEDATION: Moderate (conscious) sedation was employed during this procedure. A total of Versed 2.0 mg and Fentanyl 100 mcg was administered intravenously. Moderate Sedation Time: 90 minutes. The patient's level of consciousness and vital signs were monitored continuously by radiology nursing throughout the procedure under my direct supervision. CONTRAST:  160 cc Isovue FLUOROSCOPY TIME:  Fluoroscopy Time: 16 minutes 0 seconds (31.3 mGy). COMPLICATIONS: None PROCEDURE: Informed consent was obtained from the patient following explanation of the procedure, risks, benefits and alternatives. The patient understands, agrees and consents for the procedure. All questions were addressed. A time out was performed prior to the initiation of the procedure. Maximal barrier sterile technique utilized including caps, mask, sterile gowns, sterile gloves, large sterile drape, hand hygiene, and Betadine prep. Ultrasound survey of the right inguinal region was performed with images stored and sent to PACs. A micropuncture needle was used access the right common femoral artery under ultrasound. With excellent arterial blood flow returned, and an .018 micro wire was passed through the needle, observed enter the abdominal aorta under fluoroscopy. The needle was removed,  and a micropuncture  sheath was placed over the wire. The inner dilator and wire were removed, and an 035 Bentson wire was advanced under fluoroscopy into the abdominal aorta. The sheath was removed and a standard 5 [place] vascular sheath was placed. The dilator was removed and the sheath was flushed. Standard 5 French C2 Cobra catheter was advanced over the [place] wire. Cobra catheter was used to select the superior mesenteric artery. Angiogram was performed. C2 Cobra was exchanged over the [place] wire for [place] catheter. Micro catheter system was then used through the [place] catheter with 0.016 micro wire and 135cm [place] micro catheter. Right/middle hepatic artery was then selected as the vasculature perfusing the area of interest. Sub selective angiogram performed of branches of the right/ middle artery. Given the significant tortuosity of the vasculature and small caliber, micro catheter was not able to advance to a distal position. Angiogram demonstrated no evidence of active extravasation. 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 Single Gel-Foam pledget infused into target artery of interest, and then withdrawal of the catheter in repeat angiogram demonstrated small additional dysplastic branches at the hepatic flexure. A course Gel-Foam slurry was then infused with no significant filling at the completion. Micro catheter was flushed, withdrawn into the superior mesenteric artery, and then angiogram of the collateral vasculature to the GDA was performed. No perfusion of the areae of interest observed. Limited angiogram of the right common femoral artery performed. Exoseal was deployed for closure. Patient tolerated the procedure well and remained hemodynamically stable throughout. No complications were encountered and no significant blood loss encountered. FINDINGS: Ultrasound survey demonstrates mild atherosclerotic changes of the right common femoral artery which is widely patent.  Superior mesenteric artery angiogram demonstrates patent superior mesenteric artery, patent ileal colic artery, patent arcades to the small bowel vasculature. 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 Splenic artery patent, left gastric artery patent, common hepatic artery patent, left hepatic artery and right hepatic artery patent. Large collateral vasculature contributed to the origin of gastroepiploic artery. Perfusion of the hepatic flexure was predominantly via right colic/middle colic branch. Sub selective angiogram of the colic branches demonstrated no active extravasation or pseudoaneurysm. The knee shunting observed with angio dysplasia. Given the tortuosity and the inability to place the tip of the micro catheter within distal branches, no proximal coil was deployed. Instead, course Gel-Foam embolization of the territory was elected as treatment to decrease the pressure to the territory. Puncture of the right common femoral artery. 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 Tortuosity and small vasculature precluded a distal catheter position into the territory for deposition of metallic coils. Deployment of Exoseal for hemostasis. Signed, Dulcy Fanny. Earleen Newport, DO Vascular and Interventional Radiology Specialists [facility] Radiology Electronically Signed   By: Corrie Mckusick D.O.   On: 05/12/2016 08:22   Ir Angiogram Follow Up Study  Result Date: 05/12/2016 INDICATION: 82 year old male with life-threatening lower GI hemorrhage. CT angiogram demonstrates hemorrhage within the distribution the right colic artery/middle colic artery near the hepatic flexure. EXAM: SELECTIVE VISCERAL  ARTERIOGRAPHY; IR EMBO ART VEN HEMORR LYMPH EXTRAV INC GUIDE ROADMAPPING; IR ULTRASOUND GUIDANCE VASC ACCESS RIGHT; ADDITIONAL ARTERIOGRAPHY; ARTERIOGRAPHY MEDICATIONS: 100 mcg nitro, 4 mg IV Zofran. The antibiotic was administered within 1 hour of the procedure ANESTHESIA/SEDATION: Moderate (conscious) sedation was  employed during this procedure. A total of Versed 2.0 mg and Fentanyl 100 mcg was administered intravenously. Moderate Sedation Time: 90 minutes. The patient's level of consciousness and vital signs were monitored continuously by radiology nursing throughout the procedure under my direct supervision. CONTRAST:  160 cc Isovue FLUOROSCOPY TIME:  Fluoroscopy Time: 16 minutes 0 seconds (31.3 mGy). COMPLICATIONS: None PROCEDURE: Informed consent was obtained from the patient following explanation of the procedure, risks, benefits and alternatives. The patient understands, agrees and consents for the procedure. All questions were addressed. A time out was performed prior to the initiation of the procedure. Maximal barrier sterile technique utilized including caps, mask, sterile gowns, sterile gloves, large sterile drape, hand hygiene, and Betadine prep. Ultrasound survey of the right inguinal region was performed with images stored and sent to PACs. A micropuncture needle was used access the right common femoral artery under ultrasound. With excellent arterial blood flow returned, and an .018 micro wire was passed through the needle, observed enter the abdominal aorta under fluoroscopy. The needle was removed, and a micropuncture sheath was placed over the wire. The inner dilator and wire were removed, and an 035 Bentson wire was advanced under fluoroscopy into the abdominal aorta. The sheath was removed and a standard 5 [place] vascular sheath was placed. The dilator was removed and the sheath was flushed. Standard 5 French C2 Cobra catheter was advanced over the [place] wire. Cobra catheter was used to select  the superior mesenteric artery. Angiogram was performed. C2 Cobra was exchanged over the [place] wire for [place] catheter. Micro catheter system was then used through the [place] catheter with 0.016 micro wire and 135cm [place] micro catheter. Right/middle hepatic artery was then selected as the vasculature perfusing the area of interest. Sub selective angiogram performed of branches of the right/ middle artery. Given the significant tortuosity of the vasculature and small caliber, micro catheter was not able to advance to a distal position. Angiogram demonstrated no evidence of active extravasation. 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 Single Gel-Foam pledget infused into target artery of interest, and then withdrawal of the catheter in repeat angiogram demonstrated small additional dysplastic branches at the hepatic flexure. A course Gel-Foam slurry was then infused with no significant filling at the completion. Micro catheter was flushed, withdrawn into the superior mesenteric artery, and then angiogram of the collateral vasculature to the GDA was performed. No perfusion of the areae of interest observed. Limited angiogram of the right common femoral artery performed. Exoseal was deployed for closure. Patient tolerated the procedure well and remained hemodynamically stable throughout. No complications were encountered and no significant blood loss encountered. FINDINGS: Ultrasound survey demonstrates mild atherosclerotic changes of the right common femoral artery which is widely patent. Superior mesenteric artery angiogram demonstrates patent superior mesenteric artery, patent ileal colic artery, patent arcades to the small bowel vasculature. 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 Splenic artery patent, left  gastric artery patent, common hepatic artery patent, left hepatic artery and right hepatic artery patent. Large collateral vasculature contributed to the origin of gastroepiploic artery. Perfusion of the hepatic flexure was predominantly via right colic/middle colic branch. Sub selective angiogram of the colic branches demonstrated no active extravasation or pseudoaneurysm. The knee shunting observed with angio dysplasia. Given the tortuosity and the inability to place the tip of the micro catheter within distal branches, no  proximal coil was deployed. Instead, course Gel-Foam embolization of the territory was elected as treatment to decrease the pressure to the territory. Puncture of the right common femoral artery. 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 Tortuosity and small vasculature precluded a distal catheter position into the territory for deposition of metallic coils. Deployment of Exoseal for hemostasis. Signed, Dulcy Fanny. Earleen Newport, DO Vascular and Interventional Radiology Specialists [facility] Radiology Electronically Signed   By: Corrie Mckusick D.O.   On: 05/12/2016 08:22   Ir US Guide Vasc Access Right  Result Date: 05/12/2016 INDICATION: 82 year old male with life-threatening lower GI hemorrhage. CT angiogram demonstrates hemorrhage within the distribution the right colic artery/middle colic artery near the hepatic flexure. EXAM: SELECTIVE VISCERAL ARTERIOGRAPHY; IR EMBO ART VEN HEMORR LYMPH EXTRAV INC GUIDE ROADMAPPING; IR ULTRASOUND GUIDANCE VASC ACCESS RIGHT; ADDITIONAL ARTERIOGRAPHY; ARTERIOGRAPHY MEDICATIONS: 100 mcg nitro, 4 mg IV Zofran. The antibiotic was administered within 1 hour of the procedure ANESTHESIA/SEDATION: Moderate (conscious) sedation was employed during this procedure. A total of Versed 2.0 mg and Fentanyl 100 mcg was administered  intravenously. Moderate Sedation Time: 90 minutes. The patient's level of consciousness and vital signs were monitored continuously by radiology nursing throughout the procedure under my direct supervision. CONTRAST:  160 cc Isovue FLUOROSCOPY TIME:  Fluoroscopy Time: 16 minutes 0 seconds (31.3 mGy). COMPLICATIONS: None PROCEDURE: Informed consent was obtained from the patient following explanation of the procedure, risks, benefits and alternatives. The patient understands, agrees and consents for the procedure. All questions were addressed. A time out was performed prior to the initiation of the procedure. Maximal barrier sterile technique utilized including caps, mask, sterile gowns, sterile gloves, large sterile drape, hand hygiene, and Betadine prep. Ultrasound survey of the right inguinal region was performed with images stored and sent to PACs. A micropuncture needle was used access the right common femoral artery under ultrasound. With excellent arterial blood flow returned, and an .018 micro wire was passed through the needle, observed enter the abdominal aorta under fluoroscopy. The needle was removed, and a micropuncture sheath was placed over the wire. The inner dilator and wire were removed, and an 035 Bentson wire was advanced under fluoroscopy into the abdominal aorta. The sheath was removed and a standard 5 [place] vascular sheath was placed. The dilator was removed and the sheath was flushed. Standard 5 French C2 Cobra catheter was advanced over the [place] wire. Cobra catheter was used to select the superior mesenteric artery. Angiogram was performed. C2 Cobra was exchanged over the [place] wire for [place] catheter. Micro catheter system was then used through the [place] catheter with 0.016 micro wire and 135cm Lantern micro catheter. Right/middle hepatic artery was then selected as the vasculature perfusing the area of interest. Sub selective angiogram performed of branches of the right/ middle  artery. Given the significant tortuosity of the vasculature and small caliber, micro catheter was not able to advance to a distal position. Angiogram demonstrated no evidence of active extravasation. 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 Single Gel-Foam pledget infused into target artery of interest, and then withdrawal of the catheter in repeat angiogram demonstrated small additional dysplastic branches at the hepatic flexure. A course Gel-Foam slurry was then infused with no significant filling at the completion. Micro catheter was flushed, withdrawn into the superior mesenteric artery, and then angiogram of the collateral vasculature to the GDA was performed. No perfusion of the areae of interest  observed. Limited angiogram of the right common femoral artery performed. Exoseal was deployed for closure. Patient tolerated the procedure well and remained hemodynamically stable throughout. No complications were encountered and no significant blood loss encountered. FINDINGS: Ultrasound survey demonstrates mild atherosclerotic changes of the right common femoral artery which is widely patent. Superior mesenteric artery angiogram demonstrates patent superior mesenteric artery, patent ileal colic artery, patent arcades to the small bowel vasculature. 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 Splenic artery patent, left gastric artery patent, common hepatic artery patent, left hepatic artery and right hepatic artery patent. Large collateral vasculature contributed to the origin of gastroepiploic artery. Perfusion of the hepatic flexure was predominantly via right colic/middle colic branch. Sub selective angiogram of the colic branches demonstrated no active extravasation or pseudoaneurysm. The knee shunting observed with angio  dysplasia. Given the tortuosity and the inability to place the tip of the micro catheter within distal branches, no proximal coil was deployed. Instead, course Gel-Foam embolization of the territory was elected as treatment to decrease the pressure to the territory. Puncture of the right common femoral artery. 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 Tortuosity and small vasculature precluded a distal catheter position into the territory for deposition of metallic coils. Deployment of Exoseal for hemostasis. Signed, Dulcy Fanny. Earleen Newport, DO Vascular and Interventional Radiology Specialists [facility] Electronically Signed   By: Corrie Mckusick D.O.   On: 05/12/2016 08:22   Dg Chest Port 1 View  Result Date: 05/13/2016 CLINICAL DATA:  Dyspnea. EXAM: PORTABLE CHEST 1 VIEW COMPARISON:  Radiographs of Nov 26, 2008. FINDINGS: Stable cardiomediastinal silhouette. Atherosclerosis of thoracic aorta is noted. No pneumothorax is noted. Right lung is clear. Mild left basilar atelectasis or infiltrate is noted with minimal associated pleural effusion. Bony thorax is unremarkable. IMPRESSION: Aortic atherosclerosis. Mild left basilar atelectasis or infiltrate is noted with minimal associated pleural effusion. Electronically Signed   By: Marijo Conception, M.D.   On: 05/13/2016 07:30   Dg Knee Left Port  Result Date: 05/12/2016 CLINICAL DATA:  Recent fall.  Pain . EXAM: PORTABLE LEFT KNEE - 1-2 VIEW COMPARISON:  No recent prior. FINDINGS: Mild tricompartment degenerative change. No acute bony or joint abnormality identified. No evidence of fracture or dislocation. IMPRESSION: Mild tricompartment degenerative change.  No acute abnormality Electronically Signed   By: Marcello Moores  Register   On: 05/12/2016 07:32   Dg Abd Portable 1v  Result Date: 05/13/2016 CLINICAL DATA:  Initial  evaluation for acute mid abdominal pain. EXAM: PORTABLE ABDOMEN - 1 VIEW COMPARISON:  Prior CT from 05/11/2016. FINDINGS: Multiple prominent gas-filled loops of bowel are seen throughout the abdomen. These loops predominantly reflect the colon, although a few prominent loops of small bowel present as well. No air-fluid levels. No free air on these limited supine views. Imaging findings suggest ileus, although developing distal obstructive process not excluded. No soft tissue mass or abnormal calcification. Visualized lung bases are largely clear. No acute osseous abnormality. IMPRESSION: Multiple prominent gas-filled loops of predominantly large bowel within the abdomen. Findings are favored to reflect an ileus, although a distal obstructive process is not entirely excluded. Close interval follow-up recommended if there is high clinical concern for an the evolving obstructive process. Electronically Signed   By: Jeannine Boga  M.D.   On: 05/13/2016 20:31   [place] Guide Roadmapping  Result Date: 05/12/2016 INDICATION: 82 year old male with life-threatening lower GI hemorrhage. CT angiogram demonstrates hemorrhage within the distribution the right colic artery/middle colic artery near the hepatic flexure. EXAM: SELECTIVE VISCERAL ARTERIOGRAPHY; IR EMBO ART VEN HEMORR LYMPH EXTRAV INC GUIDE ROADMAPPING; IR ULTRASOUND GUIDANCE VASC ACCESS RIGHT; ADDITIONAL ARTERIOGRAPHY; ARTERIOGRAPHY MEDICATIONS: 100 mcg nitro, 4 mg IV Zofran. The antibiotic was administered within 1 hour of the procedure ANESTHESIA/SEDATION: Moderate (conscious) sedation was employed during this procedure. A total of Versed 2.0 mg and Fentanyl 100 mcg was administered intravenously. Moderate Sedation Time: 90 minutes. The patient's level of consciousness and vital signs were monitored continuously by radiology nursing throughout the procedure under my direct supervision. CONTRAST:  160 cc Isovue  FLUOROSCOPY TIME:  Fluoroscopy Time: 16 minutes 0 seconds (31.3 mGy). COMPLICATIONS: None PROCEDURE: Informed consent was obtained from the patient following explanation of the procedure, risks, benefits and alternatives. The patient understands, agrees and consents for the procedure. All questions were addressed. A time out was performed prior to the initiation of the procedure. Maximal barrier sterile technique utilized including caps, mask, sterile gowns, sterile gloves, large sterile drape, hand hygiene, and Betadine prep. Ultrasound survey of the right inguinal region was performed with images stored and sent to PACs. A micropuncture needle was used access the right common femoral artery under ultrasound. With excellent arterial blood flow returned, and an .018 micro wire was passed through the needle, observed enter the abdominal aorta under fluoroscopy. The needle was removed, and a micropuncture sheath was placed over the wire. The inner dilator and wire were removed, and an 035 [place] wire was advanced under fluoroscopy into the abdominal aorta. The sheath was removed and a standard 5 [place] vascular sheath was placed. The dilator was removed and the sheath was flushed. Standard 5 French C2 Cobra catheter was advanced over the [place] wire. Cobra catheter was used to select the superior mesenteric artery. Angiogram was performed. C2 Cobra was exchanged over the [place] wire for [place] catheter. Micro catheter system was then used through the [place] catheter with 0.016 micro wire and 135cm [place] micro catheter. Right/middle hepatic artery was then selected as the vasculature perfusing the area of interest. Sub selective angiogram performed of branches of the right/ middle artery. Given the significant tortuosity of the vasculature and small caliber, micro catheter was not able to advance to a distal position. Angiogram demonstrated no evidence of active extravasation. Angio dysplasia with venous  shunting was observed. Single Gel-Foam pledget infused into target artery of interest, and then withdrawal of the catheter in repeat angiogram demonstrated small additional dysplastic branches at the hepatic flexure. A course Gel-Foam slurry was then infused with no significant filling at the completion. Micro catheter was flushed, withdrawn into the superior mesenteric artery, and then angiogram of the collateral vasculature to the GDA was performed. No perfusion of the areae of interest observed. Limited angiogram of the right common femoral artery performed. Exoseal was deployed for closure. Patient tolerated the procedure well and remained hemodynamically stable throughout. No complications were encountered and no significant blood loss encountered. FINDINGS: Ultrasound survey demonstrates mild atherosclerotic changes of the right common femoral artery which is widely patent. Superior mesenteric artery angiogram demonstrates patent superior mesenteric artery, patent ileal colic artery, patent arcades to the small bowel vasculature. 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 Splenic artery patent, left gastric artery patent, common hepatic artery patent, left hepatic artery and right hepatic artery patent. Large collateral vasculature contributed to the origin of gastroepiploic artery. Perfusion of the hepatic flexure was predominantly via right colic/middle colic branch. Sub selective angiogram of the colic branches demonstrated no active extravasation or pseudoaneurysm. The knee shunting observed with angio dysplasia. Given the tortuosity and the inability to place the tip of the micro catheter within distal branches, no proximal coil was deployed. Instead, course Gel-Foam embolization of the territory was elected as treatment to  decrease the pressure to the territory. Puncture of the right common femoral artery. 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 Tortuosity and small vasculature precluded a distal catheter position into the territory for deposition of metallic coils. Deployment of Exoseal for hemostasis. Signed, Dulcy Fanny. Earleen Newport, DO [facility] Electronically Signed   By: Corrie Mckusick D.O.   On: 05/12/2016 08:22   Ct Angio Abd/pel W And/or Wo Contrast  Result Date: 05/11/2016 CLINICAL DATA:  Acute onset of syncope and bright red rectal bleeding. Lightheadedness and generalized weakness. Initial encounter. EXAM: CTA ABDOMEN AND PELVIS wITHOUT AND WITH CONTRAST TECHNIQUE: Multidetector CT imaging of the abdomen and pelvis was performed using the standard protocol during bolus administration of intravenous contrast. Multiplanar reconstructed images and MIPs were obtained and reviewed to evaluate the vascular anatomy. CONTRAST:  75 mL of Isovue 370 IV contrast COMPARISON:  None. FINDINGS: VASCULAR Aorta: Scattered calcification is seen along the abdominal aorta, without significant luminal narrowing. 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 SMA: The superior mesenteric artery appears patent, without evidence of luminal narrowing. Renals: Mild calcification is noted at the proximal renal arteries bilaterally, without significant luminal narrowing. IMA: The inferior mesenteric artery remains patent. Inflow: Scattered calcification is seen along the common and internal iliac arteries. The external iliac arteries and common femoral arteries remain fully patent bilaterally, with mild calcification at the right common femoral artery. Proximal Outflow: Minimal mural  thrombus is suggested at the proximal left superficial femoral artery. Minimal mural thrombus is suggested at the proximal right profunda femoris artery. Veins: Visualized venous structures are grossly unremarkable. The inferior vena cava is partially decompressed and unremarkable in appearance. Review of the MIP images confirms the above findings. NON-VASCULAR Lower chest: Mild bibasilar atelectasis or scarring is noted. Diffuse coronary artery calcifications are seen. The visualized portions of the mediastinum are otherwise unremarkable. Hepatobiliary: The liver is unremarkable in appearance. The gallbladder is unremarkable in appearance. The common bile duct remains normal in caliber. Pancreas: The pancreas is within normal limits. Spleen: The spleen is unremarkable in appearance. Adrenals/Urinary Tract: The adrenal glands are unremarkable in appearance. A large 7.5 cm cyst is noted at the upper pole of the left kidney. Mild nonspecific perinephric stranding is noted bilaterally. Additional scattered small bilateral renal cysts are seen. Mild left-sided renal pelvicaliectasis remains within normal limits, without evidence of significant hydronephrosis. No renal or ureteral stones are identified. Stomach/Bowel: The stomach is unremarkable in appearance. The small bowel is within normal limits. The appendix is not visualized; there is no evidence for appendicitis. Scattered diverticulosis is noted along the proximal sigmoid colon, without evidence of diverticulitis. There is a large amount of acute extravasation of contrast  at the ascending colon, just proximal to the hepatic flexure of the colon, compatible with active intraluminal hemorrhage. This explains the patient's bright red blood per rectum. Lymphatic: Retroperitoneal nodes are grossly unremarkable in appearance. No pelvic sidewall lymphadenopathy is appreciated. Reproductive: The bladder is mildly distended and grossly unremarkable. The prostate remains  normal in size. Other: No additional soft tissue abnormalities are seen. Musculoskeletal: No acute osseous abnormalities are identified. Vacuum phenomenon is noted at L5-S1. Underlying facet disease is noted. The visualized musculature is unremarkable in appearance. IMPRESSION: VASCULAR 1. Large amount of acute extravasation of contrast at the ascending colon, just proximal to the hepatic flexure of the colon, compatible with active intraluminal hemorrhage. This explains the patient's bright red blood per rectum. 2. Scattered aortic atherosclerosis noted. 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 3. Diffuse coronary artery calcifications seen. NON-VASCULAR 1. Mild bibasilar atelectasis or scarring noted. 2. Scattered bilateral renal cysts, measuring up to 7.5 cm on the left. 3. Scattered diverticulosis along the proximal sigmoid colon, without evidence of diverticulitis. These results were called by telephone at the time of interpretation on 05/11/2016 at 9:58 pm to Dr. Carrie Mew, who verbally acknowledged these results. Electronically Signed   By: Garald Balding M.D.   On: 05/11/2016 22:04    DISCHARGE EXAMINATION: Vitals:   05/18/16 2132 05/18/16 2315 05/19/16 0459 05/19/16 0919  BP: (!) 169/69 (!) 157/58 (!) 149/63 (!) 151/63  Pulse: 91 69 73 69  Resp: 18  18   Temp: 98.2 F (36.8 C)  98.5 F (36.9 C)   TempSrc: Oral  Oral   SpO2: 91%  92%   Weight:   61.7 kg (136 lb 0.4 oz)   Height:       General appearance: alert, cooperative, appears stated age and no distress Resp: Coarse breath sounds bilaterally. Improved crackles. No wheezing. No rhonchi. Cardio: regular rate and rhythm, S1, S2 normal, no murmur, click, rub or gallop GI: soft, non-tender; bowel sounds normal; no masses,  no organomegaly Extremities: extremities normal, atraumatic, no cyanosis or edema  DISPOSITION: SNF  Discharge Instructions    Call MD for:   difficulty breathing, headache or visual disturbances    Complete by:  As directed    Call MD for:  extreme fatigue    Complete by:  As directed    Call MD for:  persistant dizziness or light-headedness    Complete by:  As directed    Call MD for:  persistant nausea and vomiting    Complete by:  As directed    Call MD for:  severe uncontrolled pain    Complete by:  As directed    Call MD for:  temperature >100.4    Complete by:  As directed    Discharge instructions    Complete by:  As directed    CBC and basic metabolic panel on Friday and then weekly for 4 weeks. Swallow evaluation at the skilled nursing facility. Seek attention if the bleeding recurs.  You were cared for by a hospitalist during your hospital stay. If you have any questions about your discharge medications or the care you received while you were in the hospital after you are discharged, you can call the unit and asked to speak with the hospitalist on call if the hospitalist that took care of you is not available. Once you are discharged, your primary care physician will handle any further medical issues. Please note that NO REFILLS for  any discharge medications will be authorized once you are discharged, as it is imperative that you return to your primary care physician (or establish a relationship with a primary care physician if you do not have one) for your aftercare needs so that they can reassess your need for medications and monitor your lab values. If you do not have a primary care physician, you can call [phone] for a physician referral.   Increase activity slowly    Complete by:  As directed       ALLERGIES: No Known Allergies   Current Discharge Medication List    START taking these medications   Details  alum & mag hydroxide-simeth (MAALOX/MYLANTA) 200-200-20 MG/5ML suspension Take 15 mLs by mouth every 4 (four) hours as needed for indigestion or heartburn. Qty: 355 mL, Refills: 0    amLODipine (NORVASC)  5 MG tablet Take 1 tablet (5 mg total) by mouth daily. Qty: 30 tablet, Refills: 0    amoxicillin-clavulanate (AUGMENTIN) 875-125 MG tablet Take 1 tablet by mouth every 12 (twelve) hours. Qty: 10 tablet, Refills: 0    feeding supplement, ENSURE ENLIVE, (ENSURE ENLIVE) LIQD Take 237 mLs by mouth 2 (two) times daily between meals. Qty: 237 mL, Refills: 12    Maltodextrin-Xanthan Gum (RESOURCE THICKENUP CLEAR) POWD As needed    metoprolol tartrate (LOPRESSOR) 25 MG tablet Take 1 tablet (25 mg total) by mouth 2 (two) times daily.    thiamine 100 MG tablet Take 1 tablet (100 mg total) by mouth daily.      STOP taking these medications     aspirin EC 81 MG tablet          Follow-up Information    [facility] Schedule an appointment as soon as possible for a visit in 1 week(s).   Contact information: [place] [id number] [phone]           TOTAL DISCHARGE TIME: 35 minutes  [facility]  [facility] Pager [phone]  05/19/2016, 10:53 AM

## 2016-05-19 NOTE — Care Management Note (Signed)
Case Management Note  Patient Details  Name: Dustin Howell MRN: [id number] Date of Birth: 1924/12/27  Subjective/Objective:                 Patient from home with GIB. Lives with wife.   Action/Plan:  DC today to SNF as facilitated by CSW.   Expected Discharge Date:                  Expected Discharge Plan:  Skilled Nursing Facility  In-House Referral:  Clinical Social Work  Discharge planning Services  CM Consult  Post Acute Care Choice:    Choice offered to:     DME Arranged:    DME Agency:     HH Arranged:    [place] Agency:     Status of Service:  Completed, signed off  If discussed at [employer], dates discussed:    Additional Comments:  Carles Collet, RN 05/19/2016, 10:11 AM

## 2016-05-22 DIAGNOSIS — M6281 Muscle weakness (generalized): Secondary | ICD-10-CM | POA: Diagnosis not present

## 2016-05-22 DIAGNOSIS — D696 Thrombocytopenia, unspecified: Secondary | ICD-10-CM | POA: Diagnosis present

## 2016-05-22 LAB — BASIC METABOLIC PANEL
ANION GAP: 5 (ref 5–15)
BUN: 17 mg/dL (ref 6–20)
CHLORIDE: 110 mmol/L (ref 101–111)
CO2: 24 mmol/L (ref 22–32)
Calcium: 8.2 mg/dL — ABNORMAL LOW (ref 8.9–10.3)
Creatinine, Ser: 0.83 mg/dL (ref 0.61–1.24)
GFR calc non Af Amer: >60 mL/min (ref >60)
GLUCOSE: 86 mg/dL (ref 65–99)
Potassium: 4 mmol/L (ref 3.5–5.1)
Sodium: 139 mmol/L (ref 135–145)

## 2016-05-22 LAB — CBC
HEMATOCRIT: 30.4 % — AB (ref 40.0–52.0)
HEMOGLOBIN: 10.1 g/dL — AB (ref 13.0–18.0)
MCH: 28.4 pg (ref 26.0–34.0)
MCHC: 33.1 g/dL (ref 32.0–36.0)
MCV: 85.7 fL (ref 80.0–100.0)
Platelets: 122 10*3/uL — ABNORMAL LOW (ref 150–440)
RBC: 3.54 MIL/uL — ABNORMAL LOW (ref 4.40–5.90)
RDW: 18.4 % — ABNORMAL HIGH (ref 11.5–14.5)
WBC: 8.2 10*3/uL (ref 3.8–10.6)

## 2016-05-28 ENCOUNTER — Non-Acute Institutional Stay (SKILLED_NURSING_FACILITY): Payer: Medicare Other | Admitting: Gerontology

## 2016-05-28 DIAGNOSIS — D696 Thrombocytopenia, unspecified: Secondary | ICD-10-CM | POA: Diagnosis not present

## 2016-05-28 DIAGNOSIS — K2900 Acute gastritis without bleeding: Secondary | ICD-10-CM | POA: Diagnosis not present

## 2016-05-28 LAB — BASIC METABOLIC PANEL
Anion gap: 9 (ref 5–15)
BUN: 29 mg/dL — AB (ref 6–20)
CHLORIDE: 105 mmol/L (ref 101–111)
CO2: 26 mmol/L (ref 22–32)
CREATININE: 1.02 mg/dL (ref 0.61–1.24)
Calcium: 8.8 mg/dL — ABNORMAL LOW (ref 8.9–10.3)
GFR calc Af Amer: >60 mL/min (ref >60)
GFR calc non Af Amer: >60 mL/min (ref >60)
Glucose, Bld: 120 mg/dL — ABNORMAL HIGH (ref 65–99)
Potassium: 4.2 mmol/L (ref 3.5–5.1)
SODIUM: 140 mmol/L (ref 135–145)

## 2016-05-28 LAB — CBC WITH DIFFERENTIAL/PLATELET
Basophils Absolute: 0.1 10*3/uL (ref 0–0.1)
Basophils Relative: 1 %
EOS ABS: 0 10*3/uL (ref 0–0.7)
EOS PCT: 0 %
HCT: 34.9 % — ABNORMAL LOW (ref 40.0–52.0)
HEMOGLOBIN: 11.4 g/dL — AB (ref 13.0–18.0)
LYMPHS ABS: 0.6 10*3/uL — AB (ref 1.0–3.6)
Lymphocytes Relative: 6 %
MCH: 27.7 pg (ref 26.0–34.0)
MCHC: 32.6 g/dL (ref 32.0–36.0)
MCV: 85 fL (ref 80.0–100.0)
MONO ABS: 1.8 10*3/uL — AB (ref 0.2–1.0)
MONOS PCT: 20 %
Neutro Abs: 6.9 10*3/uL — ABNORMAL HIGH (ref 1.4–6.5)
Neutrophils Relative %: 73 %
PLATELETS: 199 10*3/uL (ref 150–440)
RBC: 4.1 MIL/uL — ABNORMAL LOW (ref 4.40–5.90)
RDW: 18 % — ABNORMAL HIGH (ref 11.5–14.5)
WBC: 9.4 10*3/uL (ref 3.8–10.6)

## 2016-06-01 ENCOUNTER — Non-Acute Institutional Stay (SKILLED_NURSING_FACILITY): Payer: Medicare Other | Admitting: Gerontology

## 2016-06-01 DIAGNOSIS — K922 Gastrointestinal hemorrhage, unspecified: Secondary | ICD-10-CM

## 2016-06-01 DIAGNOSIS — K2900 Acute gastritis without bleeding: Secondary | ICD-10-CM | POA: Diagnosis not present

## 2016-06-01 DIAGNOSIS — D62 Acute posthemorrhagic anemia: Secondary | ICD-10-CM

## 2016-06-01 NOTE — Progress Notes (Signed)
Location:      Place of Service:  SNF (31)  Provider: Toni Arthurs, NP-C  PCP: [facility] Patient Care Team: Katheren Shams as PCP - General  Extended Emergency Contact Information Primary Emergency Contact: [place] Phone: [phone] Relation: Daughter Secondary Emergency Contact: Cameron [place] Phone: [phone] Relation: Son  Code Status: dnr Goals of care:  Advanced Directive information Advanced Directives 05/11/2016  Does patient have an advance directive? No     No Known Allergies  Chief Complaint  Patient presents with  . Discharge Note    HPI:  82 y.o. male was seen today for discharge evaluation. Pt was admitted to the facility for rehab following hospitalization for GI bleed and acute blood loss anemia. Pt underwent PT and OT, has progressed well. Pt did have issues with nausea and vomiting. He was given fluids for hydration. Nausea improved. IV and PO antiemetics given. Appetite poor. Was started on appetite stimulant. Pt says he just doesn't like the food; refusing supplements. Weakness improved, Hgb remained stable. VSS. No signs of melena, hematuria, hematemesis. No other complaints. Pt says he is ready for d/c.    Past Medical History:  Diagnosis Date  . GI bleed   . HOH (hard of hearing)     Past Surgical History:  Procedure Laterality Date  . IR GENERIC HISTORICAL  05/12/2016   IR EMBO ART  VEN HEMORR LYMPH EXTRAV  INC GUIDE ROADMAPPING 05/12/2016 Corrie Mckusick, DO [facility]  . IR GENERIC HISTORICAL  05/12/2016   IR ANGIOGRAM VISCERAL SELECTIVE 05/12/2016 Corrie Mckusick, DO [facility]  . IR GENERIC HISTORICAL  05/12/2016   IR US GUIDE VASC ACCESS RIGHT 05/12/2016 Corrie Mckusick, DO [facility]  . IR GENERIC HISTORICAL  05/12/2016   IR ANGIOGRAM SELECTIVE EACH ADDITIONAL VESSEL 05/12/2016 Corrie Mckusick, DO [facility]  . IR GENERIC HISTORICAL  05/12/2016   IR ANGIOGRAM SELECTIVE EACH ADDITIONAL VESSEL 05/12/2016 Corrie Mckusick, DO [facility]  . IR GENERIC HISTORICAL  05/12/2016   IR ANGIOGRAM FOLLOW UP STUDY 05/12/2016 Corrie Mckusick, DO [facility]  . IR GENERIC HISTORICAL  05/12/2016   IR ANGIOGRAM SELECTIVE EACH ADDITIONAL VESSEL 05/12/2016 Corrie Mckusick, DO [facility]      reports that he has never smoked. He has never used smokeless tobacco. He reports that he drinks alcohol. His drug history is not on file. Social History   Social History  . Marital status: Married    Spouse name: N/A  . Number of children: N/A  . Years of education: N/A   Occupational History  . Not on file.   Social History Main Topics  . Smoking status: Never Smoker  . Smokeless tobacco: Never Used  . Alcohol use Yes     Comment: every evening with supper  . Drug use: Unknown  . Sexual activity: Not on file   Other Topics Concern  . Not on file   Social History Narrative  . No narrative on file   Functional Status Survey:    No Known Allergies  There are no preventive care reminders to display for this patient.  Medications:   Medication List       Accurate as of 06/01/16  9:43 PM. Always use your most recent med list.          alum & mag hydroxide-simeth 200-200-20 MG/5ML suspension Commonly known as:  MAALOX/MYLANTA Take 15 mLs by mouth every 4 (four) hours as needed  for indigestion or heartburn.   amLODipine 5 MG tablet Commonly known as:  NORVASC Take 1 tablet (5 mg total) by mouth daily.   feeding supplement (ENSURE ENLIVE) Liqd Take 237 mLs by mouth 2 (two) times daily between meals.   metoprolol tartrate 25 MG tablet Commonly known as:  LOPRESSOR Take 1 tablet (25 mg total) by mouth 2 (two) times daily.   RESOURCE THICKENUP CLEAR Powd As needed   thiamine 100 MG tablet Take 1 tablet (100 mg total) by mouth daily.       Review of Systems  Constitutional: Negative for activity change, appetite change, chills,  diaphoresis and fever.  HENT: Negative.   Eyes: Negative.   Respiratory: Negative for apnea, cough, choking, chest tightness, shortness of breath and wheezing.   Cardiovascular: Negative for chest pain, palpitations and leg swelling.  Gastrointestinal: Positive for constipation, nausea and vomiting. Negative for abdominal distention, abdominal pain, anal bleeding, blood in stool, diarrhea and rectal pain.  Genitourinary: Negative for difficulty urinating, dysuria, frequency and urgency.  Musculoskeletal: Positive for arthralgias (typical arthritis). Negative for back pain, gait problem and myalgias.  Skin: Negative for color change, pallor, rash and wound.  Neurological: Negative for dizziness, tremors, syncope, speech difficulty, weakness, numbness and headaches.  Psychiatric/Behavioral: Negative.   All other systems reviewed and are negative.   Vitals:   06/01/16 0630  BP: (!) 149/72  Pulse: 77  Resp: 18  Temp: 97.8 F (36.6 C)  SpO2: 98%   There is no height or weight on file to calculate BMI. Physical Exam  Constitutional: He is oriented to person, place, and time. Vital signs are normal. He appears well-developed and well-nourished. He is active and cooperative. He does not appear ill. No distress.  HENT:  Head: Normocephalic and atraumatic.  Mouth/Throat: Uvula is midline and oropharynx is clear and moist. Mucous membranes are not pale, dry and not cyanotic.  Eyes: Conjunctivae, EOM and lids are normal. Pupils are equal, round, and reactive to light.  Neck: Trachea normal, normal range of motion and full passive range of motion without pain. Neck supple. No JVD present. No tracheal deviation, no edema and no erythema present. No thyromegaly present.  Cardiovascular: Normal rate, regular rhythm, normal heart sounds, intact distal pulses and normal pulses.  Exam reveals no gallop, no distant heart sounds and no friction rub.   No murmur heard. Pulmonary/Chest: Effort normal and  breath sounds normal. No accessory muscle usage. No respiratory distress. He has no wheezes. He has no rales. He exhibits no tenderness.  Abdominal: Soft. Normal appearance. He exhibits no distension, no fluid wave, no ascites and no mass. Bowel sounds are decreased. There is no tenderness.  Musculoskeletal: Normal range of motion. He exhibits no edema or tenderness.  Expected osteoarthritis, stiffness  Neurological: He is alert and oriented to person, place, and time. He has normal strength.  Skin: Skin is warm, dry and intact. He is not diaphoretic. No cyanosis. No pallor. Nails show no clubbing.  Psychiatric: He has a normal mood and affect. His speech is normal and behavior is normal. Judgment and thought content normal. Cognition and memory are normal.  Nursing note and vitals reviewed.   Labs reviewed: Basic Metabolic Panel:  Recent Labs  05/12/16 0355 05/12/16 1057 05/13/16 0222  05/15/16 1218  05/19/16 0832 05/22/16 0730 05/28/16 0600  NA 143  --  141  < >  --   < > 141 139 140  K 4.8  --  3.9  < >  --   < >  4.1 4.0 4.2  CL 119*  --  117*  < >  --   < > 111 110 105  CO2 20*  --  15*  < >  --   < > 24 24 26   GLUCOSE 141*  --  137*  < >  --   < > 85 86 120*  BUN 22*  --  38*  < >  --   < > 12 17 29*  CREATININE 1.21  --  1.95*  < >  --   < > 0.97 0.83 1.02  CALCIUM 7.4*  --  7.9*  < >  --   < > 7.9* 8.2* 8.8*  MG 1.6*  --  2.3  --  1.9  --   --   --   --   PHOS 4.2 5.1*  --   --   --   --   --   --   --   < > = values in this interval not displayed. Liver Function Tests:  Recent Labs  05/11/16 1741 05/14/16 0251  AST 22 66*  ALT 9* 23  ALKPHOS 48 56  BILITOT 0.5 1.1  PROT 5.1* 5.0*  ALBUMIN 3.2* 2.7*    Recent Labs  05/11/16 1741  LIPASE 37   No results for input(s): AMMONIA in the last 8760 hours. CBC:  Recent Labs  05/11/16 1741  05/19/16 0832 05/22/16 0730 05/28/16 0600  WBC 16.5*  < > 8.8 8.2 9.4  NEUTROABS 5.6  --   --   --  6.9*  HGB 7.8*  <  > 9.1* 10.1* 11.4*  HCT 23.6*  < > 28.0* 30.4* 34.9*  MCV 93.5  < > 85.9 85.7 85.0  PLT 114*  < > 87* 122* 199  < > = values in this interval not displayed. Cardiac Enzymes:  Recent Labs  05/12/16 1057 05/12/16 1607 05/12/16 2238  TROPONINI 0.05* 0.06* 0.11*   BNP: Invalid input(s): POCBNP CBG:  Recent Labs  05/11/16 2244  GLUCAP 143*    Procedures and Imaging Studies During Stay: Dg Abd 1 View  Result Date: 05/14/2016 CLINICAL DATA:  Abdominal pain and gas pain. EXAM: ABDOMEN - 1 VIEW COMPARISON:  05/13/2016 FINDINGS: Again noted are gas-filled loops of bowel throughout the abdomen. The most dilated loops of bowel appear to represent colon. There is some small bowel gas distension. Limited evaluation for free air on this single supine image. IMPRESSION: Gas-filled loops of bowel throughout the abdomen. Minimal change from the previous examination and findings may represent an ileus. Electronically Signed   By: Markus Daft M.D.   On: 05/14/2016 10:51   Ir Angiogram Visceral Selective  Result Date: 05/12/2016 INDICATION: 82 year old male with life-threatening lower GI hemorrhage. CT angiogram demonstrates hemorrhage within the distribution the right colic artery/middle colic artery near the hepatic flexure. EXAM: SELECTIVE VISCERAL ARTERIOGRAPHY; IR EMBO ART VEN HEMORR LYMPH EXTRAV INC GUIDE ROADMAPPING; IR ULTRASOUND GUIDANCE VASC ACCESS RIGHT; ADDITIONAL ARTERIOGRAPHY; ARTERIOGRAPHY MEDICATIONS: 100 mcg nitro, 4 mg IV Zofran. The antibiotic was administered within 1 hour of the procedure ANESTHESIA/SEDATION: Moderate (conscious) sedation was employed during this procedure. A total of Versed 2.0 mg and Fentanyl 100 mcg was administered intravenously. Moderate Sedation Time: 90 minutes. The patient's level of consciousness and vital signs were monitored continuously by radiology nursing throughout the procedure under my direct supervision. CONTRAST:  160 cc Isovue FLUOROSCOPY TIME:   Fluoroscopy Time: 16 minutes 0 seconds (31.3 mGy). COMPLICATIONS: None PROCEDURE: Informed consent  was obtained from the patient following explanation of the procedure, risks, benefits and alternatives. The patient understands, agrees and consents for the procedure. All questions were addressed. A time out was performed prior to the initiation of the procedure. Maximal barrier sterile technique utilized including caps, mask, sterile gowns, sterile gloves, large sterile drape, hand hygiene, and Betadine prep. Ultrasound survey of the right inguinal region was performed with images stored and sent to PACs. A micropuncture needle was used access the right common femoral artery under ultrasound. With excellent arterial blood flow returned, and an .018 micro wire was passed through the needle, observed enter the abdominal aorta under fluoroscopy. The needle was removed, and a micropuncture sheath was placed over the wire. The inner dilator and wire were removed, and an 035 Bentson wire was advanced under fluoroscopy into the abdominal aorta. The sheath was removed and a standard 5 [place] vascular sheath was placed. The dilator was removed and the sheath was flushed. Standard 5 French C2 Cobra catheter was advanced over the [place] wire. Cobra catheter was used to select the superior mesenteric artery. Angiogram was performed. C2 Cobra was exchanged over the [place] wire for [place] catheter. Micro catheter system was then used through the [place] catheter with 0.016 micro wire and 135cm [place] micro catheter. Right/middle hepatic artery was then selected as the vasculature perfusing the area of interest. Sub selective angiogram performed of branches of the right/ middle artery. Given the significant tortuosity of the vasculature and small caliber, micro catheter was not able to advance to a distal position. Angiogram demonstrated no evidence of active extravasation. 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  Single Gel-Foam pledget infused into target artery of interest, and then withdrawal of the catheter in repeat angiogram demonstrated small additional dysplastic branches at the hepatic flexure. A course Gel-Foam slurry was then infused with no significant filling at the completion. Micro catheter was flushed, withdrawn into the superior mesenteric artery, and then angiogram of the collateral vasculature to the GDA was performed. No perfusion of the areae of interest observed. Limited angiogram of the right common femoral artery performed. Exoseal was deployed for closure. Patient tolerated the procedure well and remained hemodynamically stable throughout. No complications were encountered and no significant blood loss encountered. FINDINGS: Ultrasound survey demonstrates mild atherosclerotic changes of the right common femoral artery which is widely patent. Superior mesenteric artery angiogram demonstrates patent superior mesenteric artery, patent ileal colic artery, patent arcades to the small bowel vasculature. 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 Splenic artery patent, left gastric artery patent, common hepatic artery patent, left hepatic artery and right hepatic artery patent. Large collateral vasculature contributed to the origin of gastroepiploic artery. Perfusion of the hepatic flexure was predominantly via right colic/middle colic branch. Sub selective angiogram of the colic branches demonstrated no active extravasation or pseudoaneurysm. The knee shunting observed with angio dysplasia. Given the tortuosity and the inability to place the tip of the micro catheter within distal branches, no proximal coil was deployed. Instead, course Gel-Foam embolization of the territory was elected as treatment to decrease the pressure to  the territory. Puncture of the right common femoral artery. 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 Tortuosity and small vasculature precluded a distal catheter position into the territory  for deposition of metallic coils. Deployment of Exoseal for hemostasis. Signed, Dulcy Fanny. Earleen Newport, DO [facility] Electronically Signed   By: Corrie Mckusick D.O.   On: 05/12/2016 08:22   Ir Angiogram Selective Each Additional Vessel  Result Date: 05/12/2016 INDICATION: 82 year old male with life-threatening lower GI hemorrhage. CT angiogram demonstrates hemorrhage within the distribution the right colic artery/middle colic artery near the hepatic flexure. EXAM: SELECTIVE VISCERAL ARTERIOGRAPHY; IR EMBO ART VEN HEMORR LYMPH EXTRAV INC GUIDE ROADMAPPING; IR ULTRASOUND GUIDANCE VASC ACCESS RIGHT; ADDITIONAL ARTERIOGRAPHY; ARTERIOGRAPHY MEDICATIONS: 100 mcg nitro, 4 mg IV Zofran. The antibiotic was administered within 1 hour of the procedure ANESTHESIA/SEDATION: Moderate (conscious) sedation was employed during this procedure. A total of Versed 2.0 mg and Fentanyl 100 mcg was administered intravenously. Moderate Sedation Time: 90 minutes. The patient's level of consciousness and vital signs were monitored continuously by radiology nursing throughout the procedure under my direct supervision. CONTRAST:  160 cc Isovue FLUOROSCOPY TIME:  Fluoroscopy Time: 16 minutes 0 seconds (31.3 mGy). COMPLICATIONS: None PROCEDURE: Informed consent was obtained from the patient following explanation of the procedure, risks, benefits and alternatives. The patient understands, agrees and consents for the procedure. All questions were addressed. A time out was performed prior to the initiation of the procedure. Maximal barrier sterile technique utilized  including caps, mask, sterile gowns, sterile gloves, large sterile drape, hand hygiene, and Betadine prep. Ultrasound survey of the right inguinal region was performed with images stored and sent to PACs. A micropuncture needle was used access the right common femoral artery under ultrasound. With excellent arterial blood flow returned, and an .018 micro wire was passed through the needle, observed enter the abdominal aorta under fluoroscopy. The needle was removed, and a micropuncture sheath was placed over the wire. The inner dilator and wire were removed, and an 035 Bentson wire was advanced under fluoroscopy into the abdominal aorta. The sheath was removed and a standard 5 [place] vascular sheath was placed. The dilator was removed and the sheath was flushed. Standard 5 French C2 Cobra catheter was advanced over the [place] wire. Cobra catheter was used to select the superior mesenteric artery. Angiogram was performed. C2 Cobra was exchanged over the [place] wire for [place] catheter. Micro catheter system was then used through the [place] catheter with 0.016 micro wire and 135cm [place] micro catheter. Right/middle hepatic artery was then selected as the vasculature perfusing the area of interest. Sub selective angiogram performed of branches of the right/ middle artery. Given the significant tortuosity of the vasculature and small caliber, micro catheter was not able to advance to a distal position. Angiogram demonstrated no evidence of active extravasation. 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 Single Gel-Foam pledget infused into target artery of interest, and then withdrawal of the catheter in repeat angiogram demonstrated small additional dysplastic branches at the hepatic flexure. A course Gel-Foam slurry was then infused with no significant filling at the completion. Micro catheter was flushed, withdrawn into the superior mesenteric artery, and then angiogram of the collateral vasculature  to the GDA was performed. No perfusion of the areae of interest observed. Limited angiogram of the right common femoral artery performed. Exoseal was deployed for closure. Patient tolerated the procedure well and remained hemodynamically stable throughout. No complications were encountered and no significant blood loss encountered. FINDINGS: Ultrasound survey demonstrates mild atherosclerotic changes of the right common femoral artery which is widely patent. Superior mesenteric artery angiogram demonstrates patent superior mesenteric artery, patent ileal colic  artery, patent arcades to the small bowel vasculature. 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 Splenic artery patent, left gastric artery patent, common hepatic artery patent, left hepatic artery and right hepatic artery patent. Large collateral vasculature contributed to the origin of gastroepiploic artery. Perfusion of the hepatic flexure was predominantly via right colic/middle colic branch. Sub selective angiogram of the colic branches demonstrated no active extravasation or pseudoaneurysm. The knee shunting observed with angio dysplasia. Given the tortuosity and the inability to place the tip of the micro catheter within distal branches, no proximal coil was deployed. Instead, course Gel-Foam embolization of the territory was elected as treatment to decrease the pressure to the territory. Puncture of the right common femoral artery. 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 Tortuosity and small vasculature precluded a distal catheter position into the territory for deposition of metallic coils. Deployment of Exoseal for hemostasis.  Signed, Dulcy Fanny. Earleen Newport, DO [facility] Electronically Signed   By: Corrie Mckusick D.O.   On: 05/12/2016 08:22   Ir Angiogram Selective Each Additional Vessel  Result Date: 05/12/2016 INDICATION: 82 year old male with life-threatening lower GI hemorrhage. CT angiogram demonstrates hemorrhage within the distribution the right colic artery/middle colic artery near the hepatic flexure. EXAM: SELECTIVE VISCERAL ARTERIOGRAPHY; IR EMBO ART VEN HEMORR LYMPH EXTRAV INC GUIDE ROADMAPPING; IR ULTRASOUND GUIDANCE VASC ACCESS RIGHT; ADDITIONAL ARTERIOGRAPHY; ARTERIOGRAPHY MEDICATIONS: 100 mcg nitro, 4 mg IV Zofran. The antibiotic was administered within 1 hour of the procedure ANESTHESIA/SEDATION: Moderate (conscious) sedation was employed during this procedure. A total of Versed 2.0 mg and Fentanyl 100 mcg was administered intravenously. Moderate Sedation Time: 90 minutes. The patient's level of consciousness and vital signs were monitored continuously by radiology nursing throughout the procedure under my direct supervision. CONTRAST:  160 cc Isovue FLUOROSCOPY TIME:  Fluoroscopy Time: 16 minutes 0 seconds (31.3 mGy). COMPLICATIONS: None PROCEDURE: Informed consent was obtained from the patient following explanation of the procedure, risks, benefits and alternatives. The patient understands, agrees and consents for the procedure. All questions were addressed. A time out was performed prior to the initiation of the procedure. Maximal barrier sterile technique utilized including caps, mask, sterile gowns, sterile gloves, large sterile drape, hand hygiene, and Betadine prep. Ultrasound survey of the right inguinal region was performed with images stored and sent to PACs. A micropuncture needle was used access the right common femoral artery under ultrasound. With excellent arterial blood flow returned, and an .018 micro wire was passed through the needle, observed  enter the abdominal aorta under fluoroscopy. The needle was removed, and a micropuncture sheath was placed over the wire. The inner dilator and wire were removed, and an 035 Bentson wire was advanced under fluoroscopy into the abdominal aorta. The sheath was removed and a standard 5 [place] vascular sheath was placed. The dilator was removed and the sheath was flushed. Standard 5 French C2 Cobra catheter was advanced over the [place] wire. Cobra catheter was used to select the superior mesenteric artery. Angiogram was performed. C2 Cobra was exchanged over the B[place] wire for [place] catheter. Micro catheter system was then used through the [place] catheter with 0.016 micro wire and 135cm [place] micro catheter. Right/middle hepatic artery was then selected as the vasculature perfusing the  area of interest. Sub selective angiogram performed of branches of the right/ middle artery. Given the significant tortuosity of the vasculature and small caliber, micro catheter was not able to advance to a distal position. Angiogram demonstrated no evidence of active extravasation. 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 Single Gel-Foam pledget infused into target artery of interest, and then withdrawal of the catheter in repeat angiogram demonstrated small additional dysplastic branches at the hepatic flexure. A course Gel-Foam slurry was then infused with no significant filling at the completion. Micro catheter was flushed, withdrawn into the superior mesenteric artery, and then angiogram of the collateral vasculature to the GDA was performed. No perfusion of the areae of interest observed. Limited angiogram of the right common femoral artery performed. Exoseal was deployed for closure. Patient tolerated the procedure well and remained hemodynamically stable throughout. No complications were encountered and no significant blood loss encountered. FINDINGS: Ultrasound survey demonstrates mild atherosclerotic  changes of the right common femoral artery which is widely patent. Superior mesenteric artery angiogram demonstrates patent superior mesenteric artery, patent ileal colic artery, patent arcades to the small bowel vasculature. 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 Splenic artery patent, left gastric artery patent, common hepatic artery patent, left hepatic artery and right hepatic artery patent. Large collateral vasculature contributed to the origin of gastroepiploic artery. Perfusion of the hepatic flexure was predominantly via right colic/middle colic branch. Sub selective angiogram of the colic branches demonstrated no active extravasation or pseudoaneurysm. The knee shunting observed with angio dysplasia. Given the tortuosity and the inability to place the tip of the micro catheter within distal branches, no proximal coil was deployed. Instead, course Gel-Foam embolization of the territory was elected as treatment to decrease the pressure to the territory. Puncture of the right common femoral artery. 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 Tortuosity and small vasculature precluded a distal catheter position into the territory for deposition of metallic coils. Deployment of Exoseal for hemostasis. Signed, Dulcy Fanny. Earleen Newport, DO [facility] Radiology Electronically Signed   By: Corrie Mckusick D.O.   On: 05/12/2016 08:22   Ir Angiogram Selective Each Additional Vessel  Result Date: 05/12/2016 INDICATION: 82 year old male with life-threatening lower GI hemorrhage. CT angiogram demonstrates hemorrhage within the distribution the right colic  artery/middle colic artery near the hepatic flexure. EXAM: SELECTIVE VISCERAL ARTERIOGRAPHY; IR EMBO ART VEN HEMORR LYMPH EXTRAV INC GUIDE ROADMAPPING; IR ULTRASOUND GUIDANCE VASC ACCESS RIGHT; ADDITIONAL ARTERIOGRAPHY; ARTERIOGRAPHY MEDICATIONS: 100 mcg nitro, 4 mg IV Zofran. The antibiotic was administered within 1 hour of the procedure ANESTHESIA/SEDATION: Moderate (conscious) sedation was employed during this procedure. A total of Versed 2.0 mg and Fentanyl 100 mcg was administered intravenously. Moderate Sedation Time: 90 minutes. The patient's level of consciousness and vital signs were monitored continuously by radiology nursing throughout the procedure under my direct supervision. CONTRAST:  160 cc Isovue FLUOROSCOPY TIME:  Fluoroscopy Time: 16 minutes 0 seconds (31.3 mGy). COMPLICATIONS: None PROCEDURE: Informed consent was obtained from the patient following explanation of the procedure, risks, benefits and alternatives. The patient understands, agrees and consents for the procedure. All questions were addressed. A time out was performed prior to the initiation of the procedure. Maximal barrier sterile technique utilized including caps, mask, sterile gowns,  sterile gloves, large sterile drape, hand hygiene, and Betadine prep. Ultrasound survey of the right inguinal region was performed with images stored and sent to PACs. A micropuncture needle was used access the right common femoral artery under ultrasound. With excellent arterial blood flow returned, and an .018 micro wire was passed through the needle, observed enter the abdominal aorta under fluoroscopy. The needle was removed, and a micropuncture sheath was placed over the wire. The inner dilator and wire were removed, and an 035 Bentson wire was advanced under fluoroscopy into the abdominal aorta. The sheath was removed and a standard 5 [place] vascular sheath was placed. The dilator was removed and the sheath was flushed. Standard 5 French C2 Cobra  catheter was advanced over the [place] wire. Cobra catheter was used to select the superior mesenteric artery. Angiogram was performed. C2 Cobra was exchanged over the [place] wire for [place] catheter. Micro catheter system was then used through the [place] catheter with 0.016 micro wire and 135cm Lan[place] micro catheter. Right/middle hepatic artery was then selected as the vasculature perfusing the area of interest. Sub selective angiogram performed of branches of the right/ middle artery. Given the significant tortuosity of the vasculature and small caliber, micro catheter was not able to advance to a distal position. Angiogram demonstrated no evidence of active extravasation. 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 Single Gel-Foam pledget infused into target artery of interest, and then withdrawal of the catheter in repeat angiogram demonstrated small additional dysplastic branches at the hepatic flexure. A course Gel-Foam slurry was then infused with no significant filling at the completion. Micro catheter was flushed, withdrawn into the superior mesenteric artery, and then angiogram of the collateral vasculature to the GDA was performed. No perfusion of the areae of interest observed. Limited angiogram of the right common femoral artery performed. Exoseal was deployed for closure. Patient tolerated the procedure well and remained hemodynamically stable throughout. No complications were encountered and no significant blood loss encountered. FINDINGS: Ultrasound survey demonstrates mild atherosclerotic changes of the right common femoral artery which is widely patent. Superior mesenteric artery angiogram demonstrates patent superior mesenteric artery, patent ileal colic artery, patent arcades to the small bowel vasculature. 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 Splenic artery patent, left gastric artery patent, common hepatic artery patent, left hepatic artery and right hepatic artery patent. Large collateral vasculature contributed to the origin of gastroepiploic artery. Perfusion of the hepatic flexure was predominantly via right colic/middle colic branch. Sub selective angiogram of the colic branches demonstrated no active extravasation or pseudoaneurysm. The knee shunting observed with angio dysplasia. Given the tortuosity and the inability to place the tip of the micro catheter within distal branches, no proximal coil was deployed. Instead, course Gel-Foam embolization of the territory was elected as treatment to decrease the pressure to the territory. Puncture of the right common femoral artery. 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 Tortuosity and small vasculature precluded a distal catheter position into the territory for deposition of metallic coils. Deployment of Exoseal for hemostasis. Signed, Dulcy Fanny. Earleen Newport, DO [facility] Radiology Electronically Signed   By: Corrie Mckusick D.O.   On: 05/12/2016 08:22   Ir Angiogram Follow Up Study  Result Date: 05/12/2016 INDICATION: 82 year old  male with life-threatening lower GI hemorrhage. CT angiogram demonstrates hemorrhage within the distribution the right colic artery/middle colic artery near the hepatic flexure. EXAM: SELECTIVE VISCERAL ARTERIOGRAPHY; IR EMBO ART VEN HEMORR LYMPH EXTRAV INC GUIDE ROADMAPPING; IR ULTRASOUND GUIDANCE VASC ACCESS RIGHT; ADDITIONAL ARTERIOGRAPHY; ARTERIOGRAPHY MEDICATIONS: 100 mcg nitro, 4 mg IV Zofran. The antibiotic was administered within 1 hour of the procedure ANESTHESIA/SEDATION: Moderate (conscious) sedation was employed during this  procedure. A total of Versed 2.0 mg and Fentanyl 100 mcg was administered intravenously. Moderate Sedation Time: 90 minutes. The patient's level of consciousness and vital signs were monitored continuously by radiology nursing throughout the procedure under my direct supervision. CONTRAST:  160 cc Isovue FLUOROSCOPY TIME:  Fluoroscopy Time: 16 minutes 0 seconds (31.3 mGy). COMPLICATIONS: None PROCEDURE: Informed consent was obtained from the patient following explanation of the procedure, risks, benefits and alternatives. The patient understands, agrees and consents for the procedure. All questions were addressed. A time out was performed prior to the initiation of the procedure. Maximal barrier sterile technique utilized including caps, mask, sterile gowns, sterile gloves, large sterile drape, hand hygiene, and Betadine prep. Ultrasound survey of the right inguinal region was performed with images stored and sent to PACs. A micropuncture needle was used access the right common femoral artery under ultrasound. With excellent arterial blood flow returned, and an .018 micro wire was passed through the needle, observed enter the abdominal aorta under fluoroscopy. The needle was removed, and a micropuncture sheath was placed over the wire. The inner dilator and wire were removed, and an 035 Bentson wire was advanced under fluoroscopy into the abdominal aorta. The sheath was removed and a standard 5 [place] vascular sheath was placed. The dilator was removed and the sheath was flushed. Standard 5 French C2 Cobra catheter was advanced over the [place] wire. Cobra catheter was used to select the superior mesenteric artery. Angiogram was performed. C2 Cobra was exchanged over the [place] wire for [place] catheter. Micro catheter system was then used through the [place] catheter with 0.016 micro wire and 135cm [place] micro catheter. Right/middle hepatic artery was then selected as the vasculature perfusing the area of  interest. Sub selective angiogram performed of branches of the right/ middle artery. Given the significant tortuosity of the vasculature and small caliber, micro catheter was not able to advance to a distal position. Angiogram demonstrated no evidence of active extravasation. 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 Single Gel-Foam pledget infused into target artery of interest, and then withdrawal of the catheter in repeat angiogram demonstrated small additional dysplastic branches at the hepatic flexure. A course Gel-Foam slurry was then infused with no significant filling at the completion. Micro catheter was flushed, withdrawn into the superior mesenteric artery, and then angiogram of the collateral vasculature to the GDA was performed. No perfusion of the areae of interest observed. Limited angiogram of the right common femoral artery performed. Exoseal was deployed for closure. Patient tolerated the procedure well and remained hemodynamically stable throughout. No complications were encountered and no significant blood loss encountered. FINDINGS: Ultrasound survey demonstrates mild atherosclerotic changes of the right common femoral artery which is widely patent. Superior mesenteric artery angiogram demonstrates patent superior mesenteric artery, patent ileal colic artery, patent arcades to the small bowel vasculature. 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 Splenic artery patent, left gastric artery patent, common hepatic artery  patent, left hepatic artery and right hepatic artery patent. Large collateral vasculature contributed to the origin of gastroepiploic artery. Perfusion of the hepatic flexure was predominantly via right colic/middle colic branch. Sub selective angiogram of the colic branches demonstrated no  active extravasation or pseudoaneurysm. The knee shunting observed with angio dysplasia. Given the tortuosity and the inability to place the tip of the micro catheter within distal branches, no proximal coil was deployed. Instead, course Gel-Foam embolization of the territory was elected as treatment to decrease the pressure to the territory. Puncture of the right common femoral artery. 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 Tortuosity and small vasculature precluded a distal catheter position into the territory for deposition of metallic coils. Deployment of Exoseal for hemostasis. Signed, Dulcy Fanny. Earleen Newport, DO [facility] Radiology Electronically Signed   By: Corrie Mckusick D.O.   On: 05/12/2016 08:22   Ir US Guide Vasc Access Right  Result Date: 05/12/2016 INDICATION: 82 year old male with life-threatening lower GI hemorrhage. CT angiogram demonstrates hemorrhage within the distribution the right colic artery/middle colic artery near the hepatic flexure. EXAM: SELECTIVE VISCERAL ARTERIOGRAPHY; IR EMBO ART VEN HEMORR LYMPH EXTRAV INC GUIDE ROADMAPPING; IR ULTRASOUND GUIDANCE VASC ACCESS RIGHT; ADDITIONAL ARTERIOGRAPHY; ARTERIOGRAPHY MEDICATIONS: 100 mcg nitro, 4 mg IV Zofran. The antibiotic was administered within 1 hour of the procedure ANESTHESIA/SEDATION: Moderate (conscious) sedation was employed during this procedure. A total of Versed 2.0 mg and Fentanyl 100 mcg was administered intravenously. Moderate Sedation Time: 90 minutes. The patient's level of consciousness and vital signs were monitored continuously by radiology nursing throughout the procedure under my direct supervision. CONTRAST:  160 cc Isovue FLUOROSCOPY TIME:  Fluoroscopy Time: 16 minutes 0 seconds (31.3 mGy). COMPLICATIONS: None PROCEDURE: Informed  consent was obtained from the patient following explanation of the procedure, risks, benefits and alternatives. The patient understands, agrees and consents for the procedure. All questions were addressed. A time out was performed prior to the initiation of the procedure. Maximal barrier sterile technique utilized including caps, mask, sterile gowns, sterile gloves, large sterile drape, hand hygiene, and Betadine prep. Ultrasound survey of the right inguinal region was performed with images stored and sent to PACs. A micropuncture needle was used access the right common femoral artery under ultrasound. With excellent arterial blood flow returned, and an .018 micro wire was passed through the needle, observed enter the abdominal aorta under fluoroscopy. The needle was removed, and a micropuncture sheath was placed over the wire. The inner dilator and wire were removed, and an 035 Bentson wire was advanced under fluoroscopy into the abdominal aorta. The sheath was removed and a standard 5 [place] vascular sheath was placed. The dilator was removed and the sheath was flushed. Standard 5 French C2 Cobra catheter was advanced over the [place] wire. Cobra catheter was used to select the superior mesenteric artery. Angiogram was performed. C2 Cobra was exchanged over the [place] wire for [place] catheter. Micro catheter system was then used through the [place] catheter with 0.016 micro wire and 135cm Lantern micro catheter. Right/middle hepatic artery was then selected as the vasculature perfusing the area of interest. Sub selective angiogram performed of branches of the right/ middle artery. Given the significant tortuosity of the vasculature and small caliber, micro catheter was not able to advance to a distal position. Angiogram demonstrated no evidence of active extravasation. 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 Single  Gel-Foam pledget infused into target artery of interest, and then withdrawal of the  catheter in repeat angiogram demonstrated small additional dysplastic branches at the hepatic flexure. A course Gel-Foam slurry was then infused with no significant filling at the completion. Micro catheter was flushed, withdrawn into the superior mesenteric artery, and then angiogram of the collateral vasculature to the GDA was performed. No perfusion of the areae of interest observed. Limited angiogram of the right common femoral artery performed. Exoseal was deployed for closure. Patient tolerated the procedure well and remained hemodynamically stable throughout. No complications were encountered and no significant blood loss encountered. FINDINGS: Ultrasound survey demonstrates mild atherosclerotic changes of the right common femoral artery which is widely patent. Superior mesenteric artery angiogram demonstrates patent superior mesenteric artery, patent ileal colic artery, patent arcades to the small bowel vasculature. 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 Splenic artery patent, left gastric artery patent, common hepatic artery patent, left hepatic artery and right hepatic artery patent. Large collateral vasculature contributed to the origin of gastroepiploic artery. Perfusion of the hepatic flexure was predominantly via right colic/middle colic branch. Sub selective angiogram of the colic branches demonstrated no active extravasation or pseudoaneurysm. The knee shunting observed with angio dysplasia. Given the tortuosity and the inability to place the tip of the micro catheter within distal branches, no proximal coil was deployed. Instead, course Gel-Foam embolization of the territory was elected as treatment to decrease the pressure to the territory. Puncture of the right common femoral artery. 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 Tortuosity and small vasculature precluded a distal catheter position into the territory for deposition of metallic coils. Deployment of Exoseal for hemostasis. Signed, Dulcy Fanny. Earleen Newport, DO [facility] Radiology Electronically Signed   By: Corrie Mckusick D.O.   On: 05/12/2016 08:22   Dg Chest Port 1 View  Result Date: 05/13/2016 CLINICAL DATA:  Dyspnea. EXAM: PORTABLE CHEST 1 VIEW COMPARISON:  Radiographs of Nov 26, 2008. FINDINGS: Stable cardiomediastinal silhouette. Atherosclerosis of thoracic aorta is noted. No pneumothorax is noted. Right lung is clear. Mild left basilar atelectasis or infiltrate is noted with minimal associated pleural effusion. Bony thorax is unremarkable. IMPRESSION: Aortic atherosclerosis. Mild left basilar atelectasis or infiltrate is noted with minimal associated pleural effusion. Electronically Signed   By: Marijo Conception, M.D.   On: 05/13/2016 07:30   Dg Knee Left Port  Result Date: 05/12/2016 CLINICAL DATA:  Recent fall.  Pain . EXAM: PORTABLE LEFT KNEE - 1-2 VIEW COMPARISON:  No recent prior. FINDINGS: Mild tricompartment degenerative change. No acute bony or joint abnormality identified. No evidence of fracture or dislocation. IMPRESSION: Mild tricompartment degenerative change.  No acute abnormality Electronically Signed   By: Marcello Moores  Register   On: 05/12/2016 07:32   Dg Abd Portable 1v  Result Date: 05/13/2016 CLINICAL DATA:  Initial evaluation for acute mid abdominal pain. EXAM: PORTABLE ABDOMEN - 1 VIEW COMPARISON:  Prior CT from 05/11/2016. FINDINGS: Multiple prominent gas-filled loops of bowel are seen throughout the abdomen. These loops predominantly reflect the colon, although a few prominent loops of small bowel present as well. No air-fluid levels. No free  air on these limited supine views. Imaging findings suggest ileus, although developing distal obstructive process not  excluded. No soft tissue mass or abnormal calcification. Visualized lung bases are largely clear. No acute osseous abnormality. IMPRESSION: Multiple prominent gas-filled loops of predominantly large bowel within the abdomen. Findings are favored to reflect an ileus, although a distal obstructive process is not entirely excluded. Close interval follow-up recommended if there is high clinical concern for an the evolving obstructive process. Electronically Signed   By: Jeannine Boga M.D.   On: 05/13/2016 20:31   [place] Guide Roadmapping  Result Date: 05/12/2016 INDICATION: 82 year old male with life-threatening lower GI hemorrhage. CT angiogram demonstrates hemorrhage within the distribution the right colic artery/middle colic artery near the hepatic flexure. EXAM: SELECTIVE VISCERAL ARTERIOGRAPHY; IR EMBO ART VEN HEMORR LYMPH EXTRAV INC GUIDE ROADMAPPING; IR ULTRASOUND GUIDANCE VASC ACCESS RIGHT; ADDITIONAL ARTERIOGRAPHY; ARTERIOGRAPHY MEDICATIONS: 100 mcg nitro, 4 mg IV Zofran. The antibiotic was administered within 1 hour of the procedure ANESTHESIA/SEDATION: Moderate (conscious) sedation was employed during this procedure. A total of Versed 2.0 mg and Fentanyl 100 mcg was administered intravenously. Moderate Sedation Time: 90 minutes. The patient's level of consciousness and vital signs were monitored continuously by radiology nursing throughout the procedure under my direct supervision. CONTRAST:  160 cc Isovue FLUOROSCOPY TIME:  Fluoroscopy Time: 16 minutes 0 seconds (31.3 mGy). COMPLICATIONS: None PROCEDURE: Informed consent was obtained from the patient following explanation of the procedure, risks, benefits and alternatives. The patient understands, agrees and consents for the procedure. All questions were addressed. A time out was performed prior  to the initiation of the procedure. Maximal barrier sterile technique utilized including caps, mask, sterile gowns, sterile gloves, large sterile drape, hand hygiene, and Betadine prep. Ultrasound survey of the right inguinal region was performed with images stored and sent to PACs. A micropuncture needle was used access the right common femoral artery under ultrasound. With excellent arterial blood flow returned, and an .018 micro wire was passed through the needle, observed enter the abdominal aorta under fluoroscopy. The needle was removed, and a micropuncture sheath was placed over the wire. The inner dilator and wire were removed, and an 035 Bentson wire was advanced under fluoroscopy into the abdominal aorta. The sheath was removed and a standard 5 [place] vascular sheath was placed. The dilator was removed and the sheath was flushed. Standard 5 French C2 Cobra catheter was advanced over the [place] wire. Cobra catheter was used to select the superior mesenteric artery. Angiogram was performed. C2 Cobra was exchanged over the [place] wire for [place] catheter. Micro catheter system was then used through the [place] catheter with 0.016 micro wire and 135cm [place] micro catheter. Right/middle hepatic artery was then selected as the vasculature perfusing the area of interest. Sub selective angiogram performed of branches of the right/ middle artery. Given the significant tortuosity of the vasculature and small caliber, micro catheter was not able to advance to a distal position. Angiogram demonstrated no evidence of active extravasation. 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 Single Gel-Foam pledget infused into target artery of interest, and then withdrawal of the catheter in repeat angiogram demonstrated small additional dysplastic branches at the hepatic flexure. A course Gel-Foam slurry was then infused with no significant filling at the completion. Micro catheter was flushed, withdrawn into  the superior mesenteric artery, and then angiogram of the collateral vasculature to the GDA was performed. No perfusion of the areae of interest observed. Limited angiogram of the right common femoral artery performed. Exoseal was deployed for closure. Patient tolerated the  procedure well and remained hemodynamically stable throughout. No complications were encountered and no significant blood loss encountered. FINDINGS: Ultrasound survey demonstrates mild atherosclerotic changes of the right common femoral artery which is widely patent. Superior mesenteric artery angiogram demonstrates patent superior mesenteric artery, patent ileal colic artery, patent arcades to the small bowel vasculature. 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 Splenic artery patent, left gastric artery patent, common hepatic artery patent, left hepatic artery and right hepatic artery patent. Large collateral vasculature contributed to the origin of gastroepiploic artery. Perfusion of the hepatic flexure was predominantly via right colic/middle colic branch. Sub selective angiogram of the colic branches demonstrated no active extravasation or pseudoaneurysm. The knee shunting observed with angio dysplasia. Given the tortuosity and the inability to place the tip of the micro catheter within distal branches, no proximal coil was deployed. Instead, course Gel-Foam embolization of the territory was elected as treatment to decrease the pressure to the territory. Puncture of the right common femoral artery. 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 Tortuosity and small vasculature precluded a distal catheter position into the  territory for deposition of metallic coils. Deployment of Exoseal for hemostasis. Signed, Dulcy Fanny. Earleen Newport, DO [facility] Electronically Signed   By: Corrie Mckusick D.O.   On: 05/12/2016 08:22   Ct Angio Abd/pel W And/or Wo Contrast  Result Date: 05/11/2016 CLINICAL DATA:  Acute onset of syncope and bright red rectal bleeding. Lightheadedness and generalized weakness. Initial encounter. EXAM: CTA ABDOMEN AND PELVIS wITHOUT AND WITH CONTRAST TECHNIQUE: Multidetector CT imaging of the abdomen and pelvis was performed using the standard protocol during bolus administration of intravenous contrast. Multiplanar reconstructed images and MIPs were obtained and reviewed to evaluate the vascular anatomy. CONTRAST:  75 mL of Isovue 370 IV contrast COMPARISON:  None. FINDINGS: VASCULAR Aorta: Scattered calcification is seen along the abdominal aorta, without significant luminal narrowing. 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 SMA: The superior mesenteric artery appears patent, without evidence of luminal narrowing. Renals: Mild calcification is noted at the proximal renal arteries bilaterally, without significant luminal narrowing. IMA: The inferior mesenteric artery remains patent. Inflow: Scattered calcification is seen along the common and internal iliac arteries. The external iliac arteries and common femoral arteries remain fully patent bilaterally, with mild calcification at the right common femoral artery. Proximal Outflow: Minimal mural thrombus is suggested at the proximal left superficial femoral artery. Minimal mural thrombus is suggested at the proximal right profunda femoris artery. Veins: Visualized venous structures are grossly unremarkable. The inferior vena cava is partially decompressed and unremarkable in appearance. Review of the MIP images confirms the above  findings. NON-VASCULAR Lower chest: Mild bibasilar atelectasis or scarring is noted. Diffuse coronary artery calcifications are seen. The visualized portions of the mediastinum are otherwise unremarkable. Hepatobiliary: The liver is unremarkable in appearance. The gallbladder is unremarkable in appearance. The common bile duct remains normal in caliber. Pancreas: The pancreas is within normal limits. Spleen: The spleen is unremarkable in appearance. Adrenals/Urinary Tract: The adrenal glands are unremarkable in appearance. A large 7.5 cm cyst is noted at the upper pole of the left kidney. Mild nonspecific perinephric stranding is noted bilaterally. Additional scattered small bilateral renal cysts  are seen. Mild left-sided renal pelvicaliectasis remains within normal limits, without evidence of significant hydronephrosis. No renal or ureteral stones are identified. Stomach/Bowel: The stomach is unremarkable in appearance. The small bowel is within normal limits. The appendix is not visualized; there is no evidence for appendicitis. Scattered diverticulosis is noted along the proximal sigmoid colon, without evidence of diverticulitis. There is a large amount of acute extravasation of contrast at the ascending colon, just proximal to the hepatic flexure of the colon, compatible with active intraluminal hemorrhage. This explains the patient's bright red blood per rectum. Lymphatic: Retroperitoneal nodes are grossly unremarkable in appearance. No pelvic sidewall lymphadenopathy is appreciated. Reproductive: The bladder is mildly distended and grossly unremarkable. The prostate remains normal in size. Other: No additional soft tissue abnormalities are seen. Musculoskeletal: No acute osseous abnormalities are identified. Vacuum phenomenon is noted at L5-S1. Underlying facet disease is noted. The visualized musculature is unremarkable in appearance. IMPRESSION: VASCULAR 1. Large amount of acute extravasation of contrast at  the ascending colon, just proximal to the hepatic flexure of the colon, compatible with active intraluminal hemorrhage. This explains the patient's bright red blood per rectum. 2. Scattered aortic atherosclerosis noted. 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 3. Diffuse coronary artery calcifications seen. NON-VASCULAR 1. Mild bibasilar atelectasis or scarring noted. 2. Scattered bilateral renal cysts, measuring up to 7.5 cm on the left. 3. Scattered diverticulosis along the proximal sigmoid colon, without evidence of diverticulitis. These results were called by telephone at the time of interpretation on 05/11/2016 at 9:58 pm to Dr. Carrie Mew, who verbally acknowledged these results. Electronically Signed   By: Garald Balding M.D.   On: 05/11/2016 22:04    Assessment/Plan:   1. Acute gastritis without hemorrhage, unspecified gastritis type  Improved  Continue carafate 1 gram suspension QID x 4 weeks  FU with GI MD asap after d/c   2. Acute blood loss anemia  Resolved  Hgb stable  3. Gastrointestinal hemorrhage, unspecified gastrointestinal hemorrhage type  Resolved  F/u with md asap after d/c    Patient is being discharged with the following home health services:  HHPT/OT  Patient is being discharged with the following durable medical equipment:  RW  Patient has been advised to f/u with their PCP in 1-2 weeks to bring them up to date on their rehab stay.  Social services at facility was responsible for arranging this appointment.  Pt was provided with a 30 day supply of prescriptions for medications and refills must be obtained from their PCP.  For controlled substances, a more limited supply may be provided adequate until PCP appointment only.  Future labs/tests needed:  None- per pcp  Family/ staff Communication:   Total Time:  Documentation:  Face to Face:  Family/Phone:  Vikki Ports,  NP-C [facility] [address], [place] [address] Cell Phone (Mon-Fri 8am-5pm):  [phone] On Call:  [phone] & follow prompts after 5pm & weekends Office Phone:  [phone] Office Fax:  [phone]

## 2016-06-01 NOTE — Progress Notes (Signed)
Location:      Place of Service:  SNF (31) Provider:  Toni Arthurs, NP-C  [facility]  Patient Care Team: Katheren Shams as PCP - General  Extended Emergency Contact Information Primary Emergency Contact: [place] Phone: [phone] Relation: Daughter Secondary Emergency Contact: Cameron Sprang [place] Phone: [phone] Relation: Son  Code Status:  Full  Goals of care: Advanced Directive information Advanced Directives 05/11/2016  Does patient have an advance directive? No     Chief Complaint  Patient presents with  . Acute Visit    HPI:  Pt is a 82 y.o. male seen today for an acute visit for nausea and vomiting. Pt reports he is able to hold somethings down, but is nauseated most of the time. Nothing makes it better, nothing identitifed that makes it worse. Pt appears "dry". Dry mucus membranes. Pt agreeable to abd xray. Pt reports his BM are irregular and spuratic/ unpredictable. No recent med changes aside form abt for PNA. VSS, +voiding normal. No other complaints.    Past Medical History:  Diagnosis Date  . GI bleed   . HOH (hard of hearing)    Past Surgical History:  Procedure Laterality Date  . IR GENERIC HISTORICAL  05/12/2016   IR EMBO ART  VEN HEMORR LYMPH EXTRAV  INC GUIDE ROADMAPPING 05/12/2016 Corrie Mckusick, DO [facility]  . IR GENERIC HISTORICAL  05/12/2016   IR ANGIOGRAM VISCERAL SELECTIVE 05/12/2016 Corrie Mckusick, DO [facility]  . IR GENERIC HISTORICAL  05/12/2016   IR US GUIDE VASC ACCESS RIGHT 05/12/2016 Corrie Mckusick, DO [facility]  . IR GENERIC HISTORICAL  05/12/2016   IR ANGIOGRAM SELECTIVE EACH ADDITIONAL VESSEL 05/12/2016 Corrie Mckusick, DO [facility]  . IR GENERIC HISTORICAL  05/12/2016   IR ANGIOGRAM SELECTIVE EACH ADDITIONAL VESSEL 05/12/2016 Corrie Mckusick, DO [facility]  . IR GENERIC HISTORICAL  05/12/2016   IR ANGIOGRAM FOLLOW UP STUDY 05/12/2016 Corrie Mckusick, DO [facility]  . IR GENERIC HISTORICAL  05/12/2016   IR ANGIOGRAM SELECTIVE EACH ADDITIONAL VESSEL 05/12/2016 Corrie Mckusick, DO [facility]    No Known Allergies    Medication List       Accurate as of 05/28/16 11:59 PM. Always use your most recent med list.          alum & mag hydroxide-simeth 200-200-20 MG/5ML suspension Commonly known as:  MAALOX/MYLANTA Take 15 mLs by mouth every 4 (four) hours as needed for indigestion or heartburn.   amLODipine 5 MG tablet Commonly known as:  NORVASC Take 1 tablet (5 mg total) by mouth daily.   feeding supplement (ENSURE ENLIVE) Liqd Take 237 mLs by mouth 2 (two) times daily between meals.   metoprolol tartrate 25 MG tablet Commonly known as:  LOPRESSOR Take 1 tablet (25 mg total) by mouth 2 (two) times daily.   RESOURCE THICKENUP CLEAR Powd As needed   thiamine 100 MG tablet Take 1 tablet (100 mg total) by mouth daily.       Review of Systems  Constitutional: Negative for activity change, appetite change, chills, diaphoresis and fever.  HENT: Negative.   Eyes: Negative.   Respiratory: Negative for apnea, cough, choking, chest tightness, shortness of breath and wheezing.   Cardiovascular: Negative for chest pain, palpitations and leg swelling.  Gastrointestinal: Positive for constipation, nausea and vomiting. Negative for abdominal distention, abdominal pain, anal bleeding, blood in stool, diarrhea and rectal pain.  Genitourinary: Negative for difficulty urinating, dysuria,  frequency and urgency.  Musculoskeletal: Positive for arthralgias (typical arthritis). Negative for back pain, gait problem and myalgias.  Skin: Negative for color change, pallor, rash and wound.  Neurological: Negative for dizziness, tremors, syncope, speech difficulty, weakness, numbness and headaches.  Psychiatric/Behavioral: Negative.   All other systems reviewed and are negative.    There is no immunization history on file for this  patient. There are no preventive care reminders to display for this patient. No flowsheet data found. Functional Status Survey:    Vitals:   05/28/16 1230  BP: 137/63  Pulse: 88  Resp: 20  Temp: 98.1 F (36.7 C)  SpO2: 99%   There is no height or weight on file to calculate BMI. Physical Exam  Constitutional: He is oriented to person, place, and time. Vital signs are normal. He appears well-developed and well-nourished. He is active and cooperative. He does not appear ill. No distress.  HENT:  Head: Normocephalic and atraumatic.  Mouth/Throat: Uvula is midline and oropharynx is clear and moist. Mucous membranes are not pale, dry and not cyanotic.  Eyes: Conjunctivae, EOM and lids are normal. Pupils are equal, round, and reactive to light.  Neck: Trachea normal, normal range of motion and full passive range of motion without pain. Neck supple. No JVD present. No tracheal deviation, no edema and no erythema present. No thyromegaly present.  Cardiovascular: Normal rate, regular rhythm, normal heart sounds, intact distal pulses and normal pulses.  Exam reveals no gallop, no distant heart sounds and no friction rub.   No murmur heard. Pulmonary/Chest: Effort normal and breath sounds normal. No accessory muscle usage. No respiratory distress. He has no wheezes. He has no rales. He exhibits no tenderness.  Abdominal: Soft. Normal appearance. He exhibits no distension, no fluid wave, no ascites and no mass. Bowel sounds are decreased. There is no tenderness.  Musculoskeletal: Normal range of motion. He exhibits no edema or tenderness.  Expected osteoarthritis, stiffness  Neurological: He is alert and oriented to person, place, and time. He has normal strength.  Skin: Skin is warm, dry and intact. He is not diaphoretic. No cyanosis. No pallor. Nails show no clubbing.  Psychiatric: He has a normal mood and affect. His speech is normal and behavior is normal. Judgment and thought content normal.  Cognition and memory are normal.  Nursing note and vitals reviewed.   Labs reviewed:  Recent Labs  05/12/16 0355 05/12/16 1057 05/13/16 0222  05/15/16 1218  05/19/16 0832 05/22/16 0730 05/28/16 0600  NA 143  --  141  < >  --   < > 141 139 140  K 4.8  --  3.9  < >  --   < > 4.1 4.0 4.2  CL 119*  --  117*  < >  --   < > 111 110 105  CO2 20*  --  15*  < >  --   < > 24 24 26   GLUCOSE 141*  --  137*  < >  --   < > 85 86 120*  BUN 22*  --  38*  < >  --   < > 12 17 29*  CREATININE 1.21  --  1.95*  < >  --   < > 0.97 0.83 1.02  CALCIUM 7.4*  --  7.9*  < >  --   < > 7.9* 8.2* 8.8*  MG 1.6*  --  2.3  --  1.9  --   --   --   --  PHOS 4.2 5.1*  --   --   --   --   --   --   --   < > = values in this interval not displayed.  Recent Labs  05/11/16 1741 05/14/16 0251  AST 22 66*  ALT 9* 23  ALKPHOS 48 56  BILITOT 0.5 1.1  PROT 5.1* 5.0*  ALBUMIN 3.2* 2.7*    Recent Labs  05/11/16 1741  05/19/16 0832 05/22/16 0730 05/28/16 0600  WBC 16.5*  < > 8.8 8.2 9.4  NEUTROABS 5.6  --   --   --  6.9*  HGB 7.8*  < > 9.1* 10.1* 11.4*  HCT 23.6*  < > 28.0* 30.4* 34.9*  MCV 93.5  < > 85.9 85.7 85.0  PLT 114*  < > 87* 122* 199  < > = values in this interval not displayed. Lab Results  Component Value Date   TSH 4.132 05/16/2016   No results found for: HGBA1C No results found for: CHOL, HDL, LDLCALC, LDLDIRECT, TRIG, CHOLHDL  Significant Diagnostic Results in last 30 days:  Dg Abd 1 View  Result Date: 05/14/2016 CLINICAL DATA:  Abdominal pain and gas pain. EXAM: ABDOMEN - 1 VIEW COMPARISON:  05/13/2016 FINDINGS: Again noted are gas-filled loops of bowel throughout the abdomen. The most dilated loops of bowel appear to represent colon. There is some small bowel gas distension. Limited evaluation for free air on this single supine image. IMPRESSION: Gas-filled loops of bowel throughout the abdomen. Minimal change from the previous examination and findings may represent an ileus.  Electronically Signed   By: Markus Daft M.D.   On: 05/14/2016 10:51   Ir Angiogram Visceral Selective  Result Date: 05/12/2016 INDICATION: 82 year old male with life-threatening lower GI hemorrhage. CT angiogram demonstrates hemorrhage within the distribution the right colic artery/middle colic artery near the hepatic flexure. EXAM: SELECTIVE VISCERAL ARTERIOGRAPHY; IR EMBO ART VEN HEMORR LYMPH EXTRAV INC GUIDE ROADMAPPING; IR ULTRASOUND GUIDANCE VASC ACCESS RIGHT; ADDITIONAL ARTERIOGRAPHY; ARTERIOGRAPHY MEDICATIONS: 100 mcg nitro, 4 mg IV Zofran. The antibiotic was administered within 1 hour of the procedure ANESTHESIA/SEDATION: Moderate (conscious) sedation was employed during this procedure. A total of Versed 2.0 mg and Fentanyl 100 mcg was administered intravenously. Moderate Sedation Time: 90 minutes. The patient's level of consciousness and vital signs were monitored continuously by radiology nursing throughout the procedure under my direct supervision. CONTRAST:  160 cc Isovue FLUOROSCOPY TIME:  Fluoroscopy Time: 16 minutes 0 seconds (31.3 mGy). COMPLICATIONS: None PROCEDURE: Informed consent was obtained from the patient following explanation of the procedure, risks, benefits and alternatives. The patient understands, agrees and consents for the procedure. All questions were addressed. A time out was performed prior to the initiation of the procedure. Maximal barrier sterile technique utilized including caps, mask, sterile gowns, sterile gloves, large sterile drape, hand hygiene, and Betadine prep. Ultrasound survey of the right inguinal region was performed with images stored and sent to PACs. A micropuncture needle was used access the right common femoral artery under ultrasound. With excellent arterial blood flow returned, and an .018 micro wire was passed through the needle, observed enter the abdominal aorta under fluoroscopy. The needle was removed, and a micropuncture sheath was placed over the  wire. The inner dilator and wire were removed, and an 035 [place] wire was advanced under fluoroscopy into the abdominal aorta. The sheath was removed and a standard 5 [place] vascular sheath was placed. The dilator was removed and the sheath was flushed. Standard 5 [place] C2 [employer]  catheter was advanced over the Bentson wire. Cobra catheter was used to select the superior mesenteric artery. Angiogram was performed. C2 Cobra was exchanged over the Bentson wire for [place] catheter. Micro catheter system was then used through the [place] catheter with 0.016 micro wire and 135cm [place] micro catheter. Right/middle hepatic artery was then selected as the vasculature perfusing the area of interest. Sub selective angiogram performed of branches of the right/ middle artery. Given the significant tortuosity of the vasculature and small caliber, micro catheter was not able to advance to a distal position. Angiogram demonstrated no evidence of active extravasation. 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 Single Gel-Foam pledget infused into target artery of interest, and then withdrawal of the catheter in repeat angiogram demonstrated small additional dysplastic branches at the hepatic flexure. A course Gel-Foam slurry was then infused with no significant filling at the completion. Micro catheter was flushed, withdrawn into the superior mesenteric artery, and then angiogram of the collateral vasculature to the GDA was performed. No perfusion of the areae of interest observed. Limited angiogram of the right common femoral artery performed. Exoseal was deployed for closure. Patient tolerated the procedure well and remained hemodynamically stable throughout. No complications were encountered and no significant blood loss encountered. FINDINGS: Ultrasound survey demonstrates mild atherosclerotic changes of the right common femoral artery which is widely patent. Superior mesenteric artery angiogram demonstrates  patent superior mesenteric artery, patent ileal colic artery, patent arcades to the small bowel vasculature. 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 Splenic artery patent, left gastric artery patent, common hepatic artery patent, left hepatic artery and right hepatic artery patent. Large collateral vasculature contributed to the origin of gastroepiploic artery. Perfusion of the hepatic flexure was predominantly via right colic/middle colic branch. Sub selective angiogram of the colic branches demonstrated no active extravasation or pseudoaneurysm. The knee shunting observed with angio dysplasia. Given the tortuosity and the inability to place the tip of the micro catheter within distal branches, no proximal coil was deployed. Instead, course Gel-Foam embolization of the territory was elected as treatment to decrease the pressure to the territory. Puncture of the right common femoral artery. 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 Tortuosity and small vasculature precluded a distal catheter position into the territory for deposition of metallic coils. Deployment of Exoseal for hemostasis. Signed, Dulcy Fanny. Earleen Newport, DO [facility] Rad[facility] Electronically Signed   By: Corrie Mckusick D.O.   On: 05/12/2016 08:22   Ir Angiogram Selective Each Additional Vessel  Result Date: 05/12/2016 INDICATION: 82 year old male with life-threatening lower GI hemorrhage. CT angiogram demonstrates hemorrhage within the distribution the right colic artery/middle colic artery near the hepatic flexure. EXAM: SELECTIVE VISCERAL ARTERIOGRAPHY; IR EMBO ART VEN HEMORR  LYMPH EXTRAV INC GUIDE ROADMAPPING; IR ULTRASOUND GUIDANCE VASC ACCESS RIGHT; ADDITIONAL ARTERIOGRAPHY; ARTERIOGRAPHY MEDICATIONS: 100 mcg nitro, 4 mg IV Zofran. The antibiotic was administered within 1 hour of the procedure ANESTHESIA/SEDATION: Moderate (conscious) sedation was employed during this procedure. A total of Versed 2.0 mg and Fentanyl 100 mcg was administered intravenously. Moderate Sedation Time: 90 minutes. The patient's level of consciousness and vital signs were monitored continuously by radiology nursing throughout the procedure under my direct supervision. CONTRAST:  160 cc Isovue FLUOROSCOPY TIME:  Fluoroscopy  Time: 16 minutes 0 seconds (31.3 mGy). COMPLICATIONS: None PROCEDURE: Informed consent was obtained from the patient following explanation of the procedure, risks, benefits and alternatives. The patient understands, agrees and consents for the procedure. All questions were addressed. A time out was performed prior to the initiation of the procedure. Maximal barrier sterile technique utilized including caps, mask, sterile gowns, sterile gloves, large sterile drape, hand hygiene, and Betadine prep. Ultrasound survey of the right inguinal region was performed with images stored and sent to PACs. A micropuncture needle was used access the right common femoral artery under ultrasound. With excellent arterial blood flow returned, and an .018 micro wire was passed through the needle, observed enter the abdominal aorta under fluoroscopy. The needle was removed, and a micropuncture sheath was placed over the wire. The inner dilator and wire were removed, and an 035 Bentson wire was advanced under fluoroscopy into the abdominal aorta. The sheath was removed and a standard 5 [place] vascular sheath was placed. The dilator was removed and the sheath was flushed. Standard 5 French C2 Cobra catheter was advanced over the [place] wire. Cobra catheter was used to select the superior mesenteric artery.  Angiogram was performed. C2 Cobra was exchanged over the Bentson wire for [place] catheter. Micro catheter system was then used through the [place] catheter with 0.016 micro wire and 135cm Lantern micro catheter. Right/middle hepatic artery was then selected as the vasculature perfusing the area of interest. Sub selective angiogram performed of branches of the right/ middle artery. Given the significant tortuosity of the vasculature and small caliber, micro catheter was not able to advance to a distal position. Angiogram demonstrated no evidence of active extravasation. 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 Single Gel-Foam pledget infused into target artery of interest, and then withdrawal of the catheter in repeat angiogram demonstrated small additional dysplastic branches at the hepatic flexure. A course Gel-Foam slurry was then infused with no significant filling at the completion. Micro catheter was flushed, withdrawn into the superior mesenteric artery, and then angiogram of the collateral vasculature to the GDA was performed. No perfusion of the areae of interest observed. Limited angiogram of the right common femoral artery performed. Exoseal was deployed for closure. Patient tolerated the procedure well and remained hemodynamically stable throughout. No complications were encountered and no significant blood loss encountered. FINDINGS: Ultrasound survey demonstrates mild atherosclerotic changes of the right common femoral artery which is widely patent. Superior mesenteric artery angiogram demonstrates patent superior mesenteric artery, patent ileal colic artery, patent arcades to the small bowel vasculature. 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 Splenic artery patent, left gastric artery patent, common  hepatic artery patent, left hepatic artery and right hepatic artery patent. Large collateral vasculature contributed to the origin of gastroepiploic artery. Perfusion of the hepatic flexure was predominantly via right colic/middle colic branch. Sub selective angiogram of the colic branches demonstrated no active extravasation or pseudoaneurysm. The knee shunting observed with angio dysplasia. Given the tortuosity and the inability to place the tip of the micro catheter within distal branches, no proximal coil was deployed. Instead, course Gel-Foam embolization of the territory was elected as treatment to decrease the pressure to the territory. Puncture of the right common femoral artery. 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  Tortuosity and small vasculature precluded a distal catheter position into the territory for deposition of metallic coils. Deployment of Exoseal for hemostasis. Signed, Dulcy Fanny. Earleen Newport, DO [facility] Radiology Electronically Signed   By: Corrie Mckusick D.O.   On: 05/12/2016 08:22   Ir Angiogram Selective Each Additional Vessel  Result Date: 05/12/2016 INDICATION: 82 year old male with life-threatening lower GI hemorrhage. CT angiogram demonstrates hemorrhage within the distribution the right colic artery/middle colic artery near the hepatic flexure. EXAM: SELECTIVE VISCERAL ARTERIOGRAPHY; IR EMBO ART VEN HEMORR LYMPH EXTRAV INC GUIDE ROADMAPPING; IR ULTRASOUND GUIDANCE VASC ACCESS RIGHT; ADDITIONAL ARTERIOGRAPHY; ARTERIOGRAPHY MEDICATIONS: 100 mcg nitro, 4 mg IV Zofran. The antibiotic was administered within 1 hour of the procedure ANESTHESIA/SEDATION: Moderate (conscious) sedation was employed during this procedure. A total of Versed 2.0 mg and Fentanyl 100 mcg was administered intravenously.  Moderate Sedation Time: 90 minutes. The patient's level of consciousness and vital signs were monitored continuously by radiology nursing throughout the procedure under my direct supervision. CONTRAST:  160 cc Isovue FLUOROSCOPY TIME:  Fluoroscopy Time: 16 minutes 0 seconds (31.3 mGy). COMPLICATIONS: None PROCEDURE: Informed consent was obtained from the patient following explanation of the procedure, risks, benefits and alternatives. The patient understands, agrees and consents for the procedure. All questions were addressed. A time out was performed prior to the initiation of the procedure. Maximal barrier sterile technique utilized including caps, mask, sterile gowns, sterile gloves, large sterile drape, hand hygiene, and Betadine prep. Ultrasound survey of the right inguinal region was performed with images stored and sent to PACs. A micropuncture needle was used access the right common femoral artery under ultrasound. With excellent arterial blood flow returned, and an .018 micro wire was passed through the needle, observed enter the abdominal aorta under fluoroscopy. The needle was removed, and a micropuncture sheath was placed over the wire. The inner dilator and wire were removed, and an 035 Bentson wire was advanced under fluoroscopy into the abdominal aorta. The sheath was removed and a standard 5 [place] vascular sheath was placed. The dilator was removed and the sheath was flushed. Standard 5 French C2 Cobra catheter was advanced over the [place] wire. Cobra catheter was used to select the superior mesenteric artery. Angiogram was performed. C2 Cobra was exchanged over the Bentson wire for [place] catheter. Micro catheter system was then used through the [place] catheter with 0.016 micro wire and 135cm Lantern micro catheter. Right/middle hepatic artery was then selected as the vasculature perfusing the area of interest. Sub selective angiogram performed of branches of the right/ middle artery. Given  the significant tortuosity of the vasculature and small caliber, micro catheter was not able to advance to a distal position. Angiogram demonstrated no evidence of active extravasation. 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 Single Gel-Foam pledget infused into target artery of interest, and then withdrawal of the catheter in repeat angiogram demonstrated small additional dysplastic branches at the hepatic flexure. A course Gel-Foam slurry was then infused with no significant filling at the completion. Micro catheter was flushed, withdrawn into the superior mesenteric artery, and then angiogram of the collateral vasculature to the GDA was performed. No perfusion of the areae of interest observed. Limited angiogram of the right common femoral artery performed. Exoseal was deployed for closure. Patient tolerated the procedure well and remained hemodynamically stable throughout. No complications were encountered and no significant blood loss encountered. FINDINGS: Ultrasound survey demonstrates mild atherosclerotic changes of the right common femoral artery which is widely patent.  Superior mesenteric artery angiogram demonstrates patent superior mesenteric artery, patent ileal colic artery, patent arcades to the small bowel vasculature. 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 Splenic artery patent, left gastric artery patent, common hepatic artery patent, left hepatic artery and right hepatic artery patent. Large collateral vasculature contributed to the origin of gastroepiploic artery. Perfusion of the hepatic flexure was predominantly via right colic/middle colic branch. Sub selective angiogram of the colic branches demonstrated no active extravasation or pseudoaneurysm. The knee shunting observed with angio dysplasia.  Given the tortuosity and the inability to place the tip of the micro catheter within distal branches, no proximal coil was deployed. Instead, course Gel-Foam embolization of the territory was elected as treatment to decrease the pressure to the territory. Puncture of the right common femoral artery. 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 Tortuosity and small vasculature precluded a distal catheter position into the territory for deposition of metallic coils. Deployment of Exoseal for hemostasis. Signed, Dulcy Fanny. Earleen Newport, DO [facility] [facility] Radiology Electronically Signed   By: Corrie Mckusick D.O.   On: 05/12/2016 08:22   Ir Angiogram Selective Each Additional Vessel  Result Date: 05/12/2016 INDICATION: 82 year old male with life-threatening lower GI hemorrhage. CT angiogram demonstrates hemorrhage within the distribution the right colic artery/middle colic artery near the hepatic flexure. EXAM: SELECTIVE VISCERAL ARTERIOGRAPHY; IR EMBO ART VEN HEMORR LYMPH EXTRAV INC GUIDE ROADMAPPING; IR ULTRASOUND GUIDANCE VASC ACCESS RIGHT; ADDITIONAL ARTERIOGRAPHY; ARTERIOGRAPHY MEDICATIONS: 100 mcg nitro, 4 mg IV Zofran. The antibiotic was administered within 1 hour of the procedure ANESTHESIA/SEDATION: Moderate (conscious) sedation was employed during this procedure. A total of Versed 2.0 mg and Fentanyl 100 mcg was administered intravenously. Moderate Sedation Time: 90 minutes. The patient's level of consciousness and vital signs were monitored continuously by radiology nursing throughout the procedure under my direct supervision. CONTRAST:  160 cc Isovue FLUOROSCOPY TIME:  Fluoroscopy Time: 16 minutes 0 seconds (31.3 mGy). COMPLICATIONS: None PROCEDURE: Informed consent was obtained from the patient following explanation of the  procedure, risks, benefits and alternatives. The patient understands, agrees and consents for the procedure. All questions were addressed. A time out was performed prior to the initiation of the procedure. Maximal barrier sterile technique utilized including caps, mask, sterile gowns, sterile gloves, large sterile drape, hand hygiene, and Betadine prep. Ultrasound survey of the right inguinal region was performed with images stored and sent to PACs. A micropuncture needle was used access the right common femoral artery under ultrasound. With excellent arterial blood flow returned, and an .018 micro wire was passed through the needle, observed enter the abdominal aorta under fluoroscopy. The needle was removed, and a micropuncture sheath was placed over the wire. The inner dilator and wire were removed, and an 035 Bentson wire was advanced under fluoroscopy into the abdominal aorta. The sheath was removed and a standard 5 [place] vascular sheath was placed. The dilator was removed and the sheath was flushed. Standard 5 French C2 Cobra catheter was advanced over the [place] wire. Cobra catheter was used to select the superior mesenteric artery. Angiogram was performed. C2 Cobra was exchanged over the [place] wire for [place] catheter. Micro catheter system was then used through the [place] catheter with 0.016 micro wire and 135cm Lantern micro  catheter. Right/middle hepatic artery was then selected as the vasculature perfusing the area of interest. Sub selective angiogram performed of branches of the right/ middle artery. Given the significant tortuosity of the vasculature and small caliber, micro catheter was not able to advance to a distal position. Angiogram demonstrated no evidence of active extravasation. 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 Single Gel-Foam pledget infused into target artery of interest, and then withdrawal of the catheter in repeat angiogram demonstrated small additional  dysplastic branches at the hepatic flexure. A course Gel-Foam slurry was then infused with no significant filling at the completion. Micro catheter was flushed, withdrawn into the superior mesenteric artery, and then angiogram of the collateral vasculature to the GDA was performed. No perfusion of the areae of interest observed. Limited angiogram of the right common femoral artery performed. Exoseal was deployed for closure. Patient tolerated the procedure well and remained hemodynamically stable throughout. No complications were encountered and no significant blood loss encountered. FINDINGS: Ultrasound survey demonstrates mild atherosclerotic changes of the right common femoral artery which is widely patent. Superior mesenteric artery angiogram demonstrates patent superior mesenteric artery, patent ileal colic artery, patent arcades to the small bowel vasculature. 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 Splenic artery patent, left gastric artery patent, common hepatic artery patent, left hepatic artery and right hepatic artery patent. Large collateral vasculature contributed to the origin of gastroepiploic artery. Perfusion of the hepatic flexure was predominantly via right colic/middle colic branch. Sub selective angiogram of the colic branches demonstrated no active extravasation or pseudoaneurysm. The knee shunting observed with angio dysplasia. Given the tortuosity and the inability to place the tip of the micro catheter within distal branches, no proximal coil was deployed. Instead, course Gel-Foam embolization of the territory was elected as treatment to decrease the pressure to the territory. Puncture of the right common femoral artery. 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 Tortuosity and small vasculature precluded a distal catheter position into the territory for deposition of metallic coils. Deployment of Exoseal for hemostasis. Signed, Dulcy Fanny. Earleen Newport, DO [facility] Radiology Electronically Signed   By: Corrie Mckusick D.O.   On: 05/12/2016 08:22   Ir Angiogram Follow Up Study  Result Date: 05/12/2016 INDICATION: 82 year old male with life-threatening lower GI hemorrhage. CT angiogram demonstrates hemorrhage within the distribution the right colic artery/middle colic artery near the hepatic flexure. EXAM: SELECTIVE VISCERAL ARTERIOGRAPHY; IR EMBO ART VEN HEMORR LYMPH EXTRAV INC GUIDE ROADMAPPING; IR ULTRASOUND GUIDANCE VASC ACCESS RIGHT; ADDITIONAL ARTERIOGRAPHY; ARTERIOGRAPHY MEDICATIONS: 100 mcg nitro, 4 mg IV Zofran. The antibiotic was administered within 1 hour of the procedure ANESTHESIA/SEDATION: Moderate (conscious) sedation was employed during this procedure. A total of Versed 2.0 mg and Fentanyl 100 mcg was administered intravenously. Moderate Sedation Time: 90 minutes. The patient's level of consciousness and vital signs were monitored continuously by radiology nursing throughout the procedure under my direct supervision. CONTRAST:  160 cc Isovue FLUOROSCOPY TIME:  Fluoroscopy Time: 16 minutes 0 seconds (31.3 mGy). COMPLICATIONS: None PROCEDURE: Informed consent was obtained from the patient following explanation of the procedure, risks, benefits and alternatives. The patient understands, agrees and consents for the procedure. All questions were addressed. A time out was performed prior to the initiation of the  procedure. Maximal barrier sterile technique utilized including caps, mask, sterile gowns, sterile gloves, large sterile drape, hand hygiene, and Betadine prep. Ultrasound survey of the right inguinal region was  performed with images stored and sent to PACs. A micropuncture needle was used access the right common femoral artery under ultrasound. With excellent arterial blood flow returned, and an .018 micro wire was passed through the needle, observed enter the abdominal aorta under fluoroscopy. The needle was removed, and a micropuncture sheath was placed over the wire. The inner dilator and wire were removed, and an 035 Bentson wire was advanced under fluoroscopy into the abdominal aorta. The sheath was removed and a standard 5 [place] vascular sheath was placed. The dilator was removed and the sheath was flushed. Standard 5 French C2 Cobra catheter was advanced over the [place] wire. Cobra catheter was used to select the superior mesenteric artery. Angiogram was performed. C2 Cobra was exchanged over the [place] wire for [place] catheter. Micro catheter system was then used through the [place] catheter with 0.016 micro wire and 135cm [place] micro catheter. Right/middle hepatic artery was then selected as the vasculature perfusing the area of interest. Sub selective angiogram performed of branches of the right/ middle artery. Given the significant tortuosity of the vasculature and small caliber, micro catheter was not able to advance to a distal position. Angiogram demonstrated no evidence of active extravasation. 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 Single Gel-Foam pledget infused into target artery of interest, and then withdrawal of the catheter in repeat angiogram demonstrated small additional dysplastic branches at the hepatic flexure. A course Gel-Foam slurry was then infused with no significant filling at the completion. Micro catheter was flushed, withdrawn into the superior mesenteric artery, and then angiogram of the collateral vasculature to the GDA was performed. No perfusion of the areae of interest observed. Limited angiogram of the right common femoral artery performed. Exoseal was  deployed for closure. Patient tolerated the procedure well and remained hemodynamically stable throughout. No complications were encountered and no significant blood loss encountered. FINDINGS: Ultrasound survey demonstrates mild atherosclerotic changes of the right common femoral artery which is widely patent. Superior mesenteric artery angiogram demonstrates patent superior mesenteric artery, patent ileal colic artery, patent arcades to the small bowel vasculature. 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 Splenic artery patent, left gastric artery patent, common hepatic artery patent, left hepatic artery and right hepatic artery patent. Large collateral vasculature contributed to the origin of gastroepiploic artery. Perfusion of the hepatic flexure was predominantly via right colic/middle colic branch. Sub selective angiogram of the colic branches demonstrated no active extravasation or pseudoaneurysm. The knee shunting observed with angio dysplasia. Given the tortuosity and the inability to place the tip of the micro catheter within distal branches, no proximal coil was deployed. Instead, course Gel-Foam embolization of the territory was elected as treatment to decrease the pressure to the territory. Puncture of the right common femoral artery. 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 Tortuosity and small vasculature precluded a distal catheter position into the territory for deposition of metallic coils. Deployment of Exoseal for hemostasis. Signed, Dulcy Fanny. Earleen Newport, DO [facility] Radiology Electronically Signed   By: Corrie Mckusick D.O.   On:  05/12/2016 08:22  Ir US Guide Vasc Access Right  Result Date: 05/12/2016 INDICATION: 82 year old male with life-threatening lower GI hemorrhage. CT angiogram demonstrates hemorrhage within the distribution the right colic artery/middle colic artery near the hepatic flexure. EXAM: SELECTIVE VISCERAL ARTERIOGRAPHY; IR EMBO ART VEN HEMORR LYMPH EXTRAV INC GUIDE ROADMAPPING; IR ULTRASOUND GUIDANCE VASC ACCESS RIGHT; ADDITIONAL ARTERIOGRAPHY; ARTERIOGRAPHY MEDICATIONS: 100 mcg nitro, 4 mg IV Zofran. The antibiotic was administered within 1 hour of the procedure ANESTHESIA/SEDATION: Moderate (conscious) sedation was employed during this procedure. A total of Versed 2.0 mg and Fentanyl 100 mcg was administered intravenously. Moderate Sedation Time: 90 minutes. The patient's level of consciousness and vital signs were monitored continuously by radiology nursing throughout the procedure under my direct supervision. CONTRAST:  160 cc Isovue FLUOROSCOPY TIME:  Fluoroscopy Time: 16 minutes 0 seconds (31.3 mGy). COMPLICATIONS: None PROCEDURE: Informed consent was obtained from the patient following explanation of the procedure, risks, benefits and alternatives. The patient understands, agrees and consents for the procedure. All questions were addressed. A time out was performed prior to the initiation of the procedure. Maximal barrier sterile technique utilized including caps, mask, sterile gowns, sterile gloves, large sterile drape, hand hygiene, and Betadine prep. Ultrasound survey of the right inguinal region was performed with images stored and sent to PACs. A micropuncture needle was used access the right common femoral artery under ultrasound. With excellent arterial blood flow returned, and an .018 micro wire was passed through the needle, observed enter the abdominal aorta under fluoroscopy. The needle was removed, and a micropuncture sheath was placed over the wire. The inner dilator and wire were removed, and an  035 Bentson wire was advanced under fluoroscopy into the abdominal aorta. The sheath was removed and a standard 5 [place] vascular sheath was placed. The dilator was removed and the sheath was flushed. Standard 5 French C2 Cobra catheter was advanced over the [place] wire. Cobra catheter was used to select the superior mesenteric artery. Angiogram was performed. C2 Cobra was exchanged over the B[place] wire for [place] catheter. Micro catheter system was then used through the [place] catheter with 0.016 micro wire and 135cm Lantern micro catheter. Right/middle hepatic artery was then selected as the vasculature perfusing the area of interest. Sub selective angiogram performed of branches of the right/ middle artery. Given the significant tortuosity of the vasculature and small caliber, micro catheter was not able to advance to a distal position. Angiogram demonstrated no evidence of active extravasation. 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 Single Gel-Foam pledget infused into target artery of interest, and then withdrawal of the catheter in repeat angiogram demonstrated small additional dysplastic branches at the hepatic flexure. A course Gel-Foam slurry was then infused with no significant filling at the completion. Micro catheter was flushed, withdrawn into the superior mesenteric artery, and then angiogram of the collateral vasculature to the GDA was performed. No perfusion of the areae of interest observed. Limited angiogram of the right common femoral artery performed. Exoseal was deployed for closure. Patient tolerated the procedure well and remained hemodynamically stable throughout. No complications were encountered and no significant blood loss encountered. FINDINGS: Ultrasound survey demonstrates mild atherosclerotic changes of the right common femoral artery which is widely patent. Superior mesenteric artery angiogram demonstrates patent superior mesenteric artery, patent ileal colic  artery, patent arcades to the small bowel vasculature. There is engorged tortuous collateral flow to the gastroduodenal artery with retrograde filling of the celiac artery origin, compatible with celiac artery origin stenosis. Post stenotic dilation of the proximal  celiac artery. Splenic artery patent, left gastric artery patent, common hepatic artery patent, left hepatic artery and right hepatic artery patent. Large collateral vasculature contributed to the origin of gastroepiploic artery. Perfusion of the hepatic flexure was predominantly via right colic/middle colic branch. Sub selective angiogram of the colic branches demonstrated no active extravasation or pseudoaneurysm. The knee shunting observed with angio dysplasia. Given the tortuosity and the inability to place the tip of the micro catheter within distal branches, no proximal coil was deployed. Instead, course Gel-Foam embolization of the territory was elected as treatment to decrease the pressure to the territory. Puncture of the right common femoral artery. 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 Tortuosity and small vasculature precluded a distal catheter position into the territory for deposition of metallic coils. Deployment of Exoseal for hemostasis. Signed, Dulcy Fanny. Earleen Newport, DO [facility] Radiology Electronically Signed   By: Corrie Mckusick D.O.   On: 05/12/2016 08:22   Dg Chest Port 1 View  Result Date: 05/13/2016 CLINICAL DATA:  Dyspnea. EXAM: PORTABLE CHEST 1 VIEW COMPARISON:  Radiographs of Nov 26, 2008. FINDINGS: Stable cardiomediastinal silhouette. Atherosclerosis of thoracic aorta is noted. No pneumothorax is noted. Right lung is clear. Mild left basilar atelectasis or infiltrate is noted with minimal associated pleural effusion. Bony thorax is  unremarkable. IMPRESSION: Aortic atherosclerosis. Mild left basilar atelectasis or infiltrate is noted with minimal associated pleural effusion. Electronically Signed   By: Marijo Conception, M.D.   On: 05/13/2016 07:30   Dg Knee Left Port  Result Date: 05/12/2016 CLINICAL DATA:  Recent fall.  Pain . EXAM: PORTABLE LEFT KNEE - 1-2 VIEW COMPARISON:  No recent prior. FINDINGS: Mild tricompartment degenerative change. No acute bony or joint abnormality identified. No evidence of fracture or dislocation. IMPRESSION: Mild tricompartment degenerative change.  No acute abnormality Electronically Signed   By: Marcello Moores  Register   On: 05/12/2016 07:32   Dg Abd Portable 1v  Result Date: 05/13/2016 CLINICAL DATA:  Initial evaluation for acute mid abdominal pain. EXAM: PORTABLE ABDOMEN - 1 VIEW COMPARISON:  Prior CT from 05/11/2016. FINDINGS: Multiple prominent gas-filled loops of bowel are seen throughout the abdomen. These loops predominantly reflect the colon, although a few prominent loops of small bowel present as well. No air-fluid levels. No free air on these limited supine views. Imaging findings suggest ileus, although developing distal obstructive process not excluded. No soft tissue mass or abnormal calcification. Visualized lung bases are largely clear. No acute osseous abnormality. IMPRESSION: Multiple prominent gas-filled loops of predominantly large bowel within the abdomen. Findings are favored to reflect an ileus, although a distal obstructive process is not entirely excluded. Close interval follow-up recommended if there is high clinical concern for an the evolving obstructive process. Electronically Signed   By: Jeannine Boga M.D.   On: 05/13/2016 20:31   [place] Guide Roadmapping  Result Date: 05/12/2016 INDICATION: 82 year old male with life-threatening lower GI hemorrhage. CT angiogram demonstrates hemorrhage within the distribution the right colic  artery/middle colic artery near the hepatic flexure. EXAM: SELECTIVE VISCERAL ARTERIOGRAPHY; IR EMBO ART VEN HEMORR LYMPH EXTRAV INC GUIDE ROADMAPPING; IR ULTRASOUND GUIDANCE VASC ACCESS RIGHT; ADDITIONAL ARTERIOGRAPHY; ARTERIOGRAPHY MEDICATIONS: 100 mcg nitro, 4 mg IV Zofran. The antibiotic was administered within 1 hour of the procedure ANESTHESIA/SEDATION: Moderate (conscious) sedation was employed during this procedure. A total of  Versed 2.0 mg and Fentanyl 100 mcg was administered intravenously. Moderate Sedation Time: 90 minutes. The patient's level of consciousness and vital signs were monitored continuously by radiology nursing throughout the procedure under my direct supervision. CONTRAST:  160 cc Isovue FLUOROSCOPY TIME:  Fluoroscopy Time: 16 minutes 0 seconds (31.3 mGy). COMPLICATIONS: None PROCEDURE: Informed consent was obtained from the patient following explanation of the procedure, risks, benefits and alternatives. The patient understands, agrees and consents for the procedure. All questions were addressed. A time out was performed prior to the initiation of the procedure. Maximal barrier sterile technique utilized including caps, mask, sterile gowns, sterile gloves, large sterile drape, hand hygiene, and Betadine prep. Ultrasound survey of the right inguinal region was performed with images stored and sent to PACs. A micropuncture needle was used access the right common femoral artery under ultrasound. With excellent arterial blood flow returned, and an .018 micro wire was passed through the needle, observed enter the abdominal aorta under fluoroscopy. The needle was removed, and a micropuncture sheath was placed over the wire. The inner dilator and wire were removed, and an 035 Bentson wire was advanced under fluoroscopy into the abdominal aorta. The sheath was removed and a standard 5 [place] vascular sheath was placed. The dilator was removed and the sheath was flushed. Standard 5 French C2 Cobra  catheter was advanced over the [place] wire. Cobra catheter was used to select the superior mesenteric artery. Angiogram was performed. C2 Cobra was exchanged over the [place] wire for [place] catheter. Micro catheter system was then used through the [place] catheter with 0.016 micro wire and 135cm [place] micro catheter. Right/middle hepatic artery was then selected as the vasculature perfusing the area of interest. Sub selective angiogram performed of branches of the right/ middle artery. Given the significant tortuosity of the vasculature and small caliber, micro catheter was not able to advance to a distal position. Angiogram demonstrated no evidence of active extravasation. 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 Single Gel-Foam pledget infused into target artery of interest, and then withdrawal of the catheter in repeat angiogram demonstrated small additional dysplastic branches at the hepatic flexure. A course Gel-Foam slurry was then infused with no significant filling at the completion. Micro catheter was flushed, withdrawn into the superior mesenteric artery, and then angiogram of the collateral vasculature to the GDA was performed. No perfusion of the areae of interest observed. Limited angiogram of the right common femoral artery performed. Exoseal was deployed for closure. Patient tolerated the procedure well and remained hemodynamically stable throughout. No complications were encountered and no significant blood loss encountered. FINDINGS: Ultrasound survey demonstrates mild atherosclerotic changes of the right common femoral artery which is widely patent. Superior mesenteric artery angiogram demonstrates patent superior mesenteric artery, patent ileal colic artery, patent arcades to the small bowel vasculature. 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 Splenic artery patent, left gastric artery patent, common hepatic artery patent, left hepatic artery and right hepatic artery patent. Large collateral vasculature contributed to the origin of gastroepiploic artery. Perfusion of the hepatic flexure was predominantly via right colic/middle colic branch. Sub selective angiogram of the colic branches demonstrated no active extravasation or pseudoaneurysm. The knee shunting observed with angio dysplasia. Given the tortuosity and the inability to place the tip of the micro catheter within distal branches, no proximal coil was deployed. Instead, course Gel-Foam  embolization of the territory was elected as treatment to decrease the pressure to the territory. Puncture of the right common femoral artery. 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 Tortuosity and small vasculature precluded a distal catheter position into the territory for deposition of metallic coils. Deployment of Exoseal for hemostasis. Signed, Dulcy Fanny. Earleen Newport, DO Vascular and Interventional Radiology [facility] Radiology Electronically Signed   By: Corrie Mckusick D.O.   On: 05/12/2016 08:22   Ct Angio Abd/pel W And/or Wo Contrast  Result Date: 05/11/2016 CLINICAL DATA:  Acute onset of syncope and bright red rectal bleeding. Lightheadedness and generalized weakness. Initial encounter. EXAM: CTA ABDOMEN AND PELVIS wITHOUT AND WITH CONTRAST TECHNIQUE: Multidetector CT imaging of the abdomen and pelvis was performed using the standard protocol during bolus administration of intravenous contrast. Multiplanar reconstructed images and MIPs were obtained and reviewed to evaluate the vascular anatomy. CONTRAST:  75 mL of Isovue 370 IV contrast COMPARISON:  None. FINDINGS: VASCULAR Aorta: Scattered calcification is  seen along the abdominal aorta, without significant luminal narrowing. 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 SMA: The superior mesenteric artery appears patent, without evidence of luminal narrowing. Renals: Mild calcification is noted at the proximal renal arteries bilaterally, without significant luminal narrowing. IMA: The inferior mesenteric artery remains patent. Inflow: Scattered calcification is seen along the common and internal iliac arteries. The external iliac arteries and common femoral arteries remain fully patent bilaterally, with mild calcification at the right common femoral artery. Proximal Outflow: Minimal mural thrombus is suggested at the proximal left superficial femoral artery. Minimal mural thrombus is suggested at the proximal right profunda femoris artery. Veins: Visualized venous structures are grossly unremarkable. The inferior vena cava is partially decompressed and unremarkable in appearance. Review of the MIP images confirms the above findings. NON-VASCULAR Lower chest: Mild bibasilar atelectasis or scarring is noted. Diffuse coronary artery calcifications are seen. The visualized portions of the mediastinum are otherwise unremarkable. Hepatobiliary: The liver is unremarkable in appearance. The gallbladder is unremarkable in appearance. The common bile duct remains normal in caliber. Pancreas: The pancreas is within normal limits. Spleen: The spleen is unremarkable in appearance. Adrenals/Urinary Tract: The adrenal glands are unremarkable in appearance. A large 7.5 cm cyst is noted at the upper pole of the left kidney. Mild nonspecific perinephric stranding is noted bilaterally. Additional scattered small bilateral renal cysts are seen. Mild left-sided renal pelvicaliectasis remains within normal limits, without evidence of significant hydronephrosis. No renal or ureteral stones are identified.  Stomach/Bowel: The stomach is unremarkable in appearance. The small bowel is within normal limits. The appendix is not visualized; there is no evidence for appendicitis. Scattered diverticulosis is noted along the proximal sigmoid colon, without evidence of diverticulitis. There is a large amount of acute extravasation of contrast at the ascending colon, just proximal to the hepatic flexure of the colon, compatible with active intraluminal hemorrhage. This explains the patient's bright red blood per rectum. Lymphatic: Retroperitoneal nodes are grossly unremarkable in appearance. No pelvic sidewall lymphadenopathy is appreciated. Reproductive: The bladder is mildly distended and grossly unremarkable. The prostate remains normal in size. Other: No additional soft tissue abnormalities are seen. Musculoskeletal: No acute osseous abnormalities are identified. Vacuum phenomenon is noted at L5-S1. Underlying facet disease is noted. The visualized musculature is unremarkable in appearance. IMPRESSION: VASCULAR 1. Large amount of acute extravasation of contrast at the ascending  colon, just proximal to the hepatic flexure of the colon, compatible with active intraluminal hemorrhage. This explains the patient's bright red blood per rectum. 2. Scattered aortic atherosclerosis noted. 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 3. Diffuse coronary artery calcifications seen. NON-VASCULAR 1. Mild bibasilar atelectasis or scarring noted. 2. Scattered bilateral renal cysts, measuring up to 7.5 cm on the left. 3. Scattered diverticulosis along the proximal sigmoid colon, without evidence of diverticulitis. These results were called by telephone at the time of interpretation on 05/11/2016 at 9:58 pm to Dr. Carrie Mew, who verbally acknowledged these results. Electronically Signed   By: Garald Balding M.D.   On: 05/11/2016 22:04    Assessment/Plan 1. Acute  gastritis without hemorrhage, unspecified gastritis type  0.9% NS- given 250 mL NS bolus over 1 hour, then continuous at 75 ml/ hr x 48 hours  Insert and maintain peripheral IV for infusion of fluids  Carafate 1 gram suspension QID x 4 weeks  FU with GI MD for gastritis  Family/ staff Communication:   Total Time:  Documentation:  Face to Face:  Family/Phone:   Labs/tests ordered:  Cbc. Met b  Medication list reviewed and assessed for continued appropriateness.  Vikki Ports, NP-C Geriatrics [facility] [phone], [place] [id number] Cell Phone (Mon-Fri 8am-5pm):  [phone] On Call:  [phone] & follow prompts after 5pm & weekends Office Phone:  [phone] Office Fax:  [phone]

## 2017-04-21 ENCOUNTER — Observation Stay
Admission: RE | Admit: 2017-04-21 | Discharge: 2017-04-22 | Disposition: A | Discharge status: Home / Self Care | Payer: Medicare Other | Source: Ambulatory Visit | Attending: Cardiology | Admitting: Cardiology

## 2017-04-21 ENCOUNTER — Encounter
Admission: RE | Disposition: A | Discharge status: Home / Self Care | Payer: Self-pay | Source: Ambulatory Visit | Attending: Cardiology

## 2017-04-21 DIAGNOSIS — I25110 Atherosclerotic heart disease of native coronary artery with unstable angina pectoris: Principal | ICD-10-CM | POA: Insufficient documentation

## 2017-04-21 DIAGNOSIS — R079 Chest pain, unspecified: Secondary | ICD-10-CM | POA: Diagnosis present

## 2017-04-21 HISTORY — DX: Gastro-esophageal reflux disease without esophagitis: K21.9

## 2017-04-21 HISTORY — PX: CORONARY STENT INTERVENTION: CATH118234

## 2017-04-21 HISTORY — PX: LEFT HEART CATH AND CORONARY ANGIOGRAPHY: CATH118249

## 2017-04-21 SURGERY — LEFT HEART CATH AND CORONARY ANGIOGRAPHY
Anesthesia: Moderate Sedation

## 2017-04-21 MED ORDER — MIDAZOLAM HCL 2 MG/2ML IJ SOLN
INTRAMUSCULAR | Status: AC
  Filled 2017-04-21: qty 2

## 2017-04-21 MED ORDER — SUCRALFATE 1 G PO TABS
1.0000 g | ORAL_TABLET | Freq: Three times a day (TID) | ORAL | Status: DC
  Administered 2017-04-21 – 2017-04-22 (×4): 1 g via ORAL
  Filled 2017-04-21 (×4): qty 1

## 2017-04-21 MED ORDER — ASPIRIN 81 MG PO CHEW
CHEWABLE_TABLET | ORAL | Status: AC
  Filled 2017-04-21: qty 3

## 2017-04-21 MED ORDER — FENTANYL CITRATE (PF) 100 MCG/2ML IJ SOLN
INTRAMUSCULAR | Status: AC
  Filled 2017-04-21: qty 2

## 2017-04-21 MED ORDER — PANTOPRAZOLE SODIUM 40 MG PO TBEC
40.0000 mg | DELAYED_RELEASE_TABLET | Freq: Every day | ORAL | Status: DC
  Administered 2017-04-21 – 2017-04-22 (×2): 40 mg via ORAL
  Filled 2017-04-21 (×2): qty 1

## 2017-04-21 MED ORDER — SODIUM CHLORIDE 0.9% FLUSH: 3.0000 mL | Freq: Two times a day (BID) | INTRAVENOUS | Status: DC

## 2017-04-21 MED ORDER — HYDRALAZINE HCL 20 MG/ML IJ SOLN: 5.0000 mg | INTRAMUSCULAR | Status: AC | PRN

## 2017-04-21 MED ORDER — SODIUM CHLORIDE 0.9 % IV SOLN: 250.0000 mL | INTRAVENOUS | Status: DC | PRN

## 2017-04-21 MED ORDER — ASPIRIN 81 MG PO CHEW
81.0000 mg | CHEWABLE_TABLET | ORAL | Status: AC
  Administered 2017-04-21: 81 mg via ORAL

## 2017-04-21 MED ORDER — NITROGLYCERIN 5 MG/ML IV SOLN
INTRAVENOUS | Status: AC
  Filled 2017-04-21: qty 10

## 2017-04-21 MED ORDER — MIDAZOLAM HCL 2 MG/2ML IJ SOLN
INTRAMUSCULAR | Status: DC | PRN
  Administered 2017-04-21 (×2): 0.5 mg via INTRAVENOUS

## 2017-04-21 MED ORDER — BIVALIRUDIN TRIFLUOROACETATE 250 MG IV SOLR
INTRAVENOUS | Status: AC
  Filled 2017-04-21: qty 250

## 2017-04-21 MED ORDER — SODIUM CHLORIDE 0.9 % WEIGHT BASED INFUSION
3.0000 mL/kg/h | INTRAVENOUS | Status: AC
  Administered 2017-04-21: 3 mL/kg/h via INTRAVENOUS

## 2017-04-21 MED ORDER — CLOPIDOGREL BISULFATE 75 MG PO TABS
ORAL_TABLET | ORAL | Status: AC
  Filled 2017-04-21: qty 8

## 2017-04-21 MED ORDER — SODIUM CHLORIDE 0.9 % WEIGHT BASED INFUSION
1.0000 mL/kg/h | INTRAVENOUS | Status: AC
  Administered 2017-04-21: 1 mL/kg/h via INTRAVENOUS

## 2017-04-21 MED ORDER — CLOPIDOGREL BISULFATE 75 MG PO TABS
ORAL_TABLET | ORAL | Status: DC | PRN
  Administered 2017-04-21: 600 mg via ORAL

## 2017-04-21 MED ORDER — BIVALIRUDIN BOLUS VIA INFUSION - CUPID
INTRAVENOUS | Status: DC | PRN
  Administered 2017-04-21: 38.475 mg via INTRAVENOUS

## 2017-04-21 MED ORDER — IOPAMIDOL (ISOVUE-300) INJECTION 61%
INTRAVENOUS | Status: DC | PRN
  Administered 2017-04-21: 190 mL via INTRA_ARTERIAL

## 2017-04-21 MED ORDER — SODIUM CHLORIDE 0.9 % WEIGHT BASED INFUSION: 1.0000 mL/kg/h | INTRAVENOUS | Status: DC

## 2017-04-21 MED ORDER — SODIUM CHLORIDE 0.9% FLUSH: 3.0000 mL | INTRAVENOUS | Status: DC | PRN

## 2017-04-21 MED ORDER — CLOPIDOGREL BISULFATE 75 MG PO TABS
75.0000 mg | ORAL_TABLET | Freq: Every day | ORAL | Status: DC
  Administered 2017-04-22: 75 mg via ORAL
  Filled 2017-04-21: qty 1

## 2017-04-21 MED ORDER — NITROGLYCERIN 1 MG/10 ML FOR IR/CATH LAB
INTRA_ARTERIAL | Status: DC | PRN
  Administered 2017-04-21: 300 ug via INTRACORONARY

## 2017-04-21 MED ORDER — LABETALOL HCL 5 MG/ML IV SOLN: 10.0000 mg | INTRAVENOUS | Status: AC | PRN

## 2017-04-21 MED ORDER — FENTANYL CITRATE (PF) 100 MCG/2ML IJ SOLN
INTRAMUSCULAR | Status: DC | PRN
  Administered 2017-04-21 (×2): 25 ug via INTRAVENOUS

## 2017-04-21 MED ORDER — HEPARIN (PORCINE) IN NACL 2-0.9 UNIT/ML-% IJ SOLN
INTRAMUSCULAR | Status: AC
  Filled 2017-04-21: qty 500

## 2017-04-21 MED ORDER — ASPIRIN 81 MG PO CHEW
CHEWABLE_TABLET | ORAL | Status: DC | PRN
  Administered 2017-04-21: 243 mg via ORAL

## 2017-04-21 MED ORDER — ACETAMINOPHEN 325 MG PO TABS
650.0000 mg | ORAL_TABLET | ORAL | Status: DC | PRN
  Administered 2017-04-21: 650 mg via ORAL
  Filled 2017-04-21: qty 2

## 2017-04-21 MED ORDER — SODIUM CHLORIDE 0.9 % IV SOLN
INTRAVENOUS | Status: AC | PRN
  Administered 2017-04-21: 1.75 mg/kg/h via INTRAVENOUS

## 2017-04-21 MED ORDER — ISOSORBIDE MONONITRATE ER 30 MG PO TB24
30.0000 mg | ORAL_TABLET | Freq: Every day | ORAL | Status: DC
  Administered 2017-04-21 – 2017-04-22 (×2): 30 mg via ORAL
  Filled 2017-04-21 (×2): qty 1

## 2017-04-21 MED ORDER — ONDANSETRON HCL 4 MG/2ML IJ SOLN: 4.0000 mg | Freq: Four times a day (QID) | INTRAMUSCULAR | Status: DC | PRN

## 2017-04-21 MED ORDER — ASPIRIN 81 MG PO CHEW
CHEWABLE_TABLET | ORAL | Status: AC
  Filled 2017-04-21: qty 1

## 2017-04-21 MED ORDER — ASPIRIN 81 MG PO CHEW
81.0000 mg | CHEWABLE_TABLET | Freq: Every day | ORAL | Status: DC
  Administered 2017-04-22: 81 mg via ORAL
  Filled 2017-04-21: qty 1

## 2017-04-21 MED ORDER — SODIUM CHLORIDE 0.9% FLUSH
3.0000 mL | Freq: Two times a day (BID) | INTRAVENOUS | Status: DC
  Administered 2017-04-21 (×2): 3 mL via INTRAVENOUS

## 2017-04-21 SURGICAL SUPPLY — 18 items
BALLN TREK RX 2.25X15 (BALLOONS) ×4
BALLOON TREK RX 2.25X15 (BALLOONS) ×2 IMPLANT
CATH 5FR JL4 DIAGNOSTIC (CATHETERS) ×4 IMPLANT
CATH INFINITI 5FR ANG PIGTAIL (CATHETERS) ×4 IMPLANT
CATH INFINITI JR4 5F (CATHETERS) ×4 IMPLANT
CATH VISTA GUIDE 6FR XB3.5 (CATHETERS) ×4 IMPLANT
CATH VISTA GUIDE 6FR XBLAD3.5 (CATHETERS) ×4 IMPLANT
DEVICE CLOSURE MYNXGRIP 6/7F (Vascular Products) ×4 IMPLANT
DEVICE INFLAT 30 PLUS (MISCELLANEOUS) ×4 IMPLANT
DEVICE SAFEGUARD 24CM (GAUZE/BANDAGES/DRESSINGS) ×4 IMPLANT
KIT MANI 3VAL PERCEP (MISCELLANEOUS) ×4 IMPLANT
NEEDLE PERC 18GX7CM (NEEDLE) ×4 IMPLANT
PACK CARDIAC CATH (CUSTOM PROCEDURE TRAY) ×4 IMPLANT
SHEATH AVANTI 5FR X 11CM (SHEATH) ×4 IMPLANT
SHEATH AVANTI 6FR X 11CM (SHEATH) ×4 IMPLANT
STENT SIERRA 2.50 X 18 MM (Permanent Stent) ×4 IMPLANT
WIRE ASAHI PROWATER 180CM (WIRE) ×4 IMPLANT
WIRE EMERALD 3MM-J .035X150CM (WIRE) ×4 IMPLANT

## 2017-04-21 NOTE — Progress Notes (Signed)
Admitted from  [place] post cardiac cath and PCI,site is clean and clear without pain and hematoma,pedal pulses good,iv fluids continue,discharge on Thursday.

## 2017-04-21 NOTE — Progress Notes (Signed)
Dr. Saralyn Pilar in at bedside to speak with pt. & family. Both verbalized understanding.

## 2017-04-21 NOTE — Care Management (Signed)
Elective cardiac cath with pci without complications.  At present  does not appear to be discharging home on cost prohibitive antiplatelet medication

## 2017-04-22 ENCOUNTER — Emergency Department
Admission: EM | Admit: 2017-04-22 | Discharge: 2017-04-22 | Disposition: A | Discharge status: Home / Self Care | Payer: Medicare Other | Source: Home / Self Care | Attending: Emergency Medicine | Admitting: Emergency Medicine

## 2017-04-22 ENCOUNTER — Encounter: Payer: Self-pay | Admitting: Emergency Medicine

## 2017-04-22 ENCOUNTER — Emergency Department: Payer: Medicare Other

## 2017-04-22 DIAGNOSIS — Y929 Unspecified place or not applicable: Secondary | ICD-10-CM | POA: Insufficient documentation

## 2017-04-22 DIAGNOSIS — X58XXXA Exposure to other specified factors, initial encounter: Secondary | ICD-10-CM

## 2017-04-22 DIAGNOSIS — Z79899 Other long term (current) drug therapy: Secondary | ICD-10-CM | POA: Insufficient documentation

## 2017-04-22 DIAGNOSIS — Y999 Unspecified external cause status: Secondary | ICD-10-CM | POA: Insufficient documentation

## 2017-04-22 DIAGNOSIS — Y939 Activity, unspecified: Secondary | ICD-10-CM

## 2017-04-22 DIAGNOSIS — S71001A Unspecified open wound, right hip, initial encounter: Secondary | ICD-10-CM

## 2017-04-22 DIAGNOSIS — Z7901 Long term (current) use of anticoagulants: Secondary | ICD-10-CM | POA: Insufficient documentation

## 2017-04-22 DIAGNOSIS — Z9861 Coronary angioplasty status: Secondary | ICD-10-CM | POA: Insufficient documentation

## 2017-04-22 DIAGNOSIS — Z9889 Other specified postprocedural states: Secondary | ICD-10-CM

## 2017-04-22 DIAGNOSIS — Z87891 Personal history of nicotine dependence: Secondary | ICD-10-CM

## 2017-04-22 DIAGNOSIS — Z7982 Long term (current) use of aspirin: Secondary | ICD-10-CM

## 2017-04-22 LAB — CBC
HEMATOCRIT: 26.8 % — AB (ref 40.0–52.0)
HEMOGLOBIN: 9 g/dL — AB (ref 13.0–18.0)
MCH: 28.8 pg (ref 26.0–34.0)
MCHC: 33.5 g/dL (ref 32.0–36.0)
MCV: 85.8 fL (ref 80.0–100.0)
Platelets: 89 10*3/uL — ABNORMAL LOW (ref 150–440)
RBC: 3.12 MIL/uL — ABNORMAL LOW (ref 4.40–5.90)
RDW: 19.9 % — ABNORMAL HIGH (ref 11.5–14.5)
WBC: 4.2 10*3/uL (ref 3.8–10.6)

## 2017-04-22 LAB — BASIC METABOLIC PANEL
ANION GAP: 3 — AB (ref 5–15)
BUN: 16 mg/dL (ref 6–20)
CO2: 26 mmol/L (ref 22–32)
Calcium: 8.4 mg/dL — ABNORMAL LOW (ref 8.9–10.3)
Chloride: 110 mmol/L (ref 101–111)
Creatinine, Ser: 0.95 mg/dL (ref 0.61–1.24)
GFR calc Af Amer: >60 mL/min (ref >60)
GFR calc non Af Amer: >60 mL/min (ref >60)
GLUCOSE: 88 mg/dL (ref 65–99)
POTASSIUM: 4 mmol/L (ref 3.5–5.1)
SODIUM: 139 mmol/L (ref 135–145)

## 2017-04-22 MED ORDER — ASPIRIN 81 MG PO CHEW: 81.0000 mg | CHEWABLE_TABLET | Freq: Every day | ORAL | 5 refills | Status: DC

## 2017-04-22 MED ORDER — METOPROLOL TARTRATE 25 MG PO TABS
25.0000 mg | ORAL_TABLET | Freq: Once | ORAL | Status: AC
  Administered 2017-04-22: 25 mg via ORAL
  Filled 2017-04-22: qty 1

## 2017-04-22 MED ORDER — CLOPIDOGREL BISULFATE 75 MG PO TABS: 75.0000 mg | ORAL_TABLET | Freq: Every day | ORAL | 5 refills | Status: DC

## 2017-04-22 NOTE — Progress Notes (Signed)
Pt to be discharged today. Iv and tele removed. disch instruction and prescrips given. Awaiting for transport by son.

## 2017-04-22 NOTE — ED Provider Notes (Signed)
discussed with vascular Dr. Delana Meyer patient come back if is worse says that he can go he will follow-up in the office next week course   Nena Polio, MD 04/22/17 1836

## 2017-04-22 NOTE — Discharge Instructions (Signed)
Return for more bleeding.follow-up with Dr. Delana Meyer next week. Please call the office for an appointment. He will recheck that area and make sure that the little little leakage there the pseudoaneurysm has healed.

## 2017-04-22 NOTE — ED Notes (Signed)
Right femoral site dressed with sterile guaze and tegaderm, coban wrapped around waist to apply pressure dressing.  Site dry and intact upon discharge.

## 2017-04-22 NOTE — Progress Notes (Signed)
Patient referred to Cardiac Rehab by Dr. Saralyn Pilar.  Patient s/p outpatient cath with stent placement.  Patient has 2-vessel disease.  Found patient at the nurse's station requesting to go home and have saline locks removed.  I assisted patient back to his room and attempted to speak with him about Cardiac Rehab.  Patient fixated on going home.  I was finally able to explain to patient about Cardiac Rehab.  Patient stated, "I am 83 years old and I do all that on my own.  I do not need to attend this program at my age. I am not interested in attending."  I thanked him for his time.  Nurse in room to administer a.m. Medications, removed saline locks, and provide discharge education.    Roanna Epley, RN, BSN, [facility] Nurse Navigator

## 2017-04-22 NOTE — ED Provider Notes (Signed)
[facility] Emergency Department Provider Note  ____________________________________________  Time seen: Approximately 2:45 PM  I have reviewed the triage vital signs and the nursing notes.   HISTORY  Chief Complaint bleeding from sheath site    HPI Dustin Howell is a 83 y.o. male who complains of bleeding from the right groin area where he had a cardiac catheterization access done. Bleeding started just a few hours ago. No other acute symptoms. No chest pain shortness of breath dizziness or syncope. No fevers or chills. Bleeding is been slow and nonpulsatile. Denies lower extremity weakness or paresthesia. no back pain.     Past Medical History:  Diagnosis Date  . GERD (gastroesophageal reflux disease)   . GI bleed   . HOH (hard of hearing)      Patient Active Problem List   Diagnosis Date Noted  . Chest pain on exertion 04/21/2017  . Abnormal CT of the abdomen   . Acute encephalopathy   . Hypokalemia   . Ileus ([place])   . Abdominal pain   . Acute lower GI bleeding 05/11/2016  . Gastrointestinal hemorrhage 05/11/2016  . Acute blood loss anemia   . Arterial hypotension   . Acute pain of left knee   . Slurred speech      Past Surgical History:  Procedure Laterality Date  . APPENDECTOMY    . bilat. rotator cuff issues Bilateral    "stiff" shoulders  . CORONARY STENT INTERVENTION N/A 04/21/2017   Procedure: CORONARY STENT INTERVENTION;  Surgeon: Isaias Cowman, MD;  Location: [place] [facility];  Service: Cardiovascular;  Laterality: N/A;  . IR GENERIC HISTORICAL  05/12/2016   IR EMBO ART  VEN HEMORR LYMPH EXTRAV  INC GUIDE ROADMAPPING 05/12/2016 Corrie Mckusick, DO [facility]INTER[facility]  . IR GENERIC HISTORICAL  05/12/2016   IR ANGIOGRAM VISCERAL SELECTIVE 05/12/2016 Corrie Mckusick, DO [facility]  . IR GENERIC HISTORICAL  05/12/2016   IR US GUIDE VASC ACCESS RIGHT 05/12/2016 Corrie Mckusick, DO [facility]  . IR GENERIC HISTORICAL  05/12/2016    IR ANGIOGRAM SELECTIVE EACH ADDITIONAL VESSEL 05/12/2016 Corrie Mckusick, DO [facility]  . IR GENERIC HISTORICAL  05/12/2016   IR ANGIOGRAM SELECTIVE EACH ADDITIONAL VESSEL 05/12/2016 Corrie Mckusick, DO [facility]  . IR GENERIC HISTORICAL  05/12/2016   IR ANGIOGRAM FOLLOW UP STUDY 05/12/2016 Corrie Mckusick, DO [facility]  . IR GENERIC HISTORICAL  05/12/2016   IR ANGIOGRAM SELECTIVE EACH ADDITIONAL VESSEL 05/12/2016 Corrie Mckusick, DO [facility]  . LEFT HEART CATH AND CORONARY ANGIOGRAPHY Left 04/21/2017   Procedure: LEFT HEART CATH AND CORONARY ANGIOGRAPHY;  Surgeon: Isaias Cowman, MD;  Location: [place] [facility];  Service: Cardiovascular;  Laterality: Left;     Prior to Admission medications   Medication Sig Start Date End Date Taking? Authorizing Provider  alum & mag hydroxide-simeth (MAALOX/MYLANTA) 200-200-20 MG/5ML suspension Take 15 mLs by mouth every 4 (four) hours as needed for indigestion or heartburn. 05/19/16   Bonnielee Haff, MD  amLODipine (NORVASC) 5 MG tablet Take 1 tablet (5 mg total) by mouth daily. Patient not taking: Reported on 04/21/2017 05/20/16   Bonnielee Haff, MD  aspirin 81 MG chewable tablet Chew 1 tablet (81 mg total) by mouth daily. 04/22/17   Paraschos, Alexander, MD  clopidogrel (PLAVIX) 75 MG tablet Take 1 tablet (75 mg total) by mouth daily with breakfast. 04/22/17   Paraschos, Alexander, MD  feeding supplement, ENSURE ENLIVE, (ENSURE ENLIVE) LIQD Take 237 mLs by mouth 2 (two) times  daily between meals. Patient not taking: Reported on 04/21/2017 05/19/16   Bonnielee Haff, MD  Maltodextrin-Xanthan Gum ([place]) POWD As needed Patient not taking: Reported on 04/21/2017 05/19/16   Bonnielee Haff, MD  metoprolol tartrate (LOPRESSOR) 25 MG tablet Take 1 tablet (25 mg total) by mouth 2 (two) times daily. Patient not taking: Reported on 04/21/2017 05/19/16   Bonnielee Haff, MD  nitroGLYCERIN (NITROSTAT) 0.4 MG SL tablet Place 0.4 mg  under the tongue every 5 (five) minutes as needed for chest pain.    [provider]  thiamine 100 MG tablet Take 1 tablet (100 mg total) by mouth daily. Patient not taking: Reported on 04/21/2017 05/20/16   Bonnielee Haff, MD     Allergies Patient has no known allergies.   History reviewed. No pertinent family history.  Social History Social History  Substance Use Topics  . Smoking status: Former Smoker    Packs/day: 0.50    Types: Cigarettes    Quit date: 87  . Smokeless tobacco: Current User    Types: Snuff  . Alcohol use Yes     Comment: every evening with supper    Review of Systems  Constitutional:   No fever or chills.  ENT:   No sore throat. No rhinorrhea. Cardiovascular:   No chest pain or syncope. Respiratory:   No dyspnea or cough. Gastrointestinal:   Negative for abdominal pain, vomiting and diarrhea.  Musculoskeletal:   Negative for focal pain or swelling All other systems reviewed and are negative except as documented above in ROS and HPI.  ____________________________________________   PHYSICAL EXAM:  VITAL SIGNS: ED Triage Vitals  Enc Vitals Group     BP 04/22/17 1408 (!) 186/80     Pulse Rate 04/22/17 1408 79     Resp 04/22/17 1408 16     Temp 04/22/17 1405 98.6 F (37 C)     Temp Source 04/22/17 1405 Oral     SpO2 04/22/17 1408 98 %     Weight 04/22/17 1406 113 lb (51.3 kg)     Height 04/22/17 1406 5\' 5"  (1.651 m)     Head Circumference --      Peak Flow --      Pain Score --      Pain Loc --      Pain Edu? --      Excl. in [place]? --     Vital signs reviewed, nursing assessments reviewed.   Constitutional:   Alert and oriented. Well appearing and in no distress. Eyes:   No scleral icterus.  EOMI. ENT   Head:   Normocephalic and atraumatic. Hematological/Lymphatic/Immunilogical:   No inguinal lymphadenopathy. Cardiovascular:   RRR. Symmetric bilateral radial and DP pulses.  No murmurs. strong bounding femoral pulses  bilaterally. there is a skin puncture site overlying the right femoral artery at the inguinal ligament. There is evidence of recent bleeding from this area, although with sandbag pressure it is now hemostatic. There is subcutaneous swelling palpable without thrill. Respiratory:   Normal respiratory effort without tachypnea/retractions. Breath sounds are clear and equal bilaterally. No wheezes/rales/rhonchi. Gastrointestinal:   Soft and nontender. Non distended. There is no CVA tenderness.  No rebound, rigidity, or guarding. Genitourinary:   normal Musculoskeletal:   Normal range of motion in all extremities. No joint effusions.  No lower extremity tenderness.  No edema. Neurologic:   Normal speech and language.  Motor grossly intact. No gross focal neurologic deficits are appreciated.  Skin:    Skin  is warm, dry with bleeding site as above at the right femoral artery  ____________________________________________    LABS (pertinent positives/negatives) (all labs ordered are listed, but only abnormal results are displayed) Labs Reviewed - No data to display ____________________________________________   EKG    ____________________________________________    RADIOLOGY  No results found.  ____________________________________________   PROCEDURES Procedures  ____________________________________________   DIFFERENTIAL DIAGNOSIS  femoral artery bleeding due to closure device failure, pseudoaneurysm, cutaneous bleeding  CLINICAL IMPRESSION / ASSESSMENT AND PLAN / ED COURSE  Pertinent labs & imaging results that were available during my care of the patient were reviewed by me and considered in my medical decision making (see chart for details).   patient presents with bleeding from access site over the right femoral artery from his cardiac catheterization done yesterday. No other symptoms. No chest pain shortness of breath or back pain. Otherwise feels fine. Initially blood  pressure elevated with wide pulse pressure, possibly contributing to the bleeding, but on repeat blood pressure check is 145/75. I'll give him metoprolol orally, check ultrasound to evaluate for pseudoaneurysm.   ----------------------------------------- 3:00 PM on 04/22/2017 -----------------------------------------  Discussed with cardiology Dr. Saralyn Pilar. Agrees that if site remains hemostatic patient will be suitable for discharge home and continue outpatient follow-up. Care of the patient was signed out to Dr. Cinda Quest at 3:00 PM to follow up on ultrasound result.     ____________________________________________   FINAL CLINICAL IMPRESSION(S) / ED DIAGNOSES    Final diagnoses:  Status post cardiac catheterization  Bleeding from right hip wound, initial encounter      New Prescriptions   No medications on file     Portions of this note were generated with dragon dictation software. Dictation errors may occur despite best attempts at proofreading.    Carrie Mew, MD 04/22/17 1500

## 2017-04-22 NOTE — Discharge Summary (Signed)
   Physician Discharge Summary  Patient ID: Dustin Howell MRN: [id number] DOB/AGE: Nov 22, 1924 83 y.o.  Admit date: 04/21/2017 Discharge date: 04/22/2017  Primary Discharge Diagnosis Unstable angina Secondary Discharge Diagnosis coronary artery disease  Significant Diagnostic Studies: yes  Consults: None  Hospital Course: The patient underwent elective cardiac catheterization on 04/21/2017. Coronary angiography revealed 2 vessel coronary artery disease 90% stenosis ostium first diagonal branch and 95% stenosis proximal OM1. Decision was made to proceed with PCI OM1 stenosis and medically treat 1 stenosis due to involvement of heavily calcified mid LAD. The patient received DES of OM1 with an excellent angiographic result. He returned to telemetry floor where he had an uncomplicated hospital stay. On the morning of 04/22/2017 the patient was doing well, ambulating without difficulty, denied chest pain. BUN and creatinine were 16 and 0.95. Hemoglobin and hematocrit 9.0 and 26.8, respectively. Most recent hemoglobin and hematocrit on 02/25/2017 was 9.6 and 30.0, respectively. The patient denies any recent blood per rectum or black tarry stools.   Discharge Exam: Blood pressure (!) 108/47, pulse 62, temperature 97.8 F (36.6 C), temperature source Oral, resp. rate 17, height 5\' 5"  (1.651 m), weight 51.3 kg (113 lb), SpO2 98 %.   General appearance: alert Head: Normocephalic, without obvious abnormality, atraumatic Eyes: conjunctivae/corneas clear. PERRL, EOM's intact. Fundi benign. Ears: normal TM's and external ear canals both ears Nose: Nares normal. Septum midline. Mucosa normal. No drainage or sinus tenderness. Throat: lips, mucosa, and tongue normal; teeth and gums normal Neck: no adenopathy, no carotid bruit, no JVD, supple, symmetrical, trachea midline and thyroid not enlarged, symmetric, no tenderness/mass/nodules Back: symmetric, no curvature. ROM normal. No CVA tenderness. Resp: clear  to auscultation bilaterally Chest wall: no tenderness Cardio: regular rate and rhythm, S1, S2 normal, no murmur, click, rub or gallop GI: soft, non-tender; bowel sounds normal; no masses,  no organomegaly Pelvic: external genitalia normal, no adnexal masses or tenderness, no cervical motion tenderness, rectovaginal septum normal, uterus normal size, shape, and consistency and vagina normal without discharge Extremities: extremities normal, atraumatic, no cyanosis or edema Pulses: 2+ and symmetric Skin: Skin color, texture, turgor normal. No rashes or lesions Lymph nodes: Cervical, supraclavicular, and axillary nodes normal. Labs:   Lab Results  Component Value Date   WBC 4.2 04/22/2017   HGB 9.0 (L) 04/22/2017   HCT 26.8 (L) 04/22/2017   MCV 85.8 04/22/2017   PLT 89 (L) 04/22/2017    Recent Labs Lab 04/22/17 0446  NA 139  K 4.0  CL 110  CO2 26  BUN 16  CREATININE 0.95  CALCIUM 8.4*  GLUCOSE 88      Radiology:  EKG: Normal sinus rhythm, T-wave inversions anteriorly  FOLLOW UP PLANS AND APPOINTMENTS     BRING ALL MEDICATIONS WITH YOU TO FOLLOW UP APPOINTMENTS  Time spent with patient to include physician time: 25 minutes Signed:  Isaias Cowman MD, PhD, [facility] 04/22/2017, 8:29 AM

## 2017-04-22 NOTE — ED Notes (Signed)
Patient transported to Ultrasound 

## 2017-04-22 NOTE — ED Triage Notes (Signed)
Pt had cardiac cath yesterday with stent and started bleeding to right groin from sheath site today when got home. Sterile gauze and sand bag placed over site.

## 2017-04-22 NOTE — ED Notes (Signed)
Right femoral sheath site assessed, bruising noted, bleeding controlled at this time.

## 2017-04-22 NOTE — ED Provider Notes (Signed)
ultrasound shows a 6 mm pseudoaneurysm is partially thrombosed. Patient does not have any bleeding right at this moment after laying there with a sandbag on it. Discussed the patient with Dr. Tomasita Crumble shows he advised me to call vascular.  ----------------------------------------- 6:10 PM on 04/22/2017 -----------------------------------------  Pager system was still down doing to the fluctuations in the electrical system and so were still trying to get vascular. The CT and MRI and main x-ray R down after the lights are off   Nena Polio, MD 04/22/17 1810

## 2017-04-27 ENCOUNTER — Ambulatory Visit (INDEPENDENT_AMBULATORY_CARE_PROVIDER_SITE_OTHER): Payer: Medicare Other | Admitting: Vascular Surgery

## 2017-04-27 ENCOUNTER — Encounter (INDEPENDENT_AMBULATORY_CARE_PROVIDER_SITE_OTHER): Payer: Self-pay | Admitting: Vascular Surgery

## 2017-04-27 VITALS — BP 154/71 | HR 84 | Resp 16 | Ht 66.0 in | Wt 114.0 lb

## 2017-04-27 DIAGNOSIS — I25110 Atherosclerotic heart disease of native coronary artery with unstable angina pectoris: Secondary | ICD-10-CM | POA: Diagnosis not present

## 2017-04-27 DIAGNOSIS — I724 Aneurysm of artery of lower extremity: Secondary | ICD-10-CM | POA: Diagnosis not present

## 2017-04-27 DIAGNOSIS — I2510 Atherosclerotic heart disease of native coronary artery without angina pectoris: Secondary | ICD-10-CM | POA: Insufficient documentation

## 2017-04-27 NOTE — Assessment & Plan Note (Signed)
With recent percutaneous coronary intervention with marked improvement of his chest pain and shortness of breath.

## 2017-04-27 NOTE — Progress Notes (Signed)
Patient ID: Dustin Howell, male   DOB: 24-Apr-1925, 83 y.o.   MRN: [id number]  Chief Complaint  Patient presents with  . New Patient (Initial Visit)    ref [place] psuedo aneurysm    HPI Dustin Howell is a 83 y.o. male.  I am asked to see the patient by Dr. Saralyn Pilar for evaluation of right femoral pseudoaneurysm.  The patient reports chest pain and palpitations last week which led to a cardiac catheterization which determined severe coronary disease.  This was treated with percutaneous coronary intervention last week and the patient did well and was discharged home the following day.  Following this, he had some blistering develop in the right groin with continued bleeding.  He was found to have an approximately 6 mm partially thrombosed pseudoaneurysm of the right femoral artery.  Patient is referred for further evaluation and treatment of this.  It is mildly painful.  No fever or chills.  The patient denies any antecedent vascular work of the lower extremities.  He denies claudication or ischemic rest pain symptoms.   Past Medical History:  Diagnosis Date  . GERD (gastroesophageal reflux disease)   . GI bleed   . HOH (hard of hearing)     Past Surgical History:  Procedure Laterality Date  . APPENDECTOMY    . bilat. rotator cuff issues Bilateral    "stiff" shoulders  . CORONARY STENT INTERVENTION N/A 04/21/2017   Procedure: CORONARY STENT INTERVENTION;  Surgeon: Isaias Cowman, MD;  Location: [facility];  Service: Cardiovascular;  Laterality: N/A;  . IR GENERIC HISTORICAL  05/12/2016   IR EMBO ART  VEN HEMORR LYMPH EXTRAV  INC GUIDE ROADMAPPING 05/12/2016 Corrie Mckusick, DO [facility]  . IR GENERIC HISTORICAL  05/12/2016   IR ANGIOGRAM VISCERAL SELECTIVE 05/12/2016 Corrie Mckusick, DO [facility]  . IR GENERIC HISTORICAL  05/12/2016   IR US GUIDE VASC ACCESS RIGHT 05/12/2016 Corrie Mckusick, DO [facility]  . IR GENERIC HISTORICAL  05/12/2016   IR ANGIOGRAM SELECTIVE  EACH ADDITIONAL VESSEL 05/12/2016 Corrie Mckusick, DO [facility]  . IR GENERIC HISTORICAL  05/12/2016   IR ANGIOGRAM SELECTIVE EACH ADDITIONAL VESSEL 05/12/2016 Corrie Mckusick, DO [facility]  . IR GENERIC HISTORICAL  05/12/2016   IR ANGIOGRAM FOLLOW UP STUDY 05/12/2016 Corrie Mckusick, DO [facility]  . IR GENERIC HISTORICAL  05/12/2016   IR ANGIOGRAM SELECTIVE EACH ADDITIONAL VESSEL 05/12/2016 Corrie Mckusick, DO [facility]  . LEFT HEART CATH AND CORONARY ANGIOGRAPHY Left 04/21/2017   Procedure: LEFT HEART CATH AND CORONARY ANGIOGRAPHY;  Surgeon: Isaias Cowman, MD;  Location: [facility];  Service: Cardiovascular;  Laterality: Left;    Family History  Problem Relation Age of Onset  . Diabetes Mother   . Stroke Father   . Ovarian cancer Sister   No bleeding or clotting disorders  Social History Social History  Substance Use Topics  . Smoking status: Former Smoker    Packs/day: 0.50    Types: Cigarettes    Quit date: 84  . Smokeless tobacco: Current User    Types: Snuff  . Alcohol use Yes     Comment: every evening with supper  No IVDU  No Known Allergies  Current Outpatient Prescriptions  Medication Sig Dispense Refill  . alum & mag hydroxide-simeth (MAALOX/MYLANTA) 200-200-20 MG/5ML suspension Take 15 mLs by mouth every 4 (four) hours as needed for indigestion or heartburn. 355 mL 0  . amLODipine (NORVASC) 5 MG tablet Take 1 tablet (5  mg total) by mouth daily. 30 tablet 0  . aspirin 81 MG chewable tablet Chew 1 tablet (81 mg total) by mouth daily. 30 tablet 5  . clopidogrel (PLAVIX) 75 MG tablet Take 1 tablet (75 mg total) by mouth daily with breakfast. 30 tablet 5  . metoprolol tartrate (LOPRESSOR) 25 MG tablet Take 1 tablet (25 mg total) by mouth 2 (two) times daily.    . nitroGLYCERIN (NITROSTAT) 0.4 MG SL tablet Place 0.4 mg under the tongue every 5 (five) minutes as needed for chest pain.    . pantoprazole (PROTONIX) 40 MG tablet     . sucralfate  (CARAFATE) 1 g tablet Take by mouth.    . thiamine 100 MG tablet Take 1 tablet (100 mg total) by mouth daily.    . feeding supplement, ENSURE ENLIVE, (ENSURE ENLIVE) LIQD Take 237 mLs by mouth 2 (two) times daily between meals. (Patient not taking: Reported on 04/21/2017) 237 mL 12  . isosorbide mononitrate (IMDUR) 30 MG 24 hr tablet     . Maltodextrin-Xanthan Gum ([place]) POWD As needed (Patient not taking: Reported on 04/21/2017)     No current facility-administered medications for this visit.       REVIEW OF SYSTEMS (Negative unless checked)  Constitutional: [] Weight loss  [] Fever  [] Chills Cardiac: [x] Chest pain   [] Chest pressure   [x] Palpitations   [] Shortness of breath when laying flat   [] Shortness of breath at rest   [] Shortness of breath with exertion. Vascular:  [] Pain in legs with walking   [x] Pain in legs at rest   [] Pain in legs when laying flat   [] Claudication   [] Pain in feet when walking  [] Pain in feet at rest  [] Pain in feet when laying flat   [] History of DVT   [] Phlebitis   [x] Swelling in legs   [] Varicose veins   [] Non-healing ulcers Pulmonary:   [] Uses home oxygen   [] Productive cough   [] Hemoptysis   [] Wheeze  [] COPD   [] Asthma Neurologic:  [] Dizziness  [] Blackouts   [] Seizures   [] History of stroke   [] History of TIA  [] Aphasia   [] Temporary blindness   [] Dysphagia   [] Weakness or numbness in arms   [] Weakness or numbness in legs Musculoskeletal:  [] Arthritis   [] Joint swelling   [] Joint pain   [] Low back pain Hematologic:  [] Easy bruising  [] Easy bleeding   [] Hypercoagulable state   [] Anemic  [] Hepatitis Gastrointestinal:  [] Blood in stool   [] Vomiting blood  [] Gastroesophageal reflux/heartburn   [] Abdominal pain Genitourinary:  [] Chronic kidney disease   [] Difficult urination  [] Frequent urination  [] Burning with urination   [] Hematuria Skin:  [] Rashes   [] Ulcers   [] Wounds Psychological:  [] History of anxiety   []  History of major  depression.    Physical Exam BP (!) 154/71 (BP Location: Right Arm)   Pulse 84   Resp 16   Ht 5' 6"  (1.676 m)   Wt 114 lb (51.7 kg)   BMI 18.40 kg/m  Gen:  WD/WN, NAD.  Appears younger than stated age Head: [place]/AT, No temporalis wasting. Ear/Nose/Throat: Hearing grossly intact, nares w/o erythema or drainage, oropharynx w/o Erythema/Exudate Eyes: Conjunctiva clear, sclera non-icteric  Neck: trachea midline.  No JVD.  Pulmonary:  Good air movement, respirations not labored, no use of accessory muscles Cardiac: RRR, normal S1, S2 Vascular: Small pulsatile mass in the right inguinal region consistent with the known pseudoaneurysm.  Mild skin thinning in the area but no open wound Vessel Right Left  Radial Palpable Palpable                  Femoral Palpable, enlarged as above Palpable  Popliteal Palpable Palpable           Gastrointestinal: soft, non-tender/non-distended.  Musculoskeletal: M/S 5/5 throughout.  Extremities without ischemic changes.  No deformity or atrophy. No edema. Neurologic: Sensation grossly intact in extremities.  Symmetrical.  Speech is fluent. Motor exam as listed above. Psychiatric: Judgment intact, Mood & affect appropriate for pt's clinical situation. Dermatologic: No rashes or ulcers noted.  No cellulitis or open wounds.    Radiology [place] Lower Ext Art Right Ltd  Result Date: 04/22/2017 CLINICAL DATA:  83 year old male with a history of right inguinal bleeding from right femoral arterial access yesterday for cardiac catheterization EXAM: RIGHT LOWER EXTREMITY ARTERIAL DUPLEX SCAN TECHNIQUE: Gray-scale sonography as well as color Doppler and duplex ultrasound was performed to evaluate the right common femoral artery. COMPARISON:  None. FINDINGS: Grayscale and color duplex ultrasound performed in the region of the right common femoral artery. Common femoral artery patent. Extension of color flow anteriorly to the right common femoral artery lumen  compatible with small partially thrombosed pseudoaneurysm. Greatest estimated diameter measures 6 mm. IMPRESSION: Partial thrombosed pseudoaneurysm at the right common femoral artery with greatest diameter measuring 6 mm. These results were called by telephone at the time of interpretation on 04/22/2017 at 4:15 pm to Dr. Cinda Quest, who verbally acknowledged these results. Signed, Dulcy Fanny. Earleen [facility], DO [facility] Radiology Electronically Signed   By: Corrie Mckusick D.O.   On: 04/22/2017 16:16    Labs Recent Results (from the past 2160 hour(s))  Basic metabolic panel     Status: Abnormal   Collection Time: 04/22/17  4:46 AM  Result Value Ref Range   Sodium 139 135 - 145 mmol/L   Potassium 4.0 3.5 - 5.1 mmol/L   Chloride 110 101 - 111 mmol/L   CO2 26 22 - 32 mmol/L   Glucose, Bld 88 65 - 99 mg/dL   BUN 16 6 - 20 mg/dL   Creatinine, Ser 0.95 0.61 - 1.24 mg/dL   Calcium 8.4 (L) 8.9 - 10.3 mg/dL   GFR calc non Af Amer >60 >60 mL/min   GFR calc Af Amer >60 >60 mL/min    Comment: (NOTE) The eGFR has been calculated using the CKD EPI equation. This calculation has not been validated in all clinical situations. eGFR's persistently <60 mL/min signify possible Chronic Kidney Disease.    Anion gap 3 (L) 5 - 15  CBC     Status: Abnormal   Collection Time: 04/22/17  4:46 AM  Result Value Ref Range   WBC 4.2 3.8 - 10.6 K/uL   RBC 3.12 (L) 4.40 - 5.90 MIL/uL   Hemoglobin 9.0 (L) 13.0 - 18.0 g/dL   HCT 26.8 (L) 40.0 - 52.0 %   MCV 85.8 80.0 - 100.0 fL   MCH 28.8 26.0 - 34.0 pg   MCHC 33.5 32.0 - 36.0 g/dL   RDW 19.9 (H) 11.5 - 14.5 %   Platelets 89 (L) 150 - 440 K/uL    Assessment/Plan:  CAD (coronary artery disease) With recent percutaneous coronary intervention with marked improvement of his chest pain and shortness of breath.  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[place]) The patient has a small, partially thrombosed right femoral pseudoaneurysm but  this has not resolved over the past 5 days after his intervention.  I have had a long discussion  today with the patient regarding treatment options for the pseudoaneurysm a thrombin injection or an ultrasound-guided compression would be the least invasive options, and given his small size, the small size of the pseudoaneurysm which is already partially thrombosed, I would recommend ultrasound-guided compression as a first-line therapy.  If this does not resolve the pseudoaneurysm, I would then perform an angiogram with consideration for endovascular therapy if the lesion is far enough from the femoral bifurcation.  If endovascular therapy is not an option, open surgical therapy would be required.  We will be proceeding with these options in an expedient fashion and we will get his ultrasound-guided compression performed this week.  If this fails, he would have an angiogram later this week for the first of next week.  The patient voices his understanding and is agreeable to proceed.      Leotis Pain 04/27/2017, 4:33 PM   This note was created with Dragon medical transcription system.  Any errors from dictation are unintentional.

## 2017-04-27 NOTE — Patient Instructions (Signed)
Pseudoaneurysm An aneurysm is a bulge in an artery. A pseudoaneurysm happens when an artery is injured and blood leaks out to form a sac-like bulge. The bulge can break open, causing bleeding in the nearby tissues. What are the causes? The most common cause of this condition is a procedure such as an angiogram in which a thin tube (catheter) is inserted into an artery. After an angiogram, the insertion site on the artery should close back up all the way. If it does not, blood may leak out of the artery. Other causes of a pseudoaneurysminclude:  Trauma to the walls of an artery, such as from a stabbing injury or a deep cut.  Bypass artery grafting surgery, which is a type of surgery that makes blood flow to the heart better.  An infection that affects the walls of an artery.  A heart attack (myocardial infarction).  An aneurysm that bursts (ruptures).  What are the signs or symptoms? Symptoms of this condition include:  Pain, soreness, or tenderness at the site of the pseudoaneurysm.  Swelling.  Bruising or a change in skin color.  A throbbing mass or lump at the site.  How is this diagnosed? This condition may be diagnosed based on your symptoms, a physical exam, and an imaging test called a Doppler ultrasound. This imaging test uses sound waves to show the blood flow in the arteries and the pseudoaneurysm. How is this treated? This condition may go away on its own without treatment. To help prevent bleeding that cannot be controlled, or to help prevent other problems, your health care provider may suggest one of these treatments:  Injecting a blood-clotting enzyme, such as thrombin, into the site.  Fixing the artery with surgery.  Putting pressure (compression) on the pseudoaneurysm.  Follow these instructions at home:  Take over-the-counter and prescription medicines only as told by your health care provider.  Return to your normal activities as told by your health care  provider. Ask your health care provider what activities are safe for you.  Keep all follow-up visits as told by your health care provider. This is important. Contact a health care provider if:  Your pain, soreness, or tenderness at the pseudoaneurysm site keeps getting worse.  You have swelling at the site. Get help right away if:  You have severe or ongoing (persistent) pain at the site of the pseudoaneurysm.  There is bleeding or drainage from the site.  The part of your body where the pseudoaneurysm is located changes color or becomes painful, cold, or numb.  You have chest pain or shortness of breath.  You feel like you might faint or you faint. This information is not intended to replace advice given to you by your health care provider. Make sure you discuss any questions you have with your health care provider. Document Released: 12/16/2007 Document Revised: 04/10/2016 Document Reviewed: 04/10/2016 Elsevier Interactive Patient Education  2018 [employer]  

## 2017-04-27 NOTE — Assessment & Plan Note (Signed)
The patient has a small, partially thrombosed right femoral pseudoaneurysm but this has not resolved over the past 5 days after his intervention.  I have had a long discussion today with the patient regarding treatment options for the pseudoaneurysm a thrombin injection or an ultrasound-guided compression would be the least invasive options, and given his small size, the small size of the pseudoaneurysm which is already partially thrombosed, I would recommend ultrasound-guided compression as a first-line therapy.  If this does not resolve the pseudoaneurysm, I would then perform an angiogram with consideration for endovascular therapy if the lesion is far enough from the femoral bifurcation.  If endovascular therapy is not an option, open surgical therapy would be required.  We will be proceeding with these options in an expedient fashion and we will get his ultrasound-guided compression performed this week.  If this fails, he would have an angiogram later this week for the first of next week.  The patient voices his understanding and is agreeable to proceed.

## 2017-04-28 ENCOUNTER — Ambulatory Visit (INDEPENDENT_AMBULATORY_CARE_PROVIDER_SITE_OTHER): Payer: Medicare Other

## 2017-04-28 DIAGNOSIS — I724 Aneurysm of artery of lower extremity: Secondary | ICD-10-CM | POA: Diagnosis not present

## 2017-05-05 ENCOUNTER — Encounter (INDEPENDENT_AMBULATORY_CARE_PROVIDER_SITE_OTHER): Payer: Self-pay

## 2018-03-13 DIAGNOSIS — D479 Neoplasm of uncertain behavior of lymphoid, hematopoietic and related tissue, unspecified: Secondary | ICD-10-CM

## 2018-03-13 DIAGNOSIS — D509 Iron deficiency anemia, unspecified: Secondary | ICD-10-CM

## 2018-03-13 HISTORY — DX: Iron deficiency anemia, unspecified: D50.9

## 2018-03-13 HISTORY — DX: Neoplasm of uncertain behavior of lymphoid, hematopoietic and related tissue, unspecified: D47.9

## 2018-03-15 ENCOUNTER — Inpatient Hospital Stay: Payer: Medicare Other

## 2018-03-15 ENCOUNTER — Encounter (INDEPENDENT_AMBULATORY_CARE_PROVIDER_SITE_OTHER): Payer: Self-pay

## 2018-03-15 ENCOUNTER — Encounter: Payer: Self-pay | Admitting: Oncology

## 2018-03-15 ENCOUNTER — Inpatient Hospital Stay: Payer: Medicare Other | Attending: Oncology | Admitting: Oncology

## 2018-03-15 VITALS — BP 155/74 | HR 81 | Temp 97.4°F | Resp 8 | Ht 66.0 in | Wt 113.4 lb

## 2018-03-15 DIAGNOSIS — D72821 Monocytosis (symptomatic): Secondary | ICD-10-CM | POA: Diagnosis not present

## 2018-03-15 DIAGNOSIS — Z79899 Other long term (current) drug therapy: Secondary | ICD-10-CM | POA: Diagnosis not present

## 2018-03-15 DIAGNOSIS — Z955 Presence of coronary angioplasty implant and graft: Secondary | ICD-10-CM | POA: Insufficient documentation

## 2018-03-15 DIAGNOSIS — Z87891 Personal history of nicotine dependence: Secondary | ICD-10-CM | POA: Insufficient documentation

## 2018-03-15 DIAGNOSIS — M1990 Unspecified osteoarthritis, unspecified site: Secondary | ICD-10-CM | POA: Diagnosis not present

## 2018-03-15 DIAGNOSIS — I2510 Atherosclerotic heart disease of native coronary artery without angina pectoris: Secondary | ICD-10-CM | POA: Insufficient documentation

## 2018-03-15 DIAGNOSIS — E876 Hypokalemia: Secondary | ICD-10-CM | POA: Insufficient documentation

## 2018-03-15 DIAGNOSIS — D696 Thrombocytopenia, unspecified: Secondary | ICD-10-CM | POA: Diagnosis not present

## 2018-03-15 DIAGNOSIS — K219 Gastro-esophageal reflux disease without esophagitis: Secondary | ICD-10-CM | POA: Diagnosis not present

## 2018-03-15 DIAGNOSIS — Z7982 Long term (current) use of aspirin: Secondary | ICD-10-CM | POA: Insufficient documentation

## 2018-03-15 LAB — CBC WITH DIFFERENTIAL/PLATELET
BASOS ABS: 0 10*3/uL (ref 0–0.1)
Basophils Relative: 0 %
EOS ABS: 0.2 10*3/uL (ref 0–0.7)
Eosinophils Relative: 4 %
HCT: 29.8 % — ABNORMAL LOW (ref 40.0–52.0)
HEMOGLOBIN: 9.8 g/dL — AB (ref 13.0–18.0)
Lymphocytes Relative: 45 %
Lymphs Abs: 2.6 10*3/uL (ref 1.0–3.6)
MCH: 29.6 pg (ref 26.0–34.0)
MCHC: 33 g/dL (ref 32.0–36.0)
MCV: 89.8 fL (ref 80.0–100.0)
MONO ABS: 0.6 10*3/uL (ref 0.2–1.0)
MONOS PCT: 10 %
Neutro Abs: 2.3 10*3/uL (ref 1.4–6.5)
Neutrophils Relative %: 41 %
Platelets: 115 10*3/uL — ABNORMAL LOW (ref 150–440)
RBC: 3.32 MIL/uL — ABNORMAL LOW (ref 4.40–5.90)
RDW: 18.3 % — ABNORMAL HIGH (ref 11.5–14.5)
WBC: 5.7 10*3/uL (ref 3.8–10.6)

## 2018-03-15 LAB — IRON AND TIBC
IRON: 31 ug/dL — AB (ref 45–182)
Saturation Ratios: 7 % — ABNORMAL LOW (ref 17.9–39.5)
TIBC: 418 ug/dL (ref 250–450)
UIBC: 387 ug/dL

## 2018-03-15 LAB — COMPREHENSIVE METABOLIC PANEL
ALBUMIN: 4.1 g/dL (ref 3.5–5.0)
ALK PHOS: 59 U/L (ref 38–126)
ALT: 15 U/L (ref 0–44)
AST: 25 U/L (ref 15–41)
Anion gap: 8 (ref 5–15)
BILIRUBIN TOTAL: 0.7 mg/dL (ref 0.3–1.2)
BUN: 25 mg/dL — AB (ref 8–23)
CO2: 24 mmol/L (ref 22–32)
Calcium: 9 mg/dL (ref 8.9–10.3)
Chloride: 108 mmol/L (ref 98–111)
Creatinine, Ser: 1.18 mg/dL (ref 0.61–1.24)
GFR calc Af Amer: >60 mL/min (ref >60)
GFR calc non Af Amer: 52 mL/min — ABNORMAL LOW (ref >60)
GLUCOSE: 103 mg/dL — AB (ref 70–99)
POTASSIUM: 4.1 mmol/L (ref 3.5–5.1)
Sodium: 140 mmol/L (ref 135–145)
TOTAL PROTEIN: 7 g/dL (ref 6.5–8.1)

## 2018-03-15 LAB — LACTATE DEHYDROGENASE: LDH: 186 U/L (ref 98–192)

## 2018-03-15 LAB — FERRITIN: Ferritin: 16 ng/mL — ABNORMAL LOW (ref 24–336)

## 2018-03-15 LAB — TSH: TSH: 2.905 u[IU]/mL (ref 0.350–4.500)

## 2018-03-15 LAB — RETICULOCYTES
RBC.: 3.36 MIL/uL — AB (ref 4.40–5.90)
RETIC COUNT ABSOLUTE: 63.8 10*3/uL (ref 19.0–183.0)
RETIC CT PCT: 1.9 % (ref 0.4–3.1)

## 2018-03-15 LAB — VITAMIN B12: Vitamin B-12: 368 pg/mL (ref 180–914)

## 2018-03-15 LAB — FOLATE: Folate: 44 ng/mL (ref >5.9)

## 2018-03-15 NOTE — Progress Notes (Signed)
The patient has tremors noted to bilateral hands

## 2018-03-15 NOTE — Progress Notes (Signed)
Hematology/Oncology Consult note [facility] [phone] [phone]  Patient Care Team: [name], MD as PCP - General (Family Medicine)   Name of the patient: Dustin Howell  [id number]  15-Feb-1925    Reason for referral- monocytosis/ thrombocytopenia   Referring physician- Dr. Netty Starring  Date of visit: 03/15/18   History of presenting illness-patient is a 84 year old male with a past medical history significant for coronary artery disease, osteoarthritis, resting tremors who is been referred to [place] for monocytosis and thrombocytopenia.  Most recent CBC from 03/07/2018 showed a white count of 6.7, H&H of 10.5/33.6 with an MCV of 93.1 and a platelet count of 120.  Patient has had chronic thrombocytopenia and his platelet counts are between 100-1 40 since 2017.  He has also had moderate anemia with a hemoglobin between 9-10 since 2017.  Recent differential on the CBC showed 22% monocytes with an absolute monocyte count of 1.5.  Some variant lymphocytes were seen and absolute lymphocyte count was elevated at 34.  Of note patient has had monocytosis and lymphocytosis with relative neutropenia over the last 1-1/2-year.   Peripheral smear reviewed by pathology Dr. Dicie Beam suggested mild thrombocytopenia and anemia.  There are circulating neoplastic lymphocytes with cytoplasmic projections.  Atypical lymphocytes were observed on a blood smear in 2017.  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  If clinically indicated further work-up should include lymphocyte immunophenotyping by flow cytometry.  Patient reports generalized itching over the last 2 years which has gotten progressively worse in the last couple of months.  He states that he itches everywhere from head to toe and nothing has really helped this.  His appetite is good and he denies any unintentional weight  loss.  ECOG PS- 2  Pain scale- 0   Review of systems- Review of Systems  Constitutional: Positive for malaise/fatigue. Negative for chills, fever and weight loss.  HENT: Negative for congestion, ear discharge and nosebleeds.   Eyes: Negative for blurred vision.  Respiratory: Negative for cough, hemoptysis, sputum production, shortness of breath and wheezing.   Cardiovascular: Negative for chest pain, palpitations, orthopnea and claudication.  Gastrointestinal: Negative for abdominal pain, blood in stool, constipation, diarrhea, heartburn, melena, nausea and vomiting.  Genitourinary: Negative for dysuria, flank pain, frequency, hematuria and urgency.  Musculoskeletal: Negative for back pain, joint pain and myalgias.  Skin: Positive for itching. Negative for rash.  Neurological: Negative for dizziness, tingling, focal weakness, seizures, weakness and headaches.  Endo/Heme/Allergies: Does not bruise/bleed easily.  Psychiatric/Behavioral: Negative for depression and suicidal ideas. The patient does not have insomnia.     No Known Allergies  Patient Active Problem List   Diagnosis Date Noted  . CAD (coronary artery disease) 04/27/2017  . 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 Chest pain on exertion 04/21/2017  . Abnormal CT of the abdomen   . Acute encephalopathy   . Hypokalemia   . Ileus ([place])   . Abdominal pain   . Acute lower GI bleeding 05/11/2016  . Gastrointestinal hemorrhage 05/11/2016  . Acute blood loss anemia   . Arterial hypotension   . Acute pain of left knee   . Slurred speech      Past Medical History:  Diagnosis Date  . GERD (gastroesophageal reflux disease)   . GI bleed   . HOH (hard of hearing)      Past Surgical History:  Procedure Laterality Date  . APPENDECTOMY    .  bilat. rotator cuff issues Bilateral    "stiff" shoulders  . CORONARY STENT INTERVENTION N/A 04/21/2017   Procedure: CORONARY STENT INTERVENTION;  Surgeon: Isaias Cowman, MD;  Location: [place] [facility];  Service: Cardiovascular;  Laterality: N/A;  . IR GENERIC HISTORICAL  05/12/2016   IR EMBO ART  VEN HEMORR LYMPH EXTRAV  INC GUIDE ROADMAPPING 05/12/2016 Corrie Mckusick, DO [facility]  . IR GENERIC HISTORICAL  05/12/2016   IR ANGIOGRAM VISCERAL SELECTIVE 05/12/2016 Corrie Mckusick, DO [facility]  . IR GENERIC HISTORICAL  05/12/2016   IR US GUIDE VASC ACCESS RIGHT 05/12/2016 Corrie Mckusick, DO [facility]  . IR GENERIC HISTORICAL  05/12/2016   IR ANGIOGRAM SELECTIVE EACH ADDITIONAL VESSEL 05/12/2016 Corrie Mckusick, DO [facility]  . IR GENERIC HISTORICAL  05/12/2016   IR ANGIOGRAM SELECTIVE EACH ADDITIONAL VESSEL 05/12/2016 Corrie Mckusick, DO [facility]  . IR GENERIC HISTORICAL  05/12/2016   IR ANGIOGRAM FOLLOW UP STUDY 05/12/2016 Corrie Mckusick, DO [facility]  . IR GENERIC HISTORICAL  05/12/2016   IR ANGIOGRAM SELECTIVE EACH ADDITIONAL VESSEL 05/12/2016 Corrie Mckusick, DO [facility]  . LEFT HEART CATH AND CORONARY ANGIOGRAPHY Left 04/21/2017   Procedure: LEFT HEART CATH AND CORONARY ANGIOGRAPHY;  Surgeon: Isaias Cowman, MD;  Location: [place] [facility];  Service: Cardiovascular;  Laterality: Left;    Social History   Socioeconomic History  . Marital status: Widowed    Spouse name: Not on file  . Number of children: Not on file  . Years of education: Not on file  . Highest education level: Not on file  Occupational History  . Not on file  Social Needs  . Financial resource strain: Not on file  . Food insecurity:    Worry: Not on file    Inability: Not on file  . Transportation needs:    Medical: Not on file    Non-medical: Not on file  Tobacco Use  . Smoking status: Former Smoker    Packs/day: 0.50    Types: Cigarettes    Last attempt to quit: 1957    Years since quitting: 62.7  . Smokeless tobacco: Current User    Types: Snuff  Substance and Sexual Activity  . Alcohol use: Yes    Comment: every evening with  supper  . Drug use: No  . Sexual activity: Never  Lifestyle  . Physical activity:    Days per week: Not on file    Minutes per session: Not on file  . Stress: Not on file  Relationships  . Social connections:    Talks on phone: Not on file    Gets together: Not on file    Attends religious service: Not on file    Active member of club or organization: Not on file    Attends meetings of clubs or organizations: Not on file    Relationship status: Not on file  . Intimate partner violence:    Fear of current or ex partner: Not on file    Emotionally abused: Not on file    Physically abused: Not on file    Forced sexual activity: Not on file  Other Topics Concern  . Not on file  Social History Narrative  . Not on file     Family History  Problem Relation Age of Onset  . Diabetes Mother   . Stroke Father   . Ovarian cancer Sister      Current Outpatient Medications:  .  alum & mag hydroxide-simeth (MAALOX/MYLANTA) 200-200-20  MG/5ML suspension, Take 15 mLs by mouth every 4 (four) hours as needed for indigestion or heartburn., Disp: 355 mL, Rfl: 0 .  amLODipine (NORVASC) 5 MG tablet, Take 1 tablet (5 mg total) by mouth daily., Disp: 30 tablet, Rfl: 0 .  aspirin 81 MG chewable tablet, Chew 1 tablet (81 mg total) by mouth daily., Disp: 30 tablet, Rfl: 5 .  clopidogrel (PLAVIX) 75 MG tablet, Take 1 tablet (75 mg total) by mouth daily with breakfast., Disp: 30 tablet, Rfl: 5 .  feeding supplement, ENSURE ENLIVE, (ENSURE ENLIVE) LIQD, Take 237 mLs by mouth 2 (two) times daily between meals. (Patient not taking: Reported on 04/21/2017), Disp: 237 mL, Rfl: 12 .  isosorbide mononitrate (IMDUR) 30 MG 24 hr tablet, , Disp: , Rfl:  .  Maltodextrin-Xanthan Gum ([place]) POWD, As needed (Patient not taking: Reported on 04/21/2017), Disp: , Rfl:  .  metoprolol tartrate (LOPRESSOR) 25 MG tablet, Take 1 tablet (25 mg total) by mouth 2 (two) times daily., Disp: , Rfl:  .   nitroGLYCERIN (NITROSTAT) 0.4 MG SL tablet, Place 0.4 mg under the tongue every 5 (five) minutes as needed for chest pain., Disp: , Rfl:  .  pantoprazole (PROTONIX) 40 MG tablet, , Disp: , Rfl:  .  sucralfate (CARAFATE) 1 g tablet, Take by mouth., Disp: , Rfl:  .  thiamine 100 MG tablet, Take 1 tablet (100 mg total) by mouth daily., Disp: , Rfl:    Physical exam: There were no vitals filed for this visit. Physical Exam  Constitutional: He is oriented to person, place, and time.  Thin elderly gentleman in no acute distress  HENT:  Head: Normocephalic and atraumatic.  Eyes: Pupils are equal, round, and reactive to light. EOM are normal.  Neck: Normal range of motion.  Cardiovascular: Normal rate, regular rhythm and normal heart sounds.  Pulmonary/Chest: Effort normal and breath sounds normal.  Abdominal: Soft. Bowel sounds are normal. He exhibits no distension. There is no tenderness.  No palpable hepatosplenomegaly  Lymphadenopathy:  No palpable cervical, supraclavicular, axillary or inguinal adenopathy  Neurological: He is alert and oriented to person, place, and time.  Skin: Skin is warm and dry.       CMP Latest Ref Rng & Units 04/22/2017  Glucose 65 - 99 mg/dL 88  BUN 6 - 20 mg/dL 16  Creatinine 0.61 - 1.24 mg/dL 0.95  Sodium 135 - 145 mmol/L 139  Potassium 3.5 - 5.1 mmol/L 4.0  Chloride 101 - 111 mmol/L 110  CO2 22 - 32 mmol/L 26  Calcium 8.9 - 10.3 mg/dL 8.4(L)  Total Protein 6.5 - 8.1 g/dL -  Total Bilirubin 0.3 - 1.2 mg/dL -  Alkaline Phos 38 - 126 U/L -  AST 15 - 41 U/L -  ALT 17 - 63 U/L -   CBC Latest Ref Rng & Units 04/22/2017  WBC 3.8 - 10.6 K/uL 4.2  Hemoglobin 13.0 - 18.0 g/dL 9.0(L)  Hematocrit 40.0 - 52.0 % 26.8(L)  Platelets 150 - 440 K/uL 89(L)     Assessment and plan- Patient is a 84 y.o. male referred for monocytosis and thrombocytopenia  Over the last 1 year patient has had monocytosis and lymphocytosis with relative neutropenia.  His monocyte  count on most recent check was in the upper limit of normal at 1.5.  His white count is normal.  He also has moderate anemia with a hemoglobin between 9-10 and chronic thrombocytopenia over the last 2 years which has essentially remained unchanged.  For his monocytosis: I will check a CBC with differential, pathology review of smear, flow cytometry and BCR abl fish testing.  I will also check ANA comprehensive panel serum tryptase and LDH.  Findings of peripheral smear are concerning for low-grade lymphoproliferative disorder versus hairy cell leukemia. He may need bone marrow biopsy based on flow cytometry findings  For his anemia and thrombocytopenia I will check CBC with iron studies, ferritin, B12 and folate, CMP, myeloma panel, serum free light chains, reticulocyte count, tsh and haptoglobin.  I will see him back in 2 weeks time  Thank you for this kind referral and the opportunity to participate in the care of this patient   Visit Diagnosis 1. Monocytosis   2. Thrombocytopenia ([place])     Dr. Randa Evens, MD, MPH [facility] [phone] 03/15/2018  3:06 PM

## 2018-03-16 LAB — KAPPA/LAMBDA LIGHT CHAINS
KAPPA FREE LGHT CHN: 30.9 mg/L — AB (ref 3.3–19.4)
Kappa, lambda light chain ratio: 2.05 — ABNORMAL HIGH (ref 0.26–1.65)
LAMDA FREE LIGHT CHAINS: 15.1 mg/L (ref 5.7–26.3)

## 2018-03-16 LAB — ANA COMPREHENSIVE PANEL
Anti JO-1: <0.2 AI (ref 0.0–0.9)
Chromatin Ab SerPl-aCnc: <0.2 AI (ref 0.0–0.9)
SSA (Ro) (ENA) Antibody, IgG: <0.2 AI (ref 0.0–0.9)
Scleroderma (Scl-70) (ENA) Antibody, IgG: <0.2 AI (ref 0.0–0.9)
ds DNA Ab: <1 IU/mL (ref 0–9)

## 2018-03-16 LAB — MULTIPLE MYELOMA PANEL, SERUM
ALBUMIN/GLOB SERPL: 1.6 (ref 0.7–1.7)
ALPHA2 GLOB SERPL ELPH-MCNC: 0.6 g/dL (ref 0.4–1.0)
Albumin SerPl Elph-Mcnc: 4 g/dL (ref 2.9–4.4)
Alpha 1: 0.3 g/dL (ref 0.0–0.4)
B-Globulin SerPl Elph-Mcnc: 0.9 g/dL (ref 0.7–1.3)
Gamma Glob SerPl Elph-Mcnc: 0.8 g/dL (ref 0.4–1.8)
Globulin, Total: 2.6 g/dL (ref 2.2–3.9)
IGM (IMMUNOGLOBULIN M), SRM: 50 mg/dL (ref 15–143)
IgA: 192 mg/dL (ref 61–437)
IgG (Immunoglobin G), Serum: 1005 mg/dL (ref 700–1600)
TOTAL PROTEIN ELP: 6.6 g/dL (ref 6.0–8.5)

## 2018-03-16 LAB — HAPTOGLOBIN: HAPTOGLOBIN: 156 mg/dL (ref 34–200)

## 2018-03-17 LAB — COMP PANEL: LEUKEMIA/LYMPHOMA: Immunophenotypic Profile: 18

## 2018-03-17 LAB — TRYPTASE: Tryptase: 10.5 ug/L (ref 2.2–13.2)

## 2018-03-24 LAB — BCR-ABL1 FISH
CELLS COUNTED: 200
Cells Analyzed: 200

## 2018-04-01 ENCOUNTER — Inpatient Hospital Stay (HOSPITAL_BASED_OUTPATIENT_CLINIC_OR_DEPARTMENT_OTHER): Payer: Medicare Other | Admitting: Oncology

## 2018-04-01 ENCOUNTER — Encounter: Payer: Self-pay | Admitting: Oncology

## 2018-04-01 VITALS — BP 157/73 | HR 82 | Temp 97.9°F | Resp 18 | Ht 66.0 in | Wt 113.0 lb

## 2018-04-01 DIAGNOSIS — D479 Neoplasm of uncertain behavior of lymphoid, hematopoietic and related tissue, unspecified: Secondary | ICD-10-CM | POA: Diagnosis not present

## 2018-04-01 DIAGNOSIS — L299 Pruritus, unspecified: Secondary | ICD-10-CM

## 2018-04-01 DIAGNOSIS — D696 Thrombocytopenia, unspecified: Secondary | ICD-10-CM | POA: Diagnosis not present

## 2018-04-01 DIAGNOSIS — D509 Iron deficiency anemia, unspecified: Secondary | ICD-10-CM | POA: Diagnosis not present

## 2018-04-01 DIAGNOSIS — D72821 Monocytosis (symptomatic): Secondary | ICD-10-CM | POA: Diagnosis not present

## 2018-04-01 DIAGNOSIS — D47Z9 Other specified neoplasms of uncertain behavior of lymphoid, hematopoietic and related tissue: Secondary | ICD-10-CM

## 2018-04-04 NOTE — Progress Notes (Signed)
Hematology/Oncology Consult note [facility]  Telephone:([phone] Fax:([phone]  Patient Care Team: [name], MD as PCP - General (Family Medicine)   Name of the patient: Dustin Howell  [id number]  Aug 02, 1924   Date of visit: 04/04/18  Diagnosis- monocytosis and thrombocytopenia probably due to low grade lymphoproliferative disorder  Chief complaint/ Reason for visit- discuss results of blood work  Heme/Onc history: patient is a 84 year old male with a past medical history significant for coronary artery disease, osteoarthritis, resting tremors who is been referred to [place] for monocytosis and thrombocytopenia.  Most recent CBC from 03/07/2018 showed a white count of 6.7, H&H of 10.5/33.6 with an MCV of 93.1 and a platelet count of 120.  Patient has had chronic thrombocytopenia and his platelet counts are between 100-1 40 since 2017.  He has also had moderate anemia with a hemoglobin between 9-10 since 2017.  Recent differential on the CBC showed 22% monocytes with an absolute monocyte count of 1.5.  Some variant lymphocytes were seen and absolute lymphocyte count was elevated at 34.  Of note patient has had monocytosis and lymphocytosis with relative neutropenia over the last 1-1/2-year.   Peripheral smear reviewed by pathology Dr. Dicie Beam suggested mild thrombocytopenia and anemia.  There are circulating neoplastic lymphocytes with cytoplasmic projections.  Atypical lymphocytes were observed on a blood smear in 2017.  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  If clinically indicated further work-up should include lymphocyte immunophenotyping by flow cytometry.  Patient reports generalized itching over the last 2 years which has gotten progressively worse in the last couple of months.  He states that he itches everywhere from head to toe and nothing has really helped this.   His appetite is good and he denies any unintentional weight loss.  Results of blood work from 03/15/2018 were as follows: CBC showed white count of 5.7, H&H of 9.8/29.8 with an MCV of 89.8 and a platelet count of 115.  CMP was within normal limits.  B12 and folate was normal.  Ferritin was low at 16 iron study showed a low iron saturation of 7%.  Reticulocyte count was low normal at 1.9%.  Multiple myeloma panel did not reveal any monoclonal protein.  Haptoglobin was normal at 156.  TSH was normal at 2.9.  Serum free kappa was elevated at 30.9 with a kappa lambda light chain ratio of 2.05.  FISH for BCR abl was negative.  ANA comprehensive panel was negative.  LDH was normal and tryptase level was normal.  Flow cytometry showed CD5 negative CD10 negative CD103 negative clonal B-cell population nonspecific phenotype and 18% of leukocytes.  Interval history-patient continues to have generalized itching and he sees dermatology for the same.  He is currently on steroids as well.   ECOG PS- 2 Pain scale- 0 Opioid associated constipation- no  Review of systems- Review of Systems  Constitutional: Positive for malaise/fatigue. Negative for chills, fever and weight loss.  HENT: Negative for congestion, ear discharge and nosebleeds.   Eyes: Negative for blurred vision.  Respiratory: Negative for cough, hemoptysis, sputum production, shortness of breath and wheezing.   Cardiovascular: Negative for chest pain, palpitations, orthopnea and claudication.  Gastrointestinal: Negative for abdominal pain, blood in stool, constipation, diarrhea, heartburn, melena, nausea and vomiting.  Genitourinary: Negative for dysuria, flank pain, frequency, hematuria and urgency.  Musculoskeletal: Negative for back pain, joint pain and myalgias.  Skin: Positive for rash.  Neurological:  Negative for dizziness, tingling, focal weakness, seizures, weakness and headaches.  Endo/Heme/Allergies: Does not bruise/bleed easily.    Psychiatric/Behavioral: Negative for depression and suicidal ideas. The patient does not have insomnia.       No Known Allergies   Past Medical History:  Diagnosis Date  . GERD (gastroesophageal reflux disease)   . GI bleed   . HOH (hard of hearing)      Past Surgical History:  Procedure Laterality Date  . APPENDECTOMY    . bilat. rotator cuff issues Bilateral    "stiff" shoulders  . CORONARY STENT INTERVENTION N/A 04/21/2017   Procedure: CORONARY STENT INTERVENTION;  Surgeon: Isaias Cowman, MD;  Location: [place] [facility];  Service: Cardiovascular;  Laterality: N/A;  . IR GENERIC HISTORICAL  05/12/2016   IR EMBO ART  VEN HEMORR LYMPH EXTRAV  INC GUIDE ROADMAPPING 05/12/2016 Corrie Mckusick, DO [facility]  . IR GENERIC HISTORICAL  05/12/2016   IR ANGIOGRAM VISCERAL SELECTIVE 05/12/2016 Corrie Mckusick, DO [facility]  . IR GENERIC HISTORICAL  05/12/2016   IR US GUIDE VASC ACCESS RIGHT 05/12/2016 Corrie Mckusick, DO [facility]  . IR GENERIC HISTORICAL  05/12/2016   IR ANGIOGRAM SELECTIVE EACH ADDITIONAL VESSEL 05/12/2016 Corrie Mckusick, DO [facility]  . IR GENERIC HISTORICAL  05/12/2016   IR ANGIOGRAM SELECTIVE EACH ADDITIONAL VESSEL 05/12/2016 Corrie Mckusick, DO [facility]  . IR GENERIC HISTORICAL  05/12/2016   IR ANGIOGRAM FOLLOW UP STUDY 05/12/2016 Corrie Mckusick, DO [facility]  . IR GENERIC HISTORICAL  05/12/2016   IR ANGIOGRAM SELECTIVE EACH ADDITIONAL VESSEL 05/12/2016 Corrie Mckusick, DO [facility]  . LEFT HEART CATH AND CORONARY ANGIOGRAPHY Left 04/21/2017   Procedure: LEFT HEART CATH AND CORONARY ANGIOGRAPHY;  Surgeon: Isaias Cowman, MD;  Location: [place] [facility];  Service: Cardiovascular;  Laterality: Left;    Social History   Socioeconomic History  . Marital status: Widowed    Spouse name: Not on file  . Number of children: Not on file  . Years of education: Not on file  . Highest education level: Not on file  Occupational History   . Not on file  Social Needs  . Financial resource strain: Not on file  . Food insecurity:    Worry: Not on file    Inability: Not on file  . Transportation needs:    Medical: Not on file    Non-medical: Not on file  Tobacco Use  . Smoking status: Former Smoker    Packs/day: 0.50    Types: Cigarettes    Last attempt to quit: 1957    Years since quitting: 62.7  . Smokeless tobacco: Current User    Types: Snuff  Substance and Sexual Activity  . Alcohol use: Yes    Comment: every evening with supper  . Drug use: No  . Sexual activity: Never  Lifestyle  . Physical activity:    Days per week: Not on file    Minutes per session: Not on file  . Stress: Not on file  Relationships  . Social connections:    Talks on phone: Not on file    Gets together: Not on file    Attends religious service: Not on file    Active member of club or organization: Not on file    Attends meetings of clubs or organizations: Not on file    Relationship status: Not on file  . Intimate partner violence:    Fear of current or ex partner: Not on file  Emotionally abused: Not on file    Physically abused: Not on file    Forced sexual activity: Not on file  Other Topics Concern  . Not on file  Social History Narrative  . Not on file    Family History  Problem Relation Age of Onset  . Diabetes Mother   . Stroke Father   . Ovarian cancer Sister      Current Outpatient Medications:  .  alum & mag hydroxide-simeth (MAALOX/MYLANTA) 200-200-20 MG/5ML suspension, Take 15 mLs by mouth every 4 (four) hours as needed for indigestion or heartburn., Disp: 355 mL, Rfl: 0 .  amLODipine (NORVASC) 5 MG tablet, Take 1 tablet (5 mg total) by mouth daily. (Patient not taking: Reported on 03/15/2018), Disp: 30 tablet, Rfl: 0 .  aspirin 81 MG chewable tablet, Chew 1 tablet (81 mg total) by mouth daily. (Patient not taking: Reported on 03/15/2018), Disp: 30 tablet, Rfl: 5 .  atorvastatin (LIPITOR) 10 MG tablet, Take  by mouth., Disp: , Rfl:  .  clopidogrel (PLAVIX) 75 MG tablet, Take 1 tablet (75 mg total) by mouth daily with breakfast., Disp: 30 tablet, Rfl: 5 .  feeding supplement, ENSURE ENLIVE, (ENSURE ENLIVE) LIQD, Take 237 mLs by mouth 2 (two) times daily between meals. (Patient not taking: Reported on 04/21/2017), Disp: 237 mL, Rfl: 12 .  isosorbide mononitrate (IMDUR) 30 MG 24 hr tablet, , Disp: , Rfl:  .  ivermectin (STROMECTOL) 3 MG TABS tablet, , Disp: , Rfl:  .  Maltodextrin-Xanthan Gum ([place]) POWD, As needed (Patient not taking: Reported on 04/21/2017), Disp: , Rfl:  .  metoprolol tartrate (LOPRESSOR) 25 MG tablet, Take 1 tablet (25 mg total) by mouth 2 (two) times daily. (Patient not taking: Reported on 03/15/2018), Disp: , Rfl:  .  nitroGLYCERIN (NITROSTAT) 0.4 MG SL tablet, Place 0.4 mg under the tongue every 5 (five) minutes as needed for chest pain., Disp: , Rfl:  .  pantoprazole (PROTONIX) 40 MG tablet, , Disp: , Rfl:  .  sucralfate (CARAFATE) 1 g tablet, Take by mouth., Disp: , Rfl:  .  thiamine 100 MG tablet, Take 1 tablet (100 mg total) by mouth daily. (Patient not taking: Reported on 03/15/2018), Disp: , Rfl:   Physical exam:  Vitals:   04/01/18 1428  BP: (!) 157/73  Pulse: 82  Resp: 18  Temp: 97.9 F (36.6 C)  TempSrc: Oral  SpO2: 98%  Weight: 113 lb (51.3 kg)  Height: 5' 6"  (1.676 m)   Physical Exam  Constitutional: He is oriented to person, place, and time.  Thin elderly frail gentleman who is in no acute distress  HENT:  Head: Normocephalic and atraumatic.  Eyes: Pupils are equal, round, and reactive to light. EOM are normal.  Neck: Normal range of motion.  Cardiovascular: Normal rate, regular rhythm and normal heart sounds.  Pulmonary/Chest: Effort normal and breath sounds normal.  Abdominal: Soft. Bowel sounds are normal.  Neurological: He is alert and oriented to person, place, and time.  Skin: Skin is warm and dry.  Generalized pruritic skin rash      CMP Latest Ref Rng & Units 03/15/2018  Glucose 70 - 99 mg/dL 103(H)  BUN 8 - 23 mg/dL 25(H)  Creatinine 0.61 - 1.24 mg/dL 1.18  Sodium 135 - 145 mmol/L 140  Potassium 3.5 - 5.1 mmol/L 4.1  Chloride 98 - 111 mmol/L 108  CO2 22 - 32 mmol/L 24  Calcium 8.9 - 10.3 mg/dL 9.0  Total Protein 6.5 -  8.1 g/dL 7.0  Total Bilirubin 0.3 - 1.2 mg/dL 0.7  Alkaline Phos 38 - 126 U/L 59  AST 15 - 41 U/L 25  ALT 0 - 44 U/L 15   CBC Latest Ref Rng & Units 03/15/2018  WBC 3.8 - 10.6 K/uL 5.7  Hemoglobin 13.0 - 18.0 g/dL 9.8(L)  Hematocrit 40.0 - 52.0 % 29.8(L)  Platelets 150 - 440 K/uL 115(L)      Assessment and plan- Patient is a 84 y.o. male referred for monocytosis and thrombocytopenia  On looking back at patient's CBC at least since 2017 patient has had chronic thrombocytopenia with platelet counts ranging between 60s to 80s.  Recent blood work revealed that his platelet count 115 which is actually an improvement for him.  His hemoglobin has been between 9-10 for the most part over the last 2 years.  Anemia work-up does reveal a component of iron deficiency.  I discussed trial of oral iron versus IV iron.  Patient would like to try oral iron at this time and I will see him back in 2 months time with repeat CBC ferritin and iron studies to see if there is an improvement in his iron stores and possibly his anemia.  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  However these findings have been present at least since 2017.  Patient is white count is normal along with a normal ANC.  His appetite is stable and he has not had any recent unintentional weight loss.  Given his age and frailty I would like to hold off on a bone marrow biopsy at this time.  His counts are otherwise stable and even if there is not evidence of low-grade B-cell lymphoproliferative disorder in his bone marrow, it would not change management at this time as I  would consider offering systemic treatment only if he has B symptoms or worsening cytopenias.  His serum tryptase level was normal and I do not think that we are dealing with any mast cell disorder that would explain his pruritus.  He will continue to follow-up with dermatology for this.   Visit Diagnosis 1. Iron deficiency anemia, unspecified iron deficiency anemia type   2. 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 Randa Evens, MD, MPH [facility] [phone] 04/04/2018 10:37 AM

## 2018-04-10 IMAGING — CT CT CTA ABD/PEL W/CM AND/OR W/O CM
2 of 13 series · 10 of 46 positions shown, 15 images · IV contrast (isovue)
Comparison: None.

CLINICAL DATA: Acute onset of syncope and bright red rectal
bleeding. Lightheadedness and generalized weakness. Initial
encounter.

EXAM:
CTA ABDOMEN AND PELVIS wITHOUT AND WITH CONTRAST
TECHNIQUE: Multidetector CT imaging of the abdomen and pelvis was performed
using the standard protocol during bolus administration of
intravenous contrast. Multiplanar reconstructed images and MIPs were
obtained and reviewed to evaluate the vascular anatomy.
CONTRAST:  75 mL of Isovue 370 IV contrast

[Series 6: axial venous · axial · portal-venous · 0.66mm/px · z∈[-993,-631]mm · 9 of 223 slices shown, 13 images]
[im 21/223  soft-tissue]
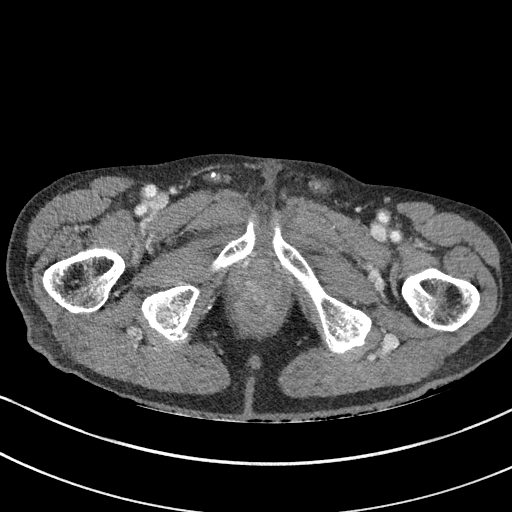
[im 21/223  bone]
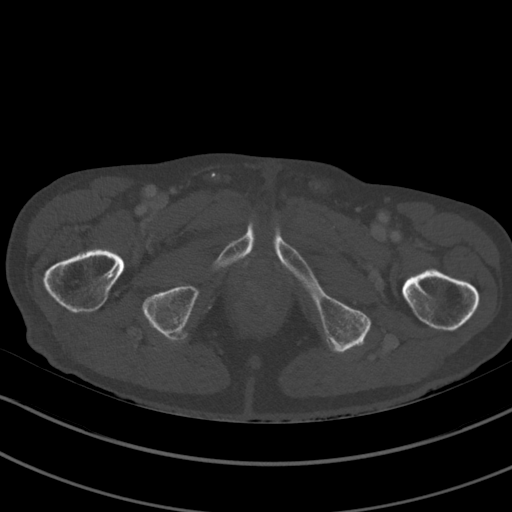
[im 41/223  soft-tissue]
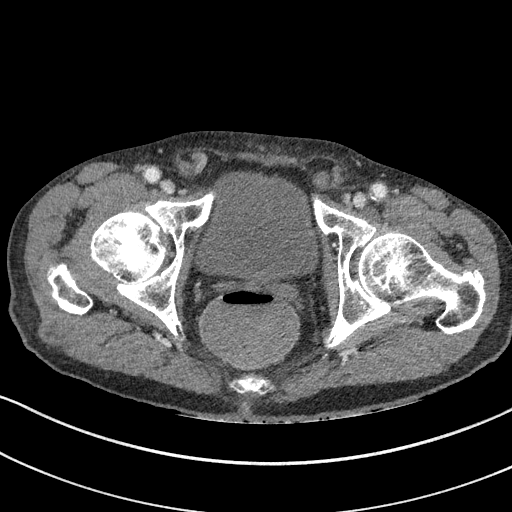
[im 81/223  soft-tissue]
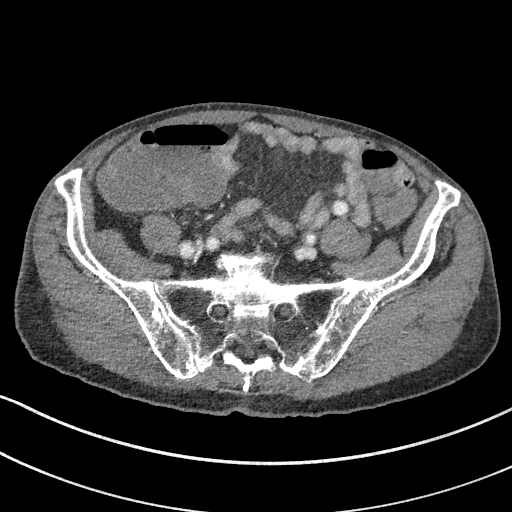
[im 101/223  soft-tissue]
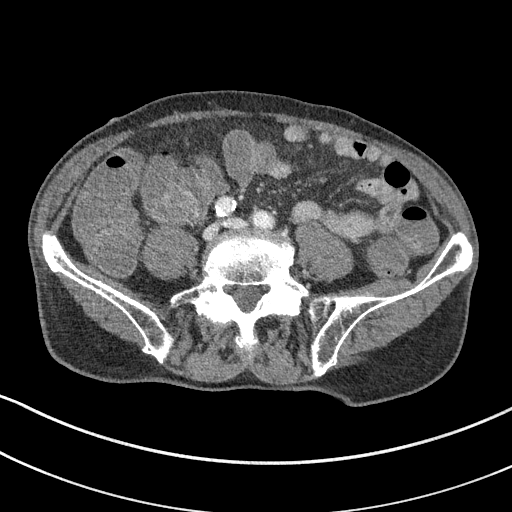
[im 122/223  soft-tissue]
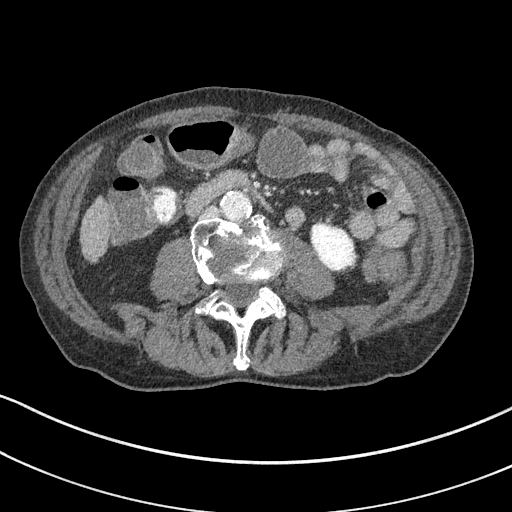
[im 142/223  soft-tissue]
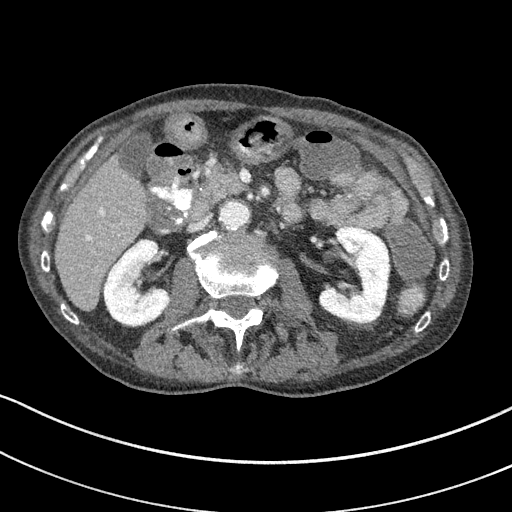
[im 142/223  lung]
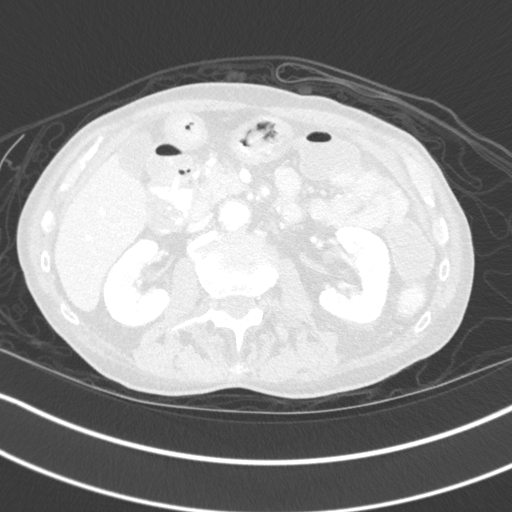
[im 162/223  lung]
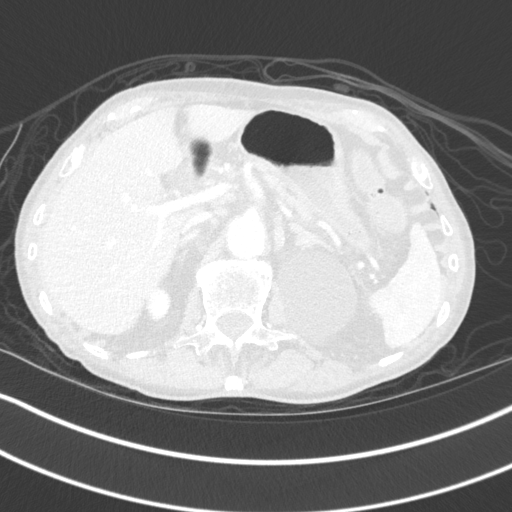
[im 182/223  soft-tissue]
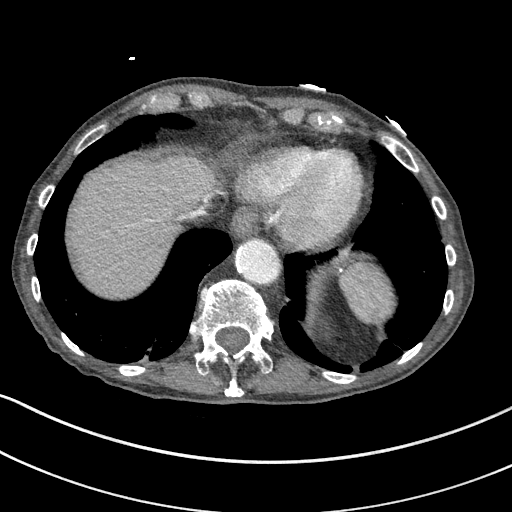
[im 182/223  lung]
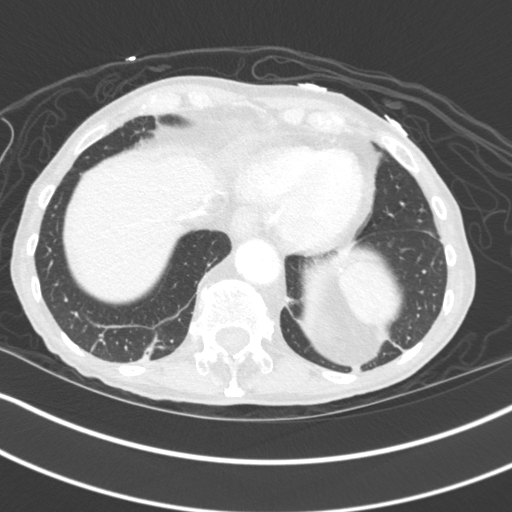
[im 202/223  soft-tissue]
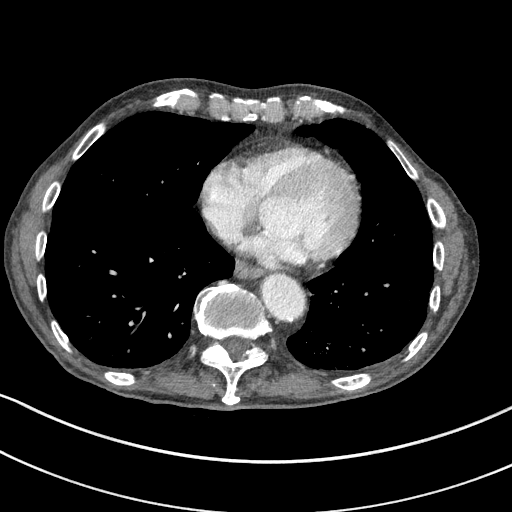
[im 202/223  lung]
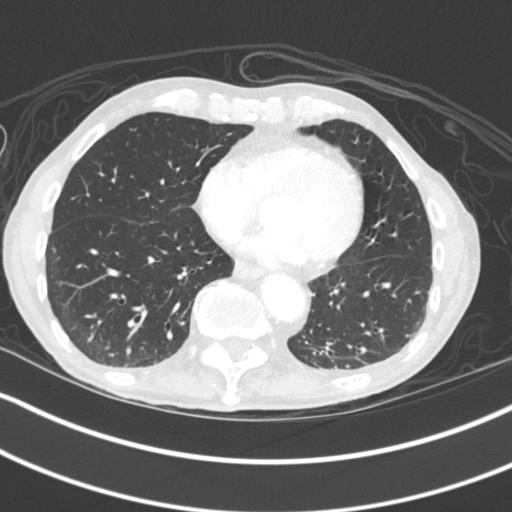

[Series 12: coronal mpr · coronal · 0.67mm/px · 1 of 104 slices shown, 2 images]
[im 52/104  soft-tissue]
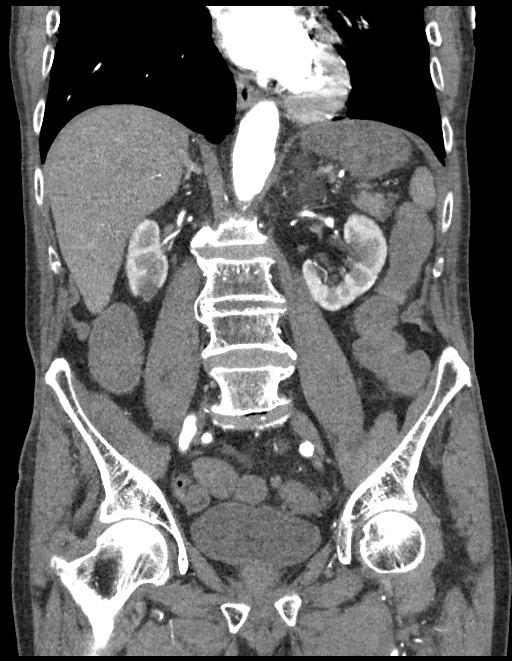
[im 52/104  bone]
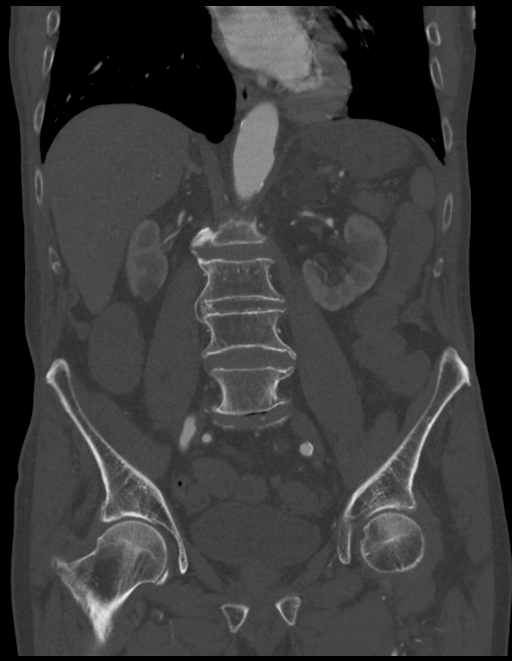

[10 of 46 positions shown; findings below may reference images not displayed]

FINDINGS: VASCULAR

Aorta: Scattered calcification is seen along the abdominal aorta,
without significant luminal narrowing.

Celiac: The celiac trunk demonstrates severe proximal luminal
narrowing, likely reflecting underlying mural thrombus.

SMA: The superior mesenteric artery appears patent, without evidence
of luminal narrowing.

Renals: Mild calcification is noted at the proximal renal arteries
bilaterally, without significant luminal narrowing.

IMA: The inferior mesenteric artery remains patent.

Inflow: Scattered calcification is seen along the common and
internal iliac arteries. The external iliac arteries and common
femoral arteries remain fully patent bilaterally, with mild
calcification at the right common femoral artery.

Proximal Outflow: Minimal mural thrombus is suggested at the
proximal left superficial femoral artery. Minimal mural thrombus is
suggested at the proximal right profunda femoris artery.

Veins: Visualized venous structures are grossly unremarkable. The
inferior vena cava is partially decompressed and unremarkable in
appearance.

Review of the MIP images confirms the above findings.

NON-VASCULAR

Lower chest: Mild bibasilar atelectasis or scarring is noted.
Diffuse coronary artery calcifications are seen. The visualized
portions of the mediastinum are otherwise unremarkable.

Hepatobiliary: The liver is unremarkable in appearance. The
gallbladder is unremarkable in appearance. The common bile duct
remains normal in caliber.

Pancreas: The pancreas is within normal limits.

Spleen: The spleen is unremarkable in appearance.

Adrenals/Urinary Tract: The adrenal glands are unremarkable in
appearance.

A large 7.5 cm cyst is noted at the upper pole of the left kidney.
Mild nonspecific perinephric stranding is noted bilaterally.
Additional scattered small bilateral renal cysts are seen. Mild
left-sided renal pelvicaliectasis remains within normal limits,
without evidence of significant hydronephrosis. No renal or ureteral
stones are identified.

Stomach/Bowel: The stomach is unremarkable in appearance. The small
bowel is within normal limits. The appendix is not visualized; there
is no evidence for appendicitis.

Scattered diverticulosis is noted along the proximal sigmoid colon,
without evidence of diverticulitis.

There is a large amount of acute extravasation of contrast at the
ascending colon, just proximal to the hepatic flexure of the colon,
compatible with active intraluminal hemorrhage. This explains the
patient's bright red blood per rectum.

Lymphatic: Retroperitoneal nodes are grossly unremarkable in
appearance. No pelvic sidewall lymphadenopathy is appreciated.

Reproductive: The bladder is mildly distended and grossly
unremarkable. The prostate remains normal in size.

Other: No additional soft tissue abnormalities are seen.

Musculoskeletal: No acute osseous abnormalities are identified.
Vacuum phenomenon is noted at L5-S1. Underlying facet disease is
noted. The visualized musculature is unremarkable in appearance.
IMPRESSION: VASCULAR

1. Large amount of acute extravasation of contrast at the ascending
colon, just proximal to the hepatic flexure of the colon, compatible
with active intraluminal hemorrhage. This explains the patient's
bright red blood per rectum.
2. Scattered aortic atherosclerosis noted. Severe proximal luminal
narrowing noted at the celiac trunk, likely reflecting underlying
mural thrombus.
3. Diffuse coronary artery calcifications seen.

NON-VASCULAR

1. Mild bibasilar atelectasis or scarring noted.
2. Scattered bilateral renal cysts, measuring up to 7.5 cm on the
left.
3. Scattered diverticulosis along the proximal sigmoid colon,
without evidence of diverticulitis.

These results were called by telephone at the time of interpretation
on 05/11/2016 at [DATE] to Dr. BLAIN JUMPER, who verbally
acknowledged these results.

## 2018-04-11 IMAGING — CR DG KNEE 1-2V PORT*L*
2 series · 2 of 2 positions shown · non-contrast
Comparison: No recent prior.

CLINICAL DATA: Recent fall.  Pain .

EXAM:
PORTABLE LEFT KNEE - 1-2 VIEW

[AP]
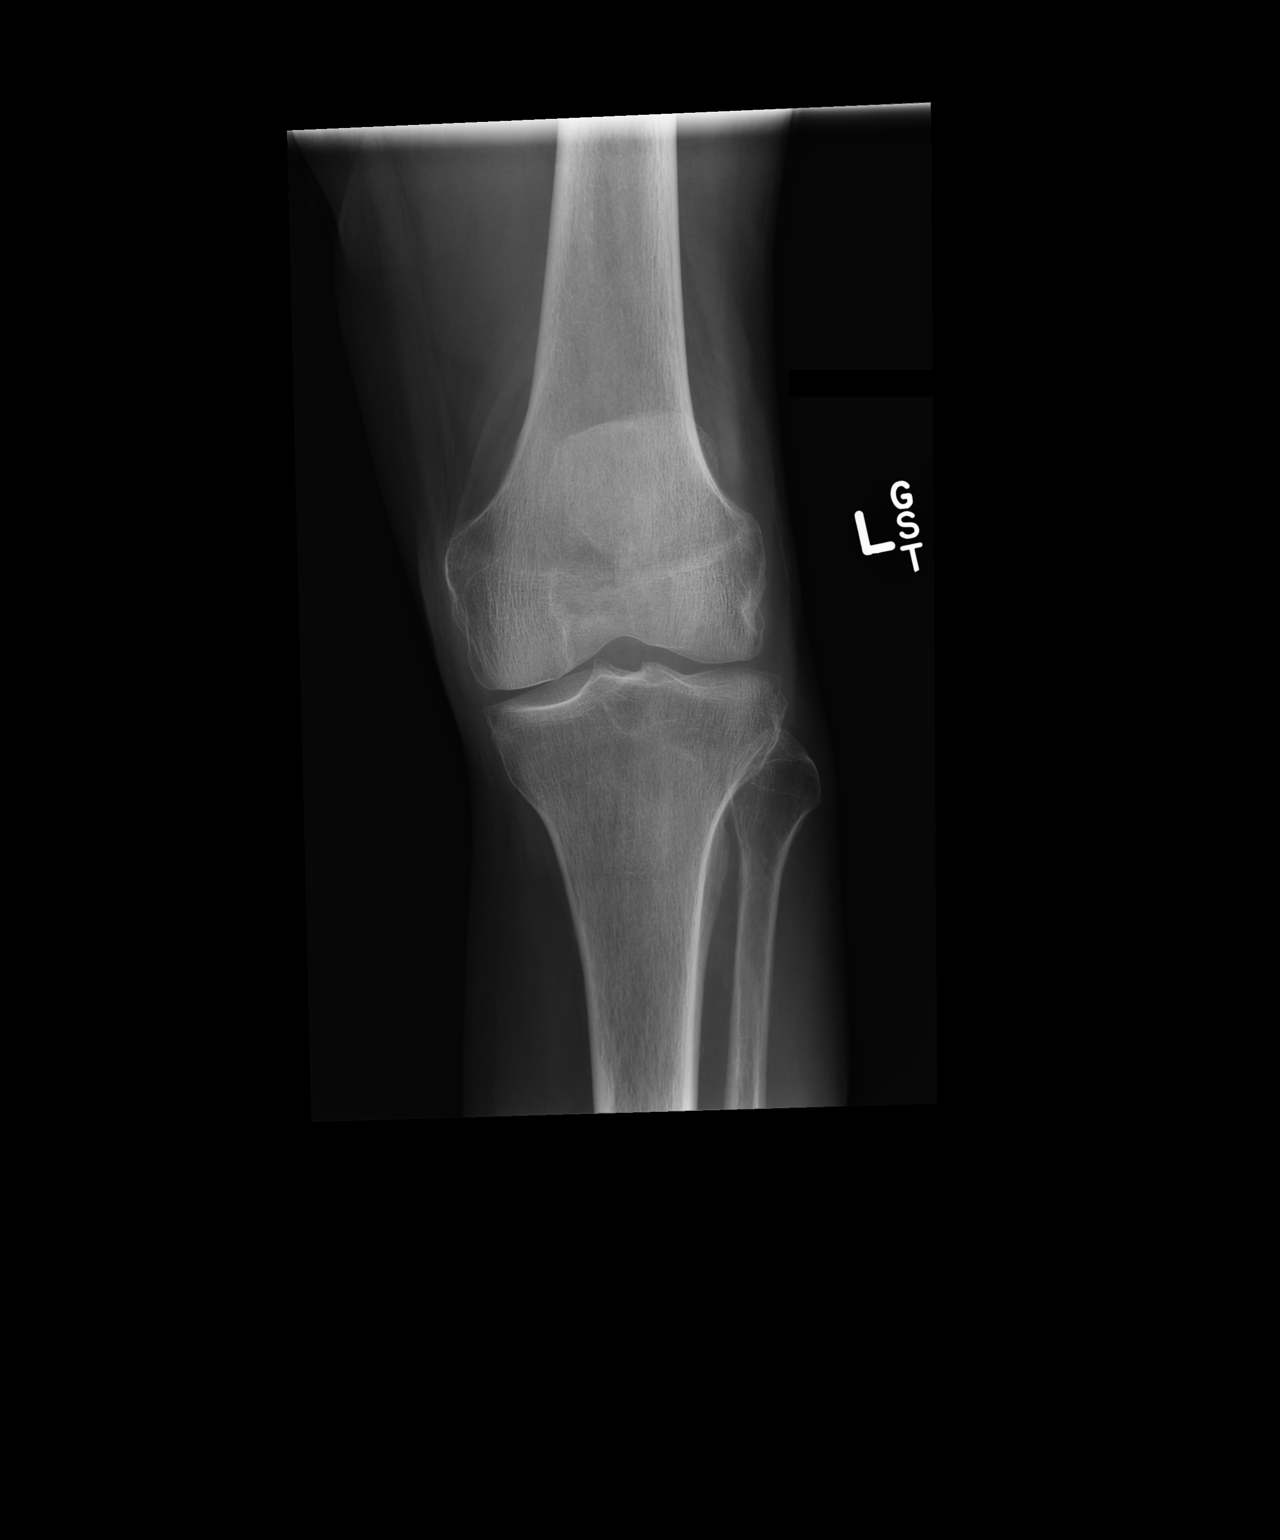

[xtable lateral]
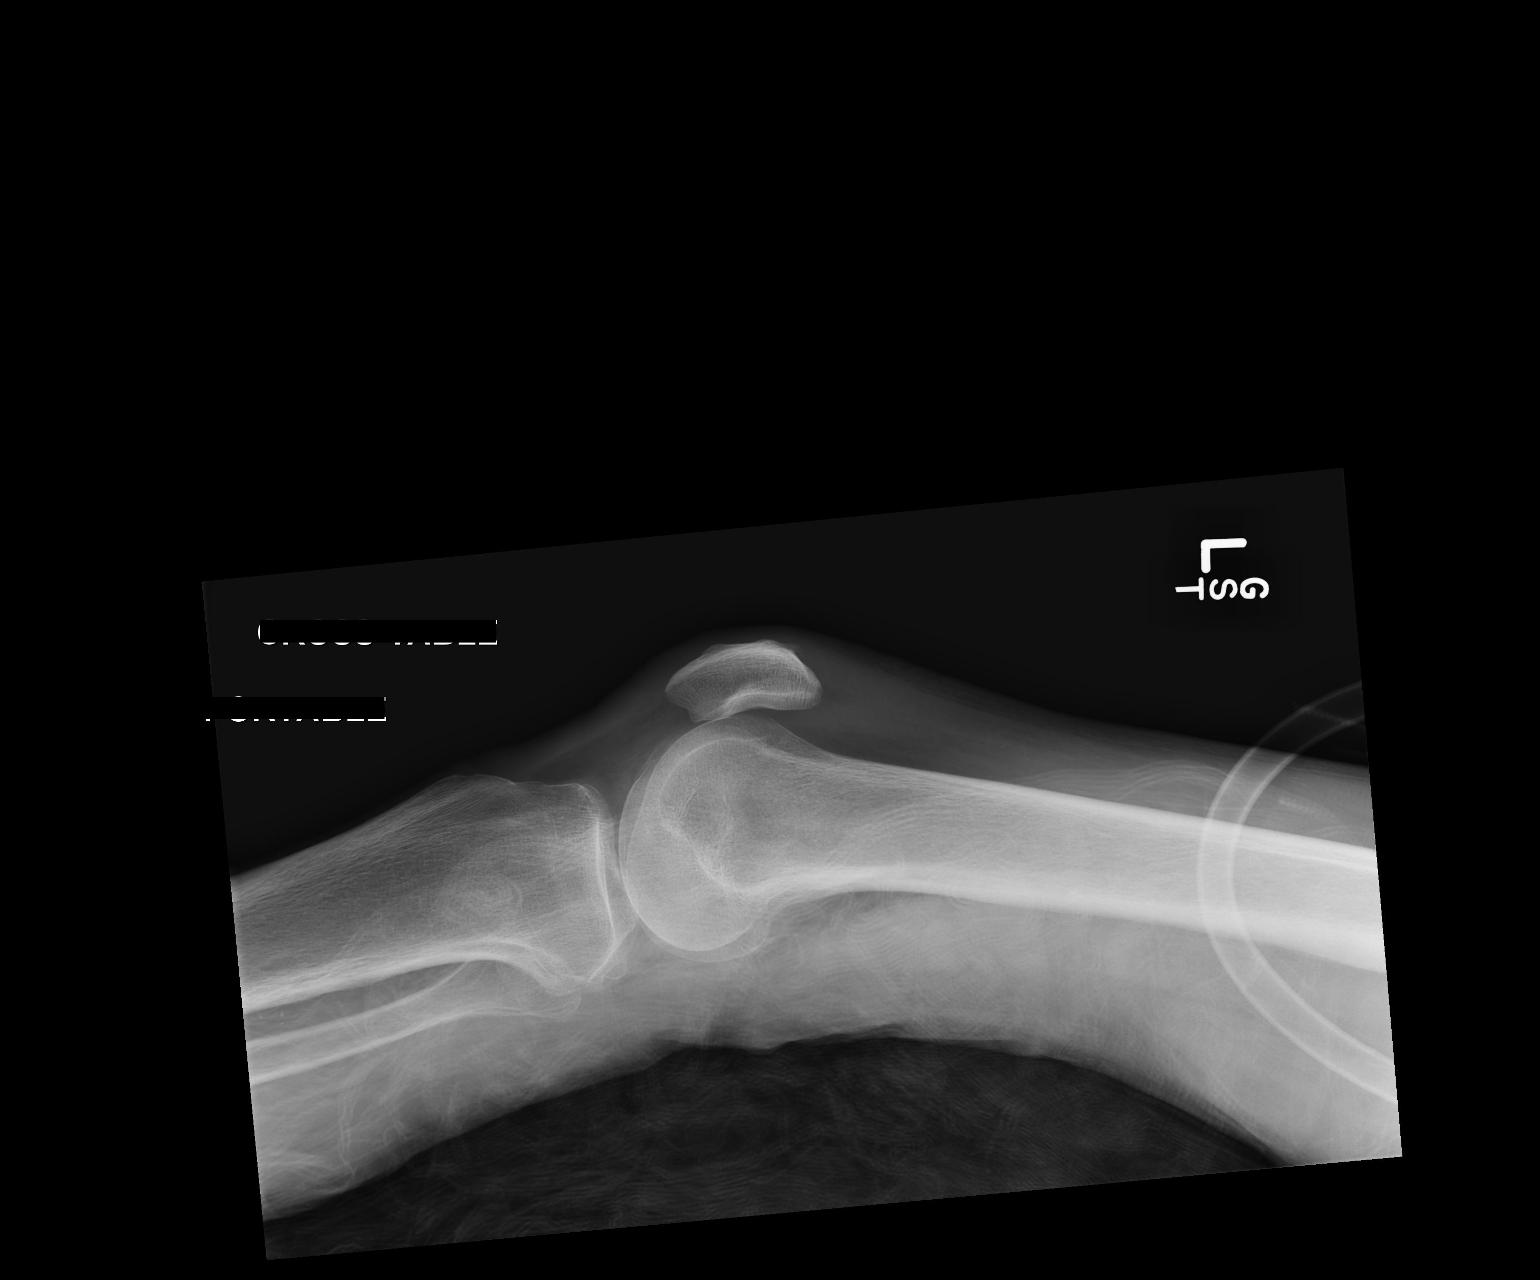

[2 of 2 positions shown; findings below may reference images not displayed]

FINDINGS: Mild tricompartment degenerative change. No acute bony or joint
abnormality identified. No evidence of fracture or dislocation.
IMPRESSION: Mild tricompartment degenerative change.  No acute abnormality

## 2018-04-12 IMAGING — CR DG ABD PORTABLE 1V
2 series · 2 of 2 positions shown · non-contrast
Comparison: Prior CT from 05/11/2016.

CLINICAL DATA: Initial evaluation for acute mid abdominal pain.

EXAM:
PORTABLE ABDOMEN - 1 VIEW

[AP (1 of 2)]
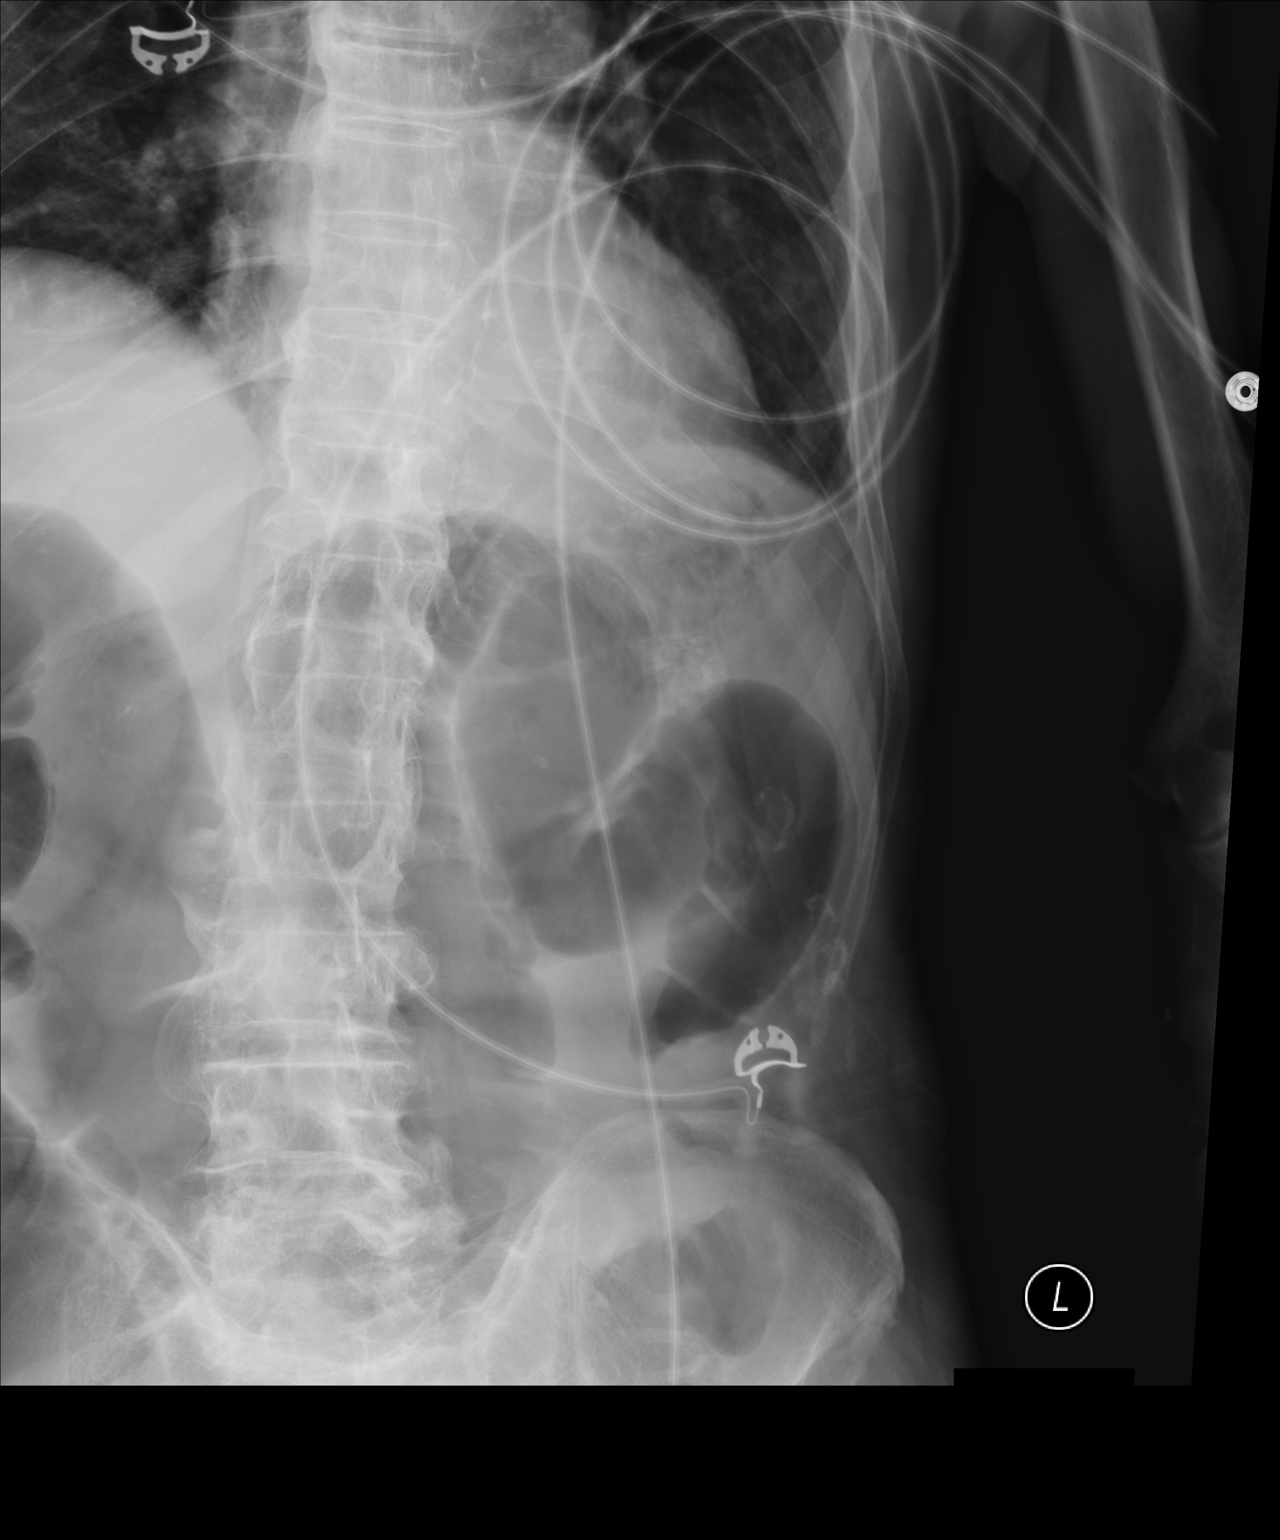

[AP (2 of 2)]
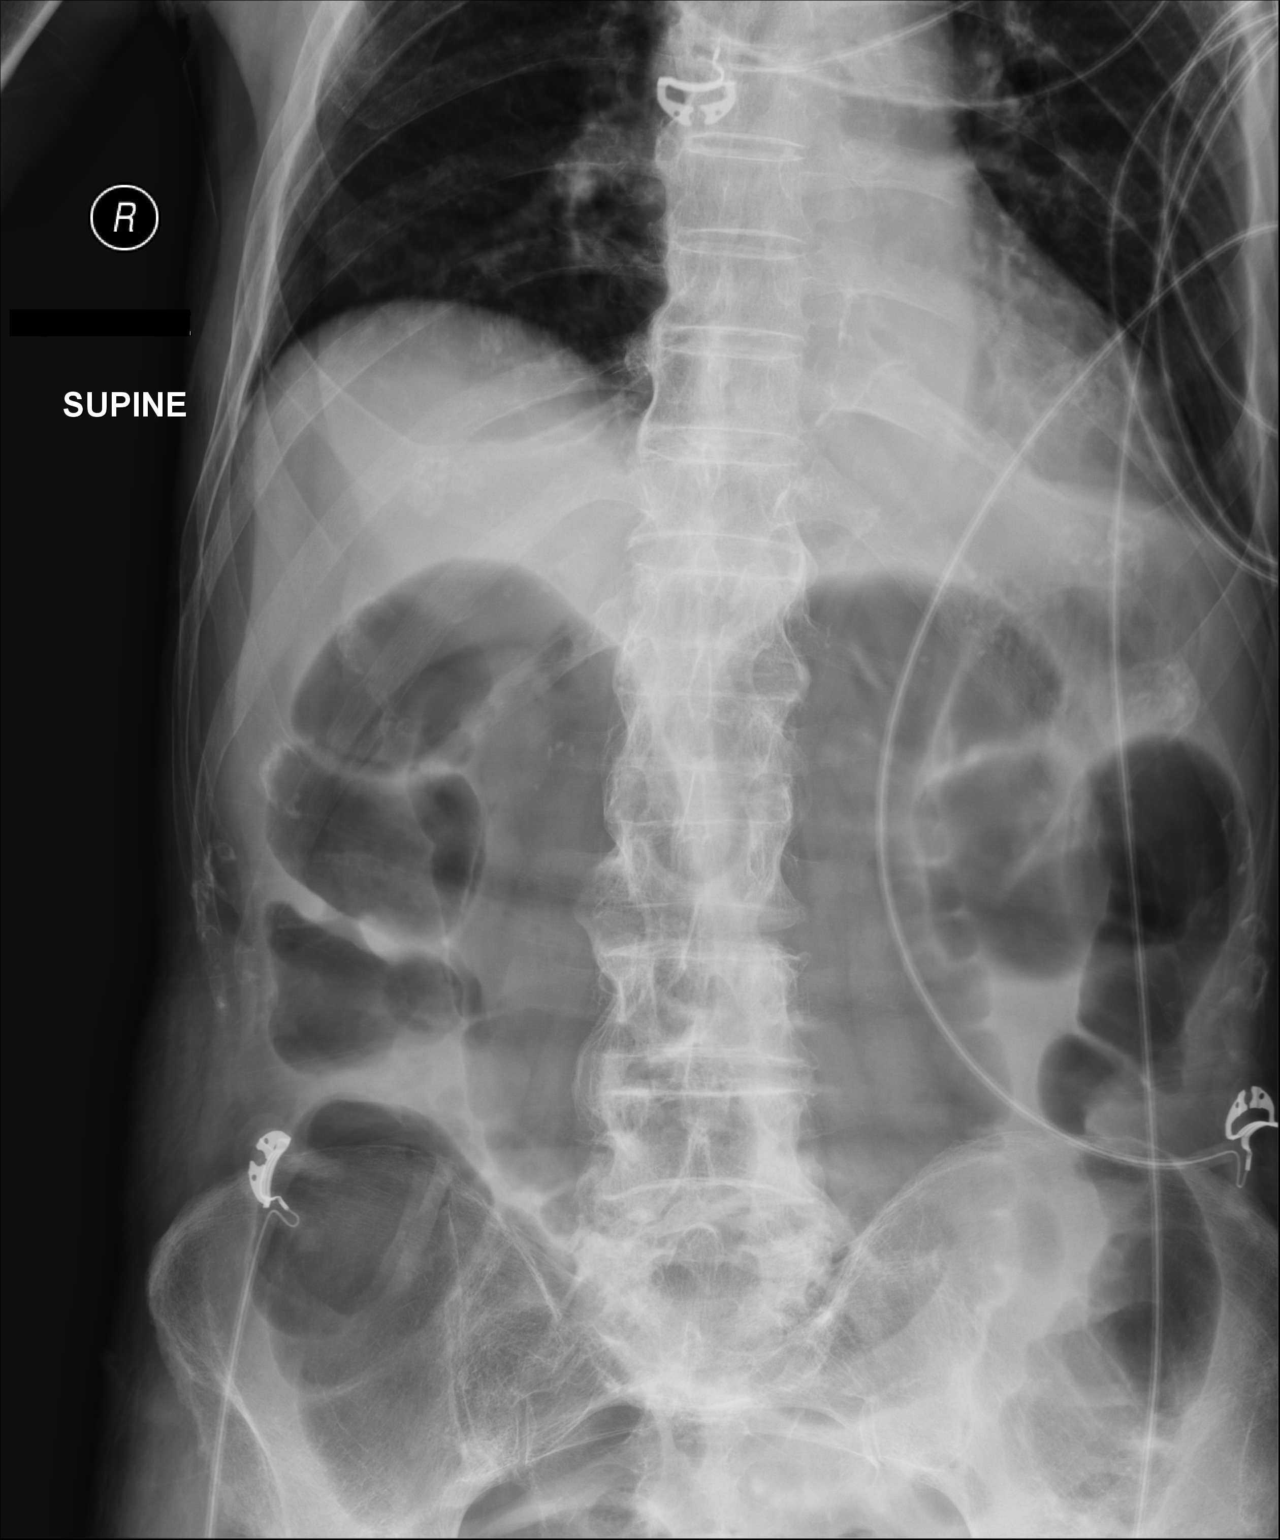

[2 of 2 positions shown; findings below may reference images not displayed]

FINDINGS: Multiple prominent gas-filled loops of bowel are seen throughout the
abdomen. These loops predominantly reflect the colon, although a few
prominent loops of small bowel present as well. No air-fluid levels.
No free air on these limited supine views. Imaging findings suggest
ileus, although developing distal obstructive process not excluded.

No soft tissue mass or abnormal calcification.

Visualized lung bases are largely clear.

No acute osseous abnormality.
IMPRESSION: Multiple prominent gas-filled loops of predominantly large bowel
within the abdomen. Findings are favored to reflect an ileus,
although a distal obstructive process is not entirely excluded.
Close interval follow-up recommended if there is high clinical
concern for an the evolving obstructive process.

## 2018-06-03 ENCOUNTER — Telehealth: Payer: Self-pay | Admitting: Oncology

## 2018-06-03 ENCOUNTER — Inpatient Hospital Stay: Payer: Medicare Other | Admitting: Oncology

## 2018-06-03 ENCOUNTER — Inpatient Hospital Stay: Payer: Medicare Other

## 2018-06-03 ENCOUNTER — Ambulatory Visit: Payer: Medicare Other | Admitting: Oncology

## 2018-06-03 ENCOUNTER — Other Ambulatory Visit: Payer: Medicare Other

## 2018-06-03 NOTE — Telephone Encounter (Signed)
Called patient to [facility] 06/03/18 No Show Lab + MD appt. per patient, he will not be rescheduling appts and have decided not to seek Medical Care. Message sent to Provider and Team via staff msg.

## 2020-06-20 ENCOUNTER — Observation Stay (HOSPITAL_COMMUNITY): Payer: Medicare Other

## 2020-06-20 ENCOUNTER — Other Ambulatory Visit: Payer: Self-pay

## 2020-06-20 ENCOUNTER — Inpatient Hospital Stay (HOSPITAL_COMMUNITY)
Admission: EM | Admit: 2020-06-20 | Discharge: 2020-06-23 | DRG: 378 | Disposition: A | Discharge status: Home / Self Care | Payer: Medicare Other | Attending: Internal Medicine | Admitting: Internal Medicine

## 2020-06-20 ENCOUNTER — Encounter (HOSPITAL_COMMUNITY): Payer: Self-pay

## 2020-06-20 DIAGNOSIS — K5521 Angiodysplasia of colon with hemorrhage: Secondary | ICD-10-CM | POA: Diagnosis not present

## 2020-06-20 DIAGNOSIS — E785 Hyperlipidemia, unspecified: Secondary | ICD-10-CM | POA: Diagnosis present

## 2020-06-20 DIAGNOSIS — D62 Acute posthemorrhagic anemia: Secondary | ICD-10-CM | POA: Diagnosis not present

## 2020-06-20 DIAGNOSIS — K922 Gastrointestinal hemorrhage, unspecified: Secondary | ICD-10-CM | POA: Diagnosis present

## 2020-06-20 DIAGNOSIS — D6959 Other secondary thrombocytopenia: Secondary | ICD-10-CM | POA: Diagnosis present

## 2020-06-20 DIAGNOSIS — K921 Melena: Secondary | ICD-10-CM | POA: Diagnosis not present

## 2020-06-20 DIAGNOSIS — Z20822 Contact with and (suspected) exposure to covid-19: Secondary | ICD-10-CM | POA: Diagnosis present

## 2020-06-20 DIAGNOSIS — I10 Essential (primary) hypertension: Secondary | ICD-10-CM | POA: Diagnosis present

## 2020-06-20 DIAGNOSIS — Z7982 Long term (current) use of aspirin: Secondary | ICD-10-CM

## 2020-06-20 DIAGNOSIS — Z87891 Personal history of nicotine dependence: Secondary | ICD-10-CM

## 2020-06-20 DIAGNOSIS — Z7902 Long term (current) use of antithrombotics/antiplatelets: Secondary | ICD-10-CM

## 2020-06-20 DIAGNOSIS — Z79899 Other long term (current) drug therapy: Secondary | ICD-10-CM

## 2020-06-20 DIAGNOSIS — E611 Iron deficiency: Secondary | ICD-10-CM | POA: Diagnosis present

## 2020-06-20 DIAGNOSIS — R933 Abnormal findings on diagnostic imaging of other parts of digestive tract: Secondary | ICD-10-CM

## 2020-06-20 DIAGNOSIS — K5731 Diverticulosis of large intestine without perforation or abscess with bleeding: Secondary | ICD-10-CM | POA: Diagnosis present

## 2020-06-20 DIAGNOSIS — E119 Type 2 diabetes mellitus without complications: Secondary | ICD-10-CM | POA: Diagnosis present

## 2020-06-20 DIAGNOSIS — K219 Gastro-esophageal reflux disease without esophagitis: Secondary | ICD-10-CM | POA: Diagnosis present

## 2020-06-20 DIAGNOSIS — I2510 Atherosclerotic heart disease of native coronary artery without angina pectoris: Secondary | ICD-10-CM | POA: Diagnosis present

## 2020-06-20 DIAGNOSIS — Z955 Presence of coronary angioplasty implant and graft: Secondary | ICD-10-CM

## 2020-06-20 HISTORY — PX: IR ANGIOGRAM SELECTIVE EACH ADDITIONAL VESSEL: IMG667

## 2020-06-20 HISTORY — PX: IR US GUIDE VASC ACCESS RIGHT: IMG2390

## 2020-06-20 HISTORY — PX: IR ANGIOGRAM VISCERAL SELECTIVE: IMG657

## 2020-06-20 LAB — CBC WITH DIFFERENTIAL/PLATELET
Abs Immature Granulocytes: 0.05 10*3/uL (ref 0.00–0.07)
Basophils Absolute: 0 10*3/uL (ref 0.0–0.1)
Basophils Relative: 0 %
Eosinophils Absolute: 0 10*3/uL (ref 0.0–0.5)
Eosinophils Relative: 0 %
HCT: 25.3 % — ABNORMAL LOW (ref 39.0–52.0)
Hemoglobin: 8.2 g/dL — ABNORMAL LOW (ref 13.0–17.0)
Immature Granulocytes: 1 %
Lymphocytes Relative: 36 %
Lymphs Abs: 2.2 10*3/uL (ref 0.7–4.0)
MCH: 31.3 pg (ref 26.0–34.0)
MCHC: 32.4 g/dL (ref 30.0–36.0)
MCV: 96.6 fL (ref 80.0–100.0)
Monocytes Absolute: 1.7 10*3/uL — ABNORMAL HIGH (ref 0.1–1.0)
Monocytes Relative: 28 %
Neutro Abs: 2.1 10*3/uL (ref 1.7–7.7)
Neutrophils Relative %: 35 %
Platelets: 104 10*3/uL — ABNORMAL LOW (ref 150–400)
RBC: 2.62 MIL/uL — ABNORMAL LOW (ref 4.22–5.81)
RDW: 15.9 % — ABNORMAL HIGH (ref 11.5–15.5)
WBC: 6 10*3/uL (ref 4.0–10.5)
nRBC: 0 % (ref 0.0–0.2)

## 2020-06-20 LAB — COMPREHENSIVE METABOLIC PANEL
ALT: 17 U/L (ref 0–44)
ALT: 15 U/L (ref 0–44)
AST: 31 U/L (ref 15–41)
AST: 26 U/L (ref 15–41)
Albumin: 4 g/dL (ref 3.5–5.0)
Albumin: 3.4 g/dL — ABNORMAL LOW (ref 3.5–5.0)
Alkaline Phosphatase: 75 U/L (ref 38–126)
Alkaline Phosphatase: 60 U/L (ref 38–126)
Anion gap: 10 (ref 5–15)
Anion gap: 8 (ref 5–15)
BUN: 25 mg/dL — ABNORMAL HIGH (ref 8–23)
BUN: 18 mg/dL (ref 8–23)
CO2: 24 mmol/L (ref 22–32)
CO2: 22 mmol/L (ref 22–32)
Calcium: 9.1 mg/dL (ref 8.9–10.3)
Calcium: 7.9 mg/dL — ABNORMAL LOW (ref 8.9–10.3)
Chloride: 105 mmol/L (ref 98–111)
Chloride: 106 mmol/L (ref 98–111)
Creatinine, Ser: 1.1 mg/dL (ref 0.61–1.24)
Creatinine, Ser: 0.9 mg/dL (ref 0.61–1.24)
GFR, Estimated: >60 mL/min (ref >60)
GFR, Estimated: >60 mL/min (ref >60)
Glucose, Bld: 114 mg/dL — ABNORMAL HIGH (ref 70–99)
Glucose, Bld: 86 mg/dL (ref 70–99)
Potassium: 4.5 mmol/L (ref 3.5–5.1)
Potassium: 4.1 mmol/L (ref 3.5–5.1)
Sodium: 139 mmol/L (ref 135–145)
Sodium: 136 mmol/L (ref 135–145)
Total Bilirubin: 0.6 mg/dL (ref 0.3–1.2)
Total Bilirubin: 0.4 mg/dL (ref 0.3–1.2)
Total Protein: 6.7 g/dL (ref 6.5–8.1)
Total Protein: 5.6 g/dL — ABNORMAL LOW (ref 6.5–8.1)

## 2020-06-20 LAB — CBC
HCT: 31.2 % — ABNORMAL LOW (ref 39.0–52.0)
Hemoglobin: 9.6 g/dL — ABNORMAL LOW (ref 13.0–17.0)
MCH: 29.7 pg (ref 26.0–34.0)
MCHC: 30.8 g/dL (ref 30.0–36.0)
MCV: 96.6 fL (ref 80.0–100.0)
Platelets: 125 10*3/uL — ABNORMAL LOW (ref 150–400)
RBC: 3.23 MIL/uL — ABNORMAL LOW (ref 4.22–5.81)
RDW: 15.9 % — ABNORMAL HIGH (ref 11.5–15.5)
WBC: 8.1 10*3/uL (ref 4.0–10.5)
nRBC: 0 % (ref 0.0–0.2)

## 2020-06-20 LAB — RETICULOCYTES
Immature Retic Fract: 30.5 % — ABNORMAL HIGH (ref 2.3–15.9)
RBC.: 2.66 MIL/uL — ABNORMAL LOW (ref 4.22–5.81)
Retic Count, Absolute: 42.8 10*3/uL (ref 19.0–186.0)
Retic Ct Pct: 1.6 % (ref 0.4–3.1)

## 2020-06-20 LAB — PHOSPHORUS: Phosphorus: 2.3 mg/dL — ABNORMAL LOW (ref 2.5–4.6)

## 2020-06-20 LAB — FERRITIN: Ferritin: 31 ng/mL (ref 24–336)

## 2020-06-20 LAB — RESP PANEL BY RT-PCR (FLU A&B, COVID) ARPGX2
Influenza A by PCR: NEGATIVE
Influenza B by PCR: NEGATIVE
SARS Coronavirus 2 by RT PCR: NEGATIVE

## 2020-06-20 LAB — POC OCCULT BLOOD, ED: Fecal Occult Bld: POSITIVE — AB

## 2020-06-20 LAB — MAGNESIUM: Magnesium: 1.9 mg/dL (ref 1.7–2.4)

## 2020-06-20 MED ORDER — NITROGLYCERIN 0.4 MG SL SUBL: 0.4000 mg | SUBLINGUAL_TABLET | SUBLINGUAL | Status: DC | PRN

## 2020-06-20 MED ORDER — MIDAZOLAM HCL 2 MG/2ML IJ SOLN
INTRAMUSCULAR | Status: AC | PRN
  Administered 2020-06-20 (×3): 0.5 mg via INTRAVENOUS

## 2020-06-20 MED ORDER — FENTANYL CITRATE (PF) 100 MCG/2ML IJ SOLN
INTRAMUSCULAR | Status: AC | PRN
Start: 2020-06-20 — End: 2020-06-20
  Administered 2020-06-20 (×2): 25 ug via INTRAVENOUS

## 2020-06-20 MED ORDER — IOHEXOL 350 MG/ML SOLN
100.0000 mL | Freq: Once | INTRAVENOUS | Status: AC | PRN
  Administered 2020-06-20: 100 mL via INTRAVENOUS

## 2020-06-20 MED ORDER — FENTANYL CITRATE (PF) 100 MCG/2ML IJ SOLN
INTRAMUSCULAR | Status: AC
  Filled 2020-06-20: qty 2

## 2020-06-20 MED ORDER — SODIUM CHLORIDE 0.9 % IV SOLN: INTRAVENOUS | Status: DC

## 2020-06-20 MED ORDER — IOHEXOL 300 MG/ML  SOLN
150.0000 mL | Freq: Once | INTRAMUSCULAR | Status: AC | PRN
  Administered 2020-06-20: 55 mL via INTRA_ARTERIAL

## 2020-06-20 MED ORDER — LIDOCAINE HCL 1 % IJ SOLN
INTRAMUSCULAR | Status: AC
  Filled 2020-06-20: qty 20

## 2020-06-20 MED ORDER — MIDAZOLAM HCL 2 MG/2ML IJ SOLN
INTRAMUSCULAR | Status: AC
  Filled 2020-06-20: qty 2

## 2020-06-20 MED ORDER — GELATIN ABSORBABLE 12-7 MM EX MISC
CUTANEOUS | Status: AC
  Filled 2020-06-20: qty 1

## 2020-06-20 MED ORDER — LIDOCAINE HCL 1 % IJ SOLN
INTRAMUSCULAR | Status: AC | PRN
  Administered 2020-06-20: 5 mL

## 2020-06-20 MED ORDER — SODIUM CHLORIDE 0.9 % IV BOLUS
1000.0000 mL | Freq: Once | INTRAVENOUS | Status: AC
  Administered 2020-06-20: 1000 mL via INTRAVENOUS

## 2020-06-20 MED ORDER — VITAMIN D 25 MCG (1000 UNIT) PO TABS
1000.0000 [IU] | ORAL_TABLET | Freq: Every day | ORAL | Status: DC
  Filled 2020-06-20: qty 1

## 2020-06-20 MED ORDER — ACETAMINOPHEN 325 MG PO TABS: 650.0000 mg | ORAL_TABLET | Freq: Four times a day (QID) | ORAL | Status: DC | PRN

## 2020-06-20 MED ORDER — IOHEXOL 300 MG/ML  SOLN
150.0000 mL | Freq: Once | INTRAMUSCULAR | Status: AC | PRN
  Administered 2020-06-20: 20 mL via INTRA_ARTERIAL

## 2020-06-20 MED ORDER — ACETAMINOPHEN 650 MG RE SUPP: 650.0000 mg | Freq: Four times a day (QID) | RECTAL | Status: DC | PRN

## 2020-06-20 MED ORDER — ATORVASTATIN CALCIUM 10 MG PO TABS
10.0000 mg | ORAL_TABLET | Freq: Every day | ORAL | Status: DC
  Administered 2020-06-20 – 2020-06-23 (×4): 10 mg via ORAL
  Filled 2020-06-20 (×4): qty 1

## 2020-06-20 NOTE — H&P (Addendum)
Date: 06/20/2020               Patient Name:  Dustin Howell MRN: [id number]  DOB: 01-10-1925 Age / Sex: 86 y.o., male   PCP: [name], MD         Medical Service: Internal Medicine Teaching Service         Attending Physician: Dr. Fredia Sorrow, MD    First Contact: Iona Beard, MD Pager: [phone]  Second Contact: Blenda Nicely Pager: [facility] [phone])       After Hours (After 5p/  First Contact Pager: [phone]  weekends / holidays): Second Contact Pager: [phone]   Chief Complaint: Lower GI bleed  History of Present Illness: Dustin Howell is a 86 y.o. male with a pertinent past medical history of GI bleed (4 years ago requiring ablation), CAD (s/p DES to OM1), presented to [facility] with a chief complaint of lower GI bleed.    Patient states that he had melanotic stools roughly 2 days ago. States this started after dinner. Has noticed 5-6 darkened BM that have been soft. Did not notice any frank blood during this time. Came to ED today and had a bloody bowel movement while in ED. States he had a similar episode 4 years ago requiring embolization. Was on Plavix at that time but was stopped then. Denies fever, abdominal pain, nausea, vomiting, constipation,  dizziness, syncope, changes in appetite. He takes over the counter pm medications nightly for sleep unsure if they contain NSAIDS. Denies other NSAID use. States he has not had a colonoscopy in the past .  ED Course: Afebrile, HDS, Hgb of 9.6, BUN 25, COVID negative, FOBT positive.   Lab Orders     Resp Panel by RT-PCR (Flu A&B, Covid) Nasopharyngeal Swab     CBC     Comprehensive metabolic panel     POC occult blood, ED Provider will collect   Meds:  Current Meds  Medication Sig  . amLODipine (NORVASC) 5 MG tablet Take 1 tablet (5 mg total) by mouth daily.  Marland Kitchen aspirin 81 MG chewable tablet Chew 1 tablet (81 mg total) by mouth daily.  Marland Kitchen atorvastatin (LIPITOR) 10 MG tablet Take 10 mg by mouth daily.  .  nitroGLYCERIN (NITROSTAT) 0.4 MG SL tablet Place 0.4 mg under the tongue every 5 (five) minutes as needed for chest pain.    Social: Lives by himself in [place]. Daughter lives nearby and helps out when needed. Drinks small amount of whiskey daily with dinner. Remote history of smoking. Denies illicit drug use.  Family History: Denies history of GI cancer  Allergies: Allergies as of 06/20/2020  . (No Known Allergies)   Past Medical History:  Diagnosis Date  . GERD (gastroesophageal reflux disease)   . GI bleed   . HOH (hard of hearing)     Review of Systems: A complete ROS was negative except as per HPI.   Physical Exam: Blood pressure (!) 155/75, pulse 83, temperature 98.8 F (37.1 C), temperature source Oral, resp. rate 16, height 5\' 7"  (1.702 m), weight 47.6 kg, SpO2 99 %. Physical Exam Constitutional:      Appearance: Normal appearance.     Comments: Thin appearing  HENT:     Head: Normocephalic and atraumatic.     Right Ear: External ear normal.     Left Ear: External ear normal.     Nose: Nose normal.     Mouth/Throat:     Mouth: Mucous membranes are moist.  Pharynx: Oropharynx is clear.  Eyes:     Extraocular Movements: Extraocular movements intact.     Conjunctiva/sclera: Conjunctivae normal.     Pupils: Pupils are equal, round, and reactive to light.  Cardiovascular:     Rate and Rhythm: Normal rate and regular rhythm.     Pulses: Normal pulses.     Heart sounds: Normal heart sounds. No murmur heard.   Abdominal:     General: Abdomen is flat. Bowel sounds are normal. There is no distension.     Palpations: Abdomen is soft. There is no mass.     Tenderness: There is no abdominal tenderness.  Musculoskeletal:     Cervical back: Normal range of motion and neck supple.  Skin:    General: Skin is warm and dry.     Capillary Refill: Capillary refill takes 2 to 3 seconds.     Coloration: Skin is pale.  Neurological:     General: No focal deficit present.      Mental Status: He is alert and oriented to person, place, and time. Mental status is at baseline.  Psychiatric:        Mood and Affect: Mood normal.        Behavior: Behavior normal.     Labs: CBC    Component Value Date/Time   WBC 8.1 06/20/2020 1020   RBC 3.23 (L) 06/20/2020 1020   HGB 9.6 (L) 06/20/2020 1020   HCT 31.2 (L) 06/20/2020 1020   PLT 125 (L) 06/20/2020 1020   MCV 96.6 06/20/2020 1020   MCH 29.7 06/20/2020 1020   MCHC 30.8 06/20/2020 1020   RDW 15.9 (H) 06/20/2020 1020   LYMPHSABS 2.6 03/15/2018 1419   MONOABS 0.6 03/15/2018 1419   EOSABS 0.2 03/15/2018 1419   BASOSABS 0.0 03/15/2018 1419     CMP     Component Value Date/Time   NA 139 06/20/2020 1020   K 4.5 06/20/2020 1020   CL 105 06/20/2020 1020   CO2 24 06/20/2020 1020   GLUCOSE 114 (H) 06/20/2020 1020   BUN 25 (H) 06/20/2020 1020   CREATININE 1.10 06/20/2020 1020   CALCIUM 9.1 06/20/2020 1020   PROT 6.7 06/20/2020 1020   ALBUMIN 4.0 06/20/2020 1020   AST 31 06/20/2020 1020   ALT 17 06/20/2020 1020   ALKPHOS 75 06/20/2020 1020   BILITOT 0.6 06/20/2020 1020   GFRNONAA >60 06/20/2020 1020   GFRAA >60 03/15/2018 1419    Imaging: No results found.  EKG: personally reviewed my interpretation is HR 83, sinus rhythm, prolonged QT   Assessment & Plan by Problem: Active Problems:   * No active hospital problems. *  Dustin Howell is a 86 y.o. male with a pertinent past medical history of GI bleed (4 years ago requiring ablation), CAD (s/p DES to OM1), presented to [facility] with a chief complaint of lower GI bleed admitted for further medical management.  #Lower GI bleed: Patient with history of GI bleeding in 2017 requiring embolization. No colonoscopy done at that time due to frailty. Denies history of NSAID use and family history of GI. Hgb of 9.6, unclear baseline last hgb from 2 years ago of 9.8. Possible diverticular bleed vs AVMs. - Type and screen performed in ED - CTA abdomen pelvis  showing bleeding of rectosigmoid junction. - GI consulted - IR consulted for lower GI bleed - CBC pm transfuse if <7 - Reticulocyte count orders for iron supplementation   #CAD s/p DES in 2018 - No longer  on plavix due to GI 4 years ago.  - Hold aspirin  #Hypertension, hyperlipidemia - Continue atorvastatin, hold amlodipine in setting of GI bleed  Diet: NPO VTE: held due to GI bleed IVF: None,None Code: Full  Prior to Admission Living Arrangement: Home, living live alone Anticipated Discharge Location: Home Barriers to Discharge: Medical mangement  Dispo: Admit patient to Inpatient with expected length of stay greater than 2 midnights.  Signed: Marianna Payment, MD 06/20/2020, 2:19 PM  Pager: [phone]

## 2020-06-20 NOTE — ED Notes (Signed)
Patient returned from CT

## 2020-06-20 NOTE — Sedation Documentation (Signed)
Patient is resting comfortably. No complaints at this time 

## 2020-06-20 NOTE — ED Notes (Signed)
Got patient undressed on the monitor patient is resting with call bell in reach 

## 2020-06-20 NOTE — Procedures (Addendum)
  Procedure: Selective mesenteric arteriogram  Incomplete localization of distal sigmoid active arterial extravasation.  Recommend sigmoidoscopy for further evaluation and possible treatment. EBL:   minimal Complications:  none immediate  See full dictation in [employer].  Dillard Cannon MD Main # [phone] Pager  [phone] Mobile [phone]

## 2020-06-20 NOTE — ED Notes (Signed)
Pt continues to have BM with large amount dk red obvious blood

## 2020-06-20 NOTE — ED Notes (Signed)
Patient transported to CT via stretcher in stable condition 

## 2020-06-20 NOTE — Sedation Documentation (Signed)
RIGHT groin level 0, drsg CDI, RDP 1+, RPT 1+.

## 2020-06-20 NOTE — Sedation Documentation (Signed)
Vital signs stable. 

## 2020-06-20 NOTE — Progress Notes (Signed)
Report given to [employer] receiving pt.

## 2020-06-20 NOTE — Progress Notes (Signed)
Patient stable and transported to 3W10.

## 2020-06-20 NOTE — ED Triage Notes (Signed)
Pt BIB daughter due to bloody stools. Per daughter x4 years ago he had this same episode happen and he had major blood loss.Per daughter every 2-3 hours he has had 5-6 episodes of black tarry stools.OPt denies any pain.

## 2020-06-20 NOTE — ED Notes (Signed)
Pt with bright red bleeding in commode. Provider notified.

## 2020-06-20 NOTE — ED Provider Notes (Addendum)
[facility] Provider Note   CSN: [id number] Arrival date & time: 06/20/20  1004     History Chief Complaint  Patient presents with  . Blood In Stools    Dustin Howell is a 86 y.o. male.  Patient with about 2-day history of dark stools. Patient with significant GI blood loss 4 years ago. Having about 5-6 episodes of black tarry stools. But here in the emergency department actually had a grossly bloody stool. Patient denies any abdominal pain. Denies feeling like he is in a pass out. Patient has had his Covid vaccines. Patient not on any blood thinners but has been on Plavix in the past so may still be on that. In addition family states patient has not been eating and drinking very well        Past Medical History:  Diagnosis Date  . GERD (gastroesophageal reflux disease)   . GI bleed   . HOH (hard of hearing)     Patient Active Problem List   Diagnosis Date Noted  . CAD (coronary artery disease) 04/27/2017  . 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 Chest pain on exertion 04/21/2017  . Abnormal CT of the abdomen   . Acute encephalopathy   . Hypokalemia   . Ileus ([place])   . Abdominal pain   . Acute lower GI bleeding 05/11/2016  . Gastrointestinal hemorrhage 05/11/2016  . Acute blood loss anemia   . Arterial hypotension   . Acute pain of left knee   . Slurred speech     Past Surgical History:  Procedure Laterality Date  . APPENDECTOMY    . bilat. rotator cuff issues Bilateral    "stiff" shoulders  . CORONARY STENT INTERVENTION N/A 04/21/2017   Procedure: CORONARY STENT INTERVENTION;  Surgeon: Isaias Cowman, MD;  Location: [facility];  Service: Cardiovascular;  Laterality: N/A;  . IR GENERIC HISTORICAL  05/12/2016   IR EMBO ART  VEN HEMORR LYMPH EXTRAV  INC GUIDE ROADMAPPING 05/12/2016 Corrie Mckusick, DO [facility]  . IR GENERIC HISTORICAL  05/12/2016   IR ANGIOGRAM VISCERAL SELECTIVE 05/12/2016  Corrie Mckusick, DO [facility]  . IR GENERIC HISTORICAL  05/12/2016   IR US GUIDE VASC ACCESS RIGHT 05/12/2016 Corrie Mckusick, DO [facility]  . IR GENERIC HISTORICAL  05/12/2016   IR ANGIOGRAM SELECTIVE EACH ADDITIONAL VESSEL 05/12/2016 Corrie Mckusick, DO [facility]  . IR GENERIC HISTORICAL  05/12/2016   IR ANGIOGRAM SELECTIVE EACH ADDITIONAL VESSEL 05/12/2016 Corrie Mckusick, DO [facility]  . IR GENERIC HISTORICAL  05/12/2016   IR ANGIOGRAM FOLLOW UP STUDY 05/12/2016 Corrie Mckusick, DO [facility]  . IR GENERIC HISTORICAL  05/12/2016   IR ANGIOGRAM SELECTIVE EACH ADDITIONAL VESSEL 05/12/2016 Corrie Mckusick, DO [facility]  . LEFT HEART CATH AND CORONARY ANGIOGRAPHY Left 04/21/2017   Procedure: LEFT HEART CATH AND CORONARY ANGIOGRAPHY;  Surgeon: Isaias Cowman, MD;  Location: [facility];  Service: Cardiovascular;  Laterality: Left;       Family History  Problem Relation Age of Onset  . Diabetes Mother   . Stroke Father   . Ovarian cancer Sister     Social History   Tobacco Use  . Smoking status: Former Smoker    Packs/day: 0.50    Types: Cigarettes    Quit date: 1957    Years since quitting: 64.9  . Smokeless tobacco: Current User    Types: Snuff  Substance Use Topics  . Alcohol use: Yes  Comment: every evening with supper  . Drug use: No    Home Medications Prior to Admission medications   Medication Sig Start Date End Date Taking? Authorizing Provider  amLODipine (NORVASC) 5 MG tablet Take 1 tablet (5 mg total) by mouth daily. 05/20/16  Yes Bonnielee Haff, MD  aspirin 81 MG chewable tablet Chew 1 tablet (81 mg total) by mouth daily. 04/22/17  Yes Paraschos, Alexander, MD  atorvastatin (LIPITOR) 10 MG tablet Take 10 mg by mouth daily. 11/01/17 06/20/20 Yes [provider]  nitroGLYCERIN (NITROSTAT) 0.4 MG SL tablet Place 0.4 mg under the tongue every 5 (five) minutes as needed for chest pain.   Yes [provider]  alum & mag  hydroxide-simeth (MAALOX/MYLANTA) 200-200-20 MG/5ML suspension Take 15 mLs by mouth every 4 (four) hours as needed for indigestion or heartburn. Patient not taking: No sig reported 05/19/16   Bonnielee Haff, MD  clopidogrel (PLAVIX) 75 MG tablet Take 1 tablet (75 mg total) by mouth daily with breakfast. Patient not taking: No sig reported 04/22/17   Paraschos, Alexander, MD  feeding supplement, ENSURE ENLIVE, (ENSURE ENLIVE) LIQD Take 237 mLs by mouth 2 (two) times daily between meals. Patient not taking: No sig reported 05/19/16   Bonnielee Haff, MD  Maltodextrin-Xanthan Gum ([place]) POWD As needed Patient not taking: No sig reported 05/19/16   Bonnielee Haff, MD  metoprolol tartrate (LOPRESSOR) 25 MG tablet Take 1 tablet (25 mg total) by mouth 2 (two) times daily. Patient not taking: No sig reported 05/19/16   Bonnielee Haff, MD  thiamine 100 MG tablet Take 1 tablet (100 mg total) by mouth daily. Patient not taking: No sig reported 05/20/16   Bonnielee Haff, MD    Allergies    Patient has no known allergies.  Review of Systems   Review of Systems  Constitutional: Negative for chills and fever.  HENT: Negative for congestion, rhinorrhea and sore throat.   Eyes: Negative for visual disturbance.  Respiratory: Negative for cough and shortness of breath.   Cardiovascular: Negative for chest pain and leg swelling.  Gastrointestinal: Positive for blood in stool. Negative for abdominal pain, diarrhea, nausea and vomiting.  Genitourinary: Negative for dysuria.  Musculoskeletal: Negative for back pain and neck pain.  Skin: Negative for rash.  Neurological: Negative for dizziness, light-headedness and headaches.  Hematological: Does not bruise/bleed easily.  Psychiatric/Behavioral: Negative for confusion.    Physical Exam Updated Vital Signs BP (!) 152/75 (BP Location: Right Arm)   Pulse 85   Temp 98.8 F (37.1 C) (Oral)   Resp 18   Ht 1.702 m (5\' 7" )   Wt 47.6 kg    SpO2 99%   BMI 16.45 kg/m   Physical Exam Vitals and nursing note reviewed.  Constitutional:      General: He is not in acute distress.    Appearance: Normal appearance. He is well-developed and well-nourished.  HENT:     Head: Normocephalic and atraumatic.     Mouth/Throat:     Mouth: Mucous membranes are dry.  Eyes:     Extraocular Movements: Extraocular movements intact.     Conjunctiva/sclera: Conjunctivae normal.     Pupils: Pupils are equal, round, and reactive to light.  Cardiovascular:     Rate and Rhythm: Normal rate and regular rhythm.     Heart sounds: No murmur heard.   Pulmonary:     Effort: Pulmonary effort is normal. No respiratory distress.     Breath sounds: Normal breath sounds.  Abdominal:  Palpations: Abdomen is soft.     Tenderness: There is no abdominal tenderness.  Genitourinary:    Rectum: Guaiac result positive.     Comments: Grossly bloody Musculoskeletal:        General: No edema. Normal range of motion.     Cervical back: Neck supple.  Skin:    General: Skin is warm and dry.     Capillary Refill: Capillary refill takes less than 2 seconds.  Neurological:     General: No focal deficit present.     Mental Status: He is alert and oriented to person, place, and time.     Cranial Nerves: No cranial nerve deficit.     Sensory: No sensory deficit.     Motor: No weakness.  Psychiatric:        Mood and Affect: Mood and affect normal.     ED Results / Procedures / Treatments   Labs (all labs ordered are listed, but only abnormal results are displayed) Labs Reviewed  CBC - Abnormal; Notable for the following components:      Result Value   RBC 3.23 (*)    Hemoglobin 9.6 (*)    HCT 31.2 (*)    RDW 15.9 (*)    Platelets 125 (*)    All other components within normal limits  POC OCCULT BLOOD, ED - Abnormal; Notable for the following components:   Fecal Occult Bld POSITIVE (*)    All other components within normal limits  RESP PANEL BY  RT-PCR (FLU A&B, COVID) ARPGX2  COMPREHENSIVE METABOLIC PANEL  TYPE AND SCREEN    EKG None  Radiology No results found.  Procedures Procedures (including critical care time)  Medications Ordered in ED Medications  0.9 %  sodium chloride infusion ( Intravenous New Bag/Given 06/20/20 1213)  sodium chloride 0.9 % bolus 1,000 mL (1,000 mLs Intravenous New Bag/Given 06/20/20 1212)    ED Course  I have reviewed the triage vital signs and the nursing notes.  Pertinent labs & imaging results that were available during my care of the patient were reviewed by me and considered in my medical decision making (see chart for details).    MDM Rules/Calculators/A&P                         Patient with history of melena for the past 2 days. Now having grossly bloody stool. Hemodynamically stable hemoglobin without significant change from baseline but is down to 9.6. Patient without any abdominal pain. Heart rate in the upper 90s but not technically tachycardic not hypotensive.  Awaiting electrolytes. Patient will require admission.  Occult positive  Covid testing done. The patient's had the 2 series vaccine has not had the booster yet.  Patient remaining hemodynamically stable.  Heart rate now about 80.  Blood pressures are like 119 systolic.  Patient had significant bleed 4 years ago which required an ablation.  Patient no longer on Plavix as per family member.  Patient followed by the current [facility] clinic gastroenterology for years ago but not followed by them on a regular basis.  That clinic is in the [place] area.    Final Clinical Impression(s) / ED Diagnoses Final diagnoses:  Gastrointestinal hemorrhage, unspecified gastrointestinal hemorrhage type    Rx / DC Orders ED Discharge Orders    None       Fredia Sorrow, MD 06/20/20 1315   Discussed with [facility] gastroenterology.  They are considering scoping him today.  Call into unassigned medical  admission.    Fredia Sorrow, MD 06/20/20 1356

## 2020-06-20 NOTE — Sedation Documentation (Signed)
Patient is resting comfortably. Procedure started °

## 2020-06-20 NOTE — Sedation Documentation (Signed)
Pt being prepped for procedure at this time. VSS.

## 2020-06-20 NOTE — Progress Notes (Signed)
Patient back from IR, Level of care changed to progressive. MD paged about it.

## 2020-06-20 NOTE — Consult Note (Addendum)
[facility] Consult: 2:20 PM 06/20/2020  LOS: 0 days    Referring Provider: Dr Rogene Houston in ED  Primary Care Physician:  [name], MD in [facility] Primary Gastroenterologist: Althia Forts.      Reason for Consultation: GI bleed.   HPI: Dustin Howell is a 86 y.o. male.  PMH Acute lower GI bleed October/November 2017.  Underwent IR embolization of distal branch of middle colic artery.  Plan was for a colonoscopy but he was not interested in this and MDs felt that given his advanced age, frailty colonoscopy was not essential.   Required transfusion with multiple PRBCs.  Cardiac stents placed 04/2017.  Previous Plavix but has not taken this for many years.  Thrombocytopenia with platelets as low as 41 in 2017.  Getting Wednesday about 6 PM he had a loose, black, tarry stool, one later that evening, 2 overnight.  Since arrival in the ED the stools have become more frankly bloody.  No abdominal pain, no dizziness, no syncope.  No change in appetite, weight stable.  No nausea, vomiting.  No new meds.  Does not use NSAIDs.  Patient lives alone, his daughter [name], cell phone [phone], lives about 15 minutes away.  Patient performs all his own ADLs he can use a shovel, dig holes/plant, mow his lawn at baseline.     Hemodynamically stable in the ED. BPs 140s-150s/70s.  Heart rate in the 80s.  Excellent saturations at or near 100%. Hgb 9.6, last comp value was 9.8 in September 2019. On DRE by ED staff, there was grossly bloody stool.     Past Medical History:  Diagnosis Date  . GERD (gastroesophageal reflux disease)   . GI bleed   . HOH (hard of hearing)     Past Surgical History:  Procedure Laterality Date  . APPENDECTOMY    . bilat. rotator cuff issues Bilateral    "stiff" shoulders  .  CORONARY STENT INTERVENTION N/A 04/21/2017   Procedure: CORONARY STENT INTERVENTION;  Surgeon: Isaias Cowman, MD;  Location: [facility];  Service: Cardiovascular;  Laterality: N/A;  . IR GENERIC HISTORICAL  05/12/2016   IR EMBO ART  VEN HEMORR LYMPH EXTRAV  INC GUIDE ROADMAPPING 05/12/2016 Corrie Mckusick, DO [facility]  . IR GENERIC HISTORICAL  05/12/2016   IR ANGIOGRAM VISCERAL SELECTIVE 05/12/2016 Corrie Mckusick, DO [facility]  . IR GENERIC HISTORICAL  05/12/2016   IR US GUIDE VASC ACCESS RIGHT 05/12/2016 Corrie Mckusick, DO [facility]  . IR GENERIC HISTORICAL  05/12/2016   IR ANGIOGRAM SELECTIVE EACH ADDITIONAL VESSEL 05/12/2016 Corrie Mckusick, DO [facility]  . IR GENERIC HISTORICAL  05/12/2016   IR ANGIOGRAM SELECTIVE EACH ADDITIONAL VESSEL 05/12/2016 Corrie Mckusick, DO [facility]  . IR GENERIC HISTORICAL  05/12/2016   IR ANGIOGRAM FOLLOW UP STUDY 05/12/2016 Corrie Mckusick, DO [facility]  . IR GENERIC HISTORICAL  05/12/2016   IR ANGIOGRAM SELECTIVE EACH ADDITIONAL VESSEL 05/12/2016 Corrie Mckusick, DO [facility]  . LEFT HEART CATH AND CORONARY ANGIOGRAPHY Left 04/21/2017   Procedure: LEFT HEART CATH AND CORONARY  ANGIOGRAPHY;  Surgeon: Isaias Cowman, MD;  Location: [facility];  Service: Cardiovascular;  Laterality: Left;    Prior to Admission medications   Medication Sig Start Date End Date Taking? Authorizing Provider  amLODipine (NORVASC) 5 MG tablet Take 1 tablet (5 mg total) by mouth daily. 05/20/16  Yes Bonnielee Haff, MD  aspirin 81 MG chewable tablet Chew 1 tablet (81 mg total) by mouth daily. 04/22/17  Yes Paraschos, Alexander, MD  atorvastatin (LIPITOR) 10 MG tablet Take 10 mg by mouth daily. 11/01/17 06/20/20 Yes [provider]  nitroGLYCERIN (NITROSTAT) 0.4 MG SL tablet Place 0.4 mg under the tongue every 5 (five) minutes as needed for chest pain.   Yes [provider]  alum & mag hydroxide-simeth (MAALOX/MYLANTA)  200-200-20 MG/5ML suspension Take 15 mLs by mouth every 4 (four) hours as needed for indigestion or heartburn. Patient not taking: No sig reported 05/19/16   Bonnielee Haff, MD  clopidogrel (PLAVIX) 75 MG tablet Take 1 tablet (75 mg total) by mouth daily with breakfast. Patient not taking: No sig reported 04/22/17   Paraschos, Alexander, MD  feeding supplement, ENSURE ENLIVE, (ENSURE ENLIVE) LIQD Take 237 mLs by mouth 2 (two) times daily between meals. Patient not taking: No sig reported 05/19/16   Bonnielee Haff, MD  Maltodextrin-Xanthan Gum ([place]) POWD As needed Patient not taking: No sig reported 05/19/16   Bonnielee Haff, MD  metoprolol tartrate (LOPRESSOR) 25 MG tablet Take 1 tablet (25 mg total) by mouth 2 (two) times daily. Patient not taking: No sig reported 05/19/16   Bonnielee Haff, MD  thiamine 100 MG tablet Take 1 tablet (100 mg total) by mouth daily. Patient not taking: No sig reported 05/20/16   Bonnielee Haff, MD    Scheduled Meds:  Infusions: . sodium chloride 75 mL/hr at 06/20/20 1213   PRN Meds:    Allergies as of 06/20/2020  . (No Known Allergies)    Family History  Problem Relation Age of Onset  . Diabetes Mother   . Stroke Father   . Ovarian cancer Sister     Social History   Socioeconomic History  . Marital status: Widowed    Spouse name: Not on file  . Number of children: Not on file  . Years of education: Not on file  . Highest education level: Not on file  Occupational History  . Not on file  Tobacco Use  . Smoking status: Former Smoker    Packs/day: 0.50    Types: Cigarettes    Quit date: 1957    Years since quitting: 64.9  . Smokeless tobacco: Current User    Types: Snuff  Substance and Sexual Activity  . Alcohol use: Yes    Comment: every evening with supper  . Drug use: No  . Sexual activity: Never  Other Topics Concern  . Not on file  Social History Narrative  . Not on file   Social Determinants of Health    Financial Resource Strain: Not on file  Food Insecurity: Not on file  Transportation Needs: Not on file  Physical Activity: Not on file  Stress: Not on file  Social Connections: Not on file  Intimate Partner Violence: Not on file    REVIEW OF SYSTEMS: Constitutional: No weakness, no dizziness ENT:  No nose bleeds Pulm: No shortness of breath or cough CV:  No palpitations, no LE edema.  No chest pressure.  No angina. GU:  No hematuria, no frequency GI: See HPI. Heme: Denies unusual  or excessive bleeding or bruising. Transfusions: Transfused with PRBCs in 2017. Neuro:  No headaches, no peripheral tingling or numbness.  No syncope, no seizures Derm:  No itching, no rash or sores.  Endocrine:  No sweats or chills.  No polyuria or dysuria Immunization: Vaccinated against COVID-19 Travel:  None beyond local counties in last few months.    PHYSICAL EXAM: Vital signs in last 24 hours: Vitals:   06/20/20 1319 06/20/20 1352  BP: (!) 144/75 (!) 155/75  Pulse: 87 83  Resp: 18 16  Temp:    SpO2: 99% 99%   Wt Readings from Last 3 Encounters:  06/20/20 47.6 kg  04/01/18 51.3 kg  03/15/18 51.4 kg    General: Pleasant, alert but somewhat hard of hearing.  Looks frail but not acutely or chronically ill. Head: No facial asymmetry or swelling.  No signs of head trauma. Eyes: Conjunctiva minimally pale.  No scleral icterus. Ears: Hard of hearing though he is wearing a right sided hearing aid Nose: No congestion or discharge Mouth: Oral mucosa is moist, pink, clear.  Dentures in place, not removed for exam.  Tongue midline. Neck: No JVD, no masses, no thyromegaly Lungs: Clear bilaterally with excellent breath sounds.  No cough.  No labored breathing Heart: RRR.  No MRG.  S1, S2 present Abdomen: Soft, not distended or tender.  Active bowel sounds.  No HSM, masses, bruits, hernias..   Rectal: Did not repeat this exam.  There was gross blood on ED physician DRE. Musc/Skeltl: No joint  redness, swelling.  Some arthritic deformities in the fingers and spinal kyphosis.  Overall looks osteoporotic. Extremities: No CCE. Neurologic: Alert.  Oriented x3.  Moves all 4 limbs with slight tremor in the hands.  Strength not tested. Skin: No rash, no sores.  Some purpura on the arms. Tattoos: None observed Nodes: Cervical adenopathy Psych: Cooperative, pleasant, normal affect  Intake/Output from previous day: No intake/output data recorded. Intake/Output this shift: No intake/output data recorded.  LAB RESULTS: Recent Labs    06/20/20 1020  WBC 8.1  HGB 9.6*  HCT 31.2*  PLT 125*   BMET Lab Results  Component Value Date   NA 139 06/20/2020   NA 140 03/15/2018   NA 139 04/22/2017   K 4.5 06/20/2020   K 4.1 03/15/2018   K 4.0 04/22/2017   CL 105 06/20/2020   CL 108 03/15/2018   CL 110 04/22/2017   CO2 24 06/20/2020   CO2 24 03/15/2018   CO2 26 04/22/2017   GLUCOSE 114 (H) 06/20/2020   GLUCOSE 103 (H) 03/15/2018   GLUCOSE 88 04/22/2017   BUN 25 (H) 06/20/2020   BUN 25 (H) 03/15/2018   BUN 16 04/22/2017   CREATININE 1.10 06/20/2020   CREATININE 1.18 03/15/2018   CREATININE 0.95 04/22/2017   CALCIUM 9.1 06/20/2020   CALCIUM 9.0 03/15/2018   CALCIUM 8.4 (L) 04/22/2017   LFT Recent Labs    06/20/20 1020  PROT 6.7  ALBUMIN 4.0  AST 31  ALT 17  ALKPHOS 75  BILITOT 0.6   PT/INR Lab Results  Component Value Date   INR 1.25 05/11/2016   Hepatitis Panel No results for input(s): HEPBSAG, HCVAB, HEPAIGM, HEPBIGM in the last 72 hours. C-Diff No components found for: CDIFF Lipase     Component Value Date/Time   LIPASE 37 05/11/2016 1741    Drugs of Abuse  No results found for: LABOPIA, COCAINSCRNUR, LABBENZ, AMPHETMU, THCU, LABBARB   RADIOLOGY STUDIES: No results found.  IMPRESSION:   *   Acute GI bleed.  Started off as melena but frank blood today per ED staff. History lower GI, presumed diverticular, bleed fall 2017.  Treated with  embolization.  *   History of blood loss anemia associated with 2017 GI bleed.  Current Hb 9.6  *   Noncritical thrombocytopenia at 125.  Chronic thrombocytopenia dates back to at least 2017.   PLAN:     *   Proceed to CTAP with angiography.  Has no compromised renal function based on today's cmet so no contraindication to IV contrast   [name]  06/20/2020, 2:20 PM Phone [phone]     Attending Physician Note   I have taken a history, examined the patient and reviewed the chart. I agree with the Advanced Practitioner's note, impression and recommendations.   Impression: * Acute GI bleed, probable LGI source however UGI source possible. History of diverticulosis. R/O bleed from diverticulosis, AVM, ulcer, neoplasm, etc. Prior ascending colon bleed in 2017 requiring IR embolization of distal branch of middle colic artery. Colonoscopy considered in 2017 but ultimately was not performed.   * ABL anemia on chronic anemia. IDA noted in 2019. History of thrombocytopenia. History of low grade B cell lymphoproliferative disorder.   Recommend: * CT AP with angio now  * IV PPI bid for now * Trend CBC * Transfuse for Hgb < 7 * Consider endoscopic procedures however given age, frailty might be best to avoid if possible   Lucio Edward, MD [facility]

## 2020-06-20 NOTE — Sedation Documentation (Signed)
5Fr sheath removed from RIGHT femoral artery by Armando Reichert, RTR. Hemostasis achieved using Exoseal closure device. Groin level 0.

## 2020-06-21 ENCOUNTER — Encounter (HOSPITAL_COMMUNITY)
Admission: EM | Disposition: A | Discharge status: Home / Self Care | Payer: Self-pay | Source: Home / Self Care | Attending: Internal Medicine

## 2020-06-21 DIAGNOSIS — Z955 Presence of coronary angioplasty implant and graft: Secondary | ICD-10-CM | POA: Diagnosis not present

## 2020-06-21 DIAGNOSIS — I10 Essential (primary) hypertension: Secondary | ICD-10-CM

## 2020-06-21 DIAGNOSIS — D6959 Other secondary thrombocytopenia: Secondary | ICD-10-CM | POA: Diagnosis present

## 2020-06-21 DIAGNOSIS — E785 Hyperlipidemia, unspecified: Secondary | ICD-10-CM

## 2020-06-21 DIAGNOSIS — D62 Acute posthemorrhagic anemia: Secondary | ICD-10-CM | POA: Diagnosis present

## 2020-06-21 DIAGNOSIS — Z87891 Personal history of nicotine dependence: Secondary | ICD-10-CM | POA: Diagnosis not present

## 2020-06-21 DIAGNOSIS — D509 Iron deficiency anemia, unspecified: Secondary | ICD-10-CM | POA: Diagnosis not present

## 2020-06-21 DIAGNOSIS — K219 Gastro-esophageal reflux disease without esophagitis: Secondary | ICD-10-CM | POA: Diagnosis present

## 2020-06-21 DIAGNOSIS — K5521 Angiodysplasia of colon with hemorrhage: Principal | ICD-10-CM

## 2020-06-21 DIAGNOSIS — R933 Abnormal findings on diagnostic imaging of other parts of digestive tract: Secondary | ICD-10-CM

## 2020-06-21 DIAGNOSIS — I2510 Atherosclerotic heart disease of native coronary artery without angina pectoris: Secondary | ICD-10-CM

## 2020-06-21 DIAGNOSIS — E119 Type 2 diabetes mellitus without complications: Secondary | ICD-10-CM | POA: Diagnosis present

## 2020-06-21 DIAGNOSIS — Z7902 Long term (current) use of antithrombotics/antiplatelets: Secondary | ICD-10-CM | POA: Diagnosis not present

## 2020-06-21 DIAGNOSIS — K921 Melena: Secondary | ICD-10-CM | POA: Diagnosis present

## 2020-06-21 DIAGNOSIS — Z79899 Other long term (current) drug therapy: Secondary | ICD-10-CM | POA: Diagnosis not present

## 2020-06-21 DIAGNOSIS — D47Z9 Other specified neoplasms of uncertain behavior of lymphoid, hematopoietic and related tissue: Secondary | ICD-10-CM

## 2020-06-21 DIAGNOSIS — K5731 Diverticulosis of large intestine without perforation or abscess with bleeding: Secondary | ICD-10-CM | POA: Diagnosis present

## 2020-06-21 DIAGNOSIS — Z20822 Contact with and (suspected) exposure to covid-19: Secondary | ICD-10-CM | POA: Diagnosis present

## 2020-06-21 DIAGNOSIS — K922 Gastrointestinal hemorrhage, unspecified: Secondary | ICD-10-CM | POA: Diagnosis not present

## 2020-06-21 DIAGNOSIS — Z7982 Long term (current) use of aspirin: Secondary | ICD-10-CM | POA: Diagnosis not present

## 2020-06-21 DIAGNOSIS — E611 Iron deficiency: Secondary | ICD-10-CM | POA: Diagnosis present

## 2020-06-21 DIAGNOSIS — K920 Hematemesis: Secondary | ICD-10-CM | POA: Diagnosis not present

## 2020-06-21 HISTORY — PX: FLEXIBLE SIGMOIDOSCOPY: SHX5431

## 2020-06-21 HISTORY — PX: HOT HEMOSTASIS: SHX5433

## 2020-06-21 LAB — BASIC METABOLIC PANEL
Anion gap: 9 (ref 5–15)
BUN: 17 mg/dL (ref 8–23)
CO2: 21 mmol/L — ABNORMAL LOW (ref 22–32)
Calcium: 8.2 mg/dL — ABNORMAL LOW (ref 8.9–10.3)
Chloride: 107 mmol/L (ref 98–111)
Creatinine, Ser: 0.89 mg/dL (ref 0.61–1.24)
GFR, Estimated: >60 mL/min (ref >60)
Glucose, Bld: 88 mg/dL (ref 70–99)
Potassium: 3.7 mmol/L (ref 3.5–5.1)
Sodium: 137 mmol/L (ref 135–145)

## 2020-06-21 LAB — CBC
HCT: 22 % — ABNORMAL LOW (ref 39.0–52.0)
Hemoglobin: 7 g/dL — ABNORMAL LOW (ref 13.0–17.0)
MCH: 30.3 pg (ref 26.0–34.0)
MCHC: 31.8 g/dL (ref 30.0–36.0)
MCV: 95.2 fL (ref 80.0–100.0)
Platelets: 93 10*3/uL — ABNORMAL LOW (ref 150–400)
RBC: 2.31 MIL/uL — ABNORMAL LOW (ref 4.22–5.81)
RDW: 15.9 % — ABNORMAL HIGH (ref 11.5–15.5)
WBC: 5.8 10*3/uL (ref 4.0–10.5)
nRBC: 0 % (ref 0.0–0.2)

## 2020-06-21 SURGERY — SIGMOIDOSCOPY, FLEXIBLE

## 2020-06-21 MED ORDER — SODIUM CHLORIDE 0.9 % IV SOLN
510.0000 mg | Freq: Once | INTRAVENOUS | Status: AC
  Administered 2020-06-21: 510 mg via INTRAVENOUS
  Filled 2020-06-21: qty 17

## 2020-06-21 MED ORDER — LACTATED RINGERS IV SOLN: INTRAVENOUS | Status: AC

## 2020-06-21 MED ORDER — SODIUM CHLORIDE 0.9% IV SOLUTION: Freq: Once | INTRAVENOUS | Status: AC

## 2020-06-21 NOTE — Progress Notes (Signed)
HD#0 Subjective:  Overnight Events: No acute overnight events  Patient evaluated at bedside. Just brought back from procedure. States he has not more bloody BM. Would like to go home home tomorrow.   Objective:  Vital signs in last 24 hours: Vitals:   06/20/20 2313 06/21/20 0103 06/21/20 0206 06/21/20 0339  BP: 138/71 (!) 118/59 (!) 113/59 (!) 119/56  Pulse: 81 82 74 71  Resp: 18 14 18 18   Temp: 98.4 F (36.9 C) 97.8 F (36.6 C) 98.1 F (36.7 C) 97.8 F (36.6 C)  TempSrc: Oral Oral Oral Oral  SpO2: 100% 100% 100% 99%  Weight: 49.4 kg     Height: 5\' 7"  (1.702 m)      Supplemental O2: Room Air SpO2: 99 % O2 Flow Rate (L/min): 2 L/min   Physical Exam:  Physical Exam Constitutional:      Appearance: Normal appearance.  Cardiovascular:     Rate and Rhythm: Normal rate and regular rhythm.  Pulmonary:     Effort: Pulmonary effort is normal.     Breath sounds: Normal breath sounds.  Abdominal:     General: Abdomen is flat. Bowel sounds are normal.     Palpations: Abdomen is soft.  Skin:    General: Skin is warm and dry.     Capillary Refill: Capillary refill takes less than 2 seconds.  Neurological:     General: No focal deficit present.     Mental Status: He is alert and oriented to person, place, and time. Mental status is at baseline.     Filed Weights   06/20/20 1009 06/20/20 2313  Weight: 47.6 kg 49.4 kg     Intake/Output Summary (Last 24 hours) at 06/21/2020 0552 Last data filed at 06/21/2020 0315 Gross per 24 hour  Intake 386.06 ml  Output -  Net 386.06 ml   Net IO Since Admission: 386.06 mL [06/21/20 0552]  Pertinent Labs: CBC Latest Ref Rng & Units 06/21/2020 06/20/2020 06/20/2020  WBC 4.0 - 10.5 K/uL 5.8 6.0 8.1  Hemoglobin 13.0 - 17.0 g/dL 7.0(L) 8.2(L) 9.6(L)  Hematocrit 39.0 - 52.0 % 22.0(L) 25.3(L) 31.2(L)  Platelets 150 - 400 K/uL 93(L) 104(L) 125(L)    CMP Latest Ref Rng & Units 06/21/2020 06/20/2020 06/20/2020  Glucose 70 - 99 mg/dL  88 86 114(H)  BUN 8 - 23 mg/dL 17 18 25(H)  Creatinine 0.61 - 1.24 mg/dL 0.89 0.90 1.10  Sodium 135 - 145 mmol/L 137 136 139  Potassium 3.5 - 5.1 mmol/L 3.7 4.1 4.5  Chloride 98 - 111 mmol/L 107 106 105  CO2 22 - 32 mmol/L 21(L) 22 24  Calcium 8.9 - 10.3 mg/dL 8.2(L) 7.9(L) 9.1  Total Protein 6.5 - 8.1 g/dL - 5.6(L) 6.7  Total Bilirubin 0.3 - 1.2 mg/dL - 0.4 0.6  Alkaline Phos 38 - 126 U/L - 60 75  AST 15 - 41 U/L - 26 31  ALT 0 - 44 U/L - 15 17    Imaging: CT Angio Abd/Pel w/ and/or w/o  Result Date: 06/20/2020 CLINICAL DATA:  86 year old with melena. History of prior lower GI bleeding and catheter directed embolization in 2017. EXAM: CTA ABDOMEN AND PELVIS WITHOUT AND WITH CONTRAST TECHNIQUE: Multidetector CT imaging of the abdomen and pelvis was performed using the standard protocol during bolus administration of intravenous contrast. Multiplanar reconstructed images and MIPs were obtained and reviewed to evaluate the vascular anatomy. CONTRAST:  164mL OMNIPAQUE IOHEXOL 350 MG/ML SOLN COMPARISON:  05/11/2016 FINDINGS: VASCULAR Aorta: There is  stable penetrating ulcer along the left anterior proximal abdominal aorta at the 1 o'clock position on sequence 7, image 15. This small penetrating ulcer measures roughly 0.6 cm. Infrarenal abdominal aorta is slightly aneurysmal measuring up to 3.0 cm on sequence 7, image 31. There is irregular mural thrombus with ulcerative plaque or penetrating ulcer along the left posterior aspect of the abdominal aorta at the 5 o'clock position. This ulcerative plaque or penetrating ulcer measures roughly 1.0 cm on sequence 10, image image 76. There is more ulceration in this area compared to 2017. No significant aortic stenosis. No evidence for aortic dissection. 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 SMA: Patent without evidence of aneurysm, dissection, vasculitis or significant stenosis. Renals: Both renal arteries are patent without evidence of  aneurysm, dissection, vasculitis, fibromuscular dysplasia or significant stenosis. IMA: Patent without evidence of aneurysm, dissection, vasculitis or significant stenosis. Inflow: Atherosclerotic plaque involving the common, internal and external iliac arteries bilaterally without significant stenosis. No evidence for aneurysm or dissection involving the iliac arteries. Proximal Outflow: Proximal femoral arteries are patent bilaterally. Veins: Iliac veins are patent. IVC and renal veins are patent. Portal venous system is patent. Review of the MIP images confirms the above findings. NON-VASCULAR Lower chest: Lung bases are clear. Hepatobiliary: Normal appearance of the liver, gallbladder and portal venous system. No biliary dilatation. Pancreas: Unremarkable. No pancreatic ductal dilatation or surrounding inflammatory changes. Spleen: Normal in size without focal abnormality. Adrenals/Urinary Tract: Normal appearance of the adrenal glands. 3 mm calcification in the right kidney is suggestive for a nonobstructive stone. Small exophytic cyst in the right kidney lower pole measures 1.0 cm. No hydronephrosis. Cyst involving the left kidney upper pole measures up to 5.9 cm. Probable left parapelvic renal cysts. Small cyst in left kidney lower pole. Normal appearance of the urinary bladder. Stomach/Bowel: Small focus of arterial extravasation in the rectosigmoid colon on sequence 7, image 87. There is additional contrast within the colon on the delayed images on sequence 13, image 52. Findings compatible with an area of active GI bleeding. Extensive diverticulosis involving the sigmoid colon. No other areas are suggestive for active GI bleeding. Normal appearance of the stomach. Lymphatic: No significant lymph node enlargement in the abdomen or pelvis. Reproductive: Prostate is unremarkable. Other: Negative for ascites.  Negative for free air. Musculoskeletal: Disc space narrowing at L5-S1. IMPRESSION: VASCULAR 1.  Positive for active GI bleeding. Bleeding is located near the rectosigmoid junction. Etiology for the bleeding is uncertain but the patient does have significant diverticular disease in this area. 2. 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 Enlarged penetrating ulcer or ulcerative plaque along the left posterior aspect of the infrarenal abdominal aorta measuring up to 1.0 cm. 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 3. Chronic stenosis involving the celiac trunk. NON-VASCULAR 1. Colonic diverticulosis. 2. Bilateral renal cysts. 3. Nonobstructive right kidney stone. These results were called by telephone at the time of interpretation on 06/20/2020 at 4:55 pm to provider Dr. Karle Starch, who verbally acknowledged these results. Electronically Signed   By: Markus Daft M.D.   On: 06/20/2020 17:14    Selective mesenteric arteriogram    no bleed identified  Assessment/Plan:   Active Problems:   Lower GI bleed   Patient Summary: Dustin Howell is a 86 y.o. male with a pertinent past medical history of GI bleed (4 years ago requiring ablation), CAD (s/p DES to OM1), presented to [facility] with  a chief complaint of lower GI bleed admitted for further medical management.  #Lower GI bleed: Hgb of 7 this morning. CT with active bleeding near the rectosigmoid junction. Mesenteric arteriogram without bleeding identified. - Sigmoidoscopy this afternoon multiple moderate diverticulosis with evidence of old bleeding. No active bleeding noted. Coagluation for on small angiodysplaic lesion to prevent future bleeding.  - GI consulted, appreciate recommendations - Transfuse 1u PRB, LR 125 ml/hr - follow up CBC - Iron deficiency 1000mg , will give a dose of feraheme today, may need to start on oral iron once outpatient and more stable - avoid NSAIDs   #CAD s/p DES in 2018 - No longer on plavix due to GI bleed 4 years ago.  - Hold  aspirin  #Hypertension, hyperlipidemia - Continue atorvastatin, hold amlodipine in setting of GI bleed  #Low grade B cell lymphoproliferative disorder   Not on treatment given age and frailty. Platelet levels appear stable compared to prior studies in 2019  Diet: Liquid IVF: LR,125cc/hr VTE: None Code: Full  Dispo: Anticipated discharge to Home in 1 days pending medical management.Iona Beard, MD 06/21/2020, 5:52 AM Pager: [phone]  Please contact the on call pager after 5 pm and on weekends at [phone].

## 2020-06-21 NOTE — Progress Notes (Signed)
Tap water enema given

## 2020-06-21 NOTE — Interval H&P Note (Signed)
History and Physical Interval Note:  06/21/2020 1:53 PM  Dustin Howell  has presented today for surgery, with the diagnosis of painless hematochezia.  suspect divertic bleed.  The various methods of treatment have been discussed with the patient and family. After consideration of risks, benefits and other options for treatment, the patient has consented to  Procedure(s): FLEXIBLE SIGMOIDOSCOPY (N/A) HOT HEMOSTASIS (ARGON PLASMA COAGULATION/BICAP) (N/A) as a surgical intervention.  The patient's history has been reviewed, patient examined, no change in status, stable for surgery.  I have reviewed the patient's chart and labs.  Questions were answered to the patient's satisfaction.     Pricilla Riffle. Fuller Plan

## 2020-06-21 NOTE — Op Note (Addendum)
[facility] Patient Name: Dustin Howell Procedure Date : 06/21/2020 MRN: [id number] Attending MD: Ladene Artist , MD Date of Birth: 12-14-1924 CSN: [id number] Age: 86 Admit Type: Inpatient Procedure:                Flexible Sigmoidoscopy Indications:              Hematochezia, Abnormal CT of the GI tract - CTA                            with bleeding site near rectosigmoid junction. Providers:                Pricilla Riffle. Fuller Plan, MD, Glori Bickers, RN, Lesia Sago, Technician Referring MD:             Johnnette Gourd, DO Medicines:                None Complications:            No immediate complications. Estimated Blood Loss:     Estimated blood loss: none. Procedure:                Pre-Anesthesia Assessment:                           - Prior to the procedure, a History and Physical                            was performed, and patient medications and                            allergies were reviewed. The patient's tolerance of                            previous anesthesia was also reviewed. The risks                            and benefits of the procedure and the sedation                            options and risks were discussed with the patient.                            All questions were answered, and informed consent                            was obtained. Prior Anticoagulants: The patient has                            taken no previous anticoagulant or antiplatelet                            agents. ASA Grade Assessment: III - A patient with  severe systemic disease. After reviewing the risks                            and benefits, the patient was deemed in                            satisfactory condition to undergo the procedure.                           After obtaining informed consent, the scope was                            passed under direct vision. The [id number]-[id number]                             ([id number] Olympus pediatric colonscope was                            introduced through the anus and advanced to 25 cm                            in the sigmoid colon. The flexible sigmoidoscopy                            was somewhat difficult due to poor endoscopic                            visualization, retained old dark blood. The quality                            of the bowel preparation was inadequate despite                            extensive lavage the old blood could not be                            adequately cleared. Scope In: 12:41:15 PM Scope Out: 1:00:50 PM Total Procedure Duration: 0 hours 19 minutes 35 seconds  Findings:      The perianal and digital rectal examinations were normal.      Multiple medium-mouthed diverticula were found in the sigmoid colon.       There was narrowing of the colon in association with the diverticular       opening. There was evidence of diverticular spasm. There was evidence of       old dark blood throughout the rectum and sigmoid colon. No active       bleeding noted.      A single small localized angiodysplastic lesion was found in the distal       rectum. Coagulation for bleeding prevention using argon plasma was       successful.      The exam was otherwise without abnormality. Impression:               - Preparation of the colon was inadequate.                           -  Moderate diverticulosis in the sigmoid colon.                            There was narrowing of the colon in association                            with the diverticular opening. There was evidence                            of diverticular spasm. There was evidence of past                            bleeding with old dark blood throughout the rectum                            and sigmoid colon.                           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 Treated with argon plasma coagulation (APC).                            - The examination was otherwise normal.                           - No specimens collected. Recommendation:           - Return patient to hospital ward for ongoing care.                           - Observe the patient for rebleeding.                           - Full liquid diet today.                           - Diverticular vs AVM bleed.                           - Avoid ASA/NSAIDs for 2 weeks. Procedure Code(s):        --- Professional ---                           [id number], Sigmoidoscopy, flexible; with control of                            bleeding, any method Diagnosis Code(s):        --- Professional ---                           K57.31, Diverticulosis of large intestine without                            perforation or abscess  with bleeding                           K55.21, Angiodysplasia of colon with hemorrhage                           K92.1, Melena (includes Hematochezia)                           R93.3, Abnormal findings on diagnostic imaging of                            other parts of digestive tract CPT copyright 2019 [employer]. All rights reserved. The codes documented in this report are preliminary and upon coder review may  be revised to meet current compliance requirements. Ladene Artist, MD 06/21/2020 1:13:28 PM This report has been signed electronically. Number of Addenda: 0

## 2020-06-21 NOTE — Progress Notes (Signed)
Referring Physician(s): Sandi Carne PA  Supervising Physician: Daryll Brod  Patient Status:  [facility] - In-pt  Chief Complaint:  Rectal bleeding s/p arteriogram on 12.9.21 by Dr. Lucrezia Europe with no extravasation noted.  Subjective:  None. Patient states that he feels better but has not had a bowel movement today.  Currently without any significant complaints. Daughter at bedside. Patient alert sitting up in bed eating breakfast. Denies any fevers, headache, chest pain, SOB, cough, abdominal pain, nausea, vomiting or bleeding.   Allergies: Patient has no known allergies.  Medications: Prior to Admission medications   Medication Sig Start Date End Date Taking? Authorizing Provider  amLODipine (NORVASC) 5 MG tablet Take 1 tablet (5 mg total) by mouth daily. 05/20/16  Yes Bonnielee Haff, MD  aspirin 81 MG chewable tablet Chew 1 tablet (81 mg total) by mouth daily. 04/22/17  Yes Paraschos, Alexander, MD  atorvastatin (LIPITOR) 10 MG tablet Take 10 mg by mouth daily. 11/01/17 06/20/20 Yes [provider]  nitroGLYCERIN (NITROSTAT) 0.4 MG SL tablet Place 0.4 mg under the tongue every 5 (five) minutes as needed for chest pain.   Yes [provider]  alum & mag hydroxide-simeth (MAALOX/MYLANTA) 200-200-20 MG/5ML suspension Take 15 mLs by mouth every 4 (four) hours as needed for indigestion or heartburn. Patient not taking: No sig reported 05/19/16   Bonnielee Haff, MD  clopidogrel (PLAVIX) 75 MG tablet Take 1 tablet (75 mg total) by mouth daily with breakfast. Patient not taking: No sig reported 04/22/17   Paraschos, Alexander, MD  feeding supplement, ENSURE ENLIVE, (ENSURE ENLIVE) LIQD Take 237 mLs by mouth 2 (two) times daily between meals. Patient not taking: No sig reported 05/19/16   Bonnielee Haff, MD  Maltodextrin-Xanthan Gum ([place]) POWD As needed Patient not taking: No sig reported 05/19/16   Bonnielee Haff, MD  metoprolol tartrate (LOPRESSOR) 25  MG tablet Take 1 tablet (25 mg total) by mouth 2 (two) times daily. Patient not taking: No sig reported 05/19/16   Bonnielee Haff, MD  thiamine 100 MG tablet Take 1 tablet (100 mg total) by mouth daily. Patient not taking: No sig reported 05/20/16   Bonnielee Haff, MD     Vital Signs: BP (!) 143/107   Pulse 77   Temp (!) 97.4 F (36.3 C) (Oral)   Resp (!) 21   Ht 5\' 7"  (1.702 m)   Wt 108 lb 14.5 oz (49.4 kg)   SpO2 100%   BMI 17.06 kg/m   Physical Exam  Imaging: IR Angiogram Visceral Selective  Result Date: 06/21/2020 CLINICAL DATA:  GI bleed, localized to rectosigmoid junction on recent CTA. EXAM: SELECTIVE VISCERAL ARTERIOGRAPHY; ADDITIONAL ARTERIOGRAPHY; IR ULTRASOUND GUIDANCE VASC ACCESS RIGHT ANESTHESIA/SEDATION: Intravenous Fentanyl 48mcg and Versed 1.5mg  were administered as conscious sedation during continuous monitoring of the patient's level of consciousness and physiological / cardiorespiratory status by the radiology RN, with a total moderate sedation time of 40 minutes. MEDICATIONS: Lidocaine 1% subcutaneous CONTRAST:  71mL OMNIPAQUE IOHEXOL 300 MG/ML SOLN, 23mL OMNIPAQUE IOHEXOL 300 MG/ML SOLN PROCEDURE: The procedure, risks (including but not limited to bleeding, infection, organ damage ), benefits, and alternatives were explained to the patient. Questions regarding the procedure were encouraged and answered. The patient understands and consents to the procedure. Right femoral region prepped and draped in usual sterile fashion. Maximal barrier sterile technique was utilized including caps, mask, sterile gowns, sterile gloves, sterile drape, hand hygiene and skin antiseptic. The right common femoral artery was localized under ultrasound. Under real-time  ultrasound guidance, the vessel was accessed with a 21-gauge micropuncture needle, exchanged over a 018 guidewire for a transitional dilator, through which a 035 guidewire was advanced. Over this, a 5 [place] vascular sheath was  placed, through which a 5 [place] RIM catheter was advanced and used to selectively catheterize the inferior mesenteric artery for selective arteriography in multiple projections. A coaxial Renegade microcatheter with 014 fathom guidewire was advanced and used to selectively catheterize multiple second third order branches for arteriography in multiple projections. The microcatheter was removed and additional arteriography through the RIM was performed additional projections. The microcatheter was then advanced again for additional arteriography. The microcatheter, catheter and sheath were removed and hemostasis achieved with the aid of the Exoseal device after confirmatory femoral arteriography. The patient tolerated the procedure well. COMPLICATIONS: None immediate FINDINGS: Small focus of active arterial extravasation in the distal sigmoid colon on the initial selective arteriogram. However, this could not be localized on subsequent subselective and oblique arteriography to allow approach for embolization. No evidence of early draining vein, AVM, mass or other lesion to suggest underlying etiology of the bleeding. Venous phase confirms patency of the IMV and portal venous system. IMPRESSION: 1. Incomplete localization of distal sigmoid active arterial extravasation. Recommend sigmoidoscopy for further evaluation and possible treatment. Electronically Signed   By: Lucrezia Europe M.D.   On: 06/21/2020 08:38   IR Angiogram Visceral Selective  Result Date: 06/21/2020 CLINICAL DATA:  GI bleed, localized to rectosigmoid junction on recent CTA. EXAM: SELECTIVE VISCERAL ARTERIOGRAPHY; ADDITIONAL ARTERIOGRAPHY; IR ULTRASOUND GUIDANCE VASC ACCESS RIGHT ANESTHESIA/SEDATION: Intravenous Fentanyl 69mcg and Versed 1.5mg  were administered as conscious sedation during continuous monitoring of the patient's level of consciousness and physiological / cardiorespiratory status by the radiology RN, with a total moderate sedation time  of 40 minutes. MEDICATIONS: Lidocaine 1% subcutaneous CONTRAST:  50mL OMNIPAQUE IOHEXOL 300 MG/ML SOLN, 75mL OMNIPAQUE IOHEXOL 300 MG/ML SOLN PROCEDURE: The procedure, risks (including but not limited to bleeding, infection, organ damage ), benefits, and alternatives were explained to the patient. Questions regarding the procedure were encouraged and answered. The patient understands and consents to the procedure. Right femoral region prepped and draped in usual sterile fashion. Maximal barrier sterile technique was utilized including caps, mask, sterile gowns, sterile gloves, sterile drape, hand hygiene and skin antiseptic. The right common femoral artery was localized under ultrasound. Under real-time ultrasound guidance, the vessel was accessed with a 21-gauge micropuncture needle, exchanged over a 018 guidewire for a transitional dilator, through which a 035 guidewire was advanced. Over this, a 5 [place] vascular sheath was placed, through which a 5 [place] RIM catheter was advanced and used to selectively catheterize the inferior mesenteric artery for selective arteriography in multiple projections. A coaxial Renegade microcatheter with 014 fathom guidewire was advanced and used to selectively catheterize multiple second third order branches for arteriography in multiple projections. The microcatheter was removed and additional arteriography through the RIM was performed additional projections. The microcatheter was then advanced again for additional arteriography. The microcatheter, catheter and sheath were removed and hemostasis achieved with the aid of the Exoseal device after confirmatory femoral arteriography. The patient tolerated the procedure well. COMPLICATIONS: None immediate FINDINGS: Small focus of active arterial extravasation in the distal sigmoid colon on the initial selective arteriogram. However, this could not be localized on subsequent subselective and oblique arteriography to allow approach  for embolization. No evidence of early draining vein, AVM, mass or other lesion to suggest underlying etiology of the bleeding. Venous phase confirms patency  of the IMV and portal venous system. IMPRESSION: 1. Incomplete localization of distal sigmoid active arterial extravasation. Recommend sigmoidoscopy for further evaluation and possible treatment. Electronically Signed   By: Lucrezia Europe M.D.   On: 06/21/2020 08:38   IR Angiogram Selective Each Additional Vessel  Result Date: 06/21/2020 CLINICAL DATA:  GI bleed, localized to rectosigmoid junction on recent CTA. EXAM: SELECTIVE VISCERAL ARTERIOGRAPHY; ADDITIONAL ARTERIOGRAPHY; IR ULTRASOUND GUIDANCE VASC ACCESS RIGHT ANESTHESIA/SEDATION: Intravenous Fentanyl 30mcg and Versed 1.5mg  were administered as conscious sedation during continuous monitoring of the patient's level of consciousness and physiological / cardiorespiratory status by the radiology RN, with a total moderate sedation time of 40 minutes. MEDICATIONS: Lidocaine 1% subcutaneous CONTRAST:  37mL OMNIPAQUE IOHEXOL 300 MG/ML SOLN, 70mL OMNIPAQUE IOHEXOL 300 MG/ML SOLN PROCEDURE: The procedure, risks (including but not limited to bleeding, infection, organ damage ), benefits, and alternatives were explained to the patient. Questions regarding the procedure were encouraged and answered. The patient understands and consents to the procedure. Right femoral region prepped and draped in usual sterile fashion. Maximal barrier sterile technique was utilized including caps, mask, sterile gowns, sterile gloves, sterile drape, hand hygiene and skin antiseptic. The right common femoral artery was localized under ultrasound. Under real-time ultrasound guidance, the vessel was accessed with a 21-gauge micropuncture needle, exchanged over a 018 guidewire for a transitional dilator, through which a 035 guidewire was advanced. Over this, a 5 [place] vascular sheath was placed, through which a 5 [place] RIM catheter was  advanced and used to selectively catheterize the inferior mesenteric artery for selective arteriography in multiple projections. A coaxial Renegade microcatheter with 014 fathom guidewire was advanced and used to selectively catheterize multiple second third order branches for arteriography in multiple projections. The microcatheter was removed and additional arteriography through the RIM was performed additional projections. The microcatheter was then advanced again for additional arteriography. The microcatheter, catheter and sheath were removed and hemostasis achieved with the aid of the Exoseal device after confirmatory femoral arteriography. The patient tolerated the procedure well. COMPLICATIONS: None immediate FINDINGS: Small focus of active arterial extravasation in the distal sigmoid colon on the initial selective arteriogram. However, this could not be localized on subsequent subselective and oblique arteriography to allow approach for embolization. No evidence of early draining vein, AVM, mass or other lesion to suggest underlying etiology of the bleeding. Venous phase confirms patency of the IMV and portal venous system. IMPRESSION: 1. Incomplete localization of distal sigmoid active arterial extravasation. Recommend sigmoidoscopy for further evaluation and possible treatment. Electronically Signed   By: Lucrezia Europe M.D.   On: 06/21/2020 08:38   IR US Guide Vasc Access Right  Result Date: 06/21/2020 CLINICAL DATA:  GI bleed, localized to rectosigmoid junction on recent CTA. EXAM: SELECTIVE VISCERAL ARTERIOGRAPHY; ADDITIONAL ARTERIOGRAPHY; IR ULTRASOUND GUIDANCE VASC ACCESS RIGHT ANESTHESIA/SEDATION: Intravenous Fentanyl 79mcg and Versed 1.5mg  were administered as conscious sedation during continuous monitoring of the patient's level of consciousness and physiological / cardiorespiratory status by the radiology RN, with a total moderate sedation time of 40 minutes. MEDICATIONS: Lidocaine 1%  subcutaneous CONTRAST:  84mL OMNIPAQUE IOHEXOL 300 MG/ML SOLN, 67mL OMNIPAQUE IOHEXOL 300 MG/ML SOLN PROCEDURE: The procedure, risks (including but not limited to bleeding, infection, organ damage ), benefits, and alternatives were explained to the patient. Questions regarding the procedure were encouraged and answered. The patient understands and consents to the procedure. Right femoral region prepped and draped in usual sterile fashion. Maximal barrier sterile technique was utilized including caps, mask, sterile gowns, sterile gloves,  sterile drape, hand hygiene and skin antiseptic. The right common femoral artery was localized under ultrasound. Under real-time ultrasound guidance, the vessel was accessed with a 21-gauge micropuncture needle, exchanged over a 018 guidewire for a transitional dilator, through which a 035 guidewire was advanced. Over this, a 5 [place] vascular sheath was placed, through which a 5 [place] RIM catheter was advanced and used to selectively catheterize the inferior mesenteric artery for selective arteriography in multiple projections. A coaxial Renegade microcatheter with 014 fathom guidewire was advanced and used to selectively catheterize multiple second third order branches for arteriography in multiple projections. The microcatheter was removed and additional arteriography through the RIM was performed additional projections. The microcatheter was then advanced again for additional arteriography. The microcatheter, catheter and sheath were removed and hemostasis achieved with the aid of the Exoseal device after confirmatory femoral arteriography. The patient tolerated the procedure well. COMPLICATIONS: None immediate FINDINGS: Small focus of active arterial extravasation in the distal sigmoid colon on the initial selective arteriogram. However, this could not be localized on subsequent subselective and oblique arteriography to allow approach for embolization. No evidence of early  draining vein, AVM, mass or other lesion to suggest underlying etiology of the bleeding. Venous phase confirms patency of the IMV and portal venous system. IMPRESSION: 1. Incomplete localization of distal sigmoid active arterial extravasation. Recommend sigmoidoscopy for further evaluation and possible treatment. Electronically Signed   By: Lucrezia Europe M.D.   On: 06/21/2020 08:38   CT Angio Abd/Pel w/ and/or w/o  Result Date: 06/20/2020 CLINICAL DATA:  86 year old with melena. History of prior lower GI bleeding and catheter directed embolization in 2017. EXAM: CTA ABDOMEN AND PELVIS WITHOUT AND WITH CONTRAST TECHNIQUE: Multidetector CT imaging of the abdomen and pelvis was performed using the standard protocol during bolus administration of intravenous contrast. Multiplanar reconstructed images and MIPs were obtained and reviewed to evaluate the vascular anatomy. CONTRAST:  164mL OMNIPAQUE IOHEXOL 350 MG/ML SOLN COMPARISON:  05/11/2016 FINDINGS: VASCULAR Aorta: There is stable penetrating ulcer along the left anterior proximal abdominal aorta at the 1 o'clock position on sequence 7, image 15. This small penetrating ulcer measures roughly 0.6 cm. Infrarenal abdominal aorta is slightly aneurysmal measuring up to 3.0 cm on sequence 7, image 31. There is irregular mural thrombus with ulcerative plaque or penetrating ulcer along the left posterior aspect of the abdominal aorta at the 5 o'clock position. This ulcerative plaque or penetrating ulcer measures roughly 1.0 cm on sequence 10, image image 76. There is more ulceration in this area compared to 2017. No significant aortic stenosis. No evidence for aortic dissection. 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 SMA: Patent without evidence of aneurysm, dissection, vasculitis or significant stenosis. Renals: Both renal arteries are patent without evidence of aneurysm, dissection, vasculitis, fibromuscular dysplasia or significant stenosis. IMA:  Patent without evidence of aneurysm, dissection, vasculitis or significant stenosis. Inflow: Atherosclerotic plaque involving the common, internal and external iliac arteries bilaterally without significant stenosis. No evidence for aneurysm or dissection involving the iliac arteries. Proximal Outflow: Proximal femoral arteries are patent bilaterally. Veins: Iliac veins are patent. IVC and renal veins are patent. Portal venous system is patent. Review of the MIP images confirms the above findings. NON-VASCULAR Lower chest: Lung bases are clear. Hepatobiliary: Normal appearance of the liver, gallbladder and portal venous system. No biliary dilatation. Pancreas: Unremarkable. No pancreatic ductal dilatation or surrounding inflammatory changes. Spleen: Normal in size without focal abnormality. Adrenals/Urinary Tract: Normal appearance of the adrenal glands.  3 mm calcification in the right kidney is suggestive for a nonobstructive stone. Small exophytic cyst in the right kidney lower pole measures 1.0 cm. No hydronephrosis. Cyst involving the left kidney upper pole measures up to 5.9 cm. Probable left parapelvic renal cysts. Small cyst in left kidney lower pole. Normal appearance of the urinary bladder. Stomach/Bowel: Small focus of arterial extravasation in the rectosigmoid colon on sequence 7, image 87. There is additional contrast within the colon on the delayed images on sequence 13, image 52. Findings compatible with an area of active GI bleeding. Extensive diverticulosis involving the sigmoid colon. No other areas are suggestive for active GI bleeding. Normal appearance of the stomach. Lymphatic: No significant lymph node enlargement in the abdomen or pelvis. Reproductive: Prostate is unremarkable. Other: Negative for ascites.  Negative for free air. Musculoskeletal: Disc space narrowing at L5-S1. IMPRESSION: VASCULAR 1. Positive for active GI bleeding. Bleeding is located near the rectosigmoid junction. Etiology  for the bleeding is uncertain but the patient does have significant diverticular disease in this area. 2. 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 Enlarged penetrating ulcer or ulcerative plaque along the left posterior aspect of the infrarenal abdominal aorta measuring up to 1.0 cm. 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 3. Chronic stenosis involving the celiac trunk. NON-VASCULAR 1. Colonic diverticulosis. 2. Bilateral renal cysts. 3. Nonobstructive right kidney stone. These results were called by telephone at the time of interpretation on 06/20/2020 at 4:55 pm to provider Dr. Karle Starch, who verbally acknowledged these results. Electronically Signed   By: Markus Daft M.D.   On: 06/20/2020 17:14    Labs:  CBC: Recent Labs    06/20/20 1020 06/20/20 1600 06/21/20 0326  WBC 8.1 6.0 5.8  HGB 9.6* 8.2* 7.0*  HCT 31.2* 25.3* 22.0*  PLT 125* 104* 93*    COAGS: No results for input(s): INR, APTT in the last 8760 hours.  BMP: Recent Labs    06/20/20 1020 06/20/20 1600 06/21/20 0326  NA 139 136 137  K 4.5 4.1 3.7  CL 105 106 107  CO2 24 22 21*  GLUCOSE 114* 86 88  BUN 25* 18 17  CALCIUM 9.1 7.9* 8.2*  CREATININE 1.10 0.90 0.89  GFRNONAA >60 >60 >60    LIVER FUNCTION TESTS: Recent Labs    06/20/20 1020 06/20/20 1600  BILITOT 0.6 0.4  AST 31 26  ALT 17 15  ALKPHOS 75 60  PROT 6.7 5.6*  ALBUMIN 4.0 3.4*    Assessment and Plan:  86 y.o. male inpatient. History of HTN, DM, GI bleed s/p embolization on 10.31.1,, CAD s/p stent placement. Presented to the ED at [facility] with dark tarry stools X 2 days that progressed to frank red blood. IR performed an arteriogram on 12.9.21 with active extravasation noted.  Hgb at 03:26 was 7 (down from 8.2 at 12.9.21 @ 16:00) Patient denies any overt bleeding and states that he feels better. Patient has not had a bowel movement today.   Patient to stable from IR  perscpective s/p arteriogram further plans per primary Team please call IR with questions or concerns.    Electronically Signed: Jacqualine Mau, NP 06/21/2020, 12:26 PM   I spent a total of 15 Minutes at the patient's bedside AND on the patient's hospital floor or unit, greater than 50% of which was counseling/coordinating care for arteriogram.

## 2020-06-21 NOTE — Progress Notes (Addendum)
Daily Rounding Note  06/21/2020, 10:15 AM  LOS: 0 days   SUBJECTIVE:   Chief complaint:  Lower GI bleed.     Last BM was yest evening.  Still hematochezia then.  No blood or stool since.    OBJECTIVE:         Vital signs in last 24 hours:    Temp:  [97.8 F (36.6 C)-98.4 F (36.9 C)] 97.8 F (36.6 C) (12/10 0900) Pulse Rate:  [71-98] 71 (12/10 0339) Resp:  [10-20] 18 (12/10 0339) BP: (113-167)/(56-93) 132/67 (12/10 0900) SpO2:  [96 %-100 %] 99 % (12/10 0339) Weight:  [49.4 kg] 49.4 kg (12/09 2313) Last BM Date: 06/20/20 Filed Weights   06/20/20 1009 06/20/20 2313  Weight: 47.6 kg 49.4 kg   General: comfortable.  Aged.   alert   Heart: RRR Chest: clear bil Abdomen: soft, NT, ND.  Active BS  Extremities: no CCE Neuro/Psych:  Oriented x 3.  Alert.    Intake/Output from previous day: 12/09 0701 - 12/10 0700 In: 386.1 [P.O.:120; I.V.:266.1] Out: -   Intake/Output this shift: No intake/output data recorded.  Lab Results: Recent Labs    06/20/20 1020 06/20/20 1600 06/21/20 0326  WBC 8.1 6.0 5.8  HGB 9.6* 8.2* 7.0*  HCT 31.2* 25.3* 22.0*  PLT 125* 104* 93*   BMET Recent Labs    06/20/20 1020 06/20/20 1600 06/21/20 0326  NA 139 136 137  K 4.5 4.1 3.7  CL 105 106 107  CO2 24 22 21*  GLUCOSE 114* 86 88  BUN 25* 18 17  CREATININE 1.10 0.90 0.89  CALCIUM 9.1 7.9* 8.2*   LFT Recent Labs    06/20/20 1020 06/20/20 1600  PROT 6.7 5.6*  ALBUMIN 4.0 3.4*  AST 31 26  ALT 17 15  ALKPHOS 75 60  BILITOT 0.6 0.4    Studies/Results: IR Angiogram Visceral Selective  Result Date: 06/21/2020 CLINICAL DATA:  GI bleed, localized to rectosigmoid junction on recent CTA. EXAM: SELECTIVE VISCERAL ARTERIOGRAPHY; ADDITIONAL ARTERIOGRAPHY; IR ULTRASOUND GUIDANCE VASC ACCESS RIGHT ANESTHESIA/SEDATION: Intravenous Fentanyl 44mcg and Versed 1.5mg  were administered as conscious sedation during continuous  monitoring of the patient's level of consciousness and physiological / cardiorespiratory status by the radiology RN, with a total moderate sedation time of 40 minutes. MEDICATIONS: Lidocaine 1% subcutaneous CONTRAST:  56mL OMNIPAQUE IOHEXOL 300 MG/ML SOLN, 70mL OMNIPAQUE IOHEXOL 300 MG/ML SOLN PROCEDURE: The procedure, risks (including but not limited to bleeding, infection, organ damage ), benefits, and alternatives were explained to the patient. Questions regarding the procedure were encouraged and answered. The patient understands and consents to the procedure. Right femoral region prepped and draped in usual sterile fashion. Maximal barrier sterile technique was utilized including caps, mask, sterile gowns, sterile gloves, sterile drape, hand hygiene and skin antiseptic. The right common femoral artery was localized under ultrasound. Under real-time ultrasound guidance, the vessel was accessed with a 21-gauge micropuncture needle, exchanged over a 018 guidewire for a transitional dilator, through which a 035 guidewire was advanced. Over this, a 5 [place] vascular sheath was placed, through which a 5 [place] RIM catheter was advanced and used to selectively catheterize the inferior mesenteric artery for selective arteriography in multiple projections. A coaxial Renegade microcatheter with 014 fathom guidewire was advanced and used to selectively catheterize multiple second third order branches for arteriography in multiple projections. The microcatheter was removed and additional arteriography through the RIM was performed additional projections. The microcatheter was then advanced  again for additional arteriography. The microcatheter, catheter and sheath were removed and hemostasis achieved with the aid of the Exoseal device after confirmatory femoral arteriography. The patient tolerated the procedure well. COMPLICATIONS: None immediate FINDINGS: Small focus of active arterial extravasation in the distal sigmoid  colon on the initial selective arteriogram. However, this could not be localized on subsequent subselective and oblique arteriography to allow approach for embolization. No evidence of early draining vein, AVM, mass or other lesion to suggest underlying etiology of the bleeding. Venous phase confirms patency of the IMV and portal venous system. IMPRESSION: 1. Incomplete localization of distal sigmoid active arterial extravasation. Recommend sigmoidoscopy for further evaluation and possible treatment. Electronically Signed   By: Lucrezia Europe M.D.   On: 06/21/2020 08:38   IR Angiogram Visceral Selective  Result Date: 06/21/2020 CLINICAL DATA:  GI bleed, localized to rectosigmoid junction on recent CTA. EXAM: SELECTIVE VISCERAL ARTERIOGRAPHY; ADDITIONAL ARTERIOGRAPHY; IR ULTRASOUND GUIDANCE VASC ACCESS RIGHT ANESTHESIA/SEDATION: Intravenous Fentanyl 21mcg and Versed 1.5mg  were administered as conscious sedation during continuous monitoring of the patient's level of consciousness and physiological / cardiorespiratory status by the radiology RN, with a total moderate sedation time of 40 minutes. MEDICATIONS: Lidocaine 1% subcutaneous CONTRAST:  52mL OMNIPAQUE IOHEXOL 300 MG/ML SOLN, 61mL OMNIPAQUE IOHEXOL 300 MG/ML SOLN PROCEDURE: The procedure, risks (including but not limited to bleeding, infection, organ damage ), benefits, and alternatives were explained to the patient. Questions regarding the procedure were encouraged and answered. The patient understands and consents to the procedure. Right femoral region prepped and draped in usual sterile fashion. Maximal barrier sterile technique was utilized including caps, mask, sterile gowns, sterile gloves, sterile drape, hand hygiene and skin antiseptic. The right common femoral artery was localized under ultrasound. Under real-time ultrasound guidance, the vessel was accessed with a 21-gauge micropuncture needle, exchanged over a 018 guidewire for a transitional  dilator, through which a 035 guidewire was advanced. Over this, a 5 [place] vascular sheath was placed, through which a 5 [place] RIM catheter was advanced and used to selectively catheterize the inferior mesenteric artery for selective arteriography in multiple projections. A coaxial Renegade microcatheter with 014 fathom guidewire was advanced and used to selectively catheterize multiple second third order branches for arteriography in multiple projections. The microcatheter was removed and additional arteriography through the RIM was performed additional projections. The microcatheter was then advanced again for additional arteriography. The microcatheter, catheter and sheath were removed and hemostasis achieved with the aid of the Exoseal device after confirmatory femoral arteriography. The patient tolerated the procedure well. COMPLICATIONS: None immediate FINDINGS: Small focus of active arterial extravasation in the distal sigmoid colon on the initial selective arteriogram. However, this could not be localized on subsequent subselective and oblique arteriography to allow approach for embolization. No evidence of early draining vein, AVM, mass or other lesion to suggest underlying etiology of the bleeding. Venous phase confirms patency of the IMV and portal venous system. IMPRESSION: 1. Incomplete localization of distal sigmoid active arterial extravasation. Recommend sigmoidoscopy for further evaluation and possible treatment. Electronically Signed   By: Lucrezia Europe M.D.   On: 06/21/2020 08:38   IR Angiogram Selective Each Additional Vessel  Result Date: 06/21/2020 CLINICAL DATA:  GI bleed, localized to rectosigmoid junction on recent CTA. EXAM: SELECTIVE VISCERAL ARTERIOGRAPHY; ADDITIONAL ARTERIOGRAPHY; IR ULTRASOUND GUIDANCE VASC ACCESS RIGHT ANESTHESIA/SEDATION: Intravenous Fentanyl 24mcg and Versed 1.5mg  were administered as conscious sedation during continuous monitoring of the patient's level of  consciousness and physiological / cardiorespiratory status by the radiology  RN, with a total moderate sedation time of 40 minutes. MEDICATIONS: Lidocaine 1% subcutaneous CONTRAST:  76mL OMNIPAQUE IOHEXOL 300 MG/ML SOLN, 22mL OMNIPAQUE IOHEXOL 300 MG/ML SOLN PROCEDURE: The procedure, risks (including but not limited to bleeding, infection, organ damage ), benefits, and alternatives were explained to the patient. Questions regarding the procedure were encouraged and answered. The patient understands and consents to the procedure. Right femoral region prepped and draped in usual sterile fashion. Maximal barrier sterile technique was utilized including caps, mask, sterile gowns, sterile gloves, sterile drape, hand hygiene and skin antiseptic. The right common femoral artery was localized under ultrasound. Under real-time ultrasound guidance, the vessel was accessed with a 21-gauge micropuncture needle, exchanged over a 018 guidewire for a transitional dilator, through which a 035 guidewire was advanced. Over this, a 5 [place] vascular sheath was placed, through which a 5 [place] RIM catheter was advanced and used to selectively catheterize the inferior mesenteric artery for selective arteriography in multiple projections. A coaxial Renegade microcatheter with 014 fathom guidewire was advanced and used to selectively catheterize multiple second third order branches for arteriography in multiple projections. The microcatheter was removed and additional arteriography through the RIM was performed additional projections. The microcatheter was then advanced again for additional arteriography. The microcatheter, catheter and sheath were removed and hemostasis achieved with the aid of the Exoseal device after confirmatory femoral arteriography. The patient tolerated the procedure well. COMPLICATIONS: None immediate FINDINGS: Small focus of active arterial extravasation in the distal sigmoid colon on the initial selective  arteriogram. However, this could not be localized on subsequent subselective and oblique arteriography to allow approach for embolization. No evidence of early draining vein, AVM, mass or other lesion to suggest underlying etiology of the bleeding. Venous phase confirms patency of the IMV and portal venous system. IMPRESSION: 1. Incomplete localization of distal sigmoid active arterial extravasation. Recommend sigmoidoscopy for further evaluation and possible treatment. Electronically Signed   By: Lucrezia Europe M.D.   On: 06/21/2020 08:38   IR US Guide Vasc Access Right  Result Date: 06/21/2020 CLINICAL DATA:  GI bleed, localized to rectosigmoid junction on recent CTA. EXAM: SELECTIVE VISCERAL ARTERIOGRAPHY; ADDITIONAL ARTERIOGRAPHY; IR ULTRASOUND GUIDANCE VASC ACCESS RIGHT ANESTHESIA/SEDATION: Intravenous Fentanyl 63mcg and Versed 1.5mg  were administered as conscious sedation during continuous monitoring of the patient's level of consciousness and physiological / cardiorespiratory status by the radiology RN, with a total moderate sedation time of 40 minutes. MEDICATIONS: Lidocaine 1% subcutaneous CONTRAST:  33mL OMNIPAQUE IOHEXOL 300 MG/ML SOLN, 31mL OMNIPAQUE IOHEXOL 300 MG/ML SOLN PROCEDURE: The procedure, risks (including but not limited to bleeding, infection, organ damage ), benefits, and alternatives were explained to the patient. Questions regarding the procedure were encouraged and answered. The patient understands and consents to the procedure. Right femoral region prepped and draped in usual sterile fashion. Maximal barrier sterile technique was utilized including caps, mask, sterile gowns, sterile gloves, sterile drape, hand hygiene and skin antiseptic. The right common femoral artery was localized under ultrasound. Under real-time ultrasound guidance, the vessel was accessed with a 21-gauge micropuncture needle, exchanged over a 018 guidewire for a transitional dilator, through which a 035 guidewire  was advanced. Over this, a 5 [place] vascular sheath was placed, through which a 5 [place] RIM catheter was advanced and used to selectively catheterize the inferior mesenteric artery for selective arteriography in multiple projections. A coaxial Renegade microcatheter with 014 fathom guidewire was advanced and used to selectively catheterize multiple second third order branches for arteriography in multiple projections. The  microcatheter was removed and additional arteriography through the RIM was performed additional projections. The microcatheter was then advanced again for additional arteriography. The microcatheter, catheter and sheath were removed and hemostasis achieved with the aid of the Exoseal device after confirmatory femoral arteriography. The patient tolerated the procedure well. COMPLICATIONS: None immediate FINDINGS: Small focus of active arterial extravasation in the distal sigmoid colon on the initial selective arteriogram. However, this could not be localized on subsequent subselective and oblique arteriography to allow approach for embolization. No evidence of early draining vein, AVM, mass or other lesion to suggest underlying etiology of the bleeding. Venous phase confirms patency of the IMV and portal venous system. IMPRESSION: 1. Incomplete localization of distal sigmoid active arterial extravasation. Recommend sigmoidoscopy for further evaluation and possible treatment. Electronically Signed   By: Lucrezia Europe M.D.   On: 06/21/2020 08:38   CT Angio Abd/Pel w/ and/or w/o  Result Date: 06/20/2020 CLINICAL DATA:  86 year old with melena. History of prior lower GI bleeding and catheter directed embolization in 2017. EXAM: CTA ABDOMEN AND PELVIS WITHOUT AND WITH CONTRAST TECHNIQUE: Multidetector CT imaging of the abdomen and pelvis was performed using the standard protocol during bolus administration of intravenous contrast. Multiplanar reconstructed images and MIPs were obtained and reviewed to  evaluate the vascular anatomy. 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 This small penetrating ulcer measures roughly 0.6 cm. Infrarenal abdominal aorta is slightly aneurysmal measuring up to 3.0 cm on sequence 7, image 31. There is irregular mural thrombus with ulcerative plaque or penetrating ulcer along the left posterior aspect of the abdominal aorta at the 5 o'clock position. This ulcerative plaque or penetrating ulcer measures roughly 1.0 cm on sequence 10, image image 76. There is more ulceration in this area compared to 2017. No significant aortic stenosis. No evidence for aortic dissection. 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 SMA: Patent without evidence of aneurysm, dissection, vasculitis or significant stenosis. Renals: Both renal arteries are patent without evidence of aneurysm, dissection, vasculitis, fibromuscular dysplasia or significant stenosis. IMA: Patent without evidence of aneurysm, dissection, vasculitis or significant stenosis. Inflow: Atherosclerotic plaque involving the common, internal and external iliac arteries bilaterally without significant stenosis. No evidence for aneurysm or dissection involving the iliac arteries. Proximal Outflow: Proximal femoral arteries are patent bilaterally. Veins: Iliac veins are patent. IVC and renal veins are patent. Portal venous system is patent. Review of the MIP images confirms the above findings. NON-VASCULAR Lower chest: Lung bases are clear. Hepatobiliary: Normal appearance of the liver, gallbladder and portal venous system. No biliary dilatation. Pancreas: Unremarkable. No pancreatic ductal dilatation or surrounding inflammatory changes. Spleen: Normal in size without focal abnormality. Adrenals/Urinary Tract: Normal appearance of the  adrenal glands. 3 mm calcification in the right kidney is suggestive for a nonobstructive stone. Small exophytic cyst in the right kidney lower pole measures 1.0 cm. No hydronephrosis. Cyst involving the left kidney upper pole measures up to 5.9 cm. Probable left parapelvic renal cysts. Small cyst in left kidney lower pole. Normal appearance of the urinary bladder. Stomach/Bowel: Small focus of arterial extravasation in the rectosigmoid colon on sequence 7, image 87. There is additional contrast within the colon on the delayed images on sequence 13, image 52. Findings compatible with an area of active GI bleeding. Extensive diverticulosis involving the sigmoid colon. No  other areas are suggestive for active GI bleeding. Normal appearance of the stomach. Lymphatic: No significant lymph node enlargement in the abdomen or pelvis. Reproductive: Prostate is unremarkable. Other: Negative for ascites.  Negative for free air. Musculoskeletal: Disc space narrowing at L5-S1. IMPRESSION: VASCULAR 1. Positive for active GI bleeding. Bleeding is located near the rectosigmoid junction. Etiology for the bleeding is uncertain but the patient does have significant diverticular disease in this area. 2. 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 Enlarged penetrating ulcer or ulcerative plaque along the left posterior aspect of the infrarenal abdominal aorta measuring up to 1.0 cm. 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 3. Chronic stenosis involving the celiac trunk. NON-VASCULAR 1. Colonic diverticulosis. 2. Bilateral renal cysts. 3. Nonobstructive right kidney stone. These results were called by telephone at the time of interpretation on 06/20/2020 at 4:55 pm to provider Dr. Karle Starch, who verbally acknowledged these results. Electronically Signed   By: Markus Daft M.D.   On: 06/20/2020 17:14    ASSESMENT:   *  Lower GIB, painless hematochezia.   CTAP positive for active bleeding at rectosigmoid junction.  Selective mesenteric arteriogram with incomplete localization of active distal sigmoid bleed.  IR recommended sigmoidoscopy.  *    Normocytic anemia.  Hgb 9.6 >> 7.  1 PRBC ordered as of 0550 this morning  *     Noncritical thrombocytopenia, chronic.  Platelets 125 >> 93. Hx low-grade B-cell lymphoproliferative disorder.   PLAN   *     Flexible sigmoidoscopy this afternoon.  Tap water enema.  No sedation as he ate soft diet this AM.     Dustin Howell  06/21/2020, 10:15 AM Phone [phone]    Attending Physician Note   I have taken an interval history, reviewed the chart and examined the patient. I agree with the Advanced Practitioner's note, impression and recommendations.   CTA positive near the rectosigmoid junction. Mesenteric angiogram with incomplete localization of site and embolization was not able to be performed. Pt is agreeable to flex sigmoidoscopy this afternoon to further evaluate.    Lucio Edward, MD [facility]

## 2020-06-21 NOTE — H&P (View-Only) (Signed)
Daily Rounding Note  06/21/2020, 10:15 AM  LOS: 0 days   SUBJECTIVE:   Chief complaint:  Lower GI bleed.     Last BM was yest evening.  Still hematochezia then.  No blood or stool since.    OBJECTIVE:         Vital signs in last 24 hours:    Temp:  [97.8 F (36.6 C)-98.4 F (36.9 C)] 97.8 F (36.6 C) (12/10 0900) Pulse Rate:  [71-98] 71 (12/10 0339) Resp:  [10-20] 18 (12/10 0339) BP: (113-167)/(56-93) 132/67 (12/10 0900) SpO2:  [96 %-100 %] 99 % (12/10 0339) Weight:  [49.4 kg] 49.4 kg (12/09 2313) Last BM Date: 06/20/20 Filed Weights   06/20/20 1009 06/20/20 2313  Weight: 47.6 kg 49.4 kg   General: comfortable.  Aged.   alert   Heart: RRR Chest: clear bil Abdomen: soft, NT, ND.  Active BS  Extremities: no CCE Neuro/Psych:  Oriented x 3.  Alert.    Intake/Output from previous day: 12/09 0701 - 12/10 0700 In: 386.1 [P.O.:120; I.V.:266.1] Out: -   Intake/Output this shift: No intake/output data recorded.  Lab Results: Recent Labs    06/20/20 1020 06/20/20 1600 06/21/20 0326  WBC 8.1 6.0 5.8  HGB 9.6* 8.2* 7.0*  HCT 31.2* 25.3* 22.0*  PLT 125* 104* 93*   BMET Recent Labs    06/20/20 1020 06/20/20 1600 06/21/20 0326  NA 139 136 137  K 4.5 4.1 3.7  CL 105 106 107  CO2 24 22 21*  GLUCOSE 114* 86 88  BUN 25* 18 17  CREATININE 1.10 0.90 0.89  CALCIUM 9.1 7.9* 8.2*   LFT Recent Labs    06/20/20 1020 06/20/20 1600  PROT 6.7 5.6*  ALBUMIN 4.0 3.4*  AST 31 26  ALT 17 15  ALKPHOS 75 60  BILITOT 0.6 0.4    Studies/Results: IR Angiogram Visceral Selective  Result Date: 06/21/2020 CLINICAL DATA:  GI bleed, localized to rectosigmoid junction on recent CTA. EXAM: SELECTIVE VISCERAL ARTERIOGRAPHY; ADDITIONAL ARTERIOGRAPHY; IR ULTRASOUND GUIDANCE VASC ACCESS RIGHT ANESTHESIA/SEDATION: Intravenous Fentanyl 5mcg and Versed 1.5mg  were administered as conscious sedation during continuous  monitoring of the patient's level of consciousness and physiological / cardiorespiratory status by the radiology RN, with a total moderate sedation time of 40 minutes. MEDICATIONS: Lidocaine 1% subcutaneous CONTRAST:  25mL OMNIPAQUE IOHEXOL 300 MG/ML SOLN, 19mL OMNIPAQUE IOHEXOL 300 MG/ML SOLN PROCEDURE: The procedure, risks (including but not limited to bleeding, infection, organ damage ), benefits, and alternatives were explained to the patient. Questions regarding the procedure were encouraged and answered. The patient understands and consents to the procedure. Right femoral region prepped and draped in usual sterile fashion. Maximal barrier sterile technique was utilized including caps, mask, sterile gowns, sterile gloves, sterile drape, hand hygiene and skin antiseptic. The right common femoral artery was localized under ultrasound. Under real-time ultrasound guidance, the vessel was accessed with a 21-gauge micropuncture needle, exchanged over a 018 guidewire for a transitional dilator, through which a 035 guidewire was advanced. Over this, a 5 [place] vascular sheath was placed, through which a 5 [place] RIM catheter was advanced and used to selectively catheterize the inferior mesenteric artery for selective arteriography in multiple projections. A coaxial Renegade microcatheter with 014 fathom guidewire was advanced and used to selectively catheterize multiple second third order branches for arteriography in multiple projections. The microcatheter was removed and additional arteriography through the RIM was performed additional projections. The microcatheter was then advanced  again for additional arteriography. The microcatheter, catheter and sheath were removed and hemostasis achieved with the aid of the Exoseal device after confirmatory femoral arteriography. The patient tolerated the procedure well. COMPLICATIONS: None immediate FINDINGS: Small focus of active arterial extravasation in the distal sigmoid  colon on the initial selective arteriogram. However, this could not be localized on subsequent subselective and oblique arteriography to allow approach for embolization. No evidence of early draining vein, AVM, mass or other lesion to suggest underlying etiology of the bleeding. Venous phase confirms patency of the IMV and portal venous system. IMPRESSION: 1. Incomplete localization of distal sigmoid active arterial extravasation. Recommend sigmoidoscopy for further evaluation and possible treatment. Electronically Signed   By: Lucrezia Europe M.D.   On: 06/21/2020 08:38   IR Angiogram Visceral Selective  Result Date: 06/21/2020 CLINICAL DATA:  GI bleed, localized to rectosigmoid junction on recent CTA. EXAM: SELECTIVE VISCERAL ARTERIOGRAPHY; ADDITIONAL ARTERIOGRAPHY; IR ULTRASOUND GUIDANCE VASC ACCESS RIGHT ANESTHESIA/SEDATION: Intravenous Fentanyl 33mcg and Versed 1.5mg  were administered as conscious sedation during continuous monitoring of the patient's level of consciousness and physiological / cardiorespiratory status by the radiology RN, with a total moderate sedation time of 40 minutes. MEDICATIONS: Lidocaine 1% subcutaneous CONTRAST:  73mL OMNIPAQUE IOHEXOL 300 MG/ML SOLN, 103mL OMNIPAQUE IOHEXOL 300 MG/ML SOLN PROCEDURE: The procedure, risks (including but not limited to bleeding, infection, organ damage ), benefits, and alternatives were explained to the patient. Questions regarding the procedure were encouraged and answered. The patient understands and consents to the procedure. Right femoral region prepped and draped in usual sterile fashion. Maximal barrier sterile technique was utilized including caps, mask, sterile gowns, sterile gloves, sterile drape, hand hygiene and skin antiseptic. The right common femoral artery was localized under ultrasound. Under real-time ultrasound guidance, the vessel was accessed with a 21-gauge micropuncture needle, exchanged over a 018 guidewire for a transitional  dilator, through which a 035 guidewire was advanced. Over this, a 5 [place] vascular sheath was placed, through which a 5 [place] RIM catheter was advanced and used to selectively catheterize the inferior mesenteric artery for selective arteriography in multiple projections. A coaxial Renegade microcatheter with 014 fathom guidewire was advanced and used to selectively catheterize multiple second third order branches for arteriography in multiple projections. The microcatheter was removed and additional arteriography through the RIM was performed additional projections. The microcatheter was then advanced again for additional arteriography. The microcatheter, catheter and sheath were removed and hemostasis achieved with the aid of the Exoseal device after confirmatory femoral arteriography. The patient tolerated the procedure well. COMPLICATIONS: None immediate FINDINGS: Small focus of active arterial extravasation in the distal sigmoid colon on the initial selective arteriogram. However, this could not be localized on subsequent subselective and oblique arteriography to allow approach for embolization. No evidence of early draining vein, AVM, mass or other lesion to suggest underlying etiology of the bleeding. Venous phase confirms patency of the IMV and portal venous system. IMPRESSION: 1. Incomplete localization of distal sigmoid active arterial extravasation. Recommend sigmoidoscopy for further evaluation and possible treatment. Electronically Signed   By: Lucrezia Europe M.D.   On: 06/21/2020 08:38   IR Angiogram Selective Each Additional Vessel  Result Date: 06/21/2020 CLINICAL DATA:  GI bleed, localized to rectosigmoid junction on recent CTA. EXAM: SELECTIVE VISCERAL ARTERIOGRAPHY; ADDITIONAL ARTERIOGRAPHY; IR ULTRASOUND GUIDANCE VASC ACCESS RIGHT ANESTHESIA/SEDATION: Intravenous Fentanyl 73mcg and Versed 1.5mg  were administered as conscious sedation during continuous monitoring of the patient's level of  consciousness and physiological / cardiorespiratory status by the radiology  RN, with a total moderate sedation time of 40 minutes. MEDICATIONS: Lidocaine 1% subcutaneous CONTRAST:  86mL OMNIPAQUE IOHEXOL 300 MG/ML SOLN, 43mL OMNIPAQUE IOHEXOL 300 MG/ML SOLN PROCEDURE: The procedure, risks (including but not limited to bleeding, infection, organ damage ), benefits, and alternatives were explained to the patient. Questions regarding the procedure were encouraged and answered. The patient understands and consents to the procedure. Right femoral region prepped and draped in usual sterile fashion. Maximal barrier sterile technique was utilized including caps, mask, sterile gowns, sterile gloves, sterile drape, hand hygiene and skin antiseptic. The right common femoral artery was localized under ultrasound. Under real-time ultrasound guidance, the vessel was accessed with a 21-gauge micropuncture needle, exchanged over a 018 guidewire for a transitional dilator, through which a 035 guidewire was advanced. Over this, a 5 [place] vascular sheath was placed, through which a 5 [place] RIM catheter was advanced and used to selectively catheterize the inferior mesenteric artery for selective arteriography in multiple projections. A coaxial Renegade microcatheter with 014 fathom guidewire was advanced and used to selectively catheterize multiple second third order branches for arteriography in multiple projections. The microcatheter was removed and additional arteriography through the RIM was performed additional projections. The microcatheter was then advanced again for additional arteriography. The microcatheter, catheter and sheath were removed and hemostasis achieved with the aid of the Exoseal device after confirmatory femoral arteriography. The patient tolerated the procedure well. COMPLICATIONS: None immediate FINDINGS: Small focus of active arterial extravasation in the distal sigmoid colon on the initial selective  arteriogram. However, this could not be localized on subsequent subselective and oblique arteriography to allow approach for embolization. No evidence of early draining vein, AVM, mass or other lesion to suggest underlying etiology of the bleeding. Venous phase confirms patency of the IMV and portal venous system. IMPRESSION: 1. Incomplete localization of distal sigmoid active arterial extravasation. Recommend sigmoidoscopy for further evaluation and possible treatment. Electronically Signed   By: Lucrezia Europe M.D.   On: 06/21/2020 08:38   IR US Guide Vasc Access Right  Result Date: 06/21/2020 CLINICAL DATA:  GI bleed, localized to rectosigmoid junction on recent CTA. EXAM: SELECTIVE VISCERAL ARTERIOGRAPHY; ADDITIONAL ARTERIOGRAPHY; IR ULTRASOUND GUIDANCE VASC ACCESS RIGHT ANESTHESIA/SEDATION: Intravenous Fentanyl 34mcg and Versed 1.5mg  were administered as conscious sedation during continuous monitoring of the patient's level of consciousness and physiological / cardiorespiratory status by the radiology RN, with a total moderate sedation time of 40 minutes. MEDICATIONS: Lidocaine 1% subcutaneous CONTRAST:  42mL OMNIPAQUE IOHEXOL 300 MG/ML SOLN, 71mL OMNIPAQUE IOHEXOL 300 MG/ML SOLN PROCEDURE: The procedure, risks (including but not limited to bleeding, infection, organ damage ), benefits, and alternatives were explained to the patient. Questions regarding the procedure were encouraged and answered. The patient understands and consents to the procedure. Right femoral region prepped and draped in usual sterile fashion. Maximal barrier sterile technique was utilized including caps, mask, sterile gowns, sterile gloves, sterile drape, hand hygiene and skin antiseptic. The right common femoral artery was localized under ultrasound. Under real-time ultrasound guidance, the vessel was accessed with a 21-gauge micropuncture needle, exchanged over a 018 guidewire for a transitional dilator, through which a 035 guidewire  was advanced. Over this, a 5 [place] vascular sheath was placed, through which a 5 [place] RIM catheter was advanced and used to selectively catheterize the inferior mesenteric artery for selective arteriography in multiple projections. A coaxial Renegade microcatheter with 014 fathom guidewire was advanced and used to selectively catheterize multiple second third order branches for arteriography in multiple projections. The  microcatheter was removed and additional arteriography through the RIM was performed additional projections. The microcatheter was then advanced again for additional arteriography. The microcatheter, catheter and sheath were removed and hemostasis achieved with the aid of the Exoseal device after confirmatory femoral arteriography. The patient tolerated the procedure well. COMPLICATIONS: None immediate FINDINGS: Small focus of active arterial extravasation in the distal sigmoid colon on the initial selective arteriogram. However, this could not be localized on subsequent subselective and oblique arteriography to allow approach for embolization. No evidence of early draining vein, AVM, mass or other lesion to suggest underlying etiology of the bleeding. Venous phase confirms patency of the IMV and portal venous system. IMPRESSION: 1. Incomplete localization of distal sigmoid active arterial extravasation. Recommend sigmoidoscopy for further evaluation and possible treatment. Electronically Signed   By: Lucrezia Europe M.D.   On: 06/21/2020 08:38   CT Angio Abd/Pel w/ and/or w/o  Result Date: 06/20/2020 CLINICAL DATA:  86 year old with melena. History of prior lower GI bleeding and catheter directed embolization in 2017. EXAM: CTA ABDOMEN AND PELVIS WITHOUT AND WITH CONTRAST TECHNIQUE: Multidetector CT imaging of the abdomen and pelvis was performed using the standard protocol during bolus administration of intravenous contrast. Multiplanar reconstructed images and MIPs were obtained and reviewed to  evaluate the vascular anatomy. CONTRAST:  131mL OMNIPAQUE IOHEXOL 350 MG/ML SOLN COMPARISON:  05/11/2016 FINDINGS: VASCULAR Aorta: There is stable penetrating ulcer along the left anterior proximal abdominal aorta at the 1 o'clock position on sequence 7, image 15. This small penetrating ulcer measures roughly 0.6 cm. Infrarenal abdominal aorta is slightly aneurysmal measuring up to 3.0 cm on sequence 7, image 31. There is irregular mural thrombus with ulcerative plaque or penetrating ulcer along the left posterior aspect of the abdominal aorta at the 5 o'clock position. This ulcerative plaque or penetrating ulcer measures roughly 1.0 cm on sequence 10, image image 76. There is more ulceration in this area compared to 2017. No significant aortic stenosis. No evidence for aortic dissection. 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 SMA: Patent without evidence of aneurysm, dissection, vasculitis or significant stenosis. Renals: Both renal arteries are patent without evidence of aneurysm, dissection, vasculitis, fibromuscular dysplasia or significant stenosis. IMA: Patent without evidence of aneurysm, dissection, vasculitis or significant stenosis. Inflow: Atherosclerotic plaque involving the common, internal and external iliac arteries bilaterally without significant stenosis. No evidence for aneurysm or dissection involving the iliac arteries. Proximal Outflow: Proximal femoral arteries are patent bilaterally. Veins: Iliac veins are patent. IVC and renal veins are patent. Portal venous system is patent. Review of the MIP images confirms the above findings. NON-VASCULAR Lower chest: Lung bases are clear. Hepatobiliary: Normal appearance of the liver, gallbladder and portal venous system. No biliary dilatation. Pancreas: Unremarkable. No pancreatic ductal dilatation or surrounding inflammatory changes. Spleen: Normal in size without focal abnormality. Adrenals/Urinary Tract: Normal appearance of the  adrenal glands. 3 mm calcification in the right kidney is suggestive for a nonobstructive stone. Small exophytic cyst in the right kidney lower pole measures 1.0 cm. No hydronephrosis. Cyst involving the left kidney upper pole measures up to 5.9 cm. Probable left parapelvic renal cysts. Small cyst in left kidney lower pole. Normal appearance of the urinary bladder. Stomach/Bowel: Small focus of arterial extravasation in the rectosigmoid colon on sequence 7, image 87. There is additional contrast within the colon on the delayed images on sequence 13, image 52. Findings compatible with an area of active GI bleeding. Extensive diverticulosis involving the sigmoid colon. No  other areas are suggestive for active GI bleeding. Normal appearance of the stomach. Lymphatic: No significant lymph node enlargement in the abdomen or pelvis. Reproductive: Prostate is unremarkable. Other: Negative for ascites.  Negative for free air. Musculoskeletal: Disc space narrowing at L5-S1. IMPRESSION: VASCULAR 1. Positive for active GI bleeding. Bleeding is located near the rectosigmoid junction. Etiology for the bleeding is uncertain but the patient does have significant diverticular disease in this area. 2. 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 Enlarged penetrating ulcer or ulcerative plaque along the left posterior aspect of the infrarenal abdominal aorta measuring up to 1.0 cm. 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 3. Chronic stenosis involving the celiac trunk. NON-VASCULAR 1. Colonic diverticulosis. 2. Bilateral renal cysts. 3. Nonobstructive right kidney stone. These results were called by telephone at the time of interpretation on 06/20/2020 at 4:55 pm to provider Dr. Karle Starch, who verbally acknowledged these results. Electronically Signed   By: Markus Daft M.D.   On: 06/20/2020 17:14    ASSESMENT:   *  Lower GIB, painless hematochezia.   CTAP positive for active bleeding at rectosigmoid junction.  Selective mesenteric arteriogram with incomplete localization of active distal sigmoid bleed.  IR recommended sigmoidoscopy.  *    Normocytic anemia.  Hgb 9.6 >> 7.  1 PRBC ordered as of 0550 this morning  *     Noncritical thrombocytopenia, chronic.  Platelets 125 >> 93. Hx low-grade B-cell lymphoproliferative disorder.   PLAN   *     Flexible sigmoidoscopy this afternoon.  Tap water enema.  No sedation as he ate soft diet this AM.     [name]  06/21/2020, 10:15 AM Phone [phone]    Attending Physician Note   I have taken an interval history, reviewed the chart and examined the patient. I agree with the Advanced Practitioner's note, impression and recommendations.   CTA positive near the rectosigmoid junction. Mesenteric angiogram with incomplete localization of site and embolization was not able to be performed. Pt is agreeable to flex sigmoidoscopy this afternoon to further evaluate.    Lucio Edward, MD [facility]

## 2020-06-22 DIAGNOSIS — R933 Abnormal findings on diagnostic imaging of other parts of digestive tract: Secondary | ICD-10-CM

## 2020-06-22 DIAGNOSIS — K5731 Diverticulosis of large intestine without perforation or abscess with bleeding: Secondary | ICD-10-CM

## 2020-06-22 LAB — BPAM RBC
Blood Product Expiration Date: 202201132359
ISSUE DATE / TIME: 202112101007
Unit Type and Rh: 5100

## 2020-06-22 LAB — CBC
HCT: 25.3 % — ABNORMAL LOW (ref 39.0–52.0)
Hemoglobin: 8.3 g/dL — ABNORMAL LOW (ref 13.0–17.0)
MCH: 29.6 pg (ref 26.0–34.0)
MCHC: 32.8 g/dL (ref 30.0–36.0)
MCV: 90.4 fL (ref 80.0–100.0)
Platelets: 93 10*3/uL — ABNORMAL LOW (ref 150–400)
RBC: 2.8 MIL/uL — ABNORMAL LOW (ref 4.22–5.81)
RDW: 18.7 % — ABNORMAL HIGH (ref 11.5–15.5)
WBC: 6.1 10*3/uL (ref 4.0–10.5)
nRBC: 0 % (ref 0.0–0.2)

## 2020-06-22 LAB — TYPE AND SCREEN
ABO/RH(D): O POS
Antibody Screen: NEGATIVE
Unit division: 0

## 2020-06-22 LAB — HEMOGLOBIN AND HEMATOCRIT, BLOOD
HCT: 26 % — ABNORMAL LOW (ref 39.0–52.0)
Hemoglobin: 8.4 g/dL — ABNORMAL LOW (ref 13.0–17.0)

## 2020-06-22 MED ORDER — AMLODIPINE BESYLATE 5 MG PO TABS
5.0000 mg | ORAL_TABLET | Freq: Every day | ORAL | Status: DC
  Administered 2020-06-22 – 2020-06-23 (×2): 5 mg via ORAL
  Filled 2020-06-22 (×2): qty 1

## 2020-06-22 NOTE — Progress Notes (Signed)
HD#1 Subjective:  Overnight Events: no events overngiht.   Pateint resting comfortably in bed. He denies nay new symptoms today.   Objective:  Vital signs in last 24 hours: Vitals:   06/21/20 1724 06/21/20 2011 06/21/20 2329 06/22/20 0331  BP: (!) 155/62 (!) 153/65 (!) 161/75 138/65  Pulse:  74 84 68  Resp: 20 17 18 13   Temp: 97.7 F (36.5 C) 98.4 F (36.9 C) 98.4 F (36.9 C) 98.1 F (36.7 C)  TempSrc: Axillary Oral Oral Oral  SpO2:  97% 97% 97%  Weight:      Height:       Supplemental O2: Room Air SpO2: 97 %   Physical Exam:  Physical Exam Constitutional:      Appearance: Normal appearance.  HENT:     Head: Normocephalic and atraumatic.  Eyes:     Extraocular Movements: Extraocular movements intact.  Cardiovascular:     Rate and Rhythm: Normal rate.     Pulses: Normal pulses.     Heart sounds: Normal heart sounds.  Pulmonary:     Effort: Pulmonary effort is normal.     Breath sounds: Normal breath sounds.  Abdominal:     General: Bowel sounds are normal.     Palpations: Abdomen is soft.     Tenderness: There is no abdominal tenderness.  Musculoskeletal:        General: Normal range of motion.     Cervical back: Normal range of motion.     Right lower leg: No edema.     Left lower leg: No edema.  Skin:    General: Skin is warm and dry.  Neurological:     Mental Status: He is alert and oriented to person, place, and time. Mental status is at baseline.  Psychiatric:        Mood and Affect: Mood normal.     Filed Weights   06/20/20 1009 06/20/20 2313 06/21/20 1201  Weight: 47.6 kg 49.4 kg 49.4 kg     Intake/Output Summary (Last 24 hours) at 06/22/2020 0711 Last data filed at 06/22/2020 0418 Gross per 24 hour  Intake 1823.61 ml  Output 1900 ml  Net -76.39 ml   Net IO Since Admission: 309.67 mL [06/22/20 0711]  Pertinent Labs: CBC Latest Ref Rng & Units 06/22/2020 06/21/2020 06/20/2020  WBC 4.0 - 10.5 K/uL 6.1 5.8 6.0  Hemoglobin 13.0 -  17.0 g/dL 8.3(L) 7.0(L) 8.2(L)  Hematocrit 39.0 - 52.0 % 25.3(L) 22.0(L) 25.3(L)  Platelets 150 - 400 K/uL 93(L) 93(L) 104(L)    CMP Latest Ref Rng & Units 06/21/2020 06/20/2020 06/20/2020  Glucose 70 - 99 mg/dL 88 86 114(H)  BUN 8 - 23 mg/dL 17 18 25(H)  Creatinine 0.61 - 1.24 mg/dL 0.89 0.90 1.10  Sodium 135 - 145 mmol/L 137 136 139  Potassium 3.5 - 5.1 mmol/L 3.7 4.1 4.5  Chloride 98 - 111 mmol/L 107 106 105  CO2 22 - 32 mmol/L 21(L) 22 24  Calcium 8.9 - 10.3 mg/dL 8.2(L) 7.9(L) 9.1  Total Protein 6.5 - 8.1 g/dL - 5.6(L) 6.7  Total Bilirubin 0.3 - 1.2 mg/dL - 0.4 0.6  Alkaline Phos 38 - 126 U/L - 60 75  AST 15 - 41 U/L - 26 31  ALT 0 - 44 U/L - 15 17    Imaging:   Flexible Sigmoidoscopy: Findings: Multiple medium-mouthed diverticula were found in the sigmoid colon. There was narrowing of the colon in association with the diverticular opening. There was evidence of  diverticular spasm. There was evidence of old dark blood throughout the rectum and sigmoid colon. No active bleeding noted. A single small localized angiodysplastic lesion was found in the distal rectum. Coagulation for bleeding prevention using argon plasma was successful. The exam was otherwise without abnormality. Impression: - Preparation of the colon was inadequate. - Moderate diverticulosis in the sigmoid colon. There was narrowing of the colon in association with the diverticular opening. There was evidence of diverticular spasm. There was evidence of past bleeding with old dark blood throughout the rectum and sigmoid colon. 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 Treated with argon plasma coagulation (APC). - The examination was otherwise  Assessment/Plan:   Active Problems:   Lower GI bleed   Abnormal CT scan, colon   Patient Summary: Dustin Howell is a 86 y.o. male with apertinent past medical history of GI bleed(4 years ago requiring ablation),CAD (s/p DES to OM1),presented to  [facility] with a chief complaint of lower GI bleedadmitted for further medical management.  #Lower GI bleed: #Diverticulosis  Patient has not had any more episodes of hematochezia. Flex Sig showed non bleeding avm/diverticula with signs of old bleed. He is s/p 1 unit PRBCs and Feraheme infusion. Hgb improved at 8.3 - Appreciat eGI and IR's assistance - Advance diet per GI recommendations.  - Avoid NSAIDs for 2 weeks   #CAD s/p DESin 2018 - Holding ASA for 2 weeks  #Hypertension, hyperlipidemia - Continue atorvastatin,  - Restart amlodipine today  #Low grade B cell lymphoproliferative disorder   Not on treatment given age and frailty. Platelet levels appear stable compared to prior studies in 2019  Diet: Liquid IVF: LR,125cc/hr VTE: None Code: Full  Dispo: Anticipated discharge to Home in 1 days pending medical management.Marland Kitchen   Lawerance Cruel, D.O.  Internal Medicine Resident, PGY-2 [facility]  Pager: [phone] 7:11 AM, 06/22/2020   Please contact the on call pager after 5 pm and on weekends at [phone].

## 2020-06-22 NOTE — Progress Notes (Addendum)
Patient ID: Dustin Howell, male   DOB: 11/20/1924, 86 y.o.   MRN: [id number]    Progress Note   Subjective   Day # 2  CC; acute GI bleed  Flexible sigmoidoscopy yesterday-multiple medium sized diverticuli in the sigmoid colon, some narrowing associated with the diverticulosis, old dark blood throughout the rectum and sigmoid no active bleeding noted, 1 small AVM in the distal rectum treated with APC  Hemoglobin 8.3/hematocrit 25.3-stable  Patient has no complaints of abdominal discomfort, does not believe he had a bowel movement, anxious to go home   Objective   Vital signs in last 24 hours: Temp:  [97.4 F (36.3 C)-98.7 F (37.1 C)] 98.1 F (36.7 C) (12/11 1042) Pulse Rate:  [68-84] 79 (12/11 1042) Resp:  [13-21] 18 (12/11 1042) BP: (128-165)/(52-107) 128/72 (12/11 1042) SpO2:  [76 %-100 %] 98 % (12/11 1042) Weight:  [49.4 kg] 49.4 kg (12/10 1201) Last BM Date: 06/20/20 General: elderly WMin NAD Heart:  Regular rate and rhythm; no murmurs Lungs: Respirations even and unlabored, lungs CTA bilaterally Abdomen:  Soft, nontender and nondistended. Normal bowel sounds. Extremities:  Without edema. Neurologic:  Alert and oriented,  grossly normal neurologically. Psych:  Cooperative. Normal mood and affect.  Intake/Output from previous day: 12/10 0701 - 12/11 0700 In: 1823.6 [P.O.:320; I.V.:1111.6; Blood:392] Out: 1900 [WIOXB:[id number]] Intake/Output this shift: No intake/output data recorded.  Lab Results: Recent Labs    06/20/20 1600 06/21/20 0326 06/22/20 0446  WBC 6.0 5.8 6.1  HGB 8.2* 7.0* 8.3*  HCT 25.3* 22.0* 25.3*  PLT 104* 93* 93*   BMET Recent Labs    06/20/20 1020 06/20/20 1600 06/21/20 0326  NA 139 136 137  K 4.5 4.1 3.7  CL 105 106 107  CO2 24 22 21*  GLUCOSE 114* 86 88  BUN 25* 18 17  CREATININE 1.10 0.90 0.89  CALCIUM 9.1 7.9* 8.2*   LFT Recent Labs    06/20/20 1600  PROT 5.6*  ALBUMIN 3.4*  AST 26  ALT 15  ALKPHOS 60  BILITOT 0.4    PT/INR No results for input(s): LABPROT, INR in the last 72 hours.  Studies/Results: IR Angiogram Visceral Selective  Result Date: 06/21/2020 CLINICAL DATA:  GI bleed, localized to rectosigmoid junction on recent CTA. EXAM: SELECTIVE VISCERAL ARTERIOGRAPHY; ADDITIONAL ARTERIOGRAPHY; IR ULTRASOUND GUIDANCE VASC ACCESS RIGHT ANESTHESIA/SEDATION: Intravenous Fentanyl 27mcg and Versed 1.5mg  were administered as conscious sedation during continuous monitoring of the patient's level of consciousness and physiological / cardiorespiratory status by the radiology RN, with a total moderate sedation time of 40 minutes. MEDICATIONS: Lidocaine 1% subcutaneous CONTRAST:  49mL OMNIPAQUE IOHEXOL 300 MG/ML SOLN, 92mL OMNIPAQUE IOHEXOL 300 MG/ML SOLN PROCEDURE: The procedure, risks (including but not limited to bleeding, infection, organ damage ), benefits, and alternatives were explained to the patient. Questions regarding the procedure were encouraged and answered. The patient understands and consents to the procedure. Right femoral region prepped and draped in usual sterile fashion. Maximal barrier sterile technique was utilized including caps, mask, sterile gowns, sterile gloves, sterile drape, hand hygiene and skin antiseptic. The right common femoral artery was localized under ultrasound. Under real-time ultrasound guidance, the vessel was accessed with a 21-gauge micropuncture needle, exchanged over a 018 guidewire for a transitional dilator, through which a 035 guidewire was advanced. Over this, a 5 [place] vascular sheath was placed, through which a 5 [place] RIM catheter was advanced and used to selectively catheterize the inferior mesenteric artery for selective arteriography in multiple projections. A coaxial Renegade  microcatheter with 014 fathom guidewire was advanced and used to selectively catheterize multiple second third order branches for arteriography in multiple projections. The microcatheter was removed  and additional arteriography through the RIM was performed additional projections. The microcatheter was then advanced again for additional arteriography. The microcatheter, catheter and sheath were removed and hemostasis achieved with the aid of the Exoseal device after confirmatory femoral arteriography. The patient tolerated the procedure well. COMPLICATIONS: None immediate FINDINGS: Small focus of active arterial extravasation in the distal sigmoid colon on the initial selective arteriogram. However, this could not be localized on subsequent subselective and oblique arteriography to allow approach for embolization. No evidence of early draining vein, AVM, mass or other lesion to suggest underlying etiology of the bleeding. Venous phase confirms patency of the IMV and portal venous system. IMPRESSION: 1. Incomplete localization of distal sigmoid active arterial extravasation. Recommend sigmoidoscopy for further evaluation and possible treatment. Electronically Signed   By: Lucrezia Europe M.D.   On: 06/21/2020 08:38   IR Angiogram Visceral Selective  Result Date: 06/21/2020 CLINICAL DATA:  GI bleed, localized to rectosigmoid junction on recent CTA. EXAM: SELECTIVE VISCERAL ARTERIOGRAPHY; ADDITIONAL ARTERIOGRAPHY; IR ULTRASOUND GUIDANCE VASC ACCESS RIGHT ANESTHESIA/SEDATION: Intravenous Fentanyl 36mcg and Versed 1.5mg  were administered as conscious sedation during continuous monitoring of the patient's level of consciousness and physiological / cardiorespiratory status by the radiology RN, with a total moderate sedation time of 40 minutes. MEDICATIONS: Lidocaine 1% subcutaneous CONTRAST:  53mL OMNIPAQUE IOHEXOL 300 MG/ML SOLN, 65mL OMNIPAQUE IOHEXOL 300 MG/ML SOLN PROCEDURE: The procedure, risks (including but not limited to bleeding, infection, organ damage ), benefits, and alternatives were explained to the patient. Questions regarding the procedure were encouraged and answered. The patient understands and  consents to the procedure. Right femoral region prepped and draped in usual sterile fashion. Maximal barrier sterile technique was utilized including caps, mask, sterile gowns, sterile gloves, sterile drape, hand hygiene and skin antiseptic. The right common femoral artery was localized under ultrasound. Under real-time ultrasound guidance, the vessel was accessed with a 21-gauge micropuncture needle, exchanged over a 018 guidewire for a transitional dilator, through which a 035 guidewire was advanced. Over this, a 5 [place] vascular sheath was placed, through which a 5 [place] RIM catheter was advanced and used to selectively catheterize the inferior mesenteric artery for selective arteriography in multiple projections. A coaxial Renegade microcatheter with 014 fathom guidewire was advanced and used to selectively catheterize multiple second third order branches for arteriography in multiple projections. The microcatheter was removed and additional arteriography through the RIM was performed additional projections. The microcatheter was then advanced again for additional arteriography. The microcatheter, catheter and sheath were removed and hemostasis achieved with the aid of the Exoseal device after confirmatory femoral arteriography. The patient tolerated the procedure well. COMPLICATIONS: None immediate FINDINGS: Small focus of active arterial extravasation in the distal sigmoid colon on the initial selective arteriogram. However, this could not be localized on subsequent subselective and oblique arteriography to allow approach for embolization. No evidence of early draining vein, AVM, mass or other lesion to suggest underlying etiology of the bleeding. Venous phase confirms patency of the IMV and portal venous system. IMPRESSION: 1. Incomplete localization of distal sigmoid active arterial extravasation. Recommend sigmoidoscopy for further evaluation and possible treatment. Electronically Signed   By: Lucrezia Europe  M.D.   On: 06/21/2020 08:38   IR Angiogram Selective Each Additional Vessel  Result Date: 06/21/2020 CLINICAL DATA:  GI bleed, localized to rectosigmoid junction on recent CTA.  EXAM: SELECTIVE VISCERAL ARTERIOGRAPHY; ADDITIONAL ARTERIOGRAPHY; IR ULTRASOUND GUIDANCE VASC ACCESS RIGHT ANESTHESIA/SEDATION: Intravenous Fentanyl 15mcg and Versed 1.5mg  were administered as conscious sedation during continuous monitoring of the patient's level of consciousness and physiological / cardiorespiratory status by the radiology RN, with a total moderate sedation time of 40 minutes. MEDICATIONS: Lidocaine 1% subcutaneous CONTRAST:  12mL OMNIPAQUE IOHEXOL 300 MG/ML SOLN, 61mL OMNIPAQUE IOHEXOL 300 MG/ML SOLN PROCEDURE: The procedure, risks (including but not limited to bleeding, infection, organ damage ), benefits, and alternatives were explained to the patient. Questions regarding the procedure were encouraged and answered. The patient understands and consents to the procedure. Right femoral region prepped and draped in usual sterile fashion. Maximal barrier sterile technique was utilized including caps, mask, sterile gowns, sterile gloves, sterile drape, hand hygiene and skin antiseptic. The right common femoral artery was localized under ultrasound. Under real-time ultrasound guidance, the vessel was accessed with a 21-gauge micropuncture needle, exchanged over a 018 guidewire for a transitional dilator, through which a 035 guidewire was advanced. Over this, a 5 [place] vascular sheath was placed, through which a 5 [place] RIM catheter was advanced and used to selectively catheterize the inferior mesenteric artery for selective arteriography in multiple projections. A coaxial Renegade microcatheter with 014 fathom guidewire was advanced and used to selectively catheterize multiple second third order branches for arteriography in multiple projections. The microcatheter was removed and additional arteriography through the RIM  was performed additional projections. The microcatheter was then advanced again for additional arteriography. The microcatheter, catheter and sheath were removed and hemostasis achieved with the aid of the Exoseal device after confirmatory femoral arteriography. The patient tolerated the procedure well. COMPLICATIONS: None immediate FINDINGS: Small focus of active arterial extravasation in the distal sigmoid colon on the initial selective arteriogram. However, this could not be localized on subsequent subselective and oblique arteriography to allow approach for embolization. No evidence of early draining vein, AVM, mass or other lesion to suggest underlying etiology of the bleeding. Venous phase confirms patency of the IMV and portal venous system. IMPRESSION: 1. Incomplete localization of distal sigmoid active arterial extravasation. Recommend sigmoidoscopy for further evaluation and possible treatment. Electronically Signed   By: Lucrezia Europe M.D.   On: 06/21/2020 08:38   IR US Guide Vasc Access Right  Result Date: 06/21/2020 CLINICAL DATA:  GI bleed, localized to rectosigmoid junction on recent CTA. EXAM: SELECTIVE VISCERAL ARTERIOGRAPHY; ADDITIONAL ARTERIOGRAPHY; IR ULTRASOUND GUIDANCE VASC ACCESS RIGHT ANESTHESIA/SEDATION: Intravenous Fentanyl 45mcg and Versed 1.5mg  were administered as conscious sedation during continuous monitoring of the patient's level of consciousness and physiological / cardiorespiratory status by the radiology RN, with a total moderate sedation time of 40 minutes. MEDICATIONS: Lidocaine 1% subcutaneous CONTRAST:  46mL OMNIPAQUE IOHEXOL 300 MG/ML SOLN, 99mL OMNIPAQUE IOHEXOL 300 MG/ML SOLN PROCEDURE: The procedure, risks (including but not limited to bleeding, infection, organ damage ), benefits, and alternatives were explained to the patient. Questions regarding the procedure were encouraged and answered. The patient understands and consents to the procedure. Right femoral region  prepped and draped in usual sterile fashion. Maximal barrier sterile technique was utilized including caps, mask, sterile gowns, sterile gloves, sterile drape, hand hygiene and skin antiseptic. The right common femoral artery was localized under ultrasound. Under real-time ultrasound guidance, the vessel was accessed with a 21-gauge micropuncture needle, exchanged over a 018 guidewire for a transitional dilator, through which a 035 guidewire was advanced. Over this, a 5 [place] vascular sheath was placed, through which a 5 [place] RIM catheter was advanced  and used to selectively catheterize the inferior mesenteric artery for selective arteriography in multiple projections. A coaxial Renegade microcatheter with 014 fathom guidewire was advanced and used to selectively catheterize multiple second third order branches for arteriography in multiple projections. The microcatheter was removed and additional arteriography through the RIM was performed additional projections. The microcatheter was then advanced again for additional arteriography. The microcatheter, catheter and sheath were removed and hemostasis achieved with the aid of the Exoseal device after confirmatory femoral arteriography. The patient tolerated the procedure well. COMPLICATIONS: None immediate FINDINGS: Small focus of active arterial extravasation in the distal sigmoid colon on the initial selective arteriogram. However, this could not be localized on subsequent subselective and oblique arteriography to allow approach for embolization. No evidence of early draining vein, AVM, mass or other lesion to suggest underlying etiology of the bleeding. Venous phase confirms patency of the IMV and portal venous system. IMPRESSION: 1. Incomplete localization of distal sigmoid active arterial extravasation. Recommend sigmoidoscopy for further evaluation and possible treatment. Electronically Signed   By: Lucrezia Europe M.D.   On: 06/21/2020 08:38   CT Angio Abd/Pel  w/ and/or w/o  Result Date: 06/20/2020 CLINICAL DATA:  86 year old with melena. History of prior lower GI bleeding and catheter directed embolization in 2017. EXAM: CTA ABDOMEN AND PELVIS WITHOUT AND WITH CONTRAST TECHNIQUE: Multidetector CT imaging of the abdomen and pelvis was performed using the standard protocol during bolus administration of intravenous contrast. Multiplanar reconstructed images and MIPs were obtained and reviewed to evaluate the vascular anatomy. CONTRAST:  161mL OMNIPAQUE IOHEXOL 350 MG/ML SOLN COMPARISON:  05/11/2016 FINDINGS: VASCULAR Aorta: There is stable penetrating ulcer along the left anterior proximal abdominal aorta at the 1 o'clock position on sequence 7, image 15. This small penetrating ulcer measures roughly 0.6 cm. Infrarenal abdominal aorta is slightly aneurysmal measuring up to 3.0 cm on sequence 7, image 31. There is irregular mural thrombus with ulcerative plaque or penetrating ulcer along the left posterior aspect of the abdominal aorta at the 5 o'clock position. This ulcerative plaque or penetrating ulcer measures roughly 1.0 cm on sequence 10, image image 76. There is more ulceration in this area compared to 2017. No significant aortic stenosis. No evidence for aortic dissection. 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 SMA: Patent without evidence of aneurysm, dissection, vasculitis or significant stenosis. Renals: Both renal arteries are patent without evidence of aneurysm, dissection, vasculitis, fibromuscular dysplasia or significant stenosis. IMA: Patent without evidence of aneurysm, dissection, vasculitis or significant stenosis. Inflow: Atherosclerotic plaque involving the common, internal and external iliac arteries bilaterally without significant stenosis. No evidence for aneurysm or dissection involving the iliac arteries. Proximal Outflow: Proximal femoral arteries are patent bilaterally. Veins: Iliac veins are patent. IVC and renal veins  are patent. Portal venous system is patent. Review of the MIP images confirms the above findings. NON-VASCULAR Lower chest: Lung bases are clear. Hepatobiliary: Normal appearance of the liver, gallbladder and portal venous system. No biliary dilatation. Pancreas: Unremarkable. No pancreatic ductal dilatation or surrounding inflammatory changes. Spleen: Normal in size without focal abnormality. Adrenals/Urinary Tract: Normal appearance of the adrenal glands. 3 mm calcification in the right kidney is suggestive for a nonobstructive stone. Small exophytic cyst in the right kidney lower pole measures 1.0 cm. No hydronephrosis. Cyst involving the left kidney upper pole measures up to 5.9 cm. Probable left parapelvic renal cysts. Small cyst in left kidney lower pole. Normal appearance of the urinary bladder. Stomach/Bowel: Small focus of arterial extravasation  in the rectosigmoid colon on sequence 7, image 87. There is additional contrast within the colon on the delayed images on sequence 13, image 52. Findings compatible with an area of active GI bleeding. Extensive diverticulosis involving the sigmoid colon. No other areas are suggestive for active GI bleeding. Normal appearance of the stomach. Lymphatic: No significant lymph node enlargement in the abdomen or pelvis. Reproductive: Prostate is unremarkable. Other: Negative for ascites.  Negative for free air. Musculoskeletal: Disc space narrowing at L5-S1. IMPRESSION: VASCULAR 1. Positive for active GI bleeding. Bleeding is located near the rectosigmoid junction. Etiology for the bleeding is uncertain but the patient does have significant diverticular disease in this area. 2. 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 Enlarged penetrating ulcer or ulcerative plaque along the left posterior aspect of the infrarenal abdominal aorta measuring up to 1.0 cm. 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 3. Chronic stenosis involving the celiac trunk. NON-VASCULAR 1. Colonic diverticulosis. 2. Bilateral renal cysts. 3. Nonobstructive right kidney stone. These results were called by telephone at the time of interpretation on 06/20/2020 at 4:55 pm to provider Dr. Karle Starch, who verbally acknowledged these results. Electronically Signed   By: Markus Daft M.D.   On: 06/20/2020 17:14       Assessment / Plan:    #29 86 year old white male with acute lower GI bleed felt to be diverticular in etiology. Patient also had APC of 1 rectal AVM at sigmoidoscopy yesterday Hemoglobin overall stable  #2 chronic thrombocytopenia, history of low-grade B-cell lymphoproliferative disorder #3 coronary artery disease, status post drug-eluting stent 2018-has been on aspirin only #4 hypertension  Plan:  Observe another 24 hours Continue to trend hemoglobin Hold aspirin for 2 weeks Will advance to soft diet If no further bleeding and hemoglobin stable tomorrow expect he will be able to be discharged.   LOS: 1 day   Amy Esterwood PA-C 06/22/2020, 11:14 AM     Attending Physician Note   I have taken an interval history, reviewed the chart and examined the patient. I agree with the Advanced Practitioner's note, impression and recommendations.   Acute LGI bleed, resolving. Presumed diverticular however a rectal AVM was also found and treated.   Observe overnight, trend Hgb, advance diet.  If stable tomorrow should be ok for discharge.  Outpatient GI follow up is not needed at this time.  Outpatient follow up with PCP to monitor anemia.   Lucio Edward, MD [facility]

## 2020-06-23 ENCOUNTER — Encounter (HOSPITAL_COMMUNITY): Payer: Self-pay | Admitting: Gastroenterology

## 2020-06-23 LAB — CBC
HCT: 26.7 % — ABNORMAL LOW (ref 39.0–52.0)
Hemoglobin: 8.6 g/dL — ABNORMAL LOW (ref 13.0–17.0)
MCH: 29.1 pg (ref 26.0–34.0)
MCHC: 32.2 g/dL (ref 30.0–36.0)
MCV: 90.2 fL (ref 80.0–100.0)
Platelets: 94 10*3/uL — ABNORMAL LOW (ref 150–400)
RBC: 2.96 MIL/uL — ABNORMAL LOW (ref 4.22–5.81)
RDW: 17.8 % — ABNORMAL HIGH (ref 11.5–15.5)
WBC: 6.1 10*3/uL (ref 4.0–10.5)
nRBC: 0 % (ref 0.0–0.2)

## 2020-06-23 LAB — HEMOGLOBIN AND HEMATOCRIT, BLOOD
HCT: 25.2 % — ABNORMAL LOW (ref 39.0–52.0)
Hemoglobin: 8.5 g/dL — ABNORMAL LOW (ref 13.0–17.0)

## 2020-06-23 MED ORDER — ASPIRIN 81 MG PO CHEW: 81.0000 mg | CHEWABLE_TABLET | Freq: Every day | ORAL | 5 refills | Status: DC

## 2020-06-23 NOTE — Discharge Summary (Signed)
Name: Dustin Howell MRN: [id number] DOB: 09-Nov-1924 86 y.o. PCP: [name], MD  Date of Admission: 06/20/2020 10:07 AM Date of Discharge:  06/23/2020 Attending Physician: Dr. Angelia Mould  Discharge Diagnosis: Active Problems:   Lower GI bleed   Abnormal CT scan, colon    Discharge Medications: Allergies as of 06/23/2020   No Known Allergies     Medication List    STOP taking these medications   alum & mag hydroxide-simeth 200-200-20 MG/5ML suspension Commonly known as: MAALOX/MYLANTA   clopidogrel 75 MG tablet Commonly known as: PLAVIX   feeding supplement Liqd   metoprolol tartrate 25 MG tablet Commonly known as: Dentist Powd   thiamine 100 MG tablet     TAKE these medications   amLODipine 5 MG tablet Commonly known as: NORVASC Take 1 tablet (5 mg total) by mouth daily.   aspirin 81 MG chewable tablet Chew 1 tablet (81 mg total) by mouth daily. Start taking on: July 05, 2020 What changed: These instructions start on July 05, 2020. If you are unsure what to do until then, ask your doctor or other care provider.   atorvastatin 10 MG tablet Commonly known as: LIPITOR Take 10 mg by mouth daily.   nitroGLYCERIN 0.4 MG SL tablet Commonly known as: NITROSTAT Place 0.4 mg under the tongue every 5 (five) minutes as needed for chest pain.       Disposition and follow-up:   Mr.Dustin Howell was discharged from [facility] in Good condition.  At the hospital follow up visit please address:  1.  Follow-up:  A.  Iron deficiency anemia in the setting of lower GI bleed: -Please repeat CBC. -Patient was given Feraheme infusion in the hospital but may need iron supplementation in the outpatient setting. Patient should avoid NSAIDs for 2 weeks including aspirin.  This was noted on patient's discharge summary.   2.  Labs / imaging needed at time of follow-up: Basic Metabolic Profile and CBC  3.  Pending  labs/ test needing follow-up: None  Follow-up Appointments:  Follow-up Information    [name], MD. Call in 1 week(s).   Specialty: Family Medicine Contact information: [place] [id number] [place] Hospital Course by problem list:  Dustin Howell is a 86 y.o. male with apertinent past medical history of GI bleed(4 years ago requiring ablation),CAD (s/p DES to OM1),presented to [facility] with a chief complaint of lower GI bleedadmitted for further medical management.  1.  Lower GI bleed-patient presented to [facility] with hematochezia.  She also consult inform CTA showing lower GI bleed.  IR was consulted for angiography without any evidence of bleed.  GI subsequently performed a flexible sigmoidoscopy showing AVM and diverticula with evidence of old bleed, and treated at that time.  Patient was given 1 unit of packed red blood cells due to a hemoglobin of 7 and a Feraheme transfusion for iron supplementation.  Patient was discharged with all symptoms and stable hemoglobin.  Discharge Vitals:   BP (!) 142/58 (BP Location: Right Arm)   Pulse 70   Temp 98 F (36.7 C) (Oral)   Resp 16   Ht 5\' 7"  (1.702 m)   Wt 49.4 kg   SpO2 99%   BMI 17.06 kg/m   Pertinent Labs, Studies, and Procedures:  CBC Latest Ref Rng & Units 06/23/2020 06/22/2020  06/22/2020  WBC 4.0 - 10.5 K/uL 6.1 - -  Hemoglobin 13.0 - 17.0 g/dL 8.6(L) 8.5(L) 8.4(L)  Hematocrit 39.0 - 52.0 % 26.7(L) 25.2(L) 26.0(L)  Platelets 150 - 400 K/uL 94(L) - -    CMP Latest Ref Rng & Units 06/21/2020 06/20/2020 06/20/2020  Glucose 70 - 99 mg/dL 88 86 114(H)  BUN 8 - 23 mg/dL 17 18 25(H)  Creatinine 0.61 - 1.24 mg/dL 0.89 0.90 1.10  Sodium 135 - 145 mmol/L 137 136 139  Potassium 3.5 - 5.1 mmol/L 3.7 4.1 4.5  Chloride 98 - 111 mmol/L 107 106 105  CO2 22 - 32 mmol/L 21(L) 22 24  Calcium 8.9 - 10.3 mg/dL 8.2(L) 7.9(L) 9.1  Total Protein 6.5 - 8.1 g/dL  - 5.6(L) 6.7  Total Bilirubin 0.3 - 1.2 mg/dL - 0.4 0.6  Alkaline Phos 38 - 126 U/L - 60 75  AST 15 - 41 U/L - 26 31  ALT 0 - 44 U/L - 15 17    IR Angiogram Visceral Selective  Result Date: 06/21/2020 CLINICAL DATA:  GI bleed, localized to rectosigmoid junction on recent CTA. EXAM: SELECTIVE VISCERAL ARTERIOGRAPHY; ADDITIONAL ARTERIOGRAPHY; IR ULTRASOUND GUIDANCE VASC ACCESS RIGHT ANESTHESIA/SEDATION: Intravenous Fentanyl 17mcg and Versed 1.5mg  were administered as conscious sedation during continuous monitoring of the patient's level of consciousness and physiological / cardiorespiratory status by the radiology RN, with a total moderate sedation time of 40 minutes. MEDICATIONS: Lidocaine 1% subcutaneous CONTRAST:  32mL OMNIPAQUE IOHEXOL 300 MG/ML SOLN, 48mL OMNIPAQUE IOHEXOL 300 MG/ML SOLN PROCEDURE: The procedure, risks (including but not limited to bleeding, infection, organ damage ), benefits, and alternatives were explained to the patient. Questions regarding the procedure were encouraged and answered. The patient understands and consents to the procedure. Right femoral region prepped and draped in usual sterile fashion. Maximal barrier sterile technique was utilized including caps, mask, sterile gowns, sterile gloves, sterile drape, hand hygiene and skin antiseptic. The right common femoral artery was localized under ultrasound. Under real-time ultrasound guidance, the vessel was accessed with a 21-gauge micropuncture needle, exchanged over a 018 guidewire for a transitional dilator, through which a 035 guidewire was advanced. Over this, a 5 [place] vascular sheath was placed, through which a 5 [place] RIM catheter was advanced and used to selectively catheterize the inferior mesenteric artery for selective arteriography in multiple projections. A coaxial Renegade microcatheter with 014 fathom guidewire was advanced and used to selectively catheterize multiple second third order branches for  arteriography in multiple projections. The microcatheter was removed and additional arteriography through the RIM was performed additional projections. The microcatheter was then advanced again for additional arteriography. The microcatheter, catheter and sheath were removed and hemostasis achieved with the aid of the Exoseal device after confirmatory femoral arteriography. The patient tolerated the procedure well. COMPLICATIONS: None immediate FINDINGS: Small focus of active arterial extravasation in the distal sigmoid colon on the initial selective arteriogram. However, this could not be localized on subsequent subselective and oblique arteriography to allow approach for embolization. No evidence of early draining vein, AVM, mass or other lesion to suggest underlying etiology of the bleeding. Venous phase confirms patency of the IMV and portal venous system. IMPRESSION: 1. Incomplete localization of distal sigmoid active arterial extravasation. Recommend sigmoidoscopy for further evaluation and possible treatment. Electronically Signed   By: Lucrezia Europe M.D.   On: 06/21/2020 08:38   IR Angiogram Visceral Selective  Result Date: 06/21/2020 CLINICAL DATA:  GI bleed, localized to rectosigmoid junction on recent CTA. EXAM: SELECTIVE VISCERAL  ARTERIOGRAPHY; ADDITIONAL ARTERIOGRAPHY; IR ULTRASOUND GUIDANCE VASC ACCESS RIGHT ANESTHESIA/SEDATION: Intravenous Fentanyl 45mcg and Versed 1.5mg  were administered as conscious sedation during continuous monitoring of the patient's level of consciousness and physiological / cardiorespiratory status by the radiology RN, with a total moderate sedation time of 40 minutes. MEDICATIONS: Lidocaine 1% subcutaneous CONTRAST:  13mL OMNIPAQUE IOHEXOL 300 MG/ML SOLN, 34mL OMNIPAQUE IOHEXOL 300 MG/ML SOLN PROCEDURE: The procedure, risks (including but not limited to bleeding, infection, organ damage ), benefits, and alternatives were explained to the patient. Questions regarding the  procedure were encouraged and answered. The patient understands and consents to the procedure. Right femoral region prepped and draped in usual sterile fashion. Maximal barrier sterile technique was utilized including caps, mask, sterile gowns, sterile gloves, sterile drape, hand hygiene and skin antiseptic. The right common femoral artery was localized under ultrasound. Under real-time ultrasound guidance, the vessel was accessed with a 21-gauge micropuncture needle, exchanged over a 018 guidewire for a transitional dilator, through which a 035 guidewire was advanced. Over this, a 5 [place] vascular sheath was placed, through which a 5 [place] RIM catheter was advanced and used to selectively catheterize the inferior mesenteric artery for selective arteriography in multiple projections. A coaxial Renegade microcatheter with 014 fathom guidewire was advanced and used to selectively catheterize multiple second third order branches for arteriography in multiple projections. The microcatheter was removed and additional arteriography through the RIM was performed additional projections. The microcatheter was then advanced again for additional arteriography. The microcatheter, catheter and sheath were removed and hemostasis achieved with the aid of the Exoseal device after confirmatory femoral arteriography. The patient tolerated the procedure well. COMPLICATIONS: None immediate FINDINGS: Small focus of active arterial extravasation in the distal sigmoid colon on the initial selective arteriogram. However, this could not be localized on subsequent subselective and oblique arteriography to allow approach for embolization. No evidence of early draining vein, AVM, mass or other lesion to suggest underlying etiology of the bleeding. Venous phase confirms patency of the IMV and portal venous system. IMPRESSION: 1. Incomplete localization of distal sigmoid active arterial extravasation. Recommend sigmoidoscopy for further  evaluation and possible treatment. Electronically Signed   By: Lucrezia Europe M.D.   On: 06/21/2020 08:38   IR Angiogram Selective Each Additional Vessel  Result Date: 06/21/2020 CLINICAL DATA:  GI bleed, localized to rectosigmoid junction on recent CTA. EXAM: SELECTIVE VISCERAL ARTERIOGRAPHY; ADDITIONAL ARTERIOGRAPHY; IR ULTRASOUND GUIDANCE VASC ACCESS RIGHT ANESTHESIA/SEDATION: Intravenous Fentanyl 42mcg and Versed 1.5mg  were administered as conscious sedation during continuous monitoring of the patient's level of consciousness and physiological / cardiorespiratory status by the radiology RN, with a total moderate sedation time of 40 minutes. MEDICATIONS: Lidocaine 1% subcutaneous CONTRAST:  45mL OMNIPAQUE IOHEXOL 300 MG/ML SOLN, 65mL OMNIPAQUE IOHEXOL 300 MG/ML SOLN PROCEDURE: The procedure, risks (including but not limited to bleeding, infection, organ damage ), benefits, and alternatives were explained to the patient. Questions regarding the procedure were encouraged and answered. The patient understands and consents to the procedure. Right femoral region prepped and draped in usual sterile fashion. Maximal barrier sterile technique was utilized including caps, mask, sterile gowns, sterile gloves, sterile drape, hand hygiene and skin antiseptic. The right common femoral artery was localized under ultrasound. Under real-time ultrasound guidance, the vessel was accessed with a 21-gauge micropuncture needle, exchanged over a 018 guidewire for a transitional dilator, through which a 035 guidewire was advanced. Over this, a 5 [place] vascular sheath was placed, through which a 5 [place] RIM catheter was advanced and used to  selectively catheterize the inferior mesenteric artery for selective arteriography in multiple projections. A coaxial Renegade microcatheter with 014 fathom guidewire was advanced and used to selectively catheterize multiple second third order branches for arteriography in multiple projections.  The microcatheter was removed and additional arteriography through the RIM was performed additional projections. The microcatheter was then advanced again for additional arteriography. The microcatheter, catheter and sheath were removed and hemostasis achieved with the aid of the Exoseal device after confirmatory femoral arteriography. The patient tolerated the procedure well. COMPLICATIONS: None immediate FINDINGS: Small focus of active arterial extravasation in the distal sigmoid colon on the initial selective arteriogram. However, this could not be localized on subsequent subselective and oblique arteriography to allow approach for embolization. No evidence of early draining vein, AVM, mass or other lesion to suggest underlying etiology of the bleeding. Venous phase confirms patency of the IMV and portal venous system. IMPRESSION: 1. Incomplete localization of distal sigmoid active arterial extravasation. Recommend sigmoidoscopy for further evaluation and possible treatment. Electronically Signed   By: Lucrezia Europe M.D.   On: 06/21/2020 08:38   IR US Guide Vasc Access Right  Result Date: 06/21/2020 CLINICAL DATA:  GI bleed, localized to rectosigmoid junction on recent CTA. EXAM: SELECTIVE VISCERAL ARTERIOGRAPHY; ADDITIONAL ARTERIOGRAPHY; IR ULTRASOUND GUIDANCE VASC ACCESS RIGHT ANESTHESIA/SEDATION: Intravenous Fentanyl 69mcg and Versed 1.5mg  were administered as conscious sedation during continuous monitoring of the patient's level of consciousness and physiological / cardiorespiratory status by the radiology RN, with a total moderate sedation time of 40 minutes. MEDICATIONS: Lidocaine 1% subcutaneous CONTRAST:  22mL OMNIPAQUE IOHEXOL 300 MG/ML SOLN, 7mL OMNIPAQUE IOHEXOL 300 MG/ML SOLN PROCEDURE: The procedure, risks (including but not limited to bleeding, infection, organ damage ), benefits, and alternatives were explained to the patient. Questions regarding the procedure were encouraged and answered. The  patient understands and consents to the procedure. Right femoral region prepped and draped in usual sterile fashion. Maximal barrier sterile technique was utilized including caps, mask, sterile gowns, sterile gloves, sterile drape, hand hygiene and skin antiseptic. The right common femoral artery was localized under ultrasound. Under real-time ultrasound guidance, the vessel was accessed with a 21-gauge micropuncture needle, exchanged over a 018 guidewire for a transitional dilator, through which a 035 guidewire was advanced. Over this, a 5 [place] vascular sheath was placed, through which a 5 [place] RIM catheter was advanced and used to selectively catheterize the inferior mesenteric artery for selective arteriography in multiple projections. A coaxial Renegade microcatheter with 014 fathom guidewire was advanced and used to selectively catheterize multiple second third order branches for arteriography in multiple projections. The microcatheter was removed and additional arteriography through the RIM was performed additional projections. The microcatheter was then advanced again for additional arteriography. The microcatheter, catheter and sheath were removed and hemostasis achieved with the aid of the Exoseal device after confirmatory femoral arteriography. The patient tolerated the procedure well. COMPLICATIONS: None immediate FINDINGS: Small focus of active arterial extravasation in the distal sigmoid colon on the initial selective arteriogram. However, this could not be localized on subsequent subselective and oblique arteriography to allow approach for embolization. No evidence of early draining vein, AVM, mass or other lesion to suggest underlying etiology of the bleeding. Venous phase confirms patency of the IMV and portal venous system. IMPRESSION: 1. Incomplete localization of distal sigmoid active arterial extravasation. Recommend sigmoidoscopy for further evaluation and possible treatment. Electronically  Signed   By: Lucrezia Europe M.D.   On: 06/21/2020 08:38   CT Angio Abd/Pel w/ and/or  w/o  Result Date: 06/20/2020 CLINICAL DATA:  86 year old with melena. History of prior lower GI bleeding and catheter directed embolization in 2017. EXAM: CTA ABDOMEN AND PELVIS WITHOUT AND WITH CONTRAST TECHNIQUE: Multidetector CT imaging of the abdomen and pelvis was performed using the standard protocol during bolus administration of intravenous contrast. Multiplanar reconstructed images and MIPs were obtained and reviewed to evaluate the vascular anatomy. CONTRAST:  167mL OMNIPAQUE IOHEXOL 350 MG/ML SOLN COMPARISON:  05/11/2016 FINDINGS: VASCULAR Aorta: There is stable penetrating ulcer along the left anterior proximal abdominal aorta at the 1 o'clock position on sequence 7, image 15. This small penetrating ulcer measures roughly 0.6 cm. Infrarenal abdominal aorta is slightly aneurysmal measuring up to 3.0 cm on sequence 7, image 31. There is irregular mural thrombus with ulcerative plaque or penetrating ulcer along the left posterior aspect of the abdominal aorta at the 5 o'clock position. This ulcerative plaque or penetrating ulcer measures roughly 1.0 cm on sequence 10, image image 76. There is more ulceration in this area compared to 2017. No significant aortic stenosis. No evidence for aortic dissection. 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 SMA: Patent without evidence of aneurysm, dissection, vasculitis or significant stenosis. Renals: Both renal arteries are patent without evidence of aneurysm, dissection, vasculitis, fibromuscular dysplasia or significant stenosis. IMA: Patent without evidence of aneurysm, dissection, vasculitis or significant stenosis. Inflow: Atherosclerotic plaque involving the common, internal and external iliac arteries bilaterally without significant stenosis. No evidence for aneurysm or dissection involving the iliac arteries. Proximal Outflow: Proximal femoral arteries are  patent bilaterally. Veins: Iliac veins are patent. IVC and renal veins are patent. Portal venous system is patent. Review of the MIP images confirms the above findings. NON-VASCULAR Lower chest: Lung bases are clear. Hepatobiliary: Normal appearance of the liver, gallbladder and portal venous system. No biliary dilatation. Pancreas: Unremarkable. No pancreatic ductal dilatation or surrounding inflammatory changes. Spleen: Normal in size without focal abnormality. Adrenals/Urinary Tract: Normal appearance of the adrenal glands. 3 mm calcification in the right kidney is suggestive for a nonobstructive stone. Small exophytic cyst in the right kidney lower pole measures 1.0 cm. No hydronephrosis. Cyst involving the left kidney upper pole measures up to 5.9 cm. Probable left parapelvic renal cysts. Small cyst in left kidney lower pole. Normal appearance of the urinary bladder. Stomach/Bowel: Small focus of arterial extravasation in the rectosigmoid colon on sequence 7, image 87. There is additional contrast within the colon on the delayed images on sequence 13, image 52. Findings compatible with an area of active GI bleeding. Extensive diverticulosis involving the sigmoid colon. No other areas are suggestive for active GI bleeding. Normal appearance of the stomach. Lymphatic: No significant lymph node enlargement in the abdomen or pelvis. Reproductive: Prostate is unremarkable. Other: Negative for ascites.  Negative for free air. Musculoskeletal: Disc space narrowing at L5-S1. IMPRESSION: VASCULAR 1. Positive for active GI bleeding. Bleeding is located near the rectosigmoid junction. Etiology for the bleeding is uncertain but the patient does have significant diverticular disease in this area. 2. 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 Enlarged penetrating ulcer or ulcerative plaque along the left posterior aspect of the infrarenal abdominal aorta  measuring up to 1.0 cm. 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 3. Chronic stenosis involving the celiac trunk. NON-VASCULAR 1. Colonic diverticulosis. 2. Bilateral renal cysts. 3. Nonobstructive right kidney stone. These results were called by telephone at the time of interpretation on 06/20/2020 at  4:55 pm to provider Dr. Karle Starch, who verbally acknowledged these results. Electronically Signed   By: Markus Daft M.D.   On: 06/20/2020 17:14     Discharge Instructions: Discharge Instructions    Diet - low sodium heart healthy   Complete by: As directed    Discharge instructions   Complete by: As directed    You were hospitalized for gastrointestinal bleed. Thank you for allowing [place] to be part of your care.    Please follow up with the following providers: 1. Primary care provider in 1-2 weeks to recheck blood work  Please note these changes made to your medications:   - Medications to continue:  amLODipine (NORVASC) 5 MG tablet, Take 1 tablet (5 mg total) by mouth daily. atorvastatin (LIPITOR) 10 MG tablet, Take 10 mg by mouth daily. nitroGLYCERIN (NITROSTAT) 0.4 MG SL tablet, Place 0.4 mg under the tongue every 5 (five) minutes as needed for chest pain.  - Medications to start:  aspirin 81 MG chewable tablet, Chew 1 tablet (81 mg total) by mouth daily. (RESTART THIS MEDICATION ON 07/05/2020)  - Medications to discontinue:   NONE  Please make sure to AVOID NSAID MEDICATIONS (EX IBUPROFEN, MOTRIN, ETC.) and follow up with your PCP to recheck blood work.  Please call our clinic if you have any questions or concerns, we may be able to help and keep you from a long and expensive emergency room wait. Our clinic and after hours phone number is [phone], the best time to call is Monday through Friday 9 am to 4 pm but there is always someone available 24/7 if you have an emergency. If you need medication refills please notify your pharmacy one week in advance and they will  send [place] a request.   Increase activity slowly   Complete by: As directed    No wound care   Complete by: As directed       Signed:  Lawerance Cruel, D.O.  Internal Medicine Resident, PGY-2 [facility]  Pager: [phone] 11:01 AM, 06/23/2020

## 2020-06-23 NOTE — Progress Notes (Signed)
HD#2 Subjective:  Overnight Events: No events overnight  Patient resting comfortably in bed.  Denies any abdominal pain.  She states had at least one bowel movement since the flex sig and denies any blood.  Patient states that is anxious to go home and is ready for discharge soon as possible.  Objective:  Vital signs in last 24 hours: Vitals:   06/22/20 1950 06/23/20 0009 06/23/20 0337 06/23/20 0441  BP: 139/68 (!) 157/75 (!) 142/58   Pulse: 83 75 70   Resp: 18 17 17 16   Temp: 98.7 F (37.1 C) 98.3 F (36.8 C) 98 F (36.7 C)   TempSrc: Oral Oral Oral   SpO2: 100% 99% 99%   Weight:      Height:       Supplemental O2: Room Air SpO2: 99 % O2 Flow Rate (L/min): 2 L/min   Physical Exam:  Physical Exam Constitutional:      Appearance: Normal appearance.  HENT:     Head: Normocephalic and atraumatic.  Eyes:     Extraocular Movements: Extraocular movements intact.  Cardiovascular:     Rate and Rhythm: Normal rate.     Pulses: Normal pulses.     Heart sounds: Normal heart sounds.  Pulmonary:     Effort: Pulmonary effort is normal.     Breath sounds: Normal breath sounds.  Abdominal:     General: Bowel sounds are normal.     Palpations: Abdomen is soft.     Tenderness: There is no abdominal tenderness.  Musculoskeletal:        General: Normal range of motion.     Cervical back: Normal range of motion.     Right lower leg: No edema.     Left lower leg: No edema.  Skin:    General: Skin is warm and dry.  Neurological:     Mental Status: He is alert and oriented to person, place, and time. Mental status is at baseline.  Psychiatric:        Mood and Affect: Mood normal.     Filed Weights   06/20/20 1009 06/20/20 2313 06/21/20 1201  Weight: 47.6 kg 49.4 kg 49.4 kg     Intake/Output Summary (Last 24 hours) at 06/23/2020 1054 Last data filed at 06/22/2020 2000 Gross per 24 hour  Intake 1226.84 ml  Output 1200 ml  Net 26.84 ml   Net IO Since Admission:  779.01 mL [06/23/20 1054]  Pertinent Labs: CBC Latest Ref Rng & Units 06/23/2020 06/22/2020 06/22/2020  WBC 4.0 - 10.5 K/uL 6.1 - -  Hemoglobin 13.0 - 17.0 g/dL 8.6(L) 8.5(L) 8.4(L)  Hematocrit 39.0 - 52.0 % 26.7(L) 25.2(L) 26.0(L)  Platelets 150 - 400 K/uL 94(L) - -    CMP Latest Ref Rng & Units 06/21/2020 06/20/2020 06/20/2020  Glucose 70 - 99 mg/dL 88 86 114(H)  BUN 8 - 23 mg/dL 17 18 25(H)  Creatinine 0.61 - 1.24 mg/dL 0.89 0.90 1.10  Sodium 135 - 145 mmol/L 137 136 139  Potassium 3.5 - 5.1 mmol/L 3.7 4.1 4.5  Chloride 98 - 111 mmol/L 107 106 105  CO2 22 - 32 mmol/L 21(L) 22 24  Calcium 8.9 - 10.3 mg/dL 8.2(L) 7.9(L) 9.1  Total Protein 6.5 - 8.1 g/dL - 5.6(L) 6.7  Total Bilirubin 0.3 - 1.2 mg/dL - 0.4 0.6  Alkaline Phos 38 - 126 U/L - 60 75  AST 15 - 41 U/L - 26 31  ALT 0 - 44 U/L - 15 17  Imaging: No results found.  Assessment/Plan:   Active Problems:   Lower GI bleed   Abnormal CT scan, colon   Patient Summary: Dustin Howell is a 86 y.o. male with apertinent past medical history of GI bleed(4 years ago requiring ablation),CAD (s/p DES to OM1),presented to [facility] with a chief complaint of lower GI bleedadmitted for further medical management.  #Lower GI bleed: #Diverticulosis  Patient denies any abdominal pain and has not had any source of bleeding since his operation.  Patient stable for discharge today.   - Appreciate GI and IR's assistance - Advance diet per GI recommendations.  - Avoid NSAIDs for 2 weeks   #CAD s/p DESin 2018 - Holding ASA for 2 weeks  #Hypertension, hyperlipidemia - Continue atorvastatin,  -Continue amlodipine.  Diet:Liquid IVF:LR,125cc/hr YZJ:QDUK Code:Full  Dispo: Anticipated discharge toHomein1days pendingmedical management..    Dispo: Anticipated discharge to Home today.  Lawerance Cruel, D.O.  Internal Medicine Resident, PGY-2 [facility]  Pager: [phone] 10:54 AM,  06/23/2020   Please contact the on call pager after 5 pm and on weekends at [phone].

## 2020-07-20 ENCOUNTER — Inpatient Hospital Stay
Admission: EM | Admit: 2020-07-20 | Discharge: 2020-07-26 | DRG: 480 | Disposition: A | Discharge status: Skilled Nursing Facility | Payer: Medicare Other | Attending: Internal Medicine | Admitting: Internal Medicine

## 2020-07-20 ENCOUNTER — Other Ambulatory Visit: Payer: Self-pay

## 2020-07-20 ENCOUNTER — Emergency Department: Payer: Medicare Other

## 2020-07-20 ENCOUNTER — Encounter: Payer: Self-pay | Admitting: Emergency Medicine

## 2020-07-20 DIAGNOSIS — S41111A Laceration without foreign body of right upper arm, initial encounter: Secondary | ICD-10-CM | POA: Diagnosis present

## 2020-07-20 DIAGNOSIS — S72001A Fracture of unspecified part of neck of right femur, initial encounter for closed fracture: Secondary | ICD-10-CM

## 2020-07-20 DIAGNOSIS — I2510 Atherosclerotic heart disease of native coronary artery without angina pectoris: Secondary | ICD-10-CM | POA: Diagnosis present

## 2020-07-20 DIAGNOSIS — E785 Hyperlipidemia, unspecified: Secondary | ICD-10-CM | POA: Diagnosis present

## 2020-07-20 DIAGNOSIS — M47812 Spondylosis without myelopathy or radiculopathy, cervical region: Secondary | ICD-10-CM | POA: Diagnosis present

## 2020-07-20 DIAGNOSIS — S01311A Laceration without foreign body of right ear, initial encounter: Secondary | ICD-10-CM | POA: Diagnosis present

## 2020-07-20 DIAGNOSIS — D500 Iron deficiency anemia secondary to blood loss (chronic): Secondary | ICD-10-CM

## 2020-07-20 DIAGNOSIS — D479 Neoplasm of uncertain behavior of lymphoid, hematopoietic and related tissue, unspecified: Secondary | ICD-10-CM

## 2020-07-20 DIAGNOSIS — E43 Unspecified severe protein-calorie malnutrition: Secondary | ICD-10-CM | POA: Diagnosis present

## 2020-07-20 DIAGNOSIS — W010XXA Fall on same level from slipping, tripping and stumbling without subsequent striking against object, initial encounter: Secondary | ICD-10-CM | POA: Diagnosis present

## 2020-07-20 DIAGNOSIS — K219 Gastro-esophageal reflux disease without esophagitis: Secondary | ICD-10-CM | POA: Diagnosis present

## 2020-07-20 DIAGNOSIS — I129 Hypertensive chronic kidney disease with stage 1 through stage 4 chronic kidney disease, or unspecified chronic kidney disease: Secondary | ICD-10-CM | POA: Diagnosis present

## 2020-07-20 DIAGNOSIS — N182 Chronic kidney disease, stage 2 (mild): Secondary | ICD-10-CM

## 2020-07-20 DIAGNOSIS — S72141A Displaced intertrochanteric fracture of right femur, initial encounter for closed fracture: Secondary | ICD-10-CM | POA: Diagnosis present

## 2020-07-20 DIAGNOSIS — S0101XA Laceration without foreign body of scalp, initial encounter: Secondary | ICD-10-CM

## 2020-07-20 DIAGNOSIS — D6959 Other secondary thrombocytopenia: Secondary | ICD-10-CM | POA: Diagnosis present

## 2020-07-20 DIAGNOSIS — Z79899 Other long term (current) drug therapy: Secondary | ICD-10-CM

## 2020-07-20 DIAGNOSIS — F0390 Unspecified dementia without behavioral disturbance: Secondary | ICD-10-CM | POA: Diagnosis present

## 2020-07-20 DIAGNOSIS — S0990XA Unspecified injury of head, initial encounter: Secondary | ICD-10-CM

## 2020-07-20 DIAGNOSIS — W19XXXA Unspecified fall, initial encounter: Secondary | ICD-10-CM

## 2020-07-20 DIAGNOSIS — Y92008 Other place in unspecified non-institutional (private) residence as the place of occurrence of the external cause: Secondary | ICD-10-CM | POA: Diagnosis not present

## 2020-07-20 DIAGNOSIS — S0101XS Laceration without foreign body of scalp, sequela: Secondary | ICD-10-CM | POA: Diagnosis not present

## 2020-07-20 DIAGNOSIS — Z681 Body mass index (BMI) 19 or less, adult: Secondary | ICD-10-CM

## 2020-07-20 DIAGNOSIS — F17220 Nicotine dependence, chewing tobacco, uncomplicated: Secondary | ICD-10-CM | POA: Diagnosis present

## 2020-07-20 DIAGNOSIS — I10 Essential (primary) hypertension: Secondary | ICD-10-CM | POA: Diagnosis not present

## 2020-07-20 DIAGNOSIS — Z20822 Contact with and (suspected) exposure to covid-19: Secondary | ICD-10-CM | POA: Diagnosis present

## 2020-07-20 DIAGNOSIS — D72828 Other elevated white blood cell count: Secondary | ICD-10-CM | POA: Diagnosis present

## 2020-07-20 DIAGNOSIS — Z7982 Long term (current) use of aspirin: Secondary | ICD-10-CM

## 2020-07-20 DIAGNOSIS — D62 Acute posthemorrhagic anemia: Secondary | ICD-10-CM | POA: Diagnosis not present

## 2020-07-20 DIAGNOSIS — D696 Thrombocytopenia, unspecified: Secondary | ICD-10-CM

## 2020-07-20 DIAGNOSIS — Z955 Presence of coronary angioplasty implant and graft: Secondary | ICD-10-CM

## 2020-07-20 DIAGNOSIS — M80059A Age-related osteoporosis with current pathological fracture, unspecified femur, initial encounter for fracture: Secondary | ICD-10-CM

## 2020-07-20 DIAGNOSIS — T148XXA Other injury of unspecified body region, initial encounter: Secondary | ICD-10-CM

## 2020-07-20 DIAGNOSIS — G9341 Metabolic encephalopathy: Secondary | ICD-10-CM

## 2020-07-20 DIAGNOSIS — S72001S Fracture of unspecified part of neck of right femur, sequela: Secondary | ICD-10-CM | POA: Diagnosis not present

## 2020-07-20 LAB — URINALYSIS, ROUTINE W REFLEX MICROSCOPIC
Bilirubin Urine: NEGATIVE
Glucose, UA: NEGATIVE mg/dL
Hgb urine dipstick: NEGATIVE
Ketones, ur: NEGATIVE mg/dL
Leukocytes,Ua: NEGATIVE
Nitrite: NEGATIVE
Protein, ur: NEGATIVE mg/dL
Specific Gravity, Urine: 1.017 (ref 1.005–1.030)
pH: 5 (ref 5.0–8.0)

## 2020-07-20 LAB — CBC WITH DIFFERENTIAL/PLATELET
Abs Immature Granulocytes: 0.07 10*3/uL (ref 0.00–0.07)
Basophils Absolute: 0 10*3/uL (ref 0.0–0.1)
Basophils Relative: 0 %
Eosinophils Absolute: 0.1 10*3/uL (ref 0.0–0.5)
Eosinophils Relative: 1 %
HCT: 32.3 % — ABNORMAL LOW (ref 39.0–52.0)
Hemoglobin: 10.3 g/dL — ABNORMAL LOW (ref 13.0–17.0)
Immature Granulocytes: 1 %
Lymphocytes Relative: 27 %
Lymphs Abs: 2.4 10*3/uL (ref 0.7–4.0)
MCH: 30.6 pg (ref 26.0–34.0)
MCHC: 31.9 g/dL (ref 30.0–36.0)
MCV: 95.8 fL (ref 80.0–100.0)
Monocytes Absolute: 1.8 10*3/uL — ABNORMAL HIGH (ref 0.1–1.0)
Monocytes Relative: 20 %
Neutro Abs: 4.6 10*3/uL (ref 1.7–7.7)
Neutrophils Relative %: 51 %
Platelets: 127 10*3/uL — ABNORMAL LOW (ref 150–400)
RBC: 3.37 MIL/uL — ABNORMAL LOW (ref 4.22–5.81)
RDW: 17 % — ABNORMAL HIGH (ref 11.5–15.5)
Smear Review: NORMAL
WBC: 9 10*3/uL (ref 4.0–10.5)
nRBC: 0 % (ref 0.0–0.2)

## 2020-07-20 LAB — BASIC METABOLIC PANEL
Anion gap: 10 (ref 5–15)
BUN: 22 mg/dL (ref 8–23)
CO2: 25 mmol/L (ref 22–32)
Calcium: 9.1 mg/dL (ref 8.9–10.3)
Chloride: 102 mmol/L (ref 98–111)
Creatinine, Ser: 0.95 mg/dL (ref 0.61–1.24)
GFR, Estimated: >60 mL/min (ref >60)
Glucose, Bld: 121 mg/dL — ABNORMAL HIGH (ref 70–99)
Potassium: 3.9 mmol/L (ref 3.5–5.1)
Sodium: 137 mmol/L (ref 135–145)

## 2020-07-20 LAB — POC SARS CORONAVIRUS 2 AG -  ED: SARS Coronavirus 2 Ag: NEGATIVE

## 2020-07-20 LAB — PROTIME-INR
INR: 1 (ref 0.8–1.2)
Prothrombin Time: 13.1 seconds (ref 11.4–15.2)

## 2020-07-20 MED ORDER — NITROGLYCERIN 0.4 MG SL SUBL: 0.4000 mg | SUBLINGUAL_TABLET | SUBLINGUAL | Status: DC | PRN

## 2020-07-20 MED ORDER — HYDROCODONE-ACETAMINOPHEN 5-325 MG PO TABS: 1.0000 | ORAL_TABLET | Freq: Four times a day (QID) | ORAL | Status: DC | PRN

## 2020-07-20 MED ORDER — MORPHINE SULFATE (PF) 2 MG/ML IV SOLN
2.0000 mg | Freq: Once | INTRAVENOUS | Status: AC
Start: 2020-07-20 — End: 2020-07-20
  Administered 2020-07-20: 2 mg via INTRAVENOUS
  Filled 2020-07-20: qty 1

## 2020-07-20 MED ORDER — CLINDAMYCIN PHOSPHATE 600 MG/50ML IV SOLN
600.0000 mg | INTRAVENOUS | Status: DC
  Filled 2020-07-20 (×2): qty 50

## 2020-07-20 MED ORDER — BISACODYL 5 MG PO TBEC: 5.0000 mg | DELAYED_RELEASE_TABLET | Freq: Every day | ORAL | Status: DC | PRN

## 2020-07-20 MED ORDER — MAGNESIUM HYDROXIDE 400 MG/5ML PO SUSP: 30.0000 mL | Freq: Every day | ORAL | Status: DC | PRN

## 2020-07-20 MED ORDER — SODIUM CHLORIDE 0.9 % IV SOLN: INTRAVENOUS | Status: DC

## 2020-07-20 MED ORDER — CEFAZOLIN SODIUM-DEXTROSE 2-4 GM/100ML-% IV SOLN
2.0000 g | INTRAVENOUS | Status: DC
  Filled 2020-07-20 (×2): qty 100

## 2020-07-20 MED ORDER — AMLODIPINE BESYLATE 5 MG PO TABS
5.0000 mg | ORAL_TABLET | Freq: Every day | ORAL | Status: DC
  Administered 2020-07-22 – 2020-07-26 (×5): 5 mg via ORAL
  Filled 2020-07-20 (×5): qty 1

## 2020-07-20 MED ORDER — HYDROCODONE-ACETAMINOPHEN 5-325 MG PO TABS
1.0000 | ORAL_TABLET | ORAL | Status: DC | PRN
  Administered 2020-07-21 – 2020-07-25 (×7): 1 via ORAL
  Filled 2020-07-20 (×8): qty 1

## 2020-07-20 MED ORDER — MORPHINE SULFATE (PF) 2 MG/ML IV SOLN
0.5000 mg | INTRAVENOUS | Status: DC | PRN
  Administered 2020-07-20: 0.5 mg via INTRAVENOUS
  Filled 2020-07-20: qty 1

## 2020-07-20 MED ORDER — ATORVASTATIN CALCIUM 10 MG PO TABS
10.0000 mg | ORAL_TABLET | Freq: Every day | ORAL | Status: DC
  Administered 2020-07-22 – 2020-07-26 (×5): 10 mg via ORAL
  Filled 2020-07-20 (×5): qty 1

## 2020-07-20 MED ORDER — MORPHINE SULFATE (PF) 2 MG/ML IV SOLN
1.0000 mg | INTRAVENOUS | Status: DC | PRN
  Filled 2020-07-20: qty 1

## 2020-07-20 MED ORDER — CHLORHEXIDINE GLUCONATE 4 % EX LIQD: 1.0000 "application " | Freq: Once | CUTANEOUS | Status: DC

## 2020-07-20 NOTE — ED Notes (Signed)
Second contact with charge and charge RN for admission floor to resolve delay of pt to room

## 2020-07-20 NOTE — ED Notes (Signed)
Pt assisted with urinal by Amy RN and NT at this time. Pt did not have any urine output at this time.

## 2020-07-20 NOTE — H&P (Signed)
Chief Complaint: Brought in by EMS on account of status post fall with right hip pain  HPI: Dustin Howell is an 87 y.o. male.  At baseline, patient lives by self and is ADL independent.  He had been in his usual state of health until today when he tripped over a rug in his home.  He lost his balance and fell backwards.  He fell on his right hip and also bumped his head.  Post fall, patient was unable to get up due to pain and loss of function.  He was able to call family members with his CELL phone.  Patient has a significant past medical history of chronic anemia, recurrent lower GI bleeding with findings of AVMs and diverticulosis on flex sigmoidoscopy on his last admission 1 month ago.  Past Medical History:  Diagnosis Date  . GERD (gastroesophageal reflux disease)   . GI bleed 04/2016   presumed diverticular source, treated with embolization by IR  . HOH (hard of hearing)    right sided hearing aid  . Iron deficiency anemia 03/2018  . 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  low grade monocytosis, thrombocytopenia: likely low-grade lymphproliferative disorder.      Past Surgical History:  Procedure Laterality Date  . APPENDECTOMY    . bilat. rotator cuff issues Bilateral    "stiff" shoulders  . CORONARY STENT INTERVENTION N/A 04/21/2017   Procedure: CORONARY STENT INTERVENTION;  Surgeon: Isaias Cowman, MD;  Location: [facility];  Service: Cardiovascular;  Laterality: N/A;  . FLEXIBLE SIGMOIDOSCOPY N/A 06/21/2020   Procedure: FLEXIBLE SIGMOIDOSCOPY;  Surgeon: Ladene Artist, MD;  Location: [facility] ENDOSCOPY;  Service: Endoscopy;  Laterality: N/A;  . HOT HEMOSTASIS N/A 06/21/2020   Procedure: HOT HEMOSTASIS (ARGON PLASMA COAGULATION/BICAP);  Surgeon: Ladene Artist, MD;  Location: [facility] ENDOSCOPY;  Service: Endoscopy;  Laterality: N/A;  . IR ANGIOGRAM SELECTIVE EACH ADDITIONAL VESSEL  06/20/2020  . IR ANGIOGRAM VISCERAL SELECTIVE  06/20/2020   . IR ANGIOGRAM VISCERAL SELECTIVE  06/20/2020  . IR GENERIC HISTORICAL  05/12/2016   IR EMBO ART  VEN HEMORR LYMPH EXTRAV  INC GUIDE ROADMAPPING 05/12/2016 Corrie Mckusick, DO [facility]  . IR GENERIC HISTORICAL  05/12/2016   IR ANGIOGRAM VISCERAL SELECTIVE 05/12/2016 Corrie Mckusick, DO [facility]  . IR GENERIC HISTORICAL  05/12/2016   IR US GUIDE VASC ACCESS RIGHT 05/12/2016 Corrie Mckusick, DO [facility]  . IR GENERIC HISTORICAL  05/12/2016   IR ANGIOGRAM SELECTIVE EACH ADDITIONAL VESSEL 05/12/2016 Corrie Mckusick, DO [facility]  . IR GENERIC HISTORICAL  05/12/2016   IR ANGIOGRAM SELECTIVE EACH ADDITIONAL VESSEL 05/12/2016 Corrie Mckusick, DO [facility]  . IR GENERIC HISTORICAL  05/12/2016   IR ANGIOGRAM FOLLOW UP STUDY 05/12/2016 Corrie Mckusick, DO [facility]  . IR GENERIC HISTORICAL  05/12/2016   IR ANGIOGRAM SELECTIVE EACH ADDITIONAL VESSEL 05/12/2016 Corrie Mckusick, DO [facility]  . IR US GUIDE VASC ACCESS RIGHT  06/20/2020  . LEFT HEART CATH AND CORONARY ANGIOGRAPHY Left 04/21/2017   Procedure: LEFT HEART CATH AND CORONARY ANGIOGRAPHY;  Surgeon: Isaias Cowman, MD;  Location: [facility];  Service: Cardiovascular;  Laterality: Left;    Family History  Problem Relation Age of Onset  . Diabetes Mother   . Stroke Father   . Ovarian cancer Sister    Social History:  reports that he quit smoking about 65 years ago. His smoking use included cigarettes. He smoked 0.50 packs per day. His smokeless  tobacco use includes snuff. He reports current alcohol use. He reports that he does not use drugs.  Allergies: No Known Allergies  (Not in a hospital admission)   Results for orders placed or performed during the hospital encounter of 07/20/20 (from the past 48 hour(s))  CBC with Differential     Status: Abnormal   Collection Time: 07/20/20  1:06 PM  Result Value Ref Range   WBC 9.0 4.0 - 10.5 K/uL   RBC 3.37 (L) 4.22 - 5.81 MIL/uL   Hemoglobin 10.3 (L) 13.0 - 17.0  g/dL   HCT 32.3 (L) 39.0 - 52.0 %   MCV 95.8 80.0 - 100.0 fL   MCH 30.6 26.0 - 34.0 pg   MCHC 31.9 30.0 - 36.0 g/dL   RDW 17.0 (H) 11.5 - 15.5 %   Platelets 127 (L) 150 - 400 K/uL    Comment: Immature Platelet Fraction may be clinically indicated, consider ordering this additional test [id number]    nRBC 0.0 0.0 - 0.2 %   Neutrophils Relative % 51 %   Neutro Abs 4.6 1.7 - 7.7 K/uL   Lymphocytes Relative 27 %   Lymphs Abs 2.4 0.7 - 4.0 K/uL   Monocytes Relative 20 %   Monocytes Absolute 1.8 (H) 0.1 - 1.0 K/uL   Eosinophils Relative 1 %   Eosinophils Absolute 0.1 0.0 - 0.5 K/uL   Basophils Relative 0 %   Basophils Absolute 0.0 0.0 - 0.1 K/uL   RBC Morphology MORPHOLOGY UNREMARKABLE    Smear Review Normal platelet morphology    Immature Granulocytes 1 %   Abs Immature Granulocytes 0.07 0.00 - 0.07 K/uL    Comment: Performed at [facility], [address] [place], [place] [id number]  Basic metabolic panel     Status: Abnormal   Collection Time: 07/20/20  1:06 PM  Result Value Ref Range   Sodium 137 135 - 145 mmol/L   Potassium 3.9 3.5 - 5.1 mmol/L   Chloride 102 98 - 111 mmol/L   CO2 25 22 - 32 mmol/L   Glucose, Bld 121 (H) 70 - 99 mg/dL    Comment: Glucose reference range applies only to samples taken after fasting for at least 8 hours.   BUN 22 8 - 23 mg/dL   Creatinine, Ser 0.95 0.61 - 1.24 mg/dL   Calcium 9.1 8.9 - 10.3 mg/dL   GFR, Estimated >60 >60 mL/min    Comment: (NOTE) Calculated using the CKD-EPI Creatinine Equation (2021)    Anion gap 10 5 - 15    Comment: Performed at [facility], [address] [place], [place] [address]  Type and screen     Status: None   Collection Time: 07/20/20  1:09 PM  Result Value Ref Range   ABO/RH(D) O POS    Antibody Screen NEG    Sample Expiration      07/23/2020,2359 Performed at [facility], [address] [place], [place] [address]    DG Chest 1 View  Result Date: 07/20/2020 CLINICAL  DATA:  Fall EXAM: CHEST  1 VIEW COMPARISON:  May 13, 2016 FINDINGS: The cardiomediastinal silhouette is unchanged in contour.Atherosclerotic calcifications of the aorta. Biapical pleuroparenchymal scarring, unchanged. No pleural effusion. No pneumothorax. No acute pleuroparenchymal abnormality. Visualized abdomen is unremarkable. Multilevel degenerative changes of the thoracic spine. IMPRESSION: No acute cardiopulmonary abnormality. Electronically Signed   By: Valentino Saxon MD   On: 07/20/2020 13:01   CT Head Wo Contrast  Result Date: 07/20/2020 CLINICAL  DATA:  Trauma to the head and neck EXAM: CT HEAD WITHOUT CONTRAST CT CERVICAL SPINE WITHOUT CONTRAST TECHNIQUE: Multidetector CT imaging of the head and cervical spine was performed following the standard protocol without intravenous contrast. Multiplanar CT image reconstructions of the cervical spine were also generated. COMPARISON:  None. FINDINGS: CT HEAD FINDINGS Brain: Age related atrophy. Chronic small-vessel ischemic changes of the hemispheric white matter. No sign of acute infarction, mass lesion, hemorrhage, hydrocephalus or extra-axial collection. Vascular: There is atherosclerotic calcification of the major vessels at the base of the brain. Skull: No skull fracture. Sinuses/Orbits: Chronic inflammatory changes of the left division of the sphenoid sinus. Other sinuses clear. Orbits negative. Other: None CT CERVICAL SPINE FINDINGS Alignment: Normal Skull base and vertebrae: No fracture or primary bone lesion. Soft tissues and spinal canal: No significant soft tissue finding. Disc levels: Chronic degenerative spondylosis with endplate osteophytes and mild bulging of the discs from C2-3 through C6-7. Chronic facet fusion on the right at C3-4. Facet osteoarthritis on the right at C7-T1 and on the left at C2-3. Upper chest: Pleural and parenchymal scarring. Other: None IMPRESSION: HEAD CT: No acute or traumatic finding. Atrophy and chronic  small-vessel ischemic changes. CERVICAL SPINE CT: No acute or traumatic finding. Chronic degenerative spondylosis and facet osteoarthritis. Electronically Signed   By: Nelson Chimes M.D.   On: 07/20/2020 13:37   CT Cervical Spine Wo Contrast  Result Date: 07/20/2020 CLINICAL DATA:  Trauma to the head and neck EXAM: CT HEAD WITHOUT CONTRAST CT CERVICAL SPINE WITHOUT CONTRAST TECHNIQUE: Multidetector CT imaging of the head and cervical spine was performed following the standard protocol without intravenous contrast. Multiplanar CT image reconstructions of the cervical spine were also generated. COMPARISON:  None. FINDINGS: CT HEAD FINDINGS Brain: Age related atrophy. Chronic small-vessel ischemic changes of the hemispheric white matter. No sign of acute infarction, mass lesion, hemorrhage, hydrocephalus or extra-axial collection. Vascular: There is atherosclerotic calcification of the major vessels at the base of the brain. Skull: No skull fracture. Sinuses/Orbits: Chronic inflammatory changes of the left division of the sphenoid sinus. Other sinuses clear. Orbits negative. Other: None CT CERVICAL SPINE FINDINGS Alignment: Normal Skull base and vertebrae: No fracture or primary bone lesion. Soft tissues and spinal canal: No significant soft tissue finding. Disc levels: Chronic degenerative spondylosis with endplate osteophytes and mild bulging of the discs from C2-3 through C6-7. Chronic facet fusion on the right at C3-4. Facet osteoarthritis on the right at C7-T1 and on the left at C2-3. Upper chest: Pleural and parenchymal scarring. Other: None IMPRESSION: HEAD CT: No acute or traumatic finding. Atrophy and chronic small-vessel ischemic changes. CERVICAL SPINE CT: No acute or traumatic finding. Chronic degenerative spondylosis and facet osteoarthritis. Electronically Signed   By: Nelson Chimes M.D.   On: 07/20/2020 13:37   DG Hip Unilat W or Wo Pelvis 2-3 Views Right  Result Date: 07/20/2020 CLINICAL DATA:  Fall  EXAM: DG HIP (WITH OR WITHOUT PELVIS) 2-3V RIGHT COMPARISON:  None. FINDINGS: Osteopenia. There is a comminuted inter trochanteric fracture. The lesser trochanter is mildly displaced. Fracture extends through the greater trochanter. The femoral head appears to be seated within the acetabulum. There are mild degenerative changes of the femoral head. Evaluation of the sacrum is limited secondary to overlying bowel gas. Pelvic phleboliths. No definitive additional fracture noted. Vascular calcifications. IMPRESSION: Comminuted right intertrochanteric fracture with mild displacement of the lesser trochanter. Electronically Signed   By: Valentino Saxon MD   On: 07/20/2020 13:00  Review of Systems  Constitutional: Negative.   HENT: Negative.   Gastrointestinal: Negative.  Negative for blood in stool.  Endocrine: Negative.   Neurological: Negative.   Hematological: Negative.   Psychiatric/Behavioral: Negative.   All other systems reviewed and are negative.   Blood pressure 133/75, pulse 84, temperature 98 F (36.7 C), temperature source Oral, resp. rate 15, height 5\' 7"  (1.702 m), weight 47.6 kg, SpO2 99 %. Physical Exam Constitutional:      Appearance: Normal appearance.  HENT:     Head:     Comments: Right sided scalp laceration    Nose: Nose normal.  Eyes:     Extraocular Movements: Extraocular movements intact.  Cardiovascular:     Rate and Rhythm: Normal rate and regular rhythm.     Pulses: Normal pulses.     Heart sounds: Murmur heard.    Pulmonary:     Effort: Pulmonary effort is normal.     Breath sounds: Normal breath sounds.  Abdominal:     General: Bowel sounds are normal.     Palpations: Abdomen is soft.  Musculoskeletal:        General: Deformity present.     Cervical back: Normal range of motion and neck supple.  Skin:    General: Skin is warm and dry.  Neurological:     General: No focal deficit present.     Mental Status: He is alert and oriented to person,  place, and time. Mental status is at baseline.      Assessment/Plan: Acute closed comminuted right intertrochanteric fracture secondary to mechanical fall.  Patient denies any associated syncopal event.  Patient be admitted for further evaluation by orthopedics for possible open reduction and internal fixation.  Patient denies any chest pain at this time.  No history of coronary disease status post drug-eluting stent in 2018.  Underlying osteopenia may be contributory.  Pain will be optimized with analgesics.  History of recurrent GI bleeding from AVMs and diverticulosis.  No active bleeding reported on this admission.  Hemoglobin remained stable.  Patient was medically not a candidate for aspirin/NSAIDS or anticoagulation at this time due to severe risk of GI bleed.Marland Kitchen  CAD s/p DESin 2018-aspirin on hold.  Asymptomatic at this time.  Continue with statins.  Considering no acute symptoms of chest pain or recent change in exercise tolerance, patient may be moderate risk for procedure.  If needed, cardiology input may be warranted for peri-op risk assessment. .  Hypertension, hyperlipidemia- Continue amlodipine and statins respectively.  Small scalp laceration secondary to trauma and fall.  CT scan of the brain ruled out any intracranial bleeding or ischemia.  Chronic cervical spine spondylosis noted.  COVID-19 test pending.  Patient's daughter at bedside was counseled about anticipated procedure and treatment.  Artist [place], MD 07/20/2020, 3:52 PM

## 2020-07-20 NOTE — Consult Note (Signed)
ORTHOPAEDIC CONSULTATION  REQUESTING PHYSICIAN: Acheampong, Warnell Bureau, MD  Chief Complaint: Right hip pain  HPI: Dustin Howell is a 87 y.o. male who complains of right hip pain after a fall at home today. He tripped on a rug and fell and was unable to get up. He was brought to the emergency room where exam and x-rays show a comminuted displaced intertrochanteric fracture of the right hip. His daughter is present at the bedside with him in the emergency room. I explained the fracture to them and recommended surgical fixation once if cleared by medical service. Risk and benefits of surgery versus nonoperative treatment were discussed in the postop protocol as well. At the request we will schedule him for surgery tomorrow morning. He had a recent GI bleed apparently and it is felt unsafe to give him anticoagulants according to his daughter.  Past Medical History:  Diagnosis Date  . GERD (gastroesophageal reflux disease)   . GI bleed 04/2016   presumed diverticular source, treated with embolization by IR  . HOH (hard of hearing)    right sided hearing aid  . Iron deficiency anemia 03/2018  . 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  low grade monocytosis, thrombocytopenia: likely low-grade lymphproliferative disorder.     Past Surgical History:  Procedure Laterality Date  . APPENDECTOMY    . bilat. rotator cuff issues Bilateral    "stiff" shoulders  . CORONARY STENT INTERVENTION N/A 04/21/2017   Procedure: CORONARY STENT INTERVENTION;  Surgeon: Isaias Cowman, MD;  Location: [facility];  Service: Cardiovascular;  Laterality: N/A;  . FLEXIBLE SIGMOIDOSCOPY N/A 06/21/2020   Procedure: FLEXIBLE SIGMOIDOSCOPY;  Surgeon: Ladene Artist, MD;  Location: [facility] ENDOSCOPY;  Service: Endoscopy;  Laterality: N/A;  . HOT HEMOSTASIS N/A 06/21/2020   Procedure: HOT HEMOSTASIS (ARGON PLASMA COAGULATION/BICAP);  Surgeon: Ladene Artist, MD;  Location: [facility]  ENDOSCOPY;  Service: Endoscopy;  Laterality: N/A;  . IR ANGIOGRAM SELECTIVE EACH ADDITIONAL VESSEL  06/20/2020  . IR ANGIOGRAM VISCERAL SELECTIVE  06/20/2020  . IR ANGIOGRAM VISCERAL SELECTIVE  06/20/2020  . IR GENERIC HISTORICAL  05/12/2016   IR EMBO ART  VEN HEMORR LYMPH EXTRAV  INC GUIDE ROADMAPPING 05/12/2016 Corrie Mckusick, DO [facility]  . IR GENERIC HISTORICAL  05/12/2016   IR ANGIOGRAM VISCERAL SELECTIVE 05/12/2016 Corrie Mckusick, DO [facility]  . IR GENERIC HISTORICAL  05/12/2016   IR US GUIDE VASC ACCESS RIGHT 05/12/2016 Corrie Mckusick, DO [facility]  . IR GENERIC HISTORICAL  05/12/2016   IR ANGIOGRAM SELECTIVE EACH ADDITIONAL VESSEL 05/12/2016 Corrie Mckusick, DO [facility]  . IR GENERIC HISTORICAL  05/12/2016   IR ANGIOGRAM SELECTIVE EACH ADDITIONAL VESSEL 05/12/2016 Corrie Mckusick, DO [facility]  . IR GENERIC HISTORICAL  05/12/2016   IR ANGIOGRAM FOLLOW UP STUDY 05/12/2016 Corrie Mckusick, DO [facility]  . IR GENERIC HISTORICAL  05/12/2016   IR ANGIOGRAM SELECTIVE EACH ADDITIONAL VESSEL 05/12/2016 Corrie Mckusick, DO [facility]  . IR US GUIDE VASC ACCESS RIGHT  06/20/2020  . LEFT HEART CATH AND CORONARY ANGIOGRAPHY Left 04/21/2017   Procedure: LEFT HEART CATH AND CORONARY ANGIOGRAPHY;  Surgeon: Isaias Cowman, MD;  Location: [facility];  Service: Cardiovascular;  Laterality: Left;   Social History   Socioeconomic History  . Marital status: Widowed    Spouse name: Not on file  . Number of children: Not on file  . Years of education: Not on file  . Highest education level: Not on file  Occupational History  . Not on file  Tobacco Use  . Smoking status: Former Smoker    Packs/day: 0.50    Types: Cigarettes    Quit date: 1957    Years since quitting: 65.0  . Smokeless tobacco: Current User    Types: Snuff  Substance and Sexual Activity  . Alcohol use: Yes    Comment: every evening with supper  . Drug use: No  . Sexual activity: Never  Other Topics  Concern  . Not on file  Social History Narrative  . Not on file   Social Determinants of Health   Financial Resource Strain: Not on file  Food Insecurity: Not on file  Transportation Needs: Not on file  Physical Activity: Not on file  Stress: Not on file  Social Connections: Not on file   Family History  Problem Relation Age of Onset  . Diabetes Mother   . Stroke Father   . Ovarian cancer Sister    No Known Allergies Prior to Admission medications   Medication Sig Start Date End Date Taking? Authorizing Provider  amLODipine (NORVASC) 5 MG tablet Take 1 tablet (5 mg total) by mouth daily. 05/20/16  Yes Osvaldo ShipperKrishnan, Gokul, MD  aspirin 81 MG chewable tablet Chew 1 tablet (81 mg total) by mouth daily. 07/05/20  Yes Dellia Cloudoe, Benjamin, MD  atorvastatin (LIPITOR) 10 MG tablet Take 10 mg by mouth daily.   Yes [provider]  nitroGLYCERIN (NITROSTAT) 0.4 MG SL tablet Place 0.4 mg under the tongue every 5 (five) minutes as needed for chest pain.   Yes [provider]   DG Chest 1 View  Result Date: 07/20/2020 CLINICAL DATA:  Fall EXAM: CHEST  1 VIEW COMPARISON:  May 13, 2016 FINDINGS: The cardiomediastinal silhouette is unchanged in contour.Atherosclerotic calcifications of the aorta. Biapical pleuroparenchymal scarring, unchanged. No pleural effusion. No pneumothorax. No acute pleuroparenchymal abnormality. Visualized abdomen is unremarkable. Multilevel degenerative changes of the thoracic spine. IMPRESSION: No acute cardiopulmonary abnormality. Electronically Signed   By: Meda KlinefelterStephanie  Peacock MD   On: 07/20/2020 13:01   CT Head Wo Contrast  Result Date: 07/20/2020 CLINICAL DATA:  Trauma to the head and neck EXAM: CT HEAD WITHOUT CONTRAST CT CERVICAL SPINE WITHOUT CONTRAST TECHNIQUE: Multidetector CT imaging of the head and cervical spine was performed following the standard protocol without intravenous contrast. Multiplanar CT image reconstructions of the cervical spine were  also generated. COMPARISON:  None. FINDINGS: CT HEAD FINDINGS Brain: Age related atrophy. Chronic small-vessel ischemic changes of the hemispheric white matter. No sign of acute infarction, mass lesion, hemorrhage, hydrocephalus or extra-axial collection. Vascular: There is atherosclerotic calcification of the major vessels at the base of the brain. Skull: No skull fracture. Sinuses/Orbits: Chronic inflammatory changes of the left division of the sphenoid sinus. Other sinuses clear. Orbits negative. Other: None CT CERVICAL SPINE FINDINGS Alignment: Normal Skull base and vertebrae: No fracture or primary bone lesion. Soft tissues and spinal canal: No significant soft tissue finding. Disc levels: Chronic degenerative spondylosis with endplate osteophytes and mild bulging of the discs from C2-3 through C6-7. Chronic facet fusion on the right at C3-4. Facet osteoarthritis on the right at C7-T1 and on the left at C2-3. Upper chest: Pleural and parenchymal scarring. Other: None IMPRESSION: HEAD CT: No acute or traumatic finding. Atrophy and chronic small-vessel ischemic changes. CERVICAL SPINE CT: No acute or traumatic finding. Chronic degenerative spondylosis and facet osteoarthritis. Electronically Signed   By: Paulina FusiMark  Shogry M.D.   On: 07/20/2020 13:37  CT Cervical Spine Wo Contrast  Result Date: 07/20/2020 CLINICAL DATA:  Trauma to the head and neck EXAM: CT HEAD WITHOUT CONTRAST CT CERVICAL SPINE WITHOUT CONTRAST TECHNIQUE: Multidetector CT imaging of the head and cervical spine was performed following the standard protocol without intravenous contrast. Multiplanar CT image reconstructions of the cervical spine were also generated. COMPARISON:  None. FINDINGS: CT HEAD FINDINGS Brain: Age related atrophy. Chronic small-vessel ischemic changes of the hemispheric white matter. No sign of acute infarction, mass lesion, hemorrhage, hydrocephalus or extra-axial collection. Vascular: There is atherosclerotic calcification  of the major vessels at the base of the brain. Skull: No skull fracture. Sinuses/Orbits: Chronic inflammatory changes of the left division of the sphenoid sinus. Other sinuses clear. Orbits negative. Other: None CT CERVICAL SPINE FINDINGS Alignment: Normal Skull base and vertebrae: No fracture or primary bone lesion. Soft tissues and spinal canal: No significant soft tissue finding. Disc levels: Chronic degenerative spondylosis with endplate osteophytes and mild bulging of the discs from C2-3 through C6-7. Chronic facet fusion on the right at C3-4. Facet osteoarthritis on the right at C7-T1 and on the left at C2-3. Upper chest: Pleural and parenchymal scarring. Other: None IMPRESSION: HEAD CT: No acute or traumatic finding. Atrophy and chronic small-vessel ischemic changes. CERVICAL SPINE CT: No acute or traumatic finding. Chronic degenerative spondylosis and facet osteoarthritis. Electronically Signed   By: Nelson Chimes M.D.   On: 07/20/2020 13:37   DG Hip Unilat W or Wo Pelvis 2-3 Views Right  Result Date: 07/20/2020 CLINICAL DATA:  Fall EXAM: DG HIP (WITH OR WITHOUT PELVIS) 2-3V RIGHT COMPARISON:  None. FINDINGS: Osteopenia. There is a comminuted inter trochanteric fracture. The lesser trochanter is mildly displaced. Fracture extends through the greater trochanter. The femoral head appears to be seated within the acetabulum. There are mild degenerative changes of the femoral head. Evaluation of the sacrum is limited secondary to overlying bowel gas. Pelvic phleboliths. No definitive additional fracture noted. Vascular calcifications. IMPRESSION: Comminuted right intertrochanteric fracture with mild displacement of the lesser trochanter. Electronically Signed   By: Valentino Saxon MD   On: 07/20/2020 13:00    Positive ROS: All other systems have been reviewed and were otherwise negative with the exception of those mentioned in the HPI and as above.  Physical Exam: General: Alert, no acute  distress Cardiovascular: No pedal edema Respiratory: No cyanosis, no use of accessory musculature GI: No organomegaly, abdomen is soft and non-tender Skin: No lesions in the area of chief complaint Neurologic: Sensation intact distally Psychiatric: Patient is competent for consent with normal mood and affect Lymphatic: No axillary or cervical lymphadenopathy  MUSCULOSKELETAL: The patient is a thin male lying quietly on a stretcher. He is alert and cooperative and fully oriented. The right leg is flexed and asked and shortened and externally rotated. Neurovascular status is good distally. Other extremities are normal other than a small laceration above the right elbow. This is bandaged. No other significant injuries are noted.  Assessment: Comminuted displaced intertrochanteric fracture right hip  Plan: Right hip nailing tomorrow morning    Park Breed, MD [phone]   07/20/2020 6:19 PM

## 2020-07-20 NOTE — ED Provider Notes (Signed)
[facility] Emergency Department Provider Note  ____________________________________________  Time seen: Approximately 1:13 PM  I have reviewed the triage vital signs and the nursing notes.   HISTORY  Chief Complaint Fall    HPI Dustin Howell is a 87 y.o. male who presents the emergency department complaining of right hip pain status post a fall.  Patient states that he was backing up, tripped fell backwards this morning.  Patient did hit his head during this fall,  striking his head on a coffee table.  Patient sustained a small laceration behind the right ear with good cessation of bleeding prior to arrival.  Patient sustained a skin tear to the right upper arm and is complaining of right leg pain.  Patient was unable to move the right hip or put any weight on the right lower extremity following this fall.  No history of previous injuries to the right lower extremity.  Patient denies any blood thinner use.  He denies any loss of consciousness.  He has a slight headache at the site of injury to the right parietal skull.  There is no significant headache, visual changes.  No describe weakness in the unilateral sides.  Patient denies any neck pain, chest pain, shortness of breath.  No abdominal pain.  EMS reports that they found the patient with a shortened and externally rotated right lower leg.  No alleviating measures prior to arrival.  Medical history as described below with history of lower GI bleed, coronary artery disease, GERD, anemia lymphoproliferative disorder.        Past Medical History:  Diagnosis Date  . GERD (gastroesophageal reflux disease)   . GI bleed 04/2016   presumed diverticular source, treated with embolization by IR  . HOH (hard of hearing)    right sided hearing aid  . Iron deficiency anemia 03/2018  . Lymphoproliferative disorder (HCC) 03/2018   heme: Dr Smith Robertao in [place].  low grade monocytosis, thrombocytopenia: likely low-grade  lymphproliferative disorder.      Patient Active Problem List   Diagnosis Date Noted  . Hip fracture due to osteoporosis, initial encounter (HCC) 07/20/2020  . Abnormal CT scan, colon   . Lower GI bleed 06/20/2020  . CAD (coronary artery disease) 04/27/2017  . 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 Chest pain on exertion 04/21/2017  . Abnormal CT of the abdomen   . Acute encephalopathy   . Hypokalemia   . Ileus (HCC)   . Abdominal pain   . Acute lower GI bleeding 05/11/2016  . Gastrointestinal hemorrhage 05/11/2016  . Acute blood loss anemia   . Arterial hypotension   . Acute pain of left knee   . Slurred speech     Past Surgical History:  Procedure Laterality Date  . APPENDECTOMY    . bilat. rotator cuff issues Bilateral    "stiff" shoulders  . CORONARY STENT INTERVENTION N/A 04/21/2017   Procedure: CORONARY STENT INTERVENTION;  Surgeon: Marcina MillardParaschos, Alexander, MD;  Location: [facility] INVASIVE CV LAB;  Service: Cardiovascular;  Laterality: N/A;  . FLEXIBLE SIGMOIDOSCOPY N/A 06/21/2020   Procedure: FLEXIBLE SIGMOIDOSCOPY;  Surgeon: Meryl DareStark, Malcolm T, MD;  [facility];  Service: Endoscopy;  Laterality: N/A;  . HOT HEMOSTASIS N/A 06/21/2020   Procedure: HOT HEMOSTASIS (ARGON PLASMA COAGULATION/BICAP);  Surgeon: Meryl DareStark, Malcolm T, MD;  [facility];  Service: Endoscopy;  Laterality: N/A;  . IR ANGIOGRAM SELECTIVE EACH ADDITIONAL VESSEL  06/20/2020  . IR ANGIOGRAM VISCERAL SELECTIVE  06/20/2020  . IR ANGIOGRAM  VISCERAL SELECTIVE  06/20/2020  . IR GENERIC HISTORICAL  05/12/2016   IR EMBO ART  VEN HEMORR LYMPH EXTRAV  INC GUIDE ROADMAPPING 05/12/2016 Corrie Mckusick, DO [facility]  . IR GENERIC HISTORICAL  05/12/2016   IR ANGIOGRAM VISCERAL SELECTIVE 05/12/2016 Corrie Mckusick, DO [facility]  . IR GENERIC HISTORICAL  05/12/2016   IR US GUIDE VASC ACCESS RIGHT 05/12/2016 Corrie Mckusick, DO [facility]  . IR GENERIC HISTORICAL  05/12/2016   IR ANGIOGRAM  SELECTIVE EACH ADDITIONAL VESSEL 05/12/2016 Corrie Mckusick, DO [facility]  . IR GENERIC HISTORICAL  05/12/2016   IR ANGIOGRAM SELECTIVE EACH ADDITIONAL VESSEL 05/12/2016 Corrie Mckusick, DO [facility]  . IR GENERIC HISTORICAL  05/12/2016   IR ANGIOGRAM FOLLOW UP STUDY 05/12/2016 Corrie Mckusick, DO [facility]  . IR GENERIC HISTORICAL  05/12/2016   IR ANGIOGRAM SELECTIVE EACH ADDITIONAL VESSEL 05/12/2016 Corrie Mckusick, DO [facility]  . IR US GUIDE VASC ACCESS RIGHT  06/20/2020  . LEFT HEART CATH AND CORONARY ANGIOGRAPHY Left 04/21/2017   Procedure: LEFT HEART CATH AND CORONARY ANGIOGRAPHY;  Surgeon: Isaias Cowman, MD;  Location: [facility];  Service: Cardiovascular;  Laterality: Left;    Prior to Admission medications   Medication Sig Start Date End Date Taking? Authorizing Provider  amLODipine (NORVASC) 5 MG tablet Take 1 tablet (5 mg total) by mouth daily. 05/20/16  Yes Bonnielee Haff, MD  aspirin 81 MG chewable tablet Chew 1 tablet (81 mg total) by mouth daily. 07/05/20  Yes Marianna Payment, MD  atorvastatin (LIPITOR) 10 MG tablet Take 10 mg by mouth daily.   Yes [provider]  nitroGLYCERIN (NITROSTAT) 0.4 MG SL tablet Place 0.4 mg under the tongue every 5 (five) minutes as needed for chest pain.   Yes [provider]    Allergies Patient has no known allergies.  Family History  Problem Relation Age of Onset  . Diabetes Mother   . Stroke Father   . Ovarian cancer Sister     Social History Social History   Tobacco Use  . Smoking status: Former Smoker    Packs/day: 0.50    Types: Cigarettes    Quit date: 1957    Years since quitting: 65.0  . Smokeless tobacco: Current User    Types: Snuff  Substance Use Topics  . Alcohol use: Yes    Comment: every evening with supper  . Drug use: No     Review of Systems  Constitutional: No fever/chills.  Positive for mechanical fall Eyes: No visual changes. No discharge ENT: No upper  respiratory complaints. Cardiovascular: no chest pain. Respiratory: no cough. No SOB. Gastrointestinal: No abdominal pain.  No nausea, no vomiting.  No diarrhea.  No constipation. Genitourinary: Negative for dysuria. No hematuria Musculoskeletal: Right hip pain Skin: Negative for rash, abrasions, lacerations, ecchymosis. Neurological: Right parietal skull pain/localized headache but denies generalized headaches, focal weakness or numbness.  10 System ROS otherwise negative.  ____________________________________________   PHYSICAL EXAM:  VITAL SIGNS: ED Triage Vitals  Enc Vitals Group     BP 07/20/20 1223 (!) 160/65     Pulse Rate 07/20/20 1223 86     Resp 07/20/20 1223 16     Temp 07/20/20 1223 98 F (36.7 C)     Temp Source 07/20/20 1223 Oral     SpO2 07/20/20 1223 100 %     Weight 07/20/20 1227 105 lb (47.6 kg)     Height 07/20/20 1227 5\' 7"  (1.702 m)  Head Circumference --      Peak Flow --      Pain Score --      Pain Loc --      Pain Edu? --      Excl. in [place]? --      Constitutional: Alert and oriented. Well appearing and in no acute distress. Eyes: Conjunctivae are normal. PERRL. EOMI. Head: Hematoma with very superficial laceration noted to the right parietal skull posterior to the right ear.  No trauma to the external ear.  Patient has scabbing in place.  No active bleeding.  Area is slightly tender to palpation.  No crepitus.  No evidence of hematoma or ecchymosis behind the left ear.  No raccoon eyes.  No serosanguineous fluid drainage from the ears or nares. ENT:      Ears:       Nose: No congestion/rhinnorhea.      Mouth/Throat: Mucous membranes are moist.  Neck: No stridor.  No cervical spine tenderness to palpation  Cardiovascular: Normal rate, regular rhythm. Normal S1 and S2.  Good peripheral circulation. Respiratory: Normal respiratory effort without tachypnea or retractions. Lungs CTAB. Good air entry to the bases with no decreased or absent breath  sounds. Musculoskeletal: Visualization of the right lower extremity reveals shortening and rotation with external rotation of the right lower extremity.  No obvious deformity identified.  No range of motion currently.  Patient is very tender to palpation along the trochanter region extending into the mid femur.  No gross palpable abnormality.  No range of motion performed at this time.  Examination of the right knee, lower extremity is unremarkable.  Dorsalis pedis pulse intact distally.  Sensation intact distally. Neurologic:  Normal speech and language. No gross focal neurologic deficits are appreciated.  Cranial nerves II through XII grossly intact. Skin:  Skin is warm, dry and intact. No rash noted. Psychiatric: Mood and affect are normal. Speech and behavior are normal. Patient exhibits appropriate insight and judgement.   ____________________________________________   LABS (all labs ordered are listed, but only abnormal results are displayed)  Labs Reviewed  CBC WITH DIFFERENTIAL/PLATELET - Abnormal; Notable for the following components:      Result Value   RBC 3.37 (*)    Hemoglobin 10.3 (*)    HCT 32.3 (*)    RDW 17.0 (*)    Platelets 127 (*)    Monocytes Absolute 1.8 (*)    All other components within normal limits  BASIC METABOLIC PANEL - Abnormal; Notable for the following components:   Glucose, Bld 121 (*)    All other components within normal limits  SARS CORONAVIRUS 2 (TAT 6-24 HRS)  PROTIME-INR  URINALYSIS, ROUTINE W REFLEX MICROSCOPIC  CBC  BASIC METABOLIC PANEL  TYPE AND SCREEN   ____________________________________________  EKG   ____________________________________________  RADIOLOGY I personally viewed and evaluated these images as part of my medical decision making, as well as reviewing the written report by the radiologist.  ED Provider Interpretation: I concur with radiologist finding of comminuted intertrochanteric fracture right side with mild bowing  displacement of the lesser trochanter.  No other significant traumatic findings on imaging.  Chest x-ray without acute cardiopulmonary abnormality.  CT head and cervical spine without acute fracture or intracranial hemorrhage.  DG Chest 1 View  Result Date: 07/20/2020 CLINICAL DATA:  Fall EXAM: CHEST  1 VIEW COMPARISON:  May 13, 2016 FINDINGS: The cardiomediastinal silhouette is unchanged in contour.Atherosclerotic calcifications of the aorta. Biapical pleuroparenchymal scarring, unchanged. No pleural effusion.  No pneumothorax. No acute pleuroparenchymal abnormality. Visualized abdomen is unremarkable. Multilevel degenerative changes of the thoracic spine. IMPRESSION: No acute cardiopulmonary abnormality. Electronically Signed   By: Valentino Saxon MD   On: 07/20/2020 13:01   CT Head Wo Contrast  Result Date: 07/20/2020 CLINICAL DATA:  Trauma to the head and neck EXAM: CT HEAD WITHOUT CONTRAST CT CERVICAL SPINE WITHOUT CONTRAST TECHNIQUE: Multidetector CT imaging of the head and cervical spine was performed following the standard protocol without intravenous contrast. Multiplanar CT image reconstructions of the cervical spine were also generated. COMPARISON:  None. FINDINGS: CT HEAD FINDINGS Brain: Age related atrophy. Chronic small-vessel ischemic changes of the hemispheric white matter. No sign of acute infarction, mass lesion, hemorrhage, hydrocephalus or extra-axial collection. Vascular: There is atherosclerotic calcification of the major vessels at the base of the brain. Skull: No skull fracture. Sinuses/Orbits: Chronic inflammatory changes of the left division of the sphenoid sinus. Other sinuses clear. Orbits negative. Other: None CT CERVICAL SPINE FINDINGS Alignment: Normal Skull base and vertebrae: No fracture or primary bone lesion. Soft tissues and spinal canal: No significant soft tissue finding. Disc levels: Chronic degenerative spondylosis with endplate osteophytes and mild bulging of the  discs from C2-3 through C6-7. Chronic facet fusion on the right at C3-4. Facet osteoarthritis on the right at C7-T1 and on the left at C2-3. Upper chest: Pleural and parenchymal scarring. Other: None IMPRESSION: HEAD CT: No acute or traumatic finding. Atrophy and chronic small-vessel ischemic changes. CERVICAL SPINE CT: No acute or traumatic finding. Chronic degenerative spondylosis and facet osteoarthritis. Electronically Signed   By: Nelson Chimes M.D.   On: 07/20/2020 13:37   CT Cervical Spine Wo Contrast  Result Date: 07/20/2020 CLINICAL DATA:  Trauma to the head and neck EXAM: CT HEAD WITHOUT CONTRAST CT CERVICAL SPINE WITHOUT CONTRAST TECHNIQUE: Multidetector CT imaging of the head and cervical spine was performed following the standard protocol without intravenous contrast. Multiplanar CT image reconstructions of the cervical spine were also generated. COMPARISON:  None. FINDINGS: CT HEAD FINDINGS Brain: Age related atrophy. Chronic small-vessel ischemic changes of the hemispheric white matter. No sign of acute infarction, mass lesion, hemorrhage, hydrocephalus or extra-axial collection. Vascular: There is atherosclerotic calcification of the major vessels at the base of the brain. Skull: No skull fracture. Sinuses/Orbits: Chronic inflammatory changes of the left division of the sphenoid sinus. Other sinuses clear. Orbits negative. Other: None CT CERVICAL SPINE FINDINGS Alignment: Normal Skull base and vertebrae: No fracture or primary bone lesion. Soft tissues and spinal canal: No significant soft tissue finding. Disc levels: Chronic degenerative spondylosis with endplate osteophytes and mild bulging of the discs from C2-3 through C6-7. Chronic facet fusion on the right at C3-4. Facet osteoarthritis on the right at C7-T1 and on the left at C2-3. Upper chest: Pleural and parenchymal scarring. Other: None IMPRESSION: HEAD CT: No acute or traumatic finding. Atrophy and chronic small-vessel ischemic changes.  CERVICAL SPINE CT: No acute or traumatic finding. Chronic degenerative spondylosis and facet osteoarthritis. Electronically Signed   By: Nelson Chimes M.D.   On: 07/20/2020 13:37   DG Hip Unilat W or Wo Pelvis 2-3 Views Right  Result Date: 07/20/2020 CLINICAL DATA:  Fall EXAM: DG HIP (WITH OR WITHOUT PELVIS) 2-3V RIGHT COMPARISON:  None. FINDINGS: Osteopenia. There is a comminuted inter trochanteric fracture. The lesser trochanter is mildly displaced. Fracture extends through the greater trochanter. The femoral head appears to be seated within the acetabulum. There are mild degenerative changes of the femoral head. Evaluation  of the sacrum is limited secondary to overlying bowel gas. Pelvic phleboliths. No definitive additional fracture noted. Vascular calcifications. IMPRESSION: Comminuted right intertrochanteric fracture with mild displacement of the lesser trochanter. Electronically Signed   By: Meda Klinefelter MD   On: 07/20/2020 13:00    ____________________________________________    PROCEDURES  Procedure(s) performed:    Procedures    Medications  0.9 %  sodium chloride infusion ( Intravenous New Bag/Given 07/20/20 1530)  ceFAZolin (ANCEF) IVPB 2g/100 mL premix (has no administration in time range)  chlorhexidine (HIBICLENS) 4 % liquid 1 application (has no administration in time range)  clindamycin (CLEOCIN) IVPB 600 mg (has no administration in time range)  HYDROcodone-acetaminophen (NORCO/VICODIN) 5-325 MG per tablet 1 tablet (has no administration in time range)  morphine 2 MG/ML injection 1 mg (has no administration in time range)  amLODipine (NORVASC) tablet 5 mg (has no administration in time range)  atorvastatin (LIPITOR) tablet 10 mg (has no administration in time range)  nitroGLYCERIN (NITROSTAT) SL tablet 0.4 mg (has no administration in time range)  HYDROcodone-acetaminophen (NORCO/VICODIN) 5-325 MG per tablet 1-2 tablet (has no administration in time range)  morphine  2 MG/ML injection 0.5 mg (has no administration in time range)  0.9 %  sodium chloride infusion ( Intravenous Not Given 07/20/20 1651)  magnesium hydroxide (MILK OF MAGNESIA) suspension 30 mL (has no administration in time range)  bisacodyl (DULCOLAX) EC tablet 5 mg (has no administration in time range)  morphine 2 MG/ML injection 2 mg (2 mg Intravenous Given 07/20/20 1320)     ____________________________________________   INITIAL IMPRESSION / ASSESSMENT AND PLAN / ED COURSE  Pertinent labs & imaging results that were available during my care of the patient were reviewed by me and considered in my medical decision making (see chart for details).  Review of the [place] CSRS was performed in accordance of the [facility] prior to dispensing any controlled drugs.           Patient's diagnosis is consistent with mechanical fall leading to right hip fracture, minor head injury with scalp laceration.  Patient presented to the emergency department after tripping over table in his house.  Patient fell striking his head and his right hip.  Shortening and rotation of the right lower extremity was identified on arrival.  Concern for hip fracture which was confirmed with x-ray.  Patient did hit his head but did not lose consciousness.  He had a hematoma in the right parietal scalp region.  Small overlying laceration with good cessation of bleeding.  This did not require staples.  Imaging of the head and neck was reassuring with no acute findings.  Patient remained stable in the emergency department.  I talked to on-call orthopedic surgeon, Dr. Hyacinth Meeker who agrees to take patient to the OR tomorrow for surgical fixation of right hip fracture.  Patient be admitted to the hospital service at this time.    ____________________________________________  FINAL CLINICAL IMPRESSION(S) / ED DIAGNOSES  Final diagnoses:  Closed fracture of right hip, initial encounter (HCC)  Fall, initial encounter  Minor head injury,  initial encounter  Laceration of scalp, initial encounter      NEW MEDICATIONS STARTED DURING THIS VISIT:  ED Discharge Orders    None          This chart was dictated using voice recognition software/Dragon. Despite best efforts to proofread, errors can occur which can change the meaning. Any change was purely unintentional.    Racheal Patches, PA-C 07/20/20  1704    Duffy Bruce, MD 07/22/20 [phone]

## 2020-07-20 NOTE — ED Notes (Signed)
This RN attempted to use sunquest multiple times to print pt label for UA. Pt chart locked at this time, will use chart label at this time

## 2020-07-20 NOTE — ED Triage Notes (Signed)
Pt to ED via [facility] from home after mechanical fall. Pt has shortening and rotation to the right leg. Pt c/o of mild leg pain that is worse with movement. Pt denies LOC or use of blood thinners. Pt is A & O at this time. Pt has skin tear to right arm and also has small laceration behind right ear. Pt is in NAD.

## 2020-07-20 NOTE — ED Notes (Signed)
Pt transported to xray 

## 2020-07-20 NOTE — ED Notes (Addendum)
Contact with Selinda Eon, RN charge for delay for "purple man" showing on pt - charge messaged floor 1A

## 2020-07-21 ENCOUNTER — Inpatient Hospital Stay: Payer: Medicare Other | Admitting: Anesthesiology

## 2020-07-21 ENCOUNTER — Inpatient Hospital Stay: Payer: Medicare Other

## 2020-07-21 ENCOUNTER — Encounter
Admission: EM | Disposition: A | Discharge status: Skilled Nursing Facility | Payer: Self-pay | Source: Home / Self Care | Attending: Internal Medicine

## 2020-07-21 DIAGNOSIS — D696 Thrombocytopenia, unspecified: Secondary | ICD-10-CM

## 2020-07-21 DIAGNOSIS — I10 Essential (primary) hypertension: Secondary | ICD-10-CM

## 2020-07-21 DIAGNOSIS — D479 Neoplasm of uncertain behavior of lymphoid, hematopoietic and related tissue, unspecified: Secondary | ICD-10-CM

## 2020-07-21 DIAGNOSIS — D500 Iron deficiency anemia secondary to blood loss (chronic): Secondary | ICD-10-CM

## 2020-07-21 DIAGNOSIS — S72001S Fracture of unspecified part of neck of right femur, sequela: Secondary | ICD-10-CM

## 2020-07-21 DIAGNOSIS — S0101XA Laceration without foreign body of scalp, initial encounter: Secondary | ICD-10-CM

## 2020-07-21 DIAGNOSIS — N182 Chronic kidney disease, stage 2 (mild): Secondary | ICD-10-CM

## 2020-07-21 DIAGNOSIS — E785 Hyperlipidemia, unspecified: Secondary | ICD-10-CM

## 2020-07-21 DIAGNOSIS — S0101XS Laceration without foreign body of scalp, sequela: Secondary | ICD-10-CM

## 2020-07-21 HISTORY — PX: INTRAMEDULLARY (IM) NAIL INTERTROCHANTERIC: SHX5875

## 2020-07-21 LAB — CBC
HCT: 24.3 % — ABNORMAL LOW (ref 39.0–52.0)
HCT: 26.7 % — ABNORMAL LOW (ref 39.0–52.0)
Hemoglobin: 7.8 g/dL — ABNORMAL LOW (ref 13.0–17.0)
Hemoglobin: 8.5 g/dL — ABNORMAL LOW (ref 13.0–17.0)
MCH: 30.6 pg (ref 26.0–34.0)
MCH: 30.5 pg (ref 26.0–34.0)
MCHC: 32.1 g/dL (ref 30.0–36.0)
MCHC: 31.8 g/dL (ref 30.0–36.0)
MCV: 95.3 fL (ref 80.0–100.0)
MCV: 95.7 fL (ref 80.0–100.0)
Platelets: 101 10*3/uL — ABNORMAL LOW (ref 150–400)
Platelets: 87 10*3/uL — ABNORMAL LOW (ref 150–400)
RBC: 2.55 MIL/uL — ABNORMAL LOW (ref 4.22–5.81)
RBC: 2.79 MIL/uL — ABNORMAL LOW (ref 4.22–5.81)
RDW: 17.4 % — ABNORMAL HIGH (ref 11.5–15.5)
RDW: 17.1 % — ABNORMAL HIGH (ref 11.5–15.5)
WBC: 14.5 10*3/uL — ABNORMAL HIGH (ref 4.0–10.5)
WBC: 18.1 10*3/uL — ABNORMAL HIGH (ref 4.0–10.5)
nRBC: 0 % (ref 0.0–0.2)
nRBC: 0 % (ref 0.0–0.2)

## 2020-07-21 LAB — PREPARE RBC (CROSSMATCH)

## 2020-07-21 LAB — SURGICAL PCR SCREEN
MRSA, PCR: NEGATIVE
Staphylococcus aureus: POSITIVE — AB

## 2020-07-21 LAB — BASIC METABOLIC PANEL
Anion gap: 7 (ref 5–15)
BUN: 30 mg/dL — ABNORMAL HIGH (ref 8–23)
CO2: 26 mmol/L (ref 22–32)
Calcium: 8.7 mg/dL — ABNORMAL LOW (ref 8.9–10.3)
Chloride: 108 mmol/L (ref 98–111)
Creatinine, Ser: 1.17 mg/dL (ref 0.61–1.24)
GFR, Estimated: 58 mL/min — ABNORMAL LOW (ref >60)
Glucose, Bld: 137 mg/dL — ABNORMAL HIGH (ref 70–99)
Potassium: 4.9 mmol/L (ref 3.5–5.1)
Sodium: 141 mmol/L (ref 135–145)

## 2020-07-21 LAB — SARS CORONAVIRUS 2 (TAT 6-24 HRS): SARS Coronavirus 2: NEGATIVE

## 2020-07-21 SURGERY — FIXATION, FRACTURE, INTERTROCHANTERIC, WITH INTRAMEDULLARY ROD
Anesthesia: Spinal | Site: Hip | Laterality: Right

## 2020-07-21 MED ORDER — BUPIVACAINE HCL (PF) 0.5 % IJ SOLN
INTRAMUSCULAR | Status: DC | PRN
  Administered 2020-07-21: 2.5 mL

## 2020-07-21 MED ORDER — MUPIROCIN 2 % EX OINT
1.0000 "application " | TOPICAL_OINTMENT | Freq: Two times a day (BID) | CUTANEOUS | Status: AC
  Administered 2020-07-21 – 2020-07-25 (×8): 1 via NASAL
  Filled 2020-07-21: qty 22

## 2020-07-21 MED ORDER — PROPOFOL 500 MG/50ML IV EMUL
INTRAVENOUS | Status: DC | PRN
  Administered 2020-07-21: 30 ug/kg/min via INTRAVENOUS

## 2020-07-21 MED ORDER — ZOLPIDEM TARTRATE 5 MG PO TABS: 5.0000 mg | ORAL_TABLET | Freq: Every evening | ORAL | Status: DC | PRN

## 2020-07-21 MED ORDER — CLINDAMYCIN PHOSPHATE 600 MG/50ML IV SOLN
600.0000 mg | Freq: Three times a day (TID) | INTRAVENOUS | Status: AC
  Administered 2020-07-21 – 2020-07-22 (×3): 600 mg via INTRAVENOUS
  Filled 2020-07-21 (×3): qty 50

## 2020-07-21 MED ORDER — ONDANSETRON HCL 4 MG/2ML IJ SOLN: 4.0000 mg | Freq: Four times a day (QID) | INTRAMUSCULAR | Status: DC | PRN

## 2020-07-21 MED ORDER — PROPOFOL 10 MG/ML IV BOLUS
INTRAVENOUS | Status: AC
  Filled 2020-07-21: qty 20

## 2020-07-21 MED ORDER — ONDANSETRON HCL 4 MG PO TABS: 4.0000 mg | ORAL_TABLET | Freq: Four times a day (QID) | ORAL | Status: DC | PRN

## 2020-07-21 MED ORDER — FENTANYL CITRATE (PF) 100 MCG/2ML IJ SOLN
INTRAMUSCULAR | Status: AC
  Filled 2020-07-21: qty 2

## 2020-07-21 MED ORDER — ONDANSETRON HCL 4 MG/2ML IJ SOLN: 4.0000 mg | Freq: Once | INTRAMUSCULAR | Status: DC | PRN

## 2020-07-21 MED ORDER — OXYCODONE HCL 5 MG PO TABS: 5.0000 mg | ORAL_TABLET | Freq: Once | ORAL | Status: DC | PRN

## 2020-07-21 MED ORDER — SENNA 8.6 MG PO TABS
1.0000 | ORAL_TABLET | Freq: Two times a day (BID) | ORAL | Status: DC
  Administered 2020-07-21 – 2020-07-25 (×8): 8.6 mg via ORAL
  Filled 2020-07-21 (×8): qty 1

## 2020-07-21 MED ORDER — ENOXAPARIN SODIUM 30 MG/0.3ML ~~LOC~~ SOLN
30.0000 mg | SUBCUTANEOUS | Status: DC
  Administered 2020-07-22 – 2020-07-26 (×4): 30 mg via SUBCUTANEOUS
  Filled 2020-07-21 (×5): qty 0.3

## 2020-07-21 MED ORDER — BISACODYL 10 MG RE SUPP: 10.0000 mg | Freq: Every day | RECTAL | Status: DC | PRN

## 2020-07-21 MED ORDER — ALUM & MAG HYDROXIDE-SIMETH 200-200-20 MG/5ML PO SUSP
30.0000 mL | ORAL | Status: DC | PRN
Start: 2020-07-21 — End: 2020-07-26
  Administered 2020-07-24: 30 mL via ORAL
  Filled 2020-07-21: qty 30

## 2020-07-21 MED ORDER — ACETAMINOPHEN 325 MG PO TABS
325.0000 mg | ORAL_TABLET | Freq: Four times a day (QID) | ORAL | Status: DC | PRN
Start: 2020-07-22 — End: 2020-07-26
  Administered 2020-07-23: 650 mg via ORAL
  Filled 2020-07-21: qty 2

## 2020-07-21 MED ORDER — TRAMADOL HCL 50 MG PO TABS
50.0000 mg | ORAL_TABLET | Freq: Four times a day (QID) | ORAL | Status: DC
Start: 2020-07-21 — End: 2020-07-21
  Administered 2020-07-21: 50 mg via ORAL
  Filled 2020-07-21: qty 1

## 2020-07-21 MED ORDER — METOCLOPRAMIDE HCL 5 MG/ML IJ SOLN
5.0000 mg | Freq: Three times a day (TID) | INTRAMUSCULAR | Status: DC | PRN
Start: 2020-07-21 — End: 2020-07-26

## 2020-07-21 MED ORDER — MENTHOL 3 MG MT LOZG
1.0000 | LOZENGE | OROMUCOSAL | Status: DC | PRN
  Filled 2020-07-21: qty 9

## 2020-07-21 MED ORDER — SODIUM CHLORIDE 0.45 % IV SOLN: INTRAVENOUS | Status: DC

## 2020-07-21 MED ORDER — CEFAZOLIN SODIUM-DEXTROSE 2-4 GM/100ML-% IV SOLN
2.0000 g | Freq: Three times a day (TID) | INTRAVENOUS | Status: AC
  Administered 2020-07-21 – 2020-07-22 (×3): 2 g via INTRAVENOUS
  Filled 2020-07-21 (×3): qty 100

## 2020-07-21 MED ORDER — ATORVASTATIN CALCIUM 10 MG PO TABS: 10.0000 mg | ORAL_TABLET | Freq: Every day | ORAL | Status: DC

## 2020-07-21 MED ORDER — FLEET ENEMA 7-19 GM/118ML RE ENEM: 1.0000 | ENEMA | Freq: Once | RECTAL | Status: DC | PRN

## 2020-07-21 MED ORDER — OXYCODONE HCL 5 MG/5ML PO SOLN: 5.0000 mg | Freq: Once | ORAL | Status: DC | PRN

## 2020-07-21 MED ORDER — PROPOFOL 10 MG/ML IV BOLUS
INTRAVENOUS | Status: DC | PRN
  Administered 2020-07-21: 10 mg via INTRAVENOUS
  Administered 2020-07-21: 20 mg via INTRAVENOUS

## 2020-07-21 MED ORDER — BUPIVACAINE-EPINEPHRINE (PF) 0.25% -1:200000 IJ SOLN
INTRAMUSCULAR | Status: DC | PRN
  Administered 2020-07-21: 30 mL via PERINEURAL

## 2020-07-21 MED ORDER — PHENOL 1.4 % MT LIQD
1.0000 | OROMUCOSAL | Status: DC | PRN
  Filled 2020-07-21: qty 177

## 2020-07-21 MED ORDER — LACTATED RINGERS IV SOLN: INTRAVENOUS | Status: DC | PRN

## 2020-07-21 MED ORDER — FERROUS SULFATE 325 (65 FE) MG PO TABS: 325.0000 mg | ORAL_TABLET | Freq: Every day | ORAL | Status: DC

## 2020-07-21 MED ORDER — TRAMADOL HCL 50 MG PO TABS
50.0000 mg | ORAL_TABLET | Freq: Four times a day (QID) | ORAL | Status: DC | PRN
  Administered 2020-07-22: 50 mg via ORAL
  Filled 2020-07-21: qty 1

## 2020-07-21 MED ORDER — CHLORHEXIDINE GLUCONATE CLOTH 2 % EX PADS
6.0000 | MEDICATED_PAD | Freq: Every day | CUTANEOUS | Status: DC
  Administered 2020-07-21: 6 via TOPICAL

## 2020-07-21 MED ORDER — SODIUM CHLORIDE 0.9 % IV SOLN
INTRAVENOUS | Status: DC | PRN
  Administered 2020-07-21: 50 ug/min via INTRAVENOUS

## 2020-07-21 MED ORDER — FERROUS SULFATE 325 (65 FE) MG PO TABS
325.0000 mg | ORAL_TABLET | Freq: Two times a day (BID) | ORAL | Status: DC
  Administered 2020-07-22 – 2020-07-26 (×7): 325 mg via ORAL
  Filled 2020-07-21 (×7): qty 1

## 2020-07-21 MED ORDER — FENTANYL CITRATE (PF) 100 MCG/2ML IJ SOLN: 25.0000 ug | INTRAMUSCULAR | Status: DC | PRN

## 2020-07-21 MED ORDER — METOCLOPRAMIDE HCL 10 MG PO TABS: 5.0000 mg | ORAL_TABLET | Freq: Three times a day (TID) | ORAL | Status: DC | PRN

## 2020-07-21 SURGICAL SUPPLY — 44 items
BIT DRILL CALIBRATED 4.2 (BIT) IMPLANT
BLADE HELICAL TFNA 90 (Anchor) ×1 IMPLANT
BNDG COHESIVE 4X5 TAN STRL (GAUZE/BANDAGES/DRESSINGS) ×2 IMPLANT
CANISTER SUCT 1200ML W/VALVE (MISCELLANEOUS) ×2 IMPLANT
CHLORAPREP W/TINT 26 (MISCELLANEOUS) ×4 IMPLANT
COVER WAND RF STERILE (DRAPES) ×2 IMPLANT
DRAPE C-ARMOR (DRAPES) IMPLANT
DRAPE INCISE 23X17 IOBAN STRL (DRAPES) ×1
DRAPE INCISE 23X17 STRL (DRAPES) ×1 IMPLANT
DRAPE INCISE IOBAN 23X17 STRL (DRAPES) ×1 IMPLANT
DRESSING AQUACEL AG SP 3.5X10 (GAUZE/BANDAGES/DRESSINGS) IMPLANT
DRILL BIT CALIBRATED 4.2 (BIT) ×2
DRSG AQUACEL AG ADV 3.5X10 (GAUZE/BANDAGES/DRESSINGS) IMPLANT
DRSG AQUACEL AG ADV 3.5X14 (GAUZE/BANDAGES/DRESSINGS) IMPLANT
DRSG AQUACEL AG SP 3.5X10 (GAUZE/BANDAGES/DRESSINGS) ×2
ELECT REM PT RETURN 9FT ADLT (ELECTROSURGICAL) ×2
ELECTRODE REM PT RTRN 9FT ADLT (ELECTROSURGICAL) ×1 IMPLANT
GAUZE SPONGE 4X4 12PLY STRL (GAUZE/BANDAGES/DRESSINGS) ×2 IMPLANT
GAUZE XEROFORM 1X8 LF (GAUZE/BANDAGES/DRESSINGS) IMPLANT
GLOVE INDICATOR 8.0 STRL GRN (GLOVE) ×2 IMPLANT
GLOVE SURG ORTHO 8.5 STRL (GLOVE) ×2 IMPLANT
GOWN STRL REUS W/ TWL LRG LVL3 (GOWN DISPOSABLE) ×1 IMPLANT
GOWN STRL REUS W/TWL LRG LVL3 (GOWN DISPOSABLE) ×1
GOWN STRL REUS W/TWL LRG LVL4 (GOWN DISPOSABLE) ×2 IMPLANT
GUIDEWIRE 3.2X400 (WIRE) ×2 IMPLANT
KIT TURNOVER KIT A (KITS) ×2 IMPLANT
MANIFOLD NEPTUNE II (INSTRUMENTS) ×2 IMPLANT
MAT ABSORB  FLUID 56X50 GRAY (MISCELLANEOUS) ×1
MAT ABSORB FLUID 56X50 GRAY (MISCELLANEOUS) ×1 IMPLANT
NAIL TROCH FIX 10X170 130 (Nail) ×1 IMPLANT
NDL SPNL 18GX3.5 QUINCKE PK (NEEDLE) ×1 IMPLANT
NEEDLE SPNL 18GX3.5 QUINCKE PK (NEEDLE) ×2 IMPLANT
NS IRRIG 500ML POUR BTL (IV SOLUTION) ×2 IMPLANT
PACK HIP COMPR (MISCELLANEOUS) ×2 IMPLANT
SCREW LOCK STAR 5X36 (Screw) ×1 IMPLANT
SOL PREP PVP 2OZ (MISCELLANEOUS) ×2
SOLUTION PREP PVP 2OZ (MISCELLANEOUS) ×1 IMPLANT
STAPLER SKIN PROX 35W (STAPLE) ×2 IMPLANT
SUCTION FRAZIER HANDLE 10FR (MISCELLANEOUS) ×1
SUCTION TUBE FRAZIER 10FR DISP (MISCELLANEOUS) ×1 IMPLANT
SUT VIC AB 0 CT1 36 (SUTURE) ×2 IMPLANT
SUT VIC AB 2-0 CT1 27 (SUTURE) ×1
SUT VIC AB 2-0 CT1 TAPERPNT 27 (SUTURE) ×1 IMPLANT
SYR 30ML LL (SYRINGE) ×2 IMPLANT

## 2020-07-21 NOTE — Anesthesia Procedure Notes (Addendum)
Spinal  Patient location during procedure: OR Start time: 07/21/2020 8:23 AM End time: 07/21/2020 8:27 AM Staffing Performed: resident/CRNA  Resident/CRNA: Nelda Marseille, CRNA Preanesthetic Checklist Completed: patient identified, IV checked, site marked, risks and benefits discussed, surgical consent, monitors and equipment checked, pre-op evaluation and timeout performed Spinal Block Patient position: sitting Prep: Betadine Patient monitoring: heart rate, continuous pulse ox, blood pressure and cardiac monitor Approach: midline Location: L3-4 Injection technique: single-shot Needle Needle type: Whitacre and Introducer  Needle gauge: 25 G Needle length: 9 cm Additional Notes Negative paresthesia. Negative blood return. Positive free-flowing CSF. Expiration date of kit checked and confirmed. Patient tolerated procedure well, without complications.

## 2020-07-21 NOTE — Anesthesia Preprocedure Evaluation (Signed)
Anesthesia Evaluation  Patient identified by MRN, date of birth, ID band Patient awake  General Assessment Comment:AOx3. Does not think he has had general anesthesia before  Reviewed: Allergy & Precautions, NPO status , Patient's Chart, lab work & pertinent test results  History of Anesthesia Complications Negative for: history of anesthetic complications  Airway Mallampati: I  TM Distance: >3 FB Neck ROM: Full    Dental  (+) Edentulous Upper, Edentulous Lower   Pulmonary neg pulmonary ROS, neg sleep apnea, neg COPD, Patient abstained from smoking.Not current smoker, former smoker,    Pulmonary exam normal breath sounds clear to auscultation       Cardiovascular Exercise Tolerance: Good METS(-) hypertension+ CAD and + Cardiac Stents  (-) Past MI (-) dysrhythmias  Rhythm:Regular Rate:Normal - Systolic murmurs DES 3428. Lives alone and independent, walks.   Neuro/Psych negative neurological ROS  negative psych ROS   GI/Hepatic GERD  Controlled,(+)     (-) substance abuse  ,   Endo/Other  neg diabetes  Renal/GU negative Renal ROS     Musculoskeletal   Abdominal   Peds  Hematology  (+) Blood dyscrasia, anemia , GI bleed one month ago, anticoagulation stopped at that time. Anemic today Chronic thromocytopenia   Anesthesia Other Findings Past Medical History: No date: GERD (gastroesophageal reflux disease) 04/2016: GI bleed     Comment:  presumed diverticular source, treated with embolization               by IR No date: HOH (hard of hearing)     Comment:  right sided hearing aid 03/2018: Iron deficiency anemia 03/2018: Lymphoproliferative disorder ([place])     Comment:  heme: Dr Janese Banks in [place].  low grade monocytosis,               thrombocytopenia: likely low-grade lymphproliferative               disorder.    Reproductive/Obstetrics                             Anesthesia  Physical Anesthesia Plan  ASA: III  Anesthesia Plan: General/Spinal   Post-op Pain Management:    Induction: Intravenous  PONV Risk Score and Plan: 2 and Ondansetron, Dexamethasone, Propofol infusion, TIVA and Treatment may vary due to age or medical condition  Airway Management Planned: Natural Airway  Additional Equipment: None  Intra-op Plan:   Post-operative Plan:   Informed Consent: I have reviewed the patients History and Physical, chart, labs and discussed the procedure including the risks, benefits and alternatives for the proposed anesthesia with the patient or authorized representative who has indicated his/her understanding and acceptance.       Plan Discussed with: CRNA and Surgeon  Anesthesia Plan Comments: (Discussed R/B/A of neuraxial anesthesia technique with patient and daughter at bedside: - rare risks of spinal/epidural hematoma, nerve damage, infection - Risk of PDPH - Risk of nausea and vomiting - Risk of conversion to general anesthesia and its associated risks, including sore throat, damage to lips/teeth/oropharynx, and rare risks such as cardiac and respiratory events. - risk of POCD  Patient voiced understanding.)        Anesthesia Quick Evaluation

## 2020-07-21 NOTE — Anesthesia Postprocedure Evaluation (Signed)
Anesthesia Post Note  Patient: Dustin Howell  Procedure(s) Performed: INTRAMEDULLARY (IM) NAIL INTERTROCHANTRIC (Right Hip)  Patient location during evaluation: PACU Anesthesia Type: Combined General/Spinal Level of consciousness: oriented and awake and alert Pain management: pain level controlled Vital Signs Assessment: post-procedure vital signs reviewed and stable Respiratory status: spontaneous breathing, respiratory function stable and patient connected to nasal cannula oxygen Cardiovascular status: blood pressure returned to baseline and stable Postop Assessment: no headache, no backache and no apparent nausea or vomiting Anesthetic complications: no   No complications documented.   Last Vitals:  Vitals:   07/21/20 1015 07/21/20 1055  BP: 132/88 115/66  Pulse: 75 84  Resp: 12 16  Temp: 37 C (!) 36.3 C  SpO2: 99% 100%    Last Pain:  Vitals:   07/21/20 1015  TempSrc:   PainSc: 0-No pain                 Arita Miss

## 2020-07-21 NOTE — Progress Notes (Signed)
Patient ID: Sharlene Motts, male   DOB: [birth year], 87 y.o.   MRN: [id number] [facility] PROGRESS NOTE  Jaylen Mamie Nick Dikes [id number] DOB: [birth year] DOA: 07/20/2020 PCP: [name], MD  HPI/Subjective: Patient seen after hip surgery today.  Came in after a fall.  States he is not in any pain.  He also stated that he is going home.  Patient offers no complaints.  No chest pain or shortness of breath.  Objective: Vitals:   07/21/20 1211 07/21/20 1310  BP: 128/64 (!) 130/59  Pulse: 96 95  Resp: 15 15  Temp: 98.1 F (36.7 C) 98.5 F (36.9 C)  SpO2: 100% 99%    Intake/Output Summary (Last 24 hours) at 07/21/2020 1324 Last data filed at 07/21/2020 0944 Gross per 24 hour  Intake 2277.23 ml  Output 175 ml  Net 2102.23 ml   Filed Weights   07/20/20 1227  Weight: 47.6 kg    ROS: Review of Systems  Respiratory: Negative for shortness of breath.   Cardiovascular: Negative for chest pain.  Gastrointestinal: Negative for abdominal pain, nausea and vomiting.  Musculoskeletal: Negative for joint pain.   Exam: Physical Exam HENT:     Head: Normocephalic.     Mouth/Throat:     Pharynx: No oropharyngeal exudate.  Eyes:     General: Lids are normal.     Conjunctiva/sclera: Conjunctivae normal.     Pupils: Pupils are equal, round, and reactive to light.  Cardiovascular:     Rate and Rhythm: Normal rate and regular rhythm.     Heart sounds: Normal heart sounds, S1 normal and S2 normal.  Pulmonary:     Breath sounds: No decreased breath sounds, wheezing, rhonchi or rales.  Abdominal:     Palpations: Abdomen is soft.     Tenderness: There is no abdominal tenderness.  Musculoskeletal:     Right ankle: No swelling.     Left ankle: No swelling.  Skin:    General: Skin is warm.     Findings: No rash.  Neurological:     Mental Status: He is alert and oriented to person, place, and time.     Comments: Head tremor Able to flex and extend at the ankles.  Sensation intact bilateral  lower extremities.       Data Reviewed: Basic Metabolic Panel: Recent Labs  Lab 07/20/20 1306 07/21/20 0534  NA 137 141  K 3.9 4.9  CL 102 108  CO2 25 26  GLUCOSE 121* 137*  BUN 22 30*  CREATININE 0.95 1.17  CALCIUM 9.1 8.7*   CBC: Recent Labs  Lab 07/20/20 1306 07/21/20 0534 07/21/20 1051  WBC 9.0 14.5* 18.1*  NEUTROABS 4.6  --   --   HGB 10.3* 7.8* 8.5*  HCT 32.3* 24.3* 26.7*  MCV 95.8 95.3 95.7  PLT 127* 101* 87*     Recent Results (from the past 240 hour(s))  SARS CORONAVIRUS 2 (TAT 6-24 HRS) Nasopharyngeal Nasopharyngeal Swab     Status: None   Collection Time: 07/20/20 12:45 PM   Specimen: Nasopharyngeal Swab  Result Value Ref Range Status   SARS Coronavirus 2 NEGATIVE NEGATIVE Final    Comment: (NOTE) SARS-CoV-2 target nucleic acids are NOT DETECTED.  The SARS-CoV-2 RNA is generally detectable in upper and lower respiratory specimens during the acute phase of infection. Negative results do not preclude SARS-CoV-2 infection, do not rule out co-infections with other pathogens, and should not be used as the sole basis for treatment or other patient management decisions.  Negative results must be combined with clinical observations, patient history, and epidemiological information. The expected result is Negative.  Fact Sheet for Patients: SugarRoll.be  Fact Sheet for Healthcare Providers: https://www.woods-mathews.com/  This test is not yet approved or cleared by the [place] FDA and  has been authorized for detection and/or diagnosis of SARS-CoV-2 by FDA under an Emergency Use Authorization (EUA). This EUA will remain  in effect (meaning this test can be used) for the duration of the COVID-19 declaration under Se ction 564(b)(1) of the Act, 21 U.S.C. section 360bbb-3(b)(1), unless the authorization is terminated or revoked sooner.  Performed at [facility], [place] [address], [place],  [place] [address]   Surgical PCR screen     Status: Abnormal   Collection Time: 07/21/20 12:06 AM   Specimen: Nasal Mucosa; Nasal Swab  Result Value Ref Range Status   MRSA, PCR NEGATIVE NEGATIVE Final   Staphylococcus aureus POSITIVE (A) NEGATIVE Final    Comment: (NOTE) The Xpert SA Assay (FDA approved for NASAL specimens in patients 50 years of age and older), is one component of a comprehensive surveillance program. It is not intended to diagnose infection nor to guide or monitor treatment. Performed at [facility], [address] [place], [place] [address]      Studies: DG Chest 1 View  Result Date: 07/20/2020 CLINICAL DATA:  Fall EXAM: CHEST  1 VIEW COMPARISON:  May 13, 2016 FINDINGS: The cardiomediastinal silhouette is unchanged in contour.Atherosclerotic calcifications of the aorta. Biapical pleuroparenchymal scarring, unchanged. No pleural effusion. No pneumothorax. No acute pleuroparenchymal abnormality. Visualized abdomen is unremarkable. Multilevel degenerative changes of the thoracic spine. IMPRESSION: No acute cardiopulmonary abnormality. Electronically Signed   By: Valentino Saxon MD   On: 07/20/2020 13:01   CT Head Wo Contrast  Result Date: 07/20/2020 CLINICAL DATA:  Trauma to the head and neck EXAM: CT HEAD WITHOUT CONTRAST CT CERVICAL SPINE WITHOUT CONTRAST TECHNIQUE: Multidetector CT imaging of the head and cervical spine was performed following the standard protocol without intravenous contrast. Multiplanar CT image reconstructions of the cervical spine were also generated. COMPARISON:  None. FINDINGS: CT HEAD FINDINGS Brain: Age related atrophy. Chronic small-vessel ischemic changes of the hemispheric white matter. No sign of acute infarction, mass lesion, hemorrhage, hydrocephalus or extra-axial collection. Vascular: There is atherosclerotic calcification of the major vessels at the base of the brain. Skull: No skull fracture. Sinuses/Orbits: Chronic  inflammatory changes of the left division of the sphenoid sinus. Other sinuses clear. Orbits negative. Other: None CT CERVICAL SPINE FINDINGS Alignment: Normal Skull base and vertebrae: No fracture or primary bone lesion. Soft tissues and spinal canal: No significant soft tissue finding. Disc levels: Chronic degenerative spondylosis with endplate osteophytes and mild bulging of the discs from C2-3 through C6-7. Chronic facet fusion on the right at C3-4. Facet osteoarthritis on the right at C7-T1 and on the left at C2-3. Upper chest: Pleural and parenchymal scarring. Other: None IMPRESSION: HEAD CT: No acute or traumatic finding. Atrophy and chronic small-vessel ischemic changes. CERVICAL SPINE CT: No acute or traumatic finding. Chronic degenerative spondylosis and facet osteoarthritis. Electronically Signed   By: Nelson Chimes M.D.   On: 07/20/2020 13:37   CT Cervical Spine Wo Contrast  Result Date: 07/20/2020 CLINICAL DATA:  Trauma to the head and neck EXAM: CT HEAD WITHOUT CONTRAST CT CERVICAL SPINE WITHOUT CONTRAST TECHNIQUE: Multidetector CT imaging of the head and cervical spine was performed following the standard protocol without intravenous contrast. Multiplanar CT image reconstructions  of the cervical spine were also generated. COMPARISON:  None. FINDINGS: CT HEAD FINDINGS Brain: Age related atrophy. Chronic small-vessel ischemic changes of the hemispheric white matter. No sign of acute infarction, mass lesion, hemorrhage, hydrocephalus or extra-axial collection. Vascular: There is atherosclerotic calcification of the major vessels at the base of the brain. Skull: No skull fracture. Sinuses/Orbits: Chronic inflammatory changes of the left division of the sphenoid sinus. Other sinuses clear. Orbits negative. Other: None CT CERVICAL SPINE FINDINGS Alignment: Normal Skull base and vertebrae: No fracture or primary bone lesion. Soft tissues and spinal canal: No significant soft tissue finding. Disc levels:  Chronic degenerative spondylosis with endplate osteophytes and mild bulging of the discs from C2-3 through C6-7. Chronic facet fusion on the right at C3-4. Facet osteoarthritis on the right at C7-T1 and on the left at C2-3. Upper chest: Pleural and parenchymal scarring. Other: None IMPRESSION: HEAD CT: No acute or traumatic finding. Atrophy and chronic small-vessel ischemic changes. CERVICAL SPINE CT: No acute or traumatic finding. Chronic degenerative spondylosis and facet osteoarthritis. Electronically Signed   By: Nelson Chimes M.D.   On: 07/20/2020 13:37   DG HIP OPERATIVE UNILAT W OR W/O PELVIS RIGHT  Result Date: 07/21/2020 CLINICAL DATA:  Right hip intertrochanteric fracture, operative fixation EXAM: OPERATIVE RIGHT HIP (WITH PELVIS IF PERFORMED) 3 VIEWS TECHNIQUE: Fluoroscopic spot image(s) were submitted for interpretation post-operatively. COMPARISON:  07/20/2020 FINDINGS: Spot fluoroscopic intraoperative views demonstrate fixation pen and intramedullary rod reducing the right hip intertrochanteric fracture. Fracture lines remain visible. Lesser trochanter fragment remains displaced. Anatomic alignment. No associated subluxation or dislocation. Bones are osteopenic. Peripheral atherosclerosis noted. IMPRESSION: Status post ORIF of the right hip intertrochanteric fracture with improved anatomic alignment. Electronically Signed   By: Jerilynn Mages.  Shick M.D.   On: 07/21/2020 09:41   DG Hip Unilat W or Wo Pelvis 2-3 Views Right  Result Date: 07/20/2020 CLINICAL DATA:  Fall EXAM: DG HIP (WITH OR WITHOUT PELVIS) 2-3V RIGHT COMPARISON:  None. FINDINGS: Osteopenia. There is a comminuted inter trochanteric fracture. The lesser trochanter is mildly displaced. Fracture extends through the greater trochanter. The femoral head appears to be seated within the acetabulum. There are mild degenerative changes of the femoral head. Evaluation of the sacrum is limited secondary to overlying bowel gas. Pelvic phleboliths. No  definitive additional fracture noted. Vascular calcifications. IMPRESSION: Comminuted right intertrochanteric fracture with mild displacement of the lesser trochanter. Electronically Signed   By: Valentino Saxon MD   On: 07/20/2020 13:00    Scheduled Meds: . amLODipine  5 mg Oral Daily  . atorvastatin  10 mg Oral Daily  . atorvastatin  10 mg Oral Daily  . [START ON 07/22/2020] enoxaparin (LOVENOX) injection  30 mg Subcutaneous Q24H  . [START ON 07/22/2020] ferrous sulfate  325 mg Oral Q breakfast  . mupirocin ointment  1 application Nasal BID  . senna  1 tablet Oral BID  . traMADol  50 mg Oral Q6H   Continuous Infusions: .  ceFAZolin (ANCEF) IV    . clindamycin (CLEOCIN) IV      Assessment/Plan:  1. Closed right hip fracture status post operative repair.  ORIF today by Dr. Sabra Heck.  As needed pain medication.  Physical therapy evaluation. 2. Iron deficiency anemia.  Increase ferrous sulfate to twice daily dosing.  Hemoglobin after surgery higher than it was prior.  Hemoglobin 8.5 currently.  History of GI bleed.  Discontinue IV fluids to lower risk of needing transfusion. 3. 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 4.  History of CAD on atorvastatin  5. Essential hypertension on Norvasc 6. Hyperlipidemia unspecified on atorvastatin 7. Scalp laceration secondary to trauma and fall on Ancef.  CT scan of the head negative for bleed. 8. chronic kidney disease stage II.        Code Status:     Code Status Orders  (From admission, onward)         Start     Ordered   07/20/20 1542  Full code  Continuous        07/20/20 1547        Code Status History    Date Active Date Inactive Code Status Order ID Comments User Context   06/20/2020 1600 06/23/2020 2258 Full Code [id number]  Marianna Payment, MD ED   04/21/2017 1010 04/22/2017 1333 Full Code [id number]  Isaias Cowman, MD Inpatient   05/11/2016 2340 05/19/2016 1758 Full Code [id number]   Corey Harold, NP Inpatient   Advance Care Planning Activity     Family Communication: Spoke with daughter on the phone Disposition Plan: Status is: Inpatient  Dispo: The patient is from: Home              Anticipated d/c is to: Patient would like to go home but most likely rehab              Anticipated d/c date is: 2 to 3 days postoperatively either Tuesday or Wednesday              Patient currently looks good postoperatively day 0.  Time spent: 29 minutes  Kennan

## 2020-07-21 NOTE — H&P (Signed)
THE PATIENT WAS SEEN PRIOR TO SURGERY TODAY.  HISTORY, ALLERGIES, HOME MEDICATIONS AND OPERATIVE PROCEDURE WERE REVIEWED. RISKS AND BENEFITS OF SURGERY DISCUSSED WITH PATIENT AGAIN.  NO CHANGES FROM INITIAL HISTORY AND PHYSICAL NOTED.    

## 2020-07-21 NOTE — Op Note (Signed)
DATE OF SURGERY:  07/21/2020  TIME: 9:45 AM  PATIENT NAME:  Dustin Howell  AGE: 87 y.o.  PRE-OPERATIVE DIAGNOSIS: Comminuted, displaced intertrochanteric fracture right hip   POST-OPERATIVE DIAGNOSIS:  SAME  PROCEDURE:  INTRAMEDULLARY (IM) NAIL INTERTROCHANTRIC right hip  SURGEON:  Park Breed  ASST:  EBL:  50 cc  COMPLICATIONS: None  OPERATIVE IMPLANTS: Synthes trochanteric femoral nail 10 mm / 130 degrees with interlocking helical blade 90 mm and distal locking screw 36 mm.  PREOPERATIVE INDICATIONS:  Dustin Howell is a 87 y.o. year old who fell and suffered a hip fracture. He was brought into the ER and then admitted and optimized and then elected for surgical intervention.    The risks benefits and alternatives were discussed with the patient including but not limited to the risks of nonoperative treatment, versus surgical intervention including infection, bleeding, nerve injury, malunion, nonunion, hardware prominence, hardware failure, need for hardware removal, blood clots, cardiopulmonary complications, morbidity, mortality, among others, and they were willing to proceed.    OPERATIVE PROCEDURE:  The patient was brought to the operating room and placed in the supine position.  Spinal anesthesia was administered . He was placed on the fracture table.  Closed reduction was performed under C-arm guidance. The length of the femur was also measured using fluoroscopy. Time out was then performed after sterile prep and drape. He received preoperative antibiotics.  Incision was made proximal to the greater trochanter. A guidewire was placed in the appropriate position. Confirmation was made on AP and lateral views. The above-named nail was opened. I opened the proximal femur with a reamer. I then placed the nail by hand easily down. I did not need to ream the femur.  Once the nail was completely seated, I placed a guidepin into the femoral head into the center center position through  a second incision.  I measured the length, and then reamed the lateral cortex and up into the head. I then placed the helical blade. Slight compression was applied. Anatomic fixation achieved. Bone quality was mediocre.  I then secured the proximal interlock.  The distal locking screw was then placed and after confirming the position of the fracture fragments and hardware I then removed the instruments, and took final C-arm pictures AP and lateral the entire length of the leg. Anatomic reconstruction was achieved, and the wounds were irrigated copiously and closed with Vicryl  followed by staples and dry sterile dressing. Sponge and needle count were correct.   The patient was awakened and returned to PACU in stable and satisfactory condition. There no complications and the patient tolerated the procedure well.  He will be partial weightbearing as tolerated, and will be on Lovenox  For DVT prophylaxis.     Park Breed, M.D.

## 2020-07-21 NOTE — Anesthesia Procedure Notes (Signed)
Date/Time: 07/21/2020 9:10 AM Performed by: Nelda Marseille, CRNA Pre-anesthesia Checklist: Patient identified, Emergency Drugs available, Suction available, Patient being monitored and Timeout performed Oxygen Delivery Method: Simple face mask

## 2020-07-21 NOTE — Transfer of Care (Signed)
Immediate Anesthesia Transfer of Care Note  Patient: Dustin Howell  Procedure(s) Performed: INTRAMEDULLARY (IM) NAIL INTERTROCHANTRIC (Right Hip)  Patient Location: PACU  Anesthesia Type:Spinal  Level of Consciousness: awake, alert  and oriented  Airway & Oxygen Therapy: Patient Spontanous Breathing and Patient connected to face mask oxygen  Post-op Assessment: Report given to RN and Post -op Vital signs reviewed and stable  Post vital signs: Reviewed and stable  Last Vitals:  Vitals Value Taken Time  BP 98/60 07/21/20 0945  Temp    Pulse 72 07/21/20 0947  Resp 20 07/21/20 0947  SpO2 98 % 07/21/20 0947  Vitals shown include unvalidated device data.  Last Pain:  Vitals:   07/21/20 0758  TempSrc:   PainSc: 3          Complications: No complications documented.

## 2020-07-22 DIAGNOSIS — D62 Acute posthemorrhagic anemia: Secondary | ICD-10-CM

## 2020-07-22 DIAGNOSIS — S72001S Fracture of unspecified part of neck of right femur, sequela: Secondary | ICD-10-CM | POA: Diagnosis not present

## 2020-07-22 DIAGNOSIS — D479 Neoplasm of uncertain behavior of lymphoid, hematopoietic and related tissue, unspecified: Secondary | ICD-10-CM | POA: Diagnosis not present

## 2020-07-22 DIAGNOSIS — S0101XA Laceration without foreign body of scalp, initial encounter: Secondary | ICD-10-CM

## 2020-07-22 DIAGNOSIS — G9341 Metabolic encephalopathy: Secondary | ICD-10-CM | POA: Diagnosis not present

## 2020-07-22 DIAGNOSIS — E43 Unspecified severe protein-calorie malnutrition: Secondary | ICD-10-CM | POA: Insufficient documentation

## 2020-07-22 LAB — CBC WITH DIFFERENTIAL/PLATELET
Abs Immature Granulocytes: 0.18 10*3/uL — ABNORMAL HIGH (ref 0.00–0.07)
Basophils Absolute: 0 10*3/uL (ref 0.0–0.1)
Basophils Relative: 0 %
Eosinophils Absolute: 0 10*3/uL (ref 0.0–0.5)
Eosinophils Relative: 0 %
HCT: 19.7 % — ABNORMAL LOW (ref 39.0–52.0)
Hemoglobin: 6.4 g/dL — ABNORMAL LOW (ref 13.0–17.0)
Immature Granulocytes: 1 %
Lymphocytes Relative: 27 %
Lymphs Abs: 5.1 10*3/uL — ABNORMAL HIGH (ref 0.7–4.0)
MCH: 30.3 pg (ref 26.0–34.0)
MCHC: 32.5 g/dL (ref 30.0–36.0)
MCV: 93.4 fL (ref 80.0–100.0)
Monocytes Absolute: 4.9 10*3/uL — ABNORMAL HIGH (ref 0.1–1.0)
Monocytes Relative: 26 %
Neutro Abs: 8.6 10*3/uL — ABNORMAL HIGH (ref 1.7–7.7)
Neutrophils Relative %: 46 %
Platelets: 80 10*3/uL — ABNORMAL LOW (ref 150–400)
RBC: 2.11 MIL/uL — ABNORMAL LOW (ref 4.22–5.81)
RDW: 17.2 % — ABNORMAL HIGH (ref 11.5–15.5)
Smear Review: NORMAL
WBC: 18.4 10*3/uL — ABNORMAL HIGH (ref 4.0–10.5)
nRBC: 0 % (ref 0.0–0.2)

## 2020-07-22 LAB — HEMOGLOBIN AND HEMATOCRIT, BLOOD
HCT: 24.5 % — ABNORMAL LOW (ref 39.0–52.0)
Hemoglobin: 8.2 g/dL — ABNORMAL LOW (ref 13.0–17.0)

## 2020-07-22 LAB — HEPATITIS C ANTIBODY: HCV Ab: NONREACTIVE

## 2020-07-22 MED ORDER — ACETAMINOPHEN 325 MG PO TABS
650.0000 mg | ORAL_TABLET | Freq: Once | ORAL | Status: AC
  Administered 2020-07-22: 650 mg via ORAL
  Filled 2020-07-22: qty 2

## 2020-07-22 MED ORDER — TRAZODONE HCL 50 MG PO TABS
50.0000 mg | ORAL_TABLET | Freq: Every evening | ORAL | Status: DC | PRN
  Administered 2020-07-24: 50 mg via ORAL
  Filled 2020-07-22: qty 1

## 2020-07-22 MED ORDER — ENSURE ENLIVE PO LIQD
237.0000 mL | Freq: Two times a day (BID) | ORAL | Status: DC
  Administered 2020-07-23 – 2020-07-26 (×6): 237 mL via ORAL

## 2020-07-22 MED ORDER — SODIUM CHLORIDE 0.9 % IV SOLN
400.0000 mg | Freq: Once | INTRAVENOUS | Status: AC
  Administered 2020-07-22: 400 mg via INTRAVENOUS
  Filled 2020-07-22: qty 20

## 2020-07-22 MED ORDER — CEPHALEXIN 250 MG PO CAPS
250.0000 mg | ORAL_CAPSULE | Freq: Three times a day (TID) | ORAL | Status: DC
  Administered 2020-07-22 – 2020-07-26 (×11): 250 mg via ORAL
  Filled 2020-07-22 (×14): qty 1

## 2020-07-22 MED ORDER — ADULT MULTIVITAMIN W/MINERALS CH
1.0000 | ORAL_TABLET | Freq: Every day | ORAL | Status: DC
  Administered 2020-07-23 – 2020-07-26 (×4): 1 via ORAL
  Filled 2020-07-22 (×4): qty 1

## 2020-07-22 MED ORDER — SODIUM CHLORIDE 0.9% IV SOLUTION: Freq: Once | INTRAVENOUS | Status: AC

## 2020-07-22 MED ORDER — FUROSEMIDE 10 MG/ML IJ SOLN
40.0000 mg | Freq: Once | INTRAMUSCULAR | Status: AC
  Administered 2020-07-22: 40 mg via INTRAVENOUS
  Filled 2020-07-22: qty 4

## 2020-07-22 NOTE — Progress Notes (Signed)
PT Cancellation Note  Patient Details Name: Dustin Howell MRN: [id number] DOB: 01-10-1925   Cancelled Treatment:    Reason Eval/Treat Not Completed: Medical issues which prohibited therapy. Patient has HGB of 6.4 which outside of appropriate range for PT. Will be getting transfusion. Plan to see this afternoon if appropriate.    Glinda Natzke 07/22/2020, 9:06 AM

## 2020-07-22 NOTE — Progress Notes (Signed)
Subjective: 1 Day Post-Op Procedure(s) (LRB): INTRAMEDULLARY (IM) NAIL INTERTROCHANTRIC (Right) The patient is awake and alert and sitting up in bed.  His hemoglobin is dropped to 6.4 and will be transfused a unit of blood today.  Range of motion of the hip is satisfactory.  His daughter is present today.  Patient reports pain as mild.  Objective:   VITALS:   Vitals:   07/22/20 0754 07/22/20 1130  BP: 125/87 (!) 121/54  Pulse: 100 87  Resp: 18 17  Temp: 97.9 F (36.6 C) 97.9 F (36.6 C)  SpO2: 100% 99%    Neurologically intact Sensation intact distally Dorsiflexion/Plantar flexion intact Incision: dressing C/D/I  LABS Recent Labs    07/21/20 0534 07/21/20 1051 07/22/20 0554  HGB 7.8* 8.5* 6.4*  HCT 24.3* 26.7* 19.7*  WBC 14.5* 18.1* 18.4*  PLT 101* 87* 80*    Recent Labs    07/20/20 1306 07/21/20 0534  NA 137 141  K 3.9 4.9  BUN 22 30*  CREATININE 0.95 1.17  GLUCOSE 121* 137*    Recent Labs    07/20/20 1309  INR 1.0     Assessment/Plan: 1 Day Post-Op Procedure(s) (LRB): INTRAMEDULLARY (IM) NAIL INTERTROCHANTRIC (Right)   Advance diet Up with therapy Discharge to SNF when stable   He will be partial weightbearing on the right leg with a walker  Enteric-coated aspirin twice daily for 6 weeks on discharge  Follow-up in my office in 2 weeks for x-ray and staple removal

## 2020-07-22 NOTE — Progress Notes (Signed)
PT Cancellation Note  Patient Details Name: Dustin Howell MRN: [id number] DOB: [birth year]   Cancelled Treatment:    Reason Eval/Treat Not Completed: Medical issues which prohibited therapy. Patient has not had transfusion yet, blood is not ready per RN. Dr. Sabra Heck says to hold until tomorrow. Will evaluate tomorrow as appropriate.       Dustin Howell 07/22/2020, 11:41 AM

## 2020-07-22 NOTE — Plan of Care (Signed)
Pt watching TV at this time and eating supper,NAD noted.Pt denies any pain or discomfort.Pt received 1 unit of PRBC earlier,and iron sucrose 400mg  infusing via PIV on rt ac now.Appetite and fluid intake fair.Pt progressing with care plans.no changes in baseline.will cont to monitor.

## 2020-07-22 NOTE — Progress Notes (Signed)
Initial Nutrition Assessment  DOCUMENTATION CODES:   Severe malnutrition in context of chronic illness  INTERVENTION:  Downgrade diet to dysphagia 3.  Provide Ensure Enlive po BID, each supplement provides 350 kcal and 20 grams of protein.  Provide Magic cup BID with lunch and dinner, each supplement provides 290 kcal and 9 grams of protein.  Provide MVI po daily.  NUTRITION DIAGNOSIS:   Severe Malnutrition related to chronic illness (CKD, advanced age) as evidenced by severe fat depletion,severe muscle depletion.  GOAL:   Patient will meet greater than or equal to 90% of their needs  MONITOR:   PO intake,Supplement acceptance,Labs,Weight trends,Skin,I & O's  REASON FOR ASSESSMENT:   Consult Assessment of nutrition requirement/status  ASSESSMENT:   87 year old male with PMHx of GI bleed, GERD, CKD stage II, lymphoproliferative disorder, iron deficiency anemia admitted with right hip fracture s/p operative repair 1/9, acute metabolic encephalopathy.   Met with patient at bedside. He reports PTA he had a good appetite and intake. He reports he would eat 2 good meals per day though he is unable to provide any further details than that. He reports here in the hospital his appetite has been decreased. He also endorses difficulty chewing meals. Per review of chart patient ate 60% of lunch yesterday and 25% of breakfast this morning. He is amenable to drinking ONS to help meet calorie/protein needs.  Patient reports his UBW was 130 lbs for most of his adult life. He reports he used to work in [employer] and always remained active. Per review of chart patient was 61.7 kg on 05/19/2016. He is now 47.6 kg (105 lbs). Weight loss appears to have occurred slowly over time.  Medications reviewed and include: ferrous sulfate 325 mg BID with meals, senna 1 tablet BID, iron sucrose 400 mg IV once today.  Labs reviewed.  NUTRITION - FOCUSED PHYSICAL EXAM:  Flowsheet Row Most Recent Value   Orbital Region Severe depletion  Upper Arm Region Severe depletion  Thoracic and Lumbar Region Moderate depletion  Buccal Region Severe depletion  Temple Region Severe depletion  Clavicle Bone Region Severe depletion  Clavicle and Acromion Bone Region Severe depletion  Scapular Bone Region Severe depletion  Dorsal Hand Severe depletion  Patellar Region Severe depletion  Anterior Thigh Region Severe depletion  Posterior Calf Region Severe depletion  Edema (RD Assessment) None  Hair Reviewed  Eyes Reviewed  Mouth Reviewed  [poor dentition]  Skin Reviewed  Nails Reviewed     Diet Order:   Diet Order            Diet regular Room service appropriate? Yes; Fluid consistency: Thin  Diet effective now                EDUCATION NEEDS:   No education needs have been identified at this time  Skin:  Skin Assessment: Skin Integrity Issues: Skin Integrity Issues:: Incisions Incisions: closed incision right hip  Last BM:  07/20/2019 per chart  Height:   Ht Readings from Last 1 Encounters:  07/20/20 5' 7"  (1.702 m)   Weight:   Wt Readings from Last 1 Encounters:  07/20/20 47.6 kg   Ideal Body Weight:  67.3 kg  BMI:  Body mass index is 16.45 kg/m.  Estimated Nutritional Needs:   Kcal:  1400-1600  Protein:  70-80 grams  Fluid:  1.2-1.4 L/day  Jacklynn Barnacle, MS, RD, LDN Pager number available on Amion

## 2020-07-22 NOTE — Progress Notes (Signed)
Patient ID: Dustin Howell, male   DOB: 07/01/1925, 87 y.o.   MRN: [id number] [facility] PROGRESS NOTE  [place] Mamie Nick Borba [id number] DOB: [birth year] DOA: 07/20/2020 PCP: [name], MD  HPI/Subjective: Patient complains of right hip pain.  He states that he cannot move around much.  I had to reorient him that he was in the hospital and that he broke his hip and it out operation yesterday.  Patient came in after fall and found to have a broken hip.  Objective: Vitals:   07/22/20 1315 07/22/20 1352  BP: (!) 137/55 (!) 120/58  Pulse: 89 88  Resp: 16 16  Temp: (!) 97.5 F (36.4 C) 98.1 F (36.7 C)  SpO2: 98% 99%    Intake/Output Summary (Last 24 hours) at 07/22/2020 1528 Last data filed at 07/22/2020 1334 Gross per 24 hour  Intake 900 ml  Output 900 ml  Net 0 ml   Filed Weights   07/20/20 1227  Weight: 47.6 kg    ROS: Review of Systems  Respiratory: Negative for shortness of breath.   Cardiovascular: Negative for chest pain.  Gastrointestinal: Negative for abdominal pain, nausea and vomiting.   Exam: Physical Exam HENT:     Head: Normocephalic.     Mouth/Throat:     Pharynx: No oropharyngeal exudate.  Eyes:     General: Lids are normal.     Conjunctiva/sclera: Conjunctivae normal.     Pupils: Pupils are equal, round, and reactive to light.  Cardiovascular:     Rate and Rhythm: Normal rate and regular rhythm.     Heart sounds: Normal heart sounds, S1 normal and S2 normal.  Pulmonary:     Breath sounds: No decreased breath sounds, wheezing, rhonchi or rales.  Abdominal:     Palpations: Abdomen is soft.     Tenderness: There is no abdominal tenderness.  Musculoskeletal:     Right ankle: No swelling.     Left ankle: No swelling.  Skin:    General: Skin is warm.     Findings: No rash.  Neurological:     Mental Status: He is alert.     Comments: Some confusion with his answers and had to reorient him.       Data Reviewed: Basic Metabolic Panel: Recent  Labs  Lab 07/20/20 1306 07/21/20 0534  NA 137 141  K 3.9 4.9  CL 102 108  CO2 25 26  GLUCOSE 121* 137*  BUN 22 30*  CREATININE 0.95 1.17  CALCIUM 9.1 8.7*   CBC: Recent Labs  Lab 07/20/20 1306 07/21/20 0534 07/21/20 1051 07/22/20 0554  WBC 9.0 14.5* 18.1* 18.4*  NEUTROABS 4.6  --   --  8.6*  HGB 10.3* 7.8* 8.5* 6.4*  HCT 32.3* 24.3* 26.7* 19.7*  MCV 95.8 95.3 95.7 93.4  PLT 127* 101* 87* 80*     Recent Results (from the past 240 hour(s))  SARS CORONAVIRUS 2 (TAT 6-24 HRS) Nasopharyngeal Nasopharyngeal Swab     Status: None   Collection Time: 07/20/20 12:45 PM   Specimen: Nasopharyngeal Swab  Result Value Ref Range Status   SARS Coronavirus 2 NEGATIVE NEGATIVE Final    Comment: (NOTE) SARS-CoV-2 target nucleic acids are NOT DETECTED.  The SARS-CoV-2 RNA is generally detectable in upper and lower respiratory specimens during the acute phase of infection. Negative results do not preclude SARS-CoV-2 infection, do not rule out co-infections with other pathogens, and should not be used as the sole basis for treatment or other patient management decisions.  Negative results must be combined with clinical observations, patient history, and epidemiological information. The expected result is Negative.  Fact Sheet for Patients: SugarRoll.be  Fact Sheet for Healthcare Providers: https://www.woods-mathews.com/  This test is not yet approved or cleared by the [place] FDA and  has been authorized for detection and/or diagnosis of SARS-CoV-2 by FDA under an Emergency Use Authorization (EUA). This EUA will remain  in effect (meaning this test can be used) for the duration of the COVID-19 declaration under Se ction 564(b)(1) of the Act, 21 U.S.C. section 360bbb-3(b)(1), unless the authorization is terminated or revoked sooner.  Performed at [facility], [place] [address], [place], [place] [address]   Surgical PCR screen      Status: Abnormal   Collection Time: 07/21/20 12:06 AM   Specimen: Nasal Mucosa; Nasal Swab  Result Value Ref Range Status   MRSA, PCR NEGATIVE NEGATIVE Final   Staphylococcus aureus POSITIVE (A) NEGATIVE Final    Comment: (NOTE) The Xpert SA Assay (FDA approved for NASAL specimens in patients 70 years of age and older), is one component of a comprehensive surveillance program. It is not intended to diagnose infection nor to guide or monitor treatment. Performed at [facility], [address] [place], [place] [address]      Studies: [place] HIP OPERATIVE UNILAT W OR W/O PELVIS RIGHT  Result Date: 07/21/2020 CLINICAL DATA:  Right hip intertrochanteric fracture, operative fixation EXAM: OPERATIVE RIGHT HIP (WITH PELVIS IF PERFORMED) 3 VIEWS TECHNIQUE: Fluoroscopic spot image(s) were submitted for interpretation post-operatively. COMPARISON:  07/20/2020 FINDINGS: Spot fluoroscopic intraoperative views demonstrate fixation pen and intramedullary rod reducing the right hip intertrochanteric fracture. Fracture lines remain visible. Lesser trochanter fragment remains displaced. Anatomic alignment. No associated subluxation or dislocation. Bones are osteopenic. Peripheral atherosclerosis noted. IMPRESSION: Status post ORIF of the right hip intertrochanteric fracture with improved anatomic alignment. Electronically Signed   By: Jerilynn Mages.  Shick M.D.   On: 07/21/2020 09:41    Scheduled Meds: . amLODipine  5 mg Oral Daily  . atorvastatin  10 mg Oral Daily  . enoxaparin (LOVENOX) injection  30 mg Subcutaneous Q24H  . ferrous sulfate  325 mg Oral BID WC  . mupirocin ointment  1 application Nasal BID  . senna  1 tablet Oral BID   Continuous Infusions: . iron sucrose      Assessment/Plan:  1. Postoperative anemia with iron deficiency anemia.  We will transfuse a unit of packed red blood cells with hemoglobin dropping down to 6.4 today.  We will also give IV iron later today.  Continue ferrous  sulfate.  Hemoglobin after transfusion and again tomorrow morning. 2. Closed right hip fracture status post operative repair.  Postoperative day 1.  ORIF yesterday by Dr. Sabra Heck.  As needed pain medication. 3. Acute metabolic encephalopathy likely from surgery pain medications anesthesia and being in an unfamiliar environment.  Continue to monitor. 4. 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 5. History of CAD on atorvastatin 6. Essential hypertension on Norvasc 7. Hyperlipidemia unspecified on atorvastatin 8. Scalp laceration secondary to trauma and fall.  Received Ancef with preop will put on Keflex now.  CT scan of the head negative for bleed 9. Chronic kidney disease stage II        Code Status:     Code Status Orders  (From admission, onward)         Start     Ordered   07/20/20 1542  Full code  Continuous  07/20/20 1547        Code Status History    Date Active Date Inactive Code Status Order ID Comments User Context   06/20/2020 1600 06/23/2020 2258 Full Code [id number]  Marianna Payment, MD ED   04/21/2017 1010 04/22/2017 1333 Full Code [id number]  Isaias Cowman, MD Inpatient   05/11/2016 2340 05/19/2016 1758 Full Code [id number]  Corey Harold, NP Inpatient   Advance Care Planning Activity     Family Communication: Spoke with daughter on the phone Disposition Plan: Status is: Inpatient  Dispo: The patient is from: Home              Anticipated d/c is to: Rehab              Anticipated d/c date is: Earliest potential be 07/24/2020              Patient currently postoperative day 1  Consultants:  Orthopedic surgery  Procedures:  Right ORIF  Antibiotics:  Will place on p.o. Keflex  Time spent: 28 minutes  [place]

## 2020-07-23 ENCOUNTER — Encounter: Payer: Self-pay | Admitting: Specialist

## 2020-07-23 DIAGNOSIS — D62 Acute posthemorrhagic anemia: Secondary | ICD-10-CM | POA: Diagnosis not present

## 2020-07-23 DIAGNOSIS — S72001S Fracture of unspecified part of neck of right femur, sequela: Secondary | ICD-10-CM | POA: Diagnosis not present

## 2020-07-23 DIAGNOSIS — G9341 Metabolic encephalopathy: Secondary | ICD-10-CM | POA: Diagnosis not present

## 2020-07-23 DIAGNOSIS — E43 Unspecified severe protein-calorie malnutrition: Secondary | ICD-10-CM

## 2020-07-23 DIAGNOSIS — D479 Neoplasm of uncertain behavior of lymphoid, hematopoietic and related tissue, unspecified: Secondary | ICD-10-CM | POA: Diagnosis not present

## 2020-07-23 LAB — CBC
HCT: 22.8 % — ABNORMAL LOW (ref 39.0–52.0)
Hemoglobin: 7.7 g/dL — ABNORMAL LOW (ref 13.0–17.0)
MCH: 30.2 pg (ref 26.0–34.0)
MCHC: 33.8 g/dL (ref 30.0–36.0)
MCV: 89.4 fL (ref 80.0–100.0)
Platelets: 71 10*3/uL — ABNORMAL LOW (ref 150–400)
RBC: 2.55 MIL/uL — ABNORMAL LOW (ref 4.22–5.81)
RDW: 18.1 % — ABNORMAL HIGH (ref 11.5–15.5)
WBC: 14.5 10*3/uL — ABNORMAL HIGH (ref 4.0–10.5)
nRBC: 0.1 % (ref 0.0–0.2)

## 2020-07-23 LAB — BPAM RBC
Blood Product Expiration Date: 202201112359
Blood Product Expiration Date: 202201242359
Blood Product Expiration Date: 202202092359
Blood Product Expiration Date: 202202142359
ISSUE DATE / TIME: 202201090828
ISSUE DATE / TIME: 202201090828
ISSUE DATE / TIME: 202201101309
ISSUE DATE / TIME: 202201111051
Unit Type and Rh: 5100
Unit Type and Rh: 5100
Unit Type and Rh: 5100
Unit Type and Rh: 9500

## 2020-07-23 LAB — TYPE AND SCREEN
ABO/RH(D): O POS
Antibody Screen: NEGATIVE
Unit division: 0
Unit division: 0
Unit division: 0
Unit division: 0

## 2020-07-23 LAB — BASIC METABOLIC PANEL
Anion gap: 11 (ref 5–15)
BUN: 25 mg/dL — ABNORMAL HIGH (ref 8–23)
CO2: 24 mmol/L (ref 22–32)
Calcium: 8.2 mg/dL — ABNORMAL LOW (ref 8.9–10.3)
Chloride: 101 mmol/L (ref 98–111)
Creatinine, Ser: 1.06 mg/dL (ref 0.61–1.24)
GFR, Estimated: >60 mL/min (ref >60)
Glucose, Bld: 99 mg/dL (ref 70–99)
Potassium: 3.9 mmol/L (ref 3.5–5.1)
Sodium: 136 mmol/L (ref 135–145)

## 2020-07-23 LAB — PATHOLOGIST SMEAR REVIEW

## 2020-07-23 LAB — PREPARE RBC (CROSSMATCH)

## 2020-07-23 LAB — GLUCOSE, CAPILLARY: Glucose-Capillary: 98 mg/dL (ref 70–99)

## 2020-07-23 NOTE — Progress Notes (Signed)
Patient ID: Dustin Howell, male   DOB: [birth year], 87 y.o.   MRN: [id number] [facility] PROGRESS NOTE  Dustin Howell [id number] DOB: [birth year] DOA: 07/20/2020 PCP: [name], MD  HPI/Subjective:   Objective: Vitals:   07/23/20 0733 07/23/20 1113  BP: (!) 135/57 (!) 152/65  Pulse: 84 96  Resp: 16 18  Temp: 98 F (36.7 C) 99.1 F (37.3 C)  SpO2: 98% 98%    Intake/Output Summary (Last 24 hours) at 07/23/2020 1440 Last data filed at 07/23/2020 1406 Gross per 24 hour  Intake 862 ml  Output 650 ml  Net 212 ml   Filed Weights   07/20/20 1227  Weight: 47.6 kg    ROS: Review of Systems  Respiratory: Negative for shortness of breath.   Cardiovascular: Negative for chest pain.  Gastrointestinal: Negative for abdominal pain, nausea and vomiting.  Musculoskeletal: Positive for joint pain.   Exam: Physical Exam HENT:     Head: Normocephalic.     Mouth/Throat:     Pharynx: No oropharyngeal exudate.  Eyes:     General: Lids are normal.     Conjunctiva/sclera: Conjunctivae normal.     Pupils: Pupils are equal, round, and reactive to light.  Cardiovascular:     Rate and Rhythm: Normal rate and regular rhythm.     Heart sounds: S1 normal and S2 normal. Murmur heard.   Systolic murmur is present with a grade of 2/6.   Pulmonary:     Breath sounds: Examination of the right-lower field reveals decreased breath sounds. Examination of the left-lower field reveals decreased breath sounds. Decreased breath sounds present. No wheezing, rhonchi or rales.  Abdominal:     Palpations: Abdomen is soft.     Tenderness: There is no abdominal tenderness.  Musculoskeletal:     Right lower leg: No swelling.     Left lower leg: No swelling.  Skin:    General: Skin is warm.     Findings: No rash.  Neurological:     Mental Status: He is alert.       Data Reviewed: Basic Metabolic Panel: Recent Labs  Lab 07/20/20 1306 07/21/20 0534 07/23/20 0616  NA 137 141 136  K 3.9  4.9 3.9  CL 102 108 101  CO2 25 26 24   GLUCOSE 121* 137* 99  BUN 22 30* 25*  CREATININE 0.95 1.17 1.06  CALCIUM 9.1 8.7* 8.2*   CBC: Recent Labs  Lab 07/20/20 1306 07/21/20 0534 07/21/20 1051 07/22/20 0554 07/22/20 1734 07/23/20 0616  WBC 9.0 14.5* 18.1* 18.4*  --  14.5*  NEUTROABS 4.6  --   --  8.6*  --   --   HGB 10.3* 7.8* 8.5* 6.4* 8.2* 7.7*  HCT 32.3* 24.3* 26.7* 19.7* 24.5* 22.8*  MCV 95.8 95.3 95.7 93.4  --  89.4  PLT 127* 101* 87* 80*  --  71*   CBG: Recent Labs  Lab 07/23/20 0738  GLUCAP 98    Recent Results (from the past 240 hour(s))  SARS CORONAVIRUS 2 (TAT 6-24 HRS) Nasopharyngeal Nasopharyngeal Swab     Status: None   Collection Time: 07/20/20 12:45 PM   Specimen: Nasopharyngeal Swab  Result Value Ref Range Status   SARS Coronavirus 2 NEGATIVE NEGATIVE Final    Comment: (NOTE) SARS-CoV-2 target nucleic acids are NOT DETECTED.  The SARS-CoV-2 RNA is generally detectable in upper and lower respiratory specimens during the acute phase of infection. Negative results do not preclude SARS-CoV-2 infection, do not rule out co-infections  with other pathogens, and should not be used as the sole basis for treatment or other patient management decisions. Negative results must be combined with clinical observations, patient history, and epidemiological information. The expected result is Negative.  Fact Sheet for Patients: SugarRoll.be  Fact Sheet for Healthcare Providers: https://www.woods-mathews.com/  This test is not yet approved or cleared by the [place] FDA and  has been authorized for detection and/or diagnosis of SARS-CoV-2 by FDA under an Emergency Use Authorization (EUA). This EUA will remain  in effect (meaning this test can be used) for the duration of the COVID-19 declaration under Se ction 564(b)(1) of the Act, 21 U.S.C. section 360bbb-3(b)(1), unless the authorization is terminated or revoked  sooner.  Performed at [facility], [place] [address], [place], [place] [address]   Surgical PCR screen     Status: Abnormal   Collection Time: 07/21/20 12:06 AM   Specimen: Nasal Mucosa; Nasal Swab  Result Value Ref Range Status   MRSA, PCR NEGATIVE NEGATIVE Final   Staphylococcus aureus POSITIVE (A) NEGATIVE Final    Comment: (NOTE) The Xpert SA Assay (FDA approved for NASAL specimens in patients 75 years of age and older), is one component of a comprehensive surveillance program. It is not intended to diagnose infection nor to guide or monitor treatment. Performed at [facility], [address] [place],  [address]      Scheduled Meds: . amLODipine  5 mg Oral Daily  . atorvastatin  10 mg Oral Daily  . cephALEXin  250 mg Oral Q8H  . enoxaparin (LOVENOX) injection  30 mg Subcutaneous Q24H  . feeding supplement  237 mL Oral BID BM  . ferrous sulfate  325 mg Oral BID WC  . multivitamin with minerals  1 tablet Oral Daily  . mupirocin ointment  1 application Nasal BID  . senna  1 tablet Oral BID   Brief history: Patient admitted on 07/20/2020 with fall and found to have a right hip fracture.  Patient had ORIF by Dr. Sabra Heck on 07/21/2020.  Patient had postoperative anemia with hemoglobin dropping down to 6.4 and was given 1 unit of irradiated blood.  Hemoglobin today 7.7.  Out to rehab once rehab bed available and hemoglobin stable.  Assessment/Plan:  1. Postoperative acute blood loss anemia with history of iron deficiency anemia.  Hemoglobin responded to 1 unit of packed red blood cells given on 07/23/2019.  Hemoglobin 7.7 this morning.  Patient also received IV iron last night.  Recheck hemoglobin tomorrow morning. 2. Closed right hip fracture status post operative repair by Dr. Sabra Heck.  Today's postoperative day 2.  ORIF by Dr. Sabra Heck.  In Dr. Ammie Ferrier note partial weightbearing right leg with walker.  He recommended aspirin enteric-coated twice a day for 6 weeks  postop.  Currently on Lovenox.  With history of recent GI bleed 3. Acute metabolic encephalopathy from surgery, anesthesia and pain medications as better today. 4. Lymphoproliferative disorder with leukocytosis, chronic thrombocytopenia and anemia.  Follows up with Dr. Janese Banks as outpatient 5. History of CAD on atorvastatin 6. Hyperlipidemia unspecified on atorvastatin 7. Scalp laceration secondary to trauma and fall.  Received IV Ancef preop will have on Keflex for now.  CT scan of the head negative for bleed 8. Chronic kidney disease stage II 9. Severe protein calorie malnutrition as per dietitian.  Daughter states that his appetite has not come back yet.  Ensure        Code Status:     Code  Status Orders  (From admission, onward)         Start     Ordered   07/20/20 1542  Full code  Continuous        07/20/20 1547        Code Status History    Date Active Date Inactive Code Status Order ID Comments User Context   06/20/2020 1600 06/23/2020 2258 Full Code [id number]  Marianna Payment, MD ED   04/21/2017 1010 04/22/2017 1333 Full Code [id number]  Isaias Cowman, MD Inpatient   05/11/2016 2340 05/19/2016 1758 Full Code [id number]  Corey Harold, NP Inpatient   Advance Care Planning Activity     Family Communication: Spoke with daughter on the phone Disposition Plan: Status is: Inpatient  Dispo: The patient is from: Home              Anticipated d/c is to: Rehab              Anticipated d/c date is: Rehab bed will have to be obtained and hemoglobin must be stable.              Patient currently postoperative day 2 for hip fracture.  Time spent: 26 minutes  [place]

## 2020-07-23 NOTE — Progress Notes (Signed)
Physical Therapy Treatment Patient Details Name: Dustin Howell MRN: [id number] DOB: 08-Jun-1925 Today's Date: 07/23/2020    History of Present Illness Dustin Howell is an 87 y.o. male.  At baseline, patient lives by self and is ADL independent.  He had been in his usual state of health until today when he tripped over a rug in his home.  He lost his balance and fell backwards.  He fell on his right hip and also bumped his head.  Post fall, patient was unable to get up due to pain and loss of function.  He was able to call family members with his CELL phone.  Patient has a significant past medical history of chronic anemia, recurrent lower GI bleeding with findings of AVMs and diverticulosis on flex sigmoidoscopy on his last admission 1 month ago.    PT Comments    Patient received in recliner. Reports left LE hurting more than right currently. Reports 9/10 on left 4/10 on right. Pain improved with mobility. Patient requires +2 mod assist for sitting up at edge of recliner. Max assist for pivot transfer from recliner to bed. +2 max assist for scooting in bed/positioning. Patient will continue to benefit from skilled PT while here to improve functional independence and safety with mobility.      Follow Up Recommendations  SNF;Supervision/Assistance - 24 hour     Equipment Recommendations  Other (comment) (TBD)    Recommendations for Other Services       Precautions / Restrictions Precautions Precautions: Fall Restrictions Weight Bearing Restrictions: Yes RLE Weight Bearing: Partial weight bearing    Mobility  Bed Mobility Overal bed mobility: Needs Assistance Bed Mobility: Sit to Supine     Supine to sit: Max assist Sit to supine: Min assist;+2 for physical assistance   General bed mobility comments: min/mod assist +2 for sit to supine and for positioning in bed.  Transfers Overall transfer level: Needs assistance Equipment used: None Transfers: Squat Pivot Transfers     Squat  pivot transfers: Max assist     General transfer comment: Max assist to transfer to his left to get back into bed.  Ambulation/Gait             General Gait Details: unable to take steps this visit   Stairs             Wheelchair Mobility    Modified Rankin (Stroke Patients Only)       Balance Overall balance assessment: Needs assistance Sitting-balance support: Feet supported Sitting balance-Leahy Scale: Fair Sitting balance - Comments: requires min assist for safe sitting balance at edge of bed.   Standing balance support: Bilateral upper extremity supported;During functional activity Standing balance-Leahy Scale: Poor Standing balance comment: able to stand for pivot to bed with B UE support                            Cognition Arousal/Alertness: Awake/alert Behavior During Therapy: WFL for tasks assessed/performed Overall Cognitive Status: Within Functional Limits for tasks assessed                                 General Comments: appears to have slight confusion      Exercises Total Joint Exercises Ankle Circles/Pumps: AROM;10 reps;Both Quad Sets: AROM;Right;5 reps Heel Slides: AAROM;Right;10 reps Hip ABduction/ADduction: AAROM;Right;10 reps    General Comments  Pertinent Vitals/Pain Pain Assessment: 0-10 Pain Score: 9  Faces Pain Scale: Hurts whole lot Pain Location: Reports 9/10 in left leg, 4/10 right leg while sitting in recliner. Reports pain improved once in bed Pain Descriptors / Indicators: Aching;Sore;Discomfort;Guarding;Grimacing;Moaning Pain Intervention(s): Monitored during session;Limited activity within patient's tolerance;Repositioned    Home Living Family/patient expects to be discharged to:: Skilled nursing facility Living Arrangements: Alone                  Prior Function Level of Independence: Independent      Comments: daughter states he has walking sticks when he goes out of  the house, but in the house he did not use anything.   PT Goals (current goals can now be found in the care plan section) Acute Rehab PT Goals Patient Stated Goal: none stated PT Goal Formulation: With patient Time For Goal Achievement: 08/06/20 Potential to Achieve Goals: Fair Progress towards PT goals: Progressing toward goals    Frequency    BID      PT Plan Current plan remains appropriate    Co-evaluation              AM-PAC PT "6 Clicks" Mobility   Outcome Measure  Help needed turning from your back to your side while in a flat bed without using bedrails?: A Lot Help needed moving from lying on your back to sitting on the side of a flat bed without using bedrails?: A Lot Help needed moving to and from a bed to a chair (including a wheelchair)?: A Lot Help needed standing up from a chair using your arms (e.g., wheelchair or bedside chair)?: A Lot Help needed to walk in hospital room?: Total Help needed climbing 3-5 steps with a railing? : Total 6 Click Score: 10    End of Session Equipment Utilized During Treatment: Gait belt Activity Tolerance: Patient limited by pain Patient left: in chair;with call bell/phone within reach;with chair alarm set Nurse Communication: Mobility status PT Visit Diagnosis: Other abnormalities of gait and mobility (R26.89);Pain;History of falling (Z91.81);Muscle weakness (generalized) (M62.81);Unsteadiness on feet (R26.81) Pain - Right/Left: Left (B LE) Pain - part of body: Hip;Leg     Time: 1310-1330 PT Time Calculation (min) (ACUTE ONLY): 20 min  Charges:  $Therapeutic Activity: 8-22 mins                     Aleister Lady, PT, GCS 07/23/20,1:41 PM

## 2020-07-23 NOTE — Evaluation (Signed)
Physical Therapy Evaluation Patient Details Name: Dustin Howell MRN: [id number] DOB: 08-Oct-1924 Today's Date: 07/23/2020   History of Present Illness  Osmani P Pasqua is an 87 y.o. male.  At baseline, patient lives by self and is ADL independent.  He had been in his usual state of health until today when he tripped over a rug in his home.  He lost his balance and fell backwards.  He fell on his right hip and also bumped his head.  Post fall, patient was unable to get up due to pain and loss of function.  He was able to call family members with his CELL phone.  Patient has a significant past medical history of chronic anemia, recurrent lower GI bleeding with findings of AVMs and diverticulosis on flex sigmoidoscopy on his last admission 1 month ago.  Clinical Impression  Patient received in bed. He is agreeable to PT session. Reports soreness in R LE. He requires max assist for supine to sit with heavy trunk support and support of R LE bringing off bed. He is able to assist with pivot transfer to recliner to his left side with max assist. Patient will continue to benefit from skilled PT while here to improve strength, functional independence and safety.      Follow Up Recommendations SNF;Supervision/Assistance - 24 hour    Equipment Recommendations  Other (comment) (TBD)    Recommendations for Other Services       Precautions / Restrictions Precautions Precautions: Fall Restrictions Weight Bearing Restrictions: Yes RLE Weight Bearing: Partial weight bearing      Mobility  Bed Mobility Overal bed mobility: Needs Assistance Bed Mobility: Supine to Sit     Supine to sit: Max assist     General bed mobility comments: patient requires max assist to get sitting on edge of bed. Difficulty raising trunk requiring max assist. Also required support of R LE off bed due to pain.    Transfers Overall transfer level: Needs assistance Equipment used: None Transfers: Squat Pivot Transfers      Squat pivot transfers: Max assist     General transfer comment: Patient transferred with max assist to recliner to his left side Good ability to weight bear on left LE and assist as able.  Ambulation/Gait             General Gait Details: unable to take steps this visit  Stairs            Wheelchair Mobility    Modified Rankin (Stroke Patients Only)       Balance Overall balance assessment: Needs assistance Sitting-balance support: Feet supported;Bilateral upper extremity supported Sitting balance-Leahy Scale: Fair Sitting balance - Comments: requires min assist for safe sitting balance at edge of bed.   Standing balance support: Bilateral upper extremity supported;During functional activity Standing balance-Leahy Scale: Zero Standing balance comment: patient unable to stand                             Pertinent Vitals/Pain Pain Assessment: Faces Faces Pain Scale: Hurts whole lot Pain Location: R LE with bed mobility Pain Descriptors / Indicators: Aching;Sore;Discomfort;Grimacing;Guarding;Moaning Pain Intervention(s): Monitored during session;Limited activity within patient's tolerance;Repositioned    Home Living Family/patient expects to be discharged to:: Skilled nursing facility Living Arrangements: Alone                    Prior Function Level of Independence: Independent  Comments: daughter states he has walking sticks when he goes out of the house, but in the house he did not use anything.     Hand Dominance        Extremity/Trunk Assessment   Upper Extremity Assessment Upper Extremity Assessment: Overall WFL for tasks assessed    Lower Extremity Assessment Lower Extremity Assessment: RLE deficits/detail RLE: Unable to fully assess due to pain RLE Sensation: WNL RLE Coordination: decreased gross motor    Cervical / Trunk Assessment Cervical / Trunk Assessment: Kyphotic  Communication   Communication: HOH   Cognition Arousal/Alertness: Awake/alert Behavior During Therapy: WFL for tasks assessed/performed Overall Cognitive Status: Within Functional Limits for tasks assessed                                 General Comments: maybe somewhat confused to time.      General Comments      Exercises Total Joint Exercises Ankle Circles/Pumps: AROM;10 reps;Both Quad Sets: AROM;5 reps;Both Heel Slides: AAROM;Right;10 reps Hip ABduction/ADduction: AAROM;10 reps;Right   Assessment/Plan    PT Assessment Patient needs continued PT services  PT Problem List Decreased strength;Decreased mobility;Decreased activity tolerance;Decreased balance;Pain;Decreased knowledge of precautions;Decreased knowledge of use of DME;Decreased safety awareness       PT Treatment Interventions DME instruction;Therapeutic activities;Therapeutic exercise;Balance training;Functional mobility training;Patient/family education;Gait training    PT Goals (Current goals can be found in the Care Plan section)  Acute Rehab PT Goals Patient Stated Goal: none stated PT Goal Formulation: With patient Time For Goal Achievement: 08/06/20 Potential to Achieve Goals: Fair    Frequency BID   Barriers to discharge Decreased caregiver support      Co-evaluation               AM-PAC PT "6 Clicks" Mobility  Outcome Measure Help needed turning from your back to your side while in a flat bed without using bedrails?: A Lot Help needed moving from lying on your back to sitting on the side of a flat bed without using bedrails?: A Lot Help needed moving to and from a bed to a chair (including a wheelchair)?: A Lot Help needed standing up from a chair using your arms (e.g., wheelchair or bedside chair)?: A Lot Help needed to walk in hospital room?: Total Help needed climbing 3-5 steps with a railing? : Total 6 Click Score: 10    End of Session Equipment Utilized During Treatment: Gait belt Activity Tolerance:  Patient limited by pain Patient left: in chair;with call bell/phone within reach;with chair alarm set Nurse Communication: Mobility status PT Visit Diagnosis: Other abnormalities of gait and mobility (R26.89);Pain;History of falling (Z91.81) Pain - Right/Left: Right Pain - part of body: Hip;Leg    Time: 9326-7124 PT Time Calculation (min) (ACUTE ONLY): 25 min   Charges:   PT Evaluation $PT Eval Moderate Complexity: 1 Mod PT Treatments $Therapeutic Activity: 8-22 mins        Jordanny Waddington, PT, GCS 07/23/20,10:50 AM

## 2020-07-23 NOTE — TOC Initial Note (Signed)
Transition of Care [facility]) - Initial/Assessment Note    Patient Details  Name: Dustin Howell MRN: [id number] Date of Birth: [birth year]  [facility]) CM/SW Contact:    Shelbie Ammons, RN Phone Number: 07/23/2020, 1:39 PM  Clinical Narrative:    RNCM met with patient in room to discuss discharge planning. Patient is status post surgery for right hip fracture. Patient is currently requiring max assist of 2 to transfer back to bed. Daughter is room and most of assessment is completed with her. She reports that patient lives at home alone and that family assists as they are able. She reports that they understand that he will need SNF at discharge but that he is will not be happy about it. She is agreeable to a search of all local facilities except for [facility] and [place].  RNCM verified PASSR, completed FL2 and started bed search.                Expected Discharge Plan: Skilled Nursing Facility Barriers to Discharge: No Barriers Identified   Patient Goals and CMS Choice        Expected Discharge Plan and Services Expected Discharge Plan: [place]       Living arrangements for the past 2 months: Single Family Home                                      Prior Living Arrangements/Services Living arrangements for the past 2 months: Single Family Home Lives with:: Self Patient language and need for interpreter reviewed:: Yes Do you feel safe going back to the place where you live?: Yes      Need for Family Participation in Patient Care: Yes (Comment) Care giver support system in place?: Yes (comment)   Criminal Activity/Legal Involvement Pertinent to Current Situation/Hospitalization: No - Comment as needed  Activities of Daily Living Home Assistive Devices/Equipment: Cane (specify quad or straight) ADL Screening (condition at time of admission) Patient's cognitive ability adequate to safely complete daily activities?: Yes Is  the patient deaf or have difficulty hearing?: Yes Does the patient have difficulty seeing, even when wearing glasses/contacts?: Yes Does the patient have difficulty concentrating, remembering, or making decisions?: No Patient able to express need for assistance with ADLs?: Yes Does the patient have difficulty dressing or bathing?: Yes Independently performs ADLs?: Yes (appropriate for developmental age) Does the patient have difficulty walking or climbing stairs?: No Weakness of Legs: None Weakness of Arms/Hands: None  Permission Sought/Granted                  Emotional Assessment Appearance:: Appears stated age       Alcohol / Substance Use: Not Applicable    Admission diagnosis:  Minor head injury, initial encounter [V49.44HQ] Fall, initial encounter [W19.XXXA] Closed fracture of right hip, initial encounter ([place]) [S72.001A] Laceration of scalp, initial encounter [S01.01XA] Hip fracture due to osteoporosis, initial encounter [facility]) [M80.059A] Patient Active Problem List   Diagnosis Date Noted  . Protein-calorie malnutrition, severe 07/22/2020  . Acute metabolic encephalopathy   . Iron deficiency anemia due to chronic blood loss   . Lymphoproliferative disorder ([place])   . Thrombocytopenia ([place])   . Essential hypertension   . Hyperlipidemia   . Scalp laceration   . Stage 2 chronic kidney disease   . Closed fracture of right hip requiring operative repair ([place]) 07/20/2020  .  Abnormal CT scan, colon   . Lower GI bleed 06/20/2020  . CAD (coronary artery disease) 04/27/2017  . 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 Chest pain on exertion 04/21/2017  . Abnormal CT of the abdomen   . Acute encephalopathy   . Hypokalemia   . Ileus ([place])   . Abdominal pain   . Acute lower GI bleeding 05/11/2016  . Gastrointestinal hemorrhage 05/11/2016  . Acute blood loss anemia   . Arterial hypotension   . Acute pain of left knee   . Slurred speech    PCP:   [name], MD Pharmacy:   [place], [place] - [address] [place] [address] Phone: [phone] Fax: [phone]     Social Determinants of Health (SDOH) Interventions    Readmission Risk Interventions No flowsheet data found.

## 2020-07-23 NOTE — Evaluation (Signed)
Occupational Therapy Evaluation Patient Details Name: Dustin Howell MRN: [id number] DOB: Nov 23, 1924 Today's Date: 07/23/2020    History of Present Illness Dustin Howell is an 87 y.o. male.  At baseline, patient lives by self and is ADL independent.  He had been in his usual state of health until today when he tripped over a rug in his home.  He lost his balance and fell backwards.  He fell on his right hip and also bumped his head.  Post fall, patient was unable to get up due to pain and loss of function.  He was able to call family members with his CELL phone.  Patient has a significant past medical history of chronic anemia, recurrent lower GI bleeding with findings of AVMs and diverticulosis on flex sigmoidoscopy on his last admission 1 month ago.   Clinical Impression   Dustin Howell was seen for OT evaluation this date, POD# 2 from R IM nail s/p fall in his home. Pt was independent in all ADLs prior to surgery, however his daughter reports he was generally using a "walking stick" for functional mobility while outside of his home. Pt endorses strong desire to get back to his yard work once the weather improves. Pt is eager to return to PLOF with less pain and improved safety and independence. Pt currently requires MAX assist for LB dressing and bathing, as well as MAX assist for squat pivot transfers from recliner to EOB (with +2 for safety) due to pain and limited AROM of R hip. Pt/caregiver (daughter at bedside) instructed in self care skills, falls prevention strategies, home/routines modifications, DME/AE for LB bathing and dressing tasks, and compression stocking mgt strategies. Pt would benefit from additional instruction in self care skills and techniques to help maintain precautions with or without assistive devices to support recall and carryover prior to discharge. Recommend STR upon hospital DC to maximize pt safety and return to functional independence.      Follow Up Recommendations  SNF     Equipment Recommendations  3 in 1 bedside commode    Recommendations for Other Services       Precautions / Restrictions Precautions Precautions: Fall Restrictions Weight Bearing Restrictions: Yes RLE Weight Bearing: Partial weight bearing      Mobility Bed Mobility Overal bed mobility: Needs Assistance Bed Mobility: Supine to Sit       Sit to supine: +2 for physical assistance;Mod assist;Max assist   General bed mobility comments: MOD/MAX assist +2 for sit to supine and for positioning in bed.    Transfers Overall transfer level: Needs assistance Equipment used: 2 person hand held assist Transfers: Squat Pivot Transfers     Squat pivot transfers: Max assist;+2 safety/equipment     General transfer comment: Max assist to transfer to his left to get back into bed.    Balance Overall balance assessment: Needs assistance Sitting-balance support: Feet supported Sitting balance-Leahy Scale: Fair Sitting balance - Comments: Steady static sitting, reaching within BOS. Fatigues quickly w/o back support with posterior lean noted 2/2 pain.   Standing balance support: Bilateral upper extremity supported;During functional activity Standing balance-Leahy Scale: Poor Standing balance comment: able to stand for pivot to bed with B UE support                           ADL either performed or assessed with clinical judgement   ADL Overall ADL's : Needs assistance/impaired Eating/Feeding: Sitting;Modified independent   Grooming: Supervision/safety;Set up;Sitting  Upper Body Bathing: Sitting;Set up;Supervision/ safety   Lower Body Bathing: Sitting/lateral leans;Moderate assistance   Upper Body Dressing : Sitting;Set up;Supervision/safety   Lower Body Dressing: Sit to/from stand;Maximal assistance;+2 for safety/equipment   Toilet Transfer: +2 for safety/equipment;+2 for physical assistance;Maximal assistance;BSC;Squat-pivot   Toileting- Clothing Manipulation  and Hygiene: Sit to/from stand;+2 for safety/equipment;Maximal assistance       Functional mobility during ADLs: Maximal assistance;+2 for safety/equipment       Vision Baseline Vision/History: Wears glasses Wears Glasses: At all times Patient Visual Report: No change from baseline       Perception     Praxis      Pertinent Vitals/Pain Pain Assessment: Faces Pain Score: 9  Pain Location: Pt endorses 9/10 pain in his RLE. Pain Descriptors / Indicators: Aching;Sore;Discomfort;Guarding;Grimacing;Moaning Pain Intervention(s): Limited activity within patient's tolerance;Monitored during session;Repositioned     Hand Dominance Right   Extremity/Trunk Assessment Upper Extremity Assessment Upper Extremity Assessment: LUE deficits/detail;RUE deficits/detail (Arthritic changes in B hands.) RUE Deficits / Details: WFLs with full AROM agains gravity. LUE Deficits / Details: Unable to achieve shoulder AROM above ~90. Able to engage in [place] to bring LUE upward for UB access. Pt endoses hx of old muscle injury which limites functional ROM in LUE. Denies pain. LUE Coordination: decreased gross motor;decreased fine motor   Lower Extremity Assessment RLE Deficits / Details: s/p R IM nail; significantly pain limited. RLE: Unable to fully assess due to pain RLE Coordination: decreased gross motor   Cervical / Trunk Assessment Cervical / Trunk Assessment: Kyphotic   Communication Communication Communication: HOH   Cognition Arousal/Alertness: Awake/alert Behavior During Therapy: WFL for tasks assessed/performed Overall Cognitive Status: Within Functional Limits for tasks assessed                                 General Comments: appears to have slight confusion   General Comments       Exercises Total Joint Exercises Ankle Circles/Pumps: AROM;10 reps;Both Quad Sets: AROM;Right;5 reps Heel Slides: AAROM;Right;10 reps Hip ABduction/ADduction:  AAROM;Right;10 reps Other Exercises Other Exercises: Pt/caregiver educated on role of OT in acute setting, safe use of AE/DME for ADL management, Falls prevention strategies for home and hospital, and routines modifications to support safety and functional indep. upon hospital DC.   Shoulder Instructions      Home Living Family/patient expects to be discharged to:: Private residence Living Arrangements: Alone Available Help at Discharge: Family;Available PRN/intermittently Type of Home: House Home Access: Stairs to enter [employer] of Steps: 2-3   Home Layout: One level     Bathroom Shower/Tub: Corporate investment banker: Standard     Home Equipment: Bedside commode   Additional Comments: Pt uses "walking sticks" for outside/community mobility      Prior Functioning/Environment Level of Independence: Independent;Independent with assistive device(s)        Comments: Per daughter, pt takes bird baths at baseline, walks with walking stick outside of the home. Otherwise independent for ADL/IADL management. He does yardwork in the warmer months, manages his own laundry, etc.        OT Problem List: Decreased strength;Decreased coordination;Decreased activity tolerance;Decreased safety awareness;Impaired balance (sitting and/or standing);Decreased knowledge of use of DME or AE;Decreased range of motion;Pain      OT Treatment/Interventions: Self-care/ADL training;Therapeutic exercise;Therapeutic activities;DME and/or AE instruction;Patient/family education;Balance training    OT Goals(Current goals can be found in the care plan section) Acute Rehab  OT Goals Patient Stated Goal: "To get stronger so I can get back to my yardwork." OT Goal Formulation: With patient/family Time For Goal Achievement: 08/06/20 Potential to Achieve Goals: Good ADL Goals Pt Will Perform Grooming: standing;with adaptive equipment (C LRAD PRN for improved safety and  functional indep.) Pt Will Perform Lower Body Dressing: sit to/from stand;with adaptive equipment;with supervision;with set-up (C LRAD PRN for improved safety and functional indep.) Pt Will Transfer to Toilet: bedside commode;with supervision;with set-up (C LRAD PRN for improved safety and functional indep.)  OT Frequency: Min 1X/week   Barriers to D/C: Decreased caregiver support;Inaccessible home environment          Co-evaluation PT/OT/SLP Co-Evaluation/Treatment: Yes Reason for Co-Treatment: For patient/therapist safety;To address functional/ADL transfers PT goals addressed during session: Mobility/safety with mobility;Balance;Proper use of DME;Strengthening/ROM OT goals addressed during session: ADL's and self-care;Proper use of Adaptive equipment and DME      AM-PAC OT "6 Clicks" Daily Activity     Outcome Measure Help from another person eating meals?: A Little Help from another person taking care of personal grooming?: A Little Help from another person toileting, which includes using toliet, bedpan, or urinal?: A Lot Help from another person bathing (including washing, rinsing, drying)?: A Lot Help from another person to put on and taking off regular upper body clothing?: A Little Help from another person to put on and taking off regular lower body clothing?: A Lot 6 Click Score: 15   End of Session Equipment Utilized During Treatment: Gait belt Nurse Communication: Mobility status  Activity Tolerance: Patient tolerated treatment well Patient left: in bed;with call bell/phone within reach;with bed alarm set;with family/visitor present  OT Visit Diagnosis: Other abnormalities of gait and mobility (R26.89);Muscle weakness (generalized) (M62.81);Pain Pain - Right/Left: Right Pain - part of body: Hip;Knee                Time: [id number] OT Time Calculation (min): 38 min Charges:  OT General Charges $OT Visit: 1 Visit OT Evaluation $OT Eval Moderate Complexity: 1 Mod OT  Treatments $Self Care/Home Management : 8-22 mins  Shara Blazing, M.S., OTR/L Ascom: [phone] 07/23/20, 2:51 PM

## 2020-07-23 NOTE — Progress Notes (Signed)
Subjective: 2 Days Post-Op Procedure(s) (LRB): INTRAMEDULLARY (IM) NAIL INTERTROCHANTRIC (Right)   Patient is bright and alert today.  Sitting up in a chair eating lunch.  Dressing is dry.  His daughter is present again today.  Social worker just saw her and is initiating skilled nursing placement.  Patient reports pain as mild.  Objective:   VITALS:   Vitals:   07/23/20 0733 07/23/20 1113  BP: (!) 135/57 (!) 152/65  Pulse: 84 96  Resp: 16 18  Temp: 98 F (36.7 C) 99.1 F (37.3 C)  SpO2: 98% 98%    Neurologically intact Sensation intact distally Dorsiflexion/Plantar flexion intact Incision: dressing C/D/I  LABS Recent Labs    07/21/20 1051 07/22/20 0554 07/22/20 1734 07/23/20 0616  HGB 8.5* 6.4* 8.2* 7.7*  HCT 26.7* 19.7* 24.5* 22.8*  WBC 18.1* 18.4*  --  14.5*  PLT 87* 80*  --  71*    Recent Labs    07/21/20 0534 07/23/20 0616  NA 141 136  K 4.9 3.9  BUN 30* 25*  CREATININE 1.17 1.06  GLUCOSE 137* 99    No results for input(s): LABPT, INR in the last 72 hours.   Assessment/Plan: 2 Days Post-Op Procedure(s) (LRB): INTRAMEDULLARY (IM) NAIL INTERTROCHANTRIC (Right)   Advance diet Up with therapy Discharge to SNF   Patient is to be partial weightbearing right leg with a walker  Enteric-coated aspirin twice daily for 6 weeks postop  Return to office in 2 weeks for x-ray and staple removal

## 2020-07-23 NOTE — NC FL2 (Signed)
[place] LEVEL OF CARE SCREENING TOOL     IDENTIFICATION  Patient Name: Dustin Howell Birthdate: [birth year] Sex: male Admission Date (Current Location): 07/20/2020  [place] and [place] Number:  Engineering geologist and Address:  [facility], [address], [place], [place] [address]      Provider Number: [id number]  Attending Physician Name and Address:  Loletha Grayer, MD  Relative Name and Phone Number:  [name] [phone]    Current Level of Care: Hospital Recommended Level of Care: [place] Prior Approval Number:    Date Approved/Denied:   PASRR Number: [id number]  Discharge Plan: SNF    Current Diagnoses: Patient Active Problem List   Diagnosis Date Noted  . Protein-calorie malnutrition, severe 07/22/2020  . Acute metabolic encephalopathy   . Iron deficiency anemia due to chronic blood loss   . Lymphoproliferative disorder ([place])   . Thrombocytopenia ([place])   . Essential hypertension   . Hyperlipidemia   . Scalp laceration   . Stage 2 chronic kidney disease   . Closed fracture of right hip requiring operative repair ([place]) 07/20/2020  . Abnormal CT scan, colon   . Lower GI bleed 06/20/2020  . CAD (coronary artery disease) 04/27/2017  . 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 Chest pain on exertion 04/21/2017  . Abnormal CT of the abdomen   . Acute encephalopathy   . Hypokalemia   . Ileus ([place])   . Abdominal pain   . Acute lower GI bleeding 05/11/2016  . Gastrointestinal hemorrhage 05/11/2016  . Acute blood loss anemia   . Arterial hypotension   . Acute pain of left knee   . Slurred speech     Orientation RESPIRATION BLADDER Height & Weight     Self,Situation,Place,Time  Normal Continent Weight: 47.6 kg Height:  5\' 7"  (170.2 cm)  BEHAVIORAL SYMPTOMS/MOOD NEUROLOGICAL BOWEL NUTRITION STATUS        Diet (Dyshphagia 3)  AMBULATORY STATUS COMMUNICATION OF NEEDS Skin   Extensive  Assist Verbally                         Personal Care Assistance Level of Assistance  Bathing,Feeding,Dressing Bathing Assistance: Maximum assistance Feeding assistance: Limited assistance Dressing Assistance: Maximum assistance     Functional Limitations Info  Sight,Hearing,Speech          SPECIAL CARE FACTORS FREQUENCY  PT (By licensed PT),OT (By licensed OT)                    Contractures Contractures Info: Not present    Additional Factors Info  Code Status,Allergies Code Status Info: Full Allergies Info: No known allergies           Current Medications (07/23/2020):  This is the current hospital active medication list Current Facility-Administered Medications  Medication Dose Route Frequency Provider Last Rate Last Admin  . acetaminophen (TYLENOL) tablet 325-650 mg  325-650 mg Oral Q6H PRN Earnestine Leys, MD      . alum & mag hydroxide-simeth (MAALOX/MYLANTA) 200-200-20 MG/5ML suspension 30 mL  30 mL Oral Q4H PRN Earnestine Leys, MD      . amLODipine (NORVASC) tablet 5 mg  5 mg Oral Daily Earnestine Leys, MD   5 mg at 07/23/20 3903  . atorvastatin (LIPITOR) tablet 10 mg  10 mg Oral Daily Earnestine Leys, MD   10 mg at 07/23/20 0851  . bisacodyl (DULCOLAX) EC tablet 5 mg  5  mg Oral Daily PRN Earnestine Leys, MD      . bisacodyl (DULCOLAX) suppository 10 mg  10 mg Rectal Daily PRN Earnestine Leys, MD      . cephALEXin [facility]) capsule 250 mg  250 mg Oral Q8H Loletha Grayer, MD   250 mg at 07/23/20 0850  . enoxaparin (LOVENOX) injection 30 mg  30 mg Subcutaneous Q24H Earnestine Leys, MD   30 mg at 07/23/20 0850  . feeding supplement (ENSURE ENLIVE / ENSURE PLUS) liquid 237 mL  237 mL Oral BID BM Loletha Grayer, MD   237 mL at 07/23/20 0906  . ferrous sulfate tablet 325 mg  325 mg Oral BID WC Loletha Grayer, MD   325 mg at 07/23/20 0849  . HYDROcodone-acetaminophen (NORCO/VICODIN) 5-325 MG per tablet 1 tablet  1 tablet Oral Q4H PRN Earnestine Leys, MD   1  tablet at 07/22/20 2145  . magnesium hydroxide (MILK OF MAGNESIA) suspension 30 mL  30 mL Oral Daily PRN Earnestine Leys, MD      . menthol-cetylpyridinium (CEPACOL) lozenge 3 mg  1 lozenge Oral PRN Earnestine Leys, MD       Or  . phenol (CHLORASEPTIC) mouth spray 1 spray  1 spray Mouth/Throat PRN Earnestine Leys, MD      . metoCLOPramide (REGLAN) tablet 5-10 mg  5-10 mg Oral Q8H PRN Earnestine Leys, MD       Or  . metoCLOPramide (REGLAN) injection 5-10 mg  5-10 mg Intravenous Q8H PRN Earnestine Leys, MD      . morphine 2 MG/ML injection 1 mg  1 mg Intravenous Q2H PRN Earnestine Leys, MD      . multivitamin with minerals tablet 1 tablet  1 tablet Oral Daily Loletha Grayer, MD   1 tablet at 07/23/20 [phone]  . mupirocin ointment (BACTROBAN) 2 % 1 application  1 application Nasal BID Earnestine Leys, MD   1 application at 99/35/70 0854  . nitroGLYCERIN (NITROSTAT) SL tablet 0.4 mg  0.4 mg Sublingual Q5 min PRN Earnestine Leys, MD      . ondansetron [facility]) tablet 4 mg  4 mg Oral Q6H PRN Earnestine Leys, MD       Or  . ondansetron [facility]) injection 4 mg  4 mg Intravenous Q6H PRN Earnestine Leys, MD      . senna [facility]) tablet 8.6 mg  1 tablet Oral BID Earnestine Leys, MD   8.6 mg at 07/23/20 0851  . sodium phosphate (FLEET) 7-19 GM/118ML enema 1 enema  1 enema Rectal Once PRN Earnestine Leys, MD      . traZODone (DESYREL) tablet 50 mg  50 mg Oral QHS PRN Loletha Grayer, MD         Discharge Medications: Please see discharge summary for a list of discharge medications.  Relevant Imaging Results:  Relevant Lab Results:   Additional Information SS# [id number]  Shelbie Ammons, RN

## 2020-07-24 DIAGNOSIS — S72001S Fracture of unspecified part of neck of right femur, sequela: Secondary | ICD-10-CM | POA: Diagnosis not present

## 2020-07-24 DIAGNOSIS — G9341 Metabolic encephalopathy: Secondary | ICD-10-CM | POA: Diagnosis not present

## 2020-07-24 DIAGNOSIS — D500 Iron deficiency anemia secondary to blood loss (chronic): Secondary | ICD-10-CM | POA: Diagnosis not present

## 2020-07-24 LAB — RESP PANEL BY RT-PCR (FLU A&B, COVID) ARPGX2
Influenza A by PCR: NEGATIVE
Influenza B by PCR: NEGATIVE
SARS Coronavirus 2 by RT PCR: NEGATIVE

## 2020-07-24 MED ORDER — DICLOFENAC SODIUM 1 % EX GEL
2.0000 g | Freq: Four times a day (QID) | CUTANEOUS | Status: DC
  Administered 2020-07-24 – 2020-07-26 (×6): 2 g via TOPICAL
  Filled 2020-07-24: qty 100

## 2020-07-24 NOTE — Progress Notes (Signed)
Subjective: 3 Days Post-Op Procedure(s) (LRB): INTRAMEDULLARY (IM) NAIL INTERTROCHANTRIC (Right)   Patient is alert and sitting up watching TV.  He is not in any pain.  His dressing is dry.  Hemoglobin remained stable.  Awaiting skilled nursing placement  Patient reports pain as mild.  Objective:   VITALS:   Vitals:   07/24/20 1128 07/24/20 1528  BP: (!) 123/52 (!) 134/59  Pulse: 83 96  Resp: 15 16  Temp: 98.6 F (37 C) 98.8 F (37.1 C)  SpO2: 100% 97%    Neurologically intact Sensation intact distally Dorsiflexion/Plantar flexion intact Incision: dressing C/D/I  LABS Recent Labs    07/22/20 0554 07/22/20 1734 07/23/20 0616  HGB 6.4* 8.2* 7.7*  HCT 19.7* 24.5* 22.8*  WBC 18.4*  --  14.5*  PLT 80*  --  71*    Recent Labs    07/23/20 0616  NA 136  K 3.9  BUN 25*  CREATININE 1.06  GLUCOSE 99    No results for input(s): LABPT, INR in the last 72 hours.   Assessment/Plan: 3 Days Post-Op Procedure(s) (LRB): INTRAMEDULLARY (IM) NAIL INTERTROCHANTRIC (Right)   Advance diet Up with therapy Discharge to SNF   Partial weightbearing right leg with walker  Discharged on enteric-coated aspirin twice daily for 6 weeks follow-up  Follow-up in office in 2 weeks for x-rays and staple removal

## 2020-07-24 NOTE — TOC Progression Note (Signed)
Transition of Care [facility]) - Progression Note    Patient Details  Name: Dustin Howell MRN: [id number] Date of Birth: 12-Apr-1925  Transition of Care [facility]) CM/SW Contact  Shelbie Ammons, RN Phone Number: 07/24/2020, 10:24 AM  Clinical Narrative:   RNCM reached out to patient's daughter to discuss bed offers of [facility] and [facility]. Daughter reported that she would discuss with her family and then return call. She returned call within a few minutes and reported they decided to choose [facility]. RNCM accepted bed in the hub and notified facility.     Expected Discharge Plan: [place] Barriers to Discharge: No Barriers Identified  Expected Discharge Plan and Services Expected Discharge Plan: [place] arrangements for the past 2 months: Single Family Home                                       Social Determinants of Health (SDOH) Interventions    Readmission Risk Interventions No flowsheet data found.

## 2020-07-24 NOTE — Care Management Important Message (Signed)
Important Message  Patient Details  Name: Dustin Howell MRN: [id number] Date of Birth: [birth year]   Medicare Important Message Given:  Yes     [name] A Brittany Osier 07/24/2020, 10:46 AM

## 2020-07-24 NOTE — Progress Notes (Signed)
Occupational Therapy Treatment Patient Details Name: Dustin Howell MRN: [id number] DOB: 29-Nov-1924 Today's Date: 07/24/2020    History of present illness Dustin Howell is an 87 y.o. male.  At baseline, patient lives by self and is ADL independent.  He had been in his usual state of health until today when he tripped over a rug in his home.  He lost his balance and fell backwards.  He fell on his right hip and also bumped his head.  Post fall, patient was unable to get up due to pain and loss of function.  He was able to call family members with his CELL phone.  Patient has a significant past medical history of chronic anemia, recurrent lower GI bleeding with findings of AVMs and diverticulosis on flex sigmoidoscopy on his last admission 1 month ago.   OT comments  Pt seen for OT/PT co-treatment on this date, units for session split accordingly per protocols. Upon arrival to room pt awake/alert semi-supine in bed with PT in room to begin session. Pt endorses moderate pain in his RLE, but agreeable to therapy session this date. Pt functional mobility progressing well. He requires initial +2 for assist to come to sitting at EOB, once seated upright pt is able to advance hips forward and perfoms STS with +2 MIN A/MIN Guard. During session he is able to advance functional mobility to +1 assist with MIN A to maintain balance during ambulation in room. Pt and provider discuss safe use of DME for toilet transfer to [facility] in consideration of PWB status. Pt return verbalizes understanding. Coordinated with nsg to assist pt to [facility] once pt ready to have BM. Pt left in room recliner with chair alarm set, all needs met, call bell in reach. Pt making good progress toward goals and continues to benefit from skilled OT services to maximize return to PLOF and minimize risk of future falls, injury, caregiver burden, and readmission. Will continue to follow POC. Discharge recommendation remains appropriate.    Follow Up  Recommendations  SNF    Equipment Recommendations  3 in 1 bedside commode    Recommendations for Other Services      Precautions / Restrictions Precautions Precautions: Fall Restrictions Weight Bearing Restrictions: Yes RLE Weight Bearing: Partial weight bearing       Mobility Bed Mobility Overal bed mobility: Needs Assistance Bed Mobility: Supine to Sit     Supine to sit: Mod assist;+2 for physical assistance     General bed mobility comments: min assist to bring RLE off bed, mod +2 assist to raise trunk to seated position and min assist to maintain sitting balance at times with posterior leaning. Patient with improved ability to scoot hips forward at edge of bed.  Transfers Overall transfer level: Needs assistance Equipment used: Rolling walker (2 wheeled) Transfers: Sit to/from Stand Sit to Stand: Min assist;From elevated surface         General transfer comment: Cues for hand placement and PWB on R LE    Balance Overall balance assessment: Needs assistance Sitting-balance support: Feet supported;Single extremity supported Sitting balance-Leahy Scale: Poor Sitting balance - Comments: losing balance posteriorly with sitting; requires multimodal cueing to maintain upright position. Postural control: Posterior lean Standing balance support: Bilateral upper extremity supported;During functional activity Standing balance-Leahy Scale: Poor Standing balance comment: able stand with min assist. Cues for WB and hand placement on rolling walker. One lob with ambulation. Requiring assistance to recover.  ADL either performed or assessed with clinical judgement   ADL Overall ADL's : Needs assistance/impaired                         Toilet Transfer: BSC;Ambulation;RW;Minimal assistance           Functional mobility during ADLs: Minimal assistance;Rolling walker;Cueing for safety General ADL Comments: Pt functional  mobility progressing well. He requires initial +2 for assist to come to sitting at EOB, once seated upright pt is able to advance hips forward and perfoms STS with +2 MIN A/MIN Guard. During session he is able to advance functional mobility to +1 assist with MIN A to maintain balance during ambulation in room. Pt and provider discuss safe use of DME for toilet transfer to [facility] in consideration of PWB status. Pt return verbalizes understanding. Coordinated with nsg to assist pt to [facility] once pt ready to have BM.     Vision Baseline Vision/History: Wears glasses Wears Glasses: At all times Patient Visual Report: No change from baseline     Perception     Praxis      Cognition Arousal/Alertness: Awake/alert Behavior During Therapy: WFL for tasks assessed/performed Overall Cognitive Status: Within Functional Limits for tasks assessed                                 General Comments: Disoriented to time, but generally able to follow VCs consistently.        Exercises Total Joint Exercises Ankle Circles/Pumps: AROM;5 reps;Both Long Arc Quad: AROM;Right;10 reps Other Exercises Other Exercises: OT/PT facilitate functional mobility including sit>sup, STS from EOB, functional transfer in room and [facility] training with cues to maintain PWB status and safety education provided t/o session.   Shoulder Instructions       General Comments      Pertinent Vitals/ Pain       Pain Assessment: Faces Faces Pain Scale: Hurts even more Pain Location: R LE with movement Pain Descriptors / Indicators: Aching;Sore;Discomfort;Grimacing;Guarding Pain Intervention(s): Limited activity within patient's tolerance;Monitored during session;Repositioned  Home Living                                          Prior Functioning/Environment              Frequency  Min 1X/week        Progress Toward Goals  OT Goals(current goals can now be found in the care plan  section)  Progress towards OT goals: Progressing toward goals  Acute Rehab OT Goals Patient Stated Goal: "To get stronger so I can get back to my yardwork." OT Goal Formulation: With patient/family Time For Goal Achievement: 08/06/20 Potential to Achieve Goals: Good  Plan Discharge plan remains appropriate;Frequency remains appropriate    Co-evaluation    PT/OT/SLP Co-Evaluation/Treatment: Yes Reason for Co-Treatment: For patient/therapist safety;To address functional/ADL transfers PT goals addressed during session: Mobility/safety with mobility;Balance;Proper use of DME;Strengthening/ROM OT goals addressed during session: ADL's and self-care;Proper use of Adaptive equipment and DME      AM-PAC OT "6 Clicks" Daily Activity     Outcome Measure   Help from another person eating meals?: None Help from another person taking care of personal grooming?: A Little Help from another person toileting, which includes using toliet, bedpan, or urinal?: A Little Help from  another person bathing (including washing, rinsing, drying)?: A Lot Help from another person to put on and taking off regular upper body clothing?: A Little Help from another person to put on and taking off regular lower body clothing?: A Lot 6 Click Score: 17    End of Session Equipment Utilized During Treatment: Gait belt  OT Visit Diagnosis: Other abnormalities of gait and mobility (R26.89);Muscle weakness (generalized) (M62.81);Pain Pain - Right/Left: Right Pain - part of body: Hip;Knee   Activity Tolerance Patient tolerated treatment well   Patient Left in bed;with call bell/phone within reach;with bed alarm set;with family/visitor present   Nurse Communication Mobility status (Pt safe to get up to [facility] with +1 assist. BSC placed and ready for pt use.)        Time: 1740-8144 OT Time Calculation (min): 31 min  Charges: OT General Charges $OT Visit: 1 Visit OT Treatments $Self Care/Home Management : 8-22  mins  Shara Blazing, M.S., OTR/L Ascom: [phone] 07/24/20, 10:59 AM

## 2020-07-24 NOTE — Progress Notes (Signed)
Physical Therapy Treatment Patient Details Name: Dustin Howell MRN: [id number] DOB: 1924-10-07 Today's Date: 07/24/2020    History of Present Illness Dustin Howell is an 87 y.o. male.  At baseline, patient lives by self and is ADL independent.  He had been in his usual state of health until today when he tripped over a rug in his home.  He lost his balance and fell backwards.  He fell on his right hip and also bumped his head.  Post fall, patient was unable to get up due to pain and loss of function.  He was able to call family members with his CELL phone.  Patient has a significant past medical history of chronic anemia, recurrent lower GI bleeding with findings of AVMs and diverticulosis on flex sigmoidoscopy on his last admission 1 month ago.    PT Comments    Patient received in bed, reports he slept well. Patient continues to be confused about time. Able to follow directions well. Required min/mod assist for bed mobility. Min assist for sit to stand. Cues for hand placement and PWB status. Cues for sequencing with RW. Patient ambulated 20 feet with min assist. Patient will continue to benefit from skilled PT while here to improve functional independence, strength and safety with mobility.      Follow Up Recommendations  SNF;Supervision/Assistance - 24 hour     Equipment Recommendations  Rolling walker with 5" wheels    Recommendations for Other Services       Precautions / Restrictions Precautions Precautions: Fall Restrictions Weight Bearing Restrictions: Yes RLE Weight Bearing: Partial weight bearing    Mobility  Bed Mobility Overal bed mobility: Needs Assistance Bed Mobility: Supine to Sit     Supine to sit: Mod assist     General bed mobility comments: min assist to bring RLE off bed, mod assist to raise trunk to seated position and min assist to maintain sitting balance at times with posterior leaning. Patient with improved ability to scoot hips forward at edge of  bed.  Transfers Overall transfer level: Needs assistance Equipment used: Rolling walker (2 wheeled) Transfers: Sit to/from Stand Sit to Stand: Min assist;From elevated surface         General transfer comment: Cues for hand placement and PWB on R LE  Ambulation/Gait Ambulation/Gait assistance: Min assist Gait Distance (Feet): 20 Feet Assistive device: Rolling walker (2 wheeled) Gait Pattern/deviations: Step-to pattern;Decreased weight shift to left;Decreased step length - right;Decreased step length - left;Shuffle Gait velocity: decr   General Gait Details: patient ambulated to door with RW and min assist. One lob requiring assist to recover.   Stairs             Wheelchair Mobility    Modified Rankin (Stroke Patients Only)       Balance Overall balance assessment: Needs assistance Sitting-balance support: Feet supported Sitting balance-Leahy Scale: Poor Sitting balance - Comments: losing balance posteriorly with sitting   Standing balance support: Bilateral upper extremity supported;During functional activity Standing balance-Leahy Scale: Poor Standing balance comment: able stand with min assist. Cues for WB and hand placement on rolling walker. One lob with ambulation. Requiring assistance to recover.                            Cognition Arousal/Alertness: Awake/alert Behavior During Therapy: WFL for tasks assessed/performed Overall Cognitive Status: Within Functional Limits for tasks assessed  General Comments: confused to time this am      Exercises Total Joint Exercises Ankle Circles/Pumps: AROM;5 reps;Both Long Arc Quad: AROM;Right;10 reps    General Comments        Pertinent Vitals/Pain Pain Assessment: Faces Faces Pain Scale: Hurts even more Pain Location: R LE with movement Pain Descriptors / Indicators: Aching;Sore;Discomfort;Grimacing;Guarding Pain Intervention(s): Monitored  during session;Limited activity within patient's tolerance;Repositioned    Home Living                      Prior Function            PT Goals (current goals can now be found in the care plan section) Acute Rehab PT Goals Patient Stated Goal: "To get stronger so I can get back to my yardwork." PT Goal Formulation: With patient Time For Goal Achievement: 08/06/20 Potential to Achieve Goals: Good Progress towards PT goals: Progressing toward goals    Frequency    BID      PT Plan Current plan remains appropriate    Co-evaluation              AM-PAC PT "6 Clicks" Mobility   Outcome Measure  Help needed turning from your back to your side while in a flat bed without using bedrails?: A Lot Help needed moving from lying on your back to sitting on the side of a flat bed without using bedrails?: A Lot Help needed moving to and from a bed to a chair (including a wheelchair)?: A Little Help needed standing up from a chair using your arms (e.g., wheelchair or bedside chair)?: A Little Help needed to walk in hospital room?: A Little Help needed climbing 3-5 steps with a railing? : A Lot 6 Click Score: 15    End of Session Equipment Utilized During Treatment: Gait belt Activity Tolerance: Patient limited by pain Patient left: in chair;with call bell/phone within reach;with chair alarm set Nurse Communication: Mobility status PT Visit Diagnosis: Unsteadiness on feet (R26.81);Muscle weakness (generalized) (M62.81);Difficulty in walking, not elsewhere classified (R26.2);Other abnormalities of gait and mobility (R26.89);History of falling (Z91.81);Pain Pain - Right/Left: Left Pain - part of body: Hip;Leg     Time: 0947-0962 PT Time Calculation (min) (ACUTE ONLY): 19 min  Charges:  $Gait Training: 8-22 mins                     [employer], PT, GCS 07/24/20,10:05 AM

## 2020-07-24 NOTE — Progress Notes (Signed)
PROGRESS NOTE    Dustin Howell  BJY:[id number] DOB: 1925-03-04 DOA: 07/20/2020 PCP: [name], MD   Chief complaint: leg pain. Brief Narrative:  Patient admitted on 07/20/2020 with fall and found to have a right hip fracture.  Patient had ORIF by Dr. Sabra Heck on 07/21/2020.  Patient had postoperative anemia with hemoglobin dropping down to 6.4 and was given 1 unit of irradiated blood.  Hemoglobin increased to 7.7.   Assessment & Plan:   Active Problems:   Closed fracture of right hip requiring operative repair [facility])   Iron deficiency anemia due to chronic blood loss   Lymphoproliferative disorder (HCC)   Thrombocytopenia (HCC)   Essential hypertension   Hyperlipidemia   Scalp laceration   Stage 2 chronic kidney disease   Acute metabolic encephalopathy   Protein-calorie malnutrition, severe  #1.  Acute blood loss anemia secondary to surgery. Status post 1 unit PRBC, condition stable.  #2.  Right hip fracture status post surgery. Patient has been able to walk with physical therapy. Currently doing well.  3.  Acute metabolic encephalopathy. Condition has improved.  Currently patient does not have significant confusion.  4.  Scalp laceration secondary to trauma. No active bleed or infection.  5.  Severe protein calorie malnutrition. Continue supplement.    DVT prophylaxis: Lovenox Code Status: Full Family Communication: Daughter at the bedside. Disposition Plan:  .   Status is: Inpatient  Remains inpatient appropriate because:Unsafe d/c plan   Dispo: The patient is from: Home              Anticipated d/c is to: SNF              Anticipated d/c date is: 1 day              Patient currently is medically stable to d/c.        I/O last 3 completed shifts: In: 480 [P.O.:480] Out: 650 [Urine:650] Total I/O In: 600 [P.O.:600] Out: -      Consultants:   Orthopedics  Procedures: Hip surgery  Antimicrobials:  Cephalexin   Subjective: Patient is  doing well, he was able to walk with physical therapy. He has some pain in the left leg which is chronic in nature He does not have a significant confusion. Denies any short of breath or cough. No abdominal pain or nausea vomiting.  Objective: Vitals:   07/24/20 0608 07/24/20 0806 07/24/20 0947 07/24/20 1128  BP: 117/61 (!) 100/48  (!) 123/52  Pulse:  74  83  Resp:  16  15  Temp:  98.4 F (36.9 C)  98.6 F (37 C)  TempSrc:      SpO2:  97% 97% 100%  Weight:      Height:        Intake/Output Summary (Last 24 hours) at 07/24/2020 1435 Last data filed at 07/24/2020 1023 Gross per 24 hour  Intake 600 ml  Output 400 ml  Net 200 ml   Filed Weights   07/20/20 1227  Weight: 47.6 kg    Examination:  General exam: Appears calm and comfortable, appear malnourished Respiratory system: Clear to auscultation. Respiratory effort normal. Cardiovascular system: S1 & S2 heard, RRR. No JVD, murmurs, rubs, gallops or clicks. No pedal edema. Gastrointestinal system: Abdomen is nondistended, soft and nontender. No organomegaly or masses felt. Normal bowel sounds heard. Central nervous system: Alert and oriented x2, No focal neurological deficits. Extremities: Symmetric 5 x 5 power. Skin: No rashes, lesions or ulcers Psychiatry: . Mood &  affect appropriate.     Data Reviewed: I have personally reviewed following labs and imaging studies  CBC: Recent Labs  Lab 07/20/20 1306 07/21/20 0534 07/21/20 1051 07/22/20 0554 07/22/20 1734 07/23/20 0616  WBC 9.0 14.5* 18.1* 18.4*  --  14.5*  NEUTROABS 4.6  --   --  8.6*  --   --   HGB 10.3* 7.8* 8.5* 6.4* 8.2* 7.7*  HCT 32.3* 24.3* 26.7* 19.7* 24.5* 22.8*  MCV 95.8 95.3 95.7 93.4  --  89.4  PLT 127* 101* 87* 80*  --  71*   Basic Metabolic Panel: Recent Labs  Lab 07/20/20 1306 07/21/20 0534 07/23/20 0616  NA 137 141 136  K 3.9 4.9 3.9  CL 102 108 101  CO2 25 26 24   GLUCOSE 121* 137* 99  BUN 22 30* 25*  CREATININE 0.95 1.17 1.06   CALCIUM 9.1 8.7* 8.2*   GFR: Estimated Creatinine Clearance: 28.7 mL/min (by C-G formula based on SCr of 1.06 mg/dL). Liver Function Tests: No results for input(s): AST, ALT, ALKPHOS, BILITOT, PROT, ALBUMIN in the last 168 hours. No results for input(s): LIPASE, AMYLASE in the last 168 hours. No results for input(s): AMMONIA in the last 168 hours. Coagulation Profile: Recent Labs  Lab 07/20/20 1309  INR 1.0   Cardiac Enzymes: No results for input(s): CKTOTAL, CKMB, CKMBINDEX, TROPONINI in the last 168 hours. BNP (last 3 results) No results for input(s): PROBNP in the last 8760 hours. HbA1C: No results for input(s): HGBA1C in the last 72 hours. CBG: Recent Labs  Lab 07/23/20 0738  GLUCAP 98   Lipid Profile: No results for input(s): CHOL, HDL, LDLCALC, TRIG, CHOLHDL, LDLDIRECT in the last 72 hours. Thyroid Function Tests: No results for input(s): TSH, T4TOTAL, FREET4, T3FREE, THYROIDAB in the last 72 hours. Anemia Panel: No results for input(s): VITAMINB12, FOLATE, FERRITIN, TIBC, IRON, RETICCTPCT in the last 72 hours. Sepsis Labs: No results for input(s): PROCALCITON, LATICACIDVEN in the last 168 hours.  Recent Results (from the past 240 hour(s))  SARS CORONAVIRUS 2 (TAT 6-24 HRS) Nasopharyngeal Nasopharyngeal Swab     Status: None   Collection Time: 07/20/20 12:45 PM   Specimen: Nasopharyngeal Swab  Result Value Ref Range Status   SARS Coronavirus 2 NEGATIVE NEGATIVE Final    Comment: (NOTE) SARS-CoV-2 target nucleic acids are NOT DETECTED.  The SARS-CoV-2 RNA is generally detectable in upper and lower respiratory specimens during the acute phase of infection. Negative results do not preclude SARS-CoV-2 infection, do not rule out co-infections with other pathogens, and should not be used as the sole basis for treatment or other patient management decisions. Negative results must be combined with clinical observations, patient history, and epidemiological  information. The expected result is Negative.  Fact Sheet for Patients: SugarRoll.be  Fact Sheet for Healthcare Providers: https://www.woods-mathews.com/  This test is not yet approved or cleared by the [place] FDA and  has been authorized for detection and/or diagnosis of SARS-CoV-2 by FDA under an Emergency Use Authorization (EUA). This EUA will remain  in effect (meaning this test can be used) for the duration of the COVID-19 declaration under Se ction 564(b)(1) of the Act, 21 U.S.C. section 360bbb-3(b)(1), unless the authorization is terminated or revoked sooner.  Performed at [facility], [place] [address] [place],  [address]   Surgical PCR screen     Status: Abnormal   Collection Time: 07/21/20 12:06 AM   Specimen: Nasal Mucosa; Nasal Swab  Result Value Ref Range Status  MRSA, PCR NEGATIVE NEGATIVE Final   Staphylococcus aureus POSITIVE (A) NEGATIVE Final    Comment: (NOTE) The Xpert SA Assay (FDA approved for NASAL specimens in patients 17 years of age and older), is one component of a comprehensive surveillance program. It is not intended to diagnose infection nor to guide or monitor treatment. Performed at [facility], [address], [place], [place] [address]          Radiology Studies: No results found.      Scheduled Meds: . amLODipine  5 mg Oral Daily  . atorvastatin  10 mg Oral Daily  . cephALEXin  250 mg Oral Q8H  . enoxaparin (LOVENOX) injection  30 mg Subcutaneous Q24H  . feeding supplement  237 mL Oral BID BM  . ferrous sulfate  325 mg Oral BID WC  . multivitamin with minerals  1 tablet Oral Daily  . mupirocin ointment  1 application Nasal BID  . senna  1 tablet Oral BID   Continuous Infusions:   LOS: 4 days    Time spent: 27 minutes    Sharen Hones, MD [facility]   To contact the attending provider between 7A-7P or the covering provider during after  hours 7P-7A, please log into the web site [email or web address] and access using universal [place] password for that web site. If you do not have the password, please call the hospital operator.  07/24/2020, 2:35 PM

## 2020-07-24 NOTE — Progress Notes (Signed)
Physical Therapy Treatment Patient Details Name: ENDRE COUTTS MRN: [id number] DOB: 26-Feb-1925 Today's Date: 07/24/2020    History of Present Illness Ladarrian P Overholser is an 87 y.o. male.  At baseline, patient lives by self and is ADL independent.  He had been in his usual state of health until today when he tripped over a rug in his home.  He lost his balance and fell backwards.  He fell on his right hip and also bumped his head.  Post fall, patient was unable to get up due to pain and loss of function.  He was able to call family members with his CELL phone.  Patient has a significant past medical history of chronic anemia, recurrent lower GI bleeding with findings of AVMs and diverticulosis on flex sigmoidoscopy on his last admission 1 month ago.    PT Comments    Patient back in bed this afternoon. Requiring increased motivation and encouragement to participate in PT. Daughter present in room, supportive and encouraging. Patient requires max assist with bed mobility due to decreased motivation. Patient able to sit to stand with min assist and ambulated with min guard and rw. He is self limiting and declining further ambulation distance. Patient will continue to benefit from skilled PT while here to improve strength and functional independence.           Follow Up Recommendations  SNF;Supervision/Assistance - 24 hour     Equipment Recommendations  Rolling walker with 5" wheels    Recommendations for Other Services       Precautions / Restrictions Precautions Precautions: Fall Restrictions Weight Bearing Restrictions: Yes RLE Weight Bearing: Partial weight bearing    Mobility  Bed Mobility Overal bed mobility: Needs Assistance Bed Mobility: Supine to Sit;Sit to Supine     Supine to sit: Max assist Sit to supine: Max assist   General bed mobility comments: Max assist for bed mobility this pm. Patient stating "if i'm going to do it, youre going to work." Just a touch cranky this pm.  Daughter present.  Transfers Overall transfer level: Needs assistance Equipment used: Rolling walker (2 wheeled) Transfers: Sit to/from Stand Sit to Stand: From elevated surface;Min assist         General transfer comment: Cues for hand placement and PWB on R LE  Ambulation/Gait Ambulation/Gait assistance: Min assist Gait Distance (Feet): 10 Feet Assistive device: Rolling walker (2 wheeled) Gait Pattern/deviations: Step-to pattern;Decreased step length - right;Decreased step length - left;Decreased stride length;Decreased weight shift to right Gait velocity: decr   General Gait Details: patient requiring increased encouragement this pm to participate with therapy. self limiting   Stairs             Wheelchair Mobility    Modified Rankin (Stroke Patients Only)       Balance Overall balance assessment: Needs assistance Sitting-balance support: Feet supported Sitting balance-Leahy Scale: Fair Sitting balance - Comments: patient able to maintain sitting balance this pm with supervision Postural control: Posterior lean Standing balance support: Bilateral upper extremity supported;During functional activity Standing balance-Leahy Scale: Fair Standing balance comment: min guard for standing  balance. Reliant on RW                            Cognition Arousal/Alertness: Awake/alert Behavior During Therapy: [facility] for tasks assessed/performed Overall Cognitive Status: Within Functional Limits for tasks assessed  General Comments: Disoriented to time, but generally able to follow VCs consistently.      Exercises Total Joint Exercises Ankle Circles/Pumps: AROM;5 reps;Both Heel Slides: AAROM;5 reps;Right Hip ABduction/ADduction: AAROM;Right;5 reps Other Exercises Other Exercises: OT/PT facilitate functional mobility including sit>sup, STS from EOB, functional transfer in room and [facility] training with cues to  maintain PWB status and safety education provided t/o session.    General Comments        Pertinent Vitals/Pain Pain Assessment: Faces Faces Pain Scale: Hurts even more Pain Location: R LE  and L LE Pain Descriptors / Indicators: Aching;Sore;Discomfort;Grimacing;Guarding Pain Intervention(s): Monitored during session;Repositioned;Limited activity within patient's tolerance    Home Living                      Prior Function            PT Goals (current goals can now be found in the care plan section) Acute Rehab PT Goals Patient Stated Goal: "To get stronger so I can get back to my yardwork." PT Goal Formulation: With patient/family Time For Goal Achievement: 08/06/20 Potential to Achieve Goals: Fair Progress towards PT goals: Progressing toward goals    Frequency    BID      PT Plan Current plan remains appropriate    Co-evaluation              AM-PAC PT "6 Clicks" Mobility   Outcome Measure  Help needed turning from your back to your side while in a flat bed without using bedrails?: A Lot Help needed moving from lying on your back to sitting on the side of a flat bed without using bedrails?: A Lot Help needed moving to and from a bed to a chair (including a wheelchair)?: A Little Help needed standing up from a chair using your arms (e.g., wheelchair or bedside chair)?: A Little Help needed to walk in hospital room?: A Little Help needed climbing 3-5 steps with a railing? : A Lot 6 Click Score: 15    End of Session Equipment Utilized During Treatment: Gait belt Activity Tolerance: Patient limited by pain Patient left: in bed;with call bell/phone within reach;with family/visitor present Nurse Communication: Mobility status PT Visit Diagnosis: Unsteadiness on feet (R26.81);Muscle weakness (generalized) (M62.81);Difficulty in walking, not elsewhere classified (R26.2);Other abnormalities of gait and mobility (R26.89);History of falling  (Z91.81);Pain Pain - Right/Left: Right Pain - part of body: Hip;Leg     Time: 1410-1435 PT Time Calculation (min) (ACUTE ONLY): 25 min  Charges:  $Gait Training: 8-22 mins $Therapeutic Exercise: 8-22 mins                     Maida Widger, PT, GCS 07/24/20,2:55 PM

## 2020-07-25 DIAGNOSIS — S72001S Fracture of unspecified part of neck of right femur, sequela: Secondary | ICD-10-CM | POA: Diagnosis not present

## 2020-07-25 DIAGNOSIS — G9341 Metabolic encephalopathy: Secondary | ICD-10-CM | POA: Diagnosis not present

## 2020-07-25 DIAGNOSIS — D500 Iron deficiency anemia secondary to blood loss (chronic): Secondary | ICD-10-CM | POA: Diagnosis not present

## 2020-07-25 LAB — CBC WITH DIFFERENTIAL/PLATELET
Abs Immature Granulocytes: 0.31 10*3/uL — ABNORMAL HIGH (ref 0.00–0.07)
Basophils Absolute: 0 10*3/uL (ref 0.0–0.1)
Basophils Relative: 0 %
Eosinophils Absolute: 0 10*3/uL (ref 0.0–0.5)
Eosinophils Relative: 0 %
HCT: 20.4 % — ABNORMAL LOW (ref 39.0–52.0)
Hemoglobin: 6.6 g/dL — ABNORMAL LOW (ref 13.0–17.0)
Immature Granulocytes: 4 %
Lymphocytes Relative: 26 %
Lymphs Abs: 2.3 10*3/uL (ref 0.7–4.0)
MCH: 30 pg (ref 26.0–34.0)
MCHC: 32.4 g/dL (ref 30.0–36.0)
MCV: 92.7 fL (ref 80.0–100.0)
Monocytes Absolute: 2.3 10*3/uL — ABNORMAL HIGH (ref 0.1–1.0)
Monocytes Relative: 26 %
Neutro Abs: 3.9 10*3/uL (ref 1.7–7.7)
Neutrophils Relative %: 44 %
Platelets: 112 10*3/uL — ABNORMAL LOW (ref 150–400)
RBC: 2.2 MIL/uL — ABNORMAL LOW (ref 4.22–5.81)
RDW: 17.5 % — ABNORMAL HIGH (ref 11.5–15.5)
Smear Review: NORMAL
WBC: 8.8 10*3/uL (ref 4.0–10.5)
nRBC: 0.5 % — ABNORMAL HIGH (ref 0.0–0.2)

## 2020-07-25 LAB — PREPARE RBC (CROSSMATCH)

## 2020-07-25 LAB — IRON AND TIBC
Iron: 17 ug/dL — ABNORMAL LOW (ref 45–182)
Saturation Ratios: 9 % — ABNORMAL LOW (ref 17.9–39.5)
TIBC: 197 ug/dL — ABNORMAL LOW (ref 250–450)
UIBC: 180 ug/dL

## 2020-07-25 LAB — VITAMIN B12: Vitamin B-12: 214 pg/mL (ref 180–914)

## 2020-07-25 MED ORDER — SODIUM CHLORIDE 0.9 % IV SOLN
300.0000 mg | Freq: Once | INTRAVENOUS | Status: AC
  Administered 2020-07-25: 300 mg via INTRAVENOUS
  Filled 2020-07-25: qty 15

## 2020-07-25 MED ORDER — SODIUM CHLORIDE 0.9% IV SOLUTION: Freq: Once | INTRAVENOUS | Status: AC

## 2020-07-25 NOTE — Progress Notes (Signed)
PROGRESS NOTE    Dustin Howell  [id number] DOB: 1924/10/04 DOA: 07/20/2020 PCP: [name], MD   Chief complaint: hip pain Brief Narrative:  Patient admitted on 07/20/2020 with fall and found to have a right hip fracture. Patient had ORIF by Dr. Sabra Heck on 07/21/2020. Patient had postoperative anemia with hemoglobin dropping down to 6.4 and was given 1 unit of irradiated blood.  1/13. Hemoglobin dropped down to 6.6, give another units PRBC. Also gave IV iron for iron deficiency.   Assessment & Plan:   Active Problems:   Closed fracture of right hip requiring operative repair [facility])   Iron deficiency anemia due to chronic blood loss   Lymphoproliferative disorder (HCC)   Thrombocytopenia (HCC)   Essential hypertension   Hyperlipidemia   Scalp laceration   Stage 2 chronic kidney disease   Acute metabolic encephalopathy   Protein-calorie malnutrition, severe  1. Acute blood loss anemia secondary to surgery. Iron deficient anemia Hemoglobin dropped down to 6.6, give another unit PRBC and IV iron.  2. Right hip fracture s/p surgery P Patient doing well today.  3. Acute metabolic cephalopathy. Condition has improved  4. Severe protein calorie malnutrition. Continue supplements    DVT prophylaxis: Lovenox Code Status: Full Family Communication: Updated on the phone Disposition Plan:  .   Status is: Inpatient  Remains inpatient appropriate because:Inpatient level of care appropriate due to severity of illness   Dispo: The patient is from: Home              Anticipated d/c is to: SNF              Anticipated d/c date is: 1 day              Patient currently is not medically stable to d/c.        I/O last 3 completed shifts: In: 600 [P.O.:600] Out: 750 [Urine:750] No intake/output data recorded.     Consultants:   Orthopedics  Procedures: Hip  Antimicrobials: None  Subjective: Patient had a large bowel movement, loose. Discontinued scheduled  senna. Patient also had some confusion last night, he called the daughter at 2 AM. Any short of breath or cough. No abdominal pain or nausea vomiting. No fever or chills  Objective: Vitals:   07/24/20 1528 07/24/20 1935 07/25/20 0647 07/25/20 0813  BP: (!) 134/59 128/60 (!) 118/54 (!) 117/54  Pulse: 96 98 82 71  Resp: 16 15 16 15   Temp: 98.8 F (37.1 C) 99.1 F (37.3 C) 98.2 F (36.8 C) 98.2 F (36.8 C)  TempSrc:      SpO2: 97% 98% 100% 100%  Weight:      Height:        Intake/Output Summary (Last 24 hours) at 07/25/2020 0958 Last data filed at 07/25/2020 0600 Gross per 24 hour  Intake 600 ml  Output 350 ml  Net 250 ml   Filed Weights   07/20/20 1227  Weight: 47.6 kg    Examination:  General exam: Appears calm and comfortable, malnourished Respiratory system: Clear to auscultation. Respiratory effort normal. Cardiovascular system: S1 & S2 heard, RRR. No JVD, murmurs, rubs, gallops or clicks. No pedal edema. Gastrointestinal system: Abdomen is nondistended, soft and nontender. No organomegaly or masses felt. Normal bowel sounds heard. Central nervous system: Alert and oriented x2. No focal neurological deficits. Extremities: Symmetric  Skin: No rashes, lesions or ulcers Psychiatry:  Mood & affect appropriate.     Data Reviewed: I have personally reviewed following labs  and imaging studies  CBC: Recent Labs  Lab 07/20/20 1306 07/21/20 0534 07/21/20 1051 07/22/20 0554 07/22/20 1734 07/23/20 0616 07/25/20 0757  WBC 9.0 14.5* 18.1* 18.4*  --  14.5* 8.8  NEUTROABS 4.6  --   --  8.6*  --   --  3.9  HGB 10.3* 7.8* 8.5* 6.4* 8.2* 7.7* 6.6*  HCT 32.3* 24.3* 26.7* 19.7* 24.5* 22.8* 20.4*  MCV 95.8 95.3 95.7 93.4  --  89.4 92.7  PLT 127* 101* 87* 80*  --  71* [id number]*   Basic Metabolic Panel: Recent Labs  Lab 07/20/20 1306 07/21/20 0534 07/23/20 0616  NA 137 141 136  K 3.9 4.9 3.9  CL 102 108 101  CO2 25 26 24   GLUCOSE 121* 137* 99  BUN 22 30* 25*   CREATININE 0.95 1.17 1.06  CALCIUM 9.1 8.7* 8.2*   GFR: Estimated Creatinine Clearance: 28.7 mL/min (by C-G formula based on SCr of 1.06 mg/dL). Liver Function Tests: No results for input(s): AST, ALT, ALKPHOS, BILITOT, PROT, ALBUMIN in the last 168 hours. No results for input(s): LIPASE, AMYLASE in the last 168 hours. No results for input(s): AMMONIA in the last 168 hours. Coagulation Profile: Recent Labs  Lab 07/20/20 1309  INR 1.0   Cardiac Enzymes: No results for input(s): CKTOTAL, CKMB, CKMBINDEX, TROPONINI in the last 168 hours. BNP (last 3 results) No results for input(s): PROBNP in the last 8760 hours. HbA1C: No results for input(s): HGBA1C in the last 72 hours. CBG: Recent Labs  Lab 07/23/20 0738  GLUCAP 98   Lipid Profile: No results for input(s): CHOL, HDL, LDLCALC, TRIG, CHOLHDL, LDLDIRECT in the last 72 hours. Thyroid Function Tests: No results for input(s): TSH, T4TOTAL, FREET4, T3FREE, THYROIDAB in the last 72 hours. Anemia Panel: Recent Labs    07/25/20 0757  TIBC 197*  IRON 17*   Sepsis Labs: No results for input(s): PROCALCITON, LATICACIDVEN in the last 168 hours.  Recent Results (from the past 240 hour(s))  SARS CORONAVIRUS 2 (TAT 6-24 HRS) Nasopharyngeal Nasopharyngeal Swab     Status: None   Collection Time: 07/20/20 12:45 PM   Specimen: Nasopharyngeal Swab  Result Value Ref Range Status   SARS Coronavirus 2 NEGATIVE NEGATIVE Final    Comment: (NOTE) SARS-CoV-2 target nucleic acids are NOT DETECTED.  The SARS-CoV-2 RNA is generally detectable in upper and lower respiratory specimens during the acute phase of infection. Negative results do not preclude SARS-CoV-2 infection, do not rule out co-infections with other pathogens, and should not be used as the sole basis for treatment or other patient management decisions. Negative results must be combined with clinical observations, patient history, and epidemiological information. The  expected result is Negative.  Fact Sheet for Patients: SugarRoll.be  Fact Sheet for Healthcare Providers: https://www.woods-mathews.com/  This test is not yet approved or cleared by the [place] FDA and  has been authorized for detection and/or diagnosis of SARS-CoV-2 by FDA under an Emergency Use Authorization (EUA). This EUA will remain  in effect (meaning this test can be used) for the duration of the COVID-19 declaration under Se ction 564(b)(1) of the Act, 21 U.S.C. section 360bbb-3(b)(1), unless the authorization is terminated or revoked sooner.  Performed at [facility], [place] [address] [place], [place] [address]   Surgical PCR screen     Status: Abnormal   Collection Time: 07/21/20 12:06 AM   Specimen: Nasal Mucosa; Nasal Swab  Result Value Ref Range Status   MRSA, PCR NEGATIVE NEGATIVE Final  Staphylococcus aureus POSITIVE (A) NEGATIVE Final    Comment: (NOTE) The Xpert SA Assay (FDA approved for NASAL specimens in patients 14 years of age and older), is one component of a comprehensive surveillance program. It is not intended to diagnose infection nor to guide or monitor treatment. Performed at [facility], [address] [place], [place] [address]   Resp Panel by RT-PCR (Flu A&B, Covid) Nasopharyngeal Swab     Status: None   Collection Time: 07/24/20  2:30 PM   Specimen: Nasopharyngeal Swab; Nasopharyngeal(NP) swabs in vial transport medium  Result Value Ref Range Status   SARS Coronavirus 2 by RT PCR NEGATIVE NEGATIVE Final    Comment: (NOTE) SARS-CoV-2 target nucleic acids are NOT DETECTED.  The SARS-CoV-2 RNA is generally detectable in upper respiratory specimens during the acute phase of infection. The lowest concentration of SARS-CoV-2 viral copies this assay can detect is 138 copies/mL. A negative result does not preclude SARS-Cov-2 infection and should not be used as the sole basis for  treatment or other patient management decisions. A negative result may occur with  improper specimen collection/handling, submission of specimen other than nasopharyngeal swab, presence of viral mutation(s) within the areas targeted by this assay, and inadequate number of viral copies(<138 copies/mL). A negative result must be combined with clinical observations, patient history, and epidemiological information. The expected result is Negative.  Fact Sheet for Patients:  EntrepreneurPulse.com.au  Fact Sheet for Healthcare Providers:  IncredibleEmployment.be  This test is no t yet approved or cleared by the [place] FDA and  has been authorized for detection and/or diagnosis of SARS-CoV-2 by FDA under an Emergency Use Authorization (EUA). This EUA will remain  in effect (meaning this test can be used) for the duration of the COVID-19 declaration under Section 564(b)(1) of the Act, 21 U.S.C.section 360bbb-3(b)(1), unless the authorization is terminated  or revoked sooner.       Influenza A by PCR NEGATIVE NEGATIVE Final   Influenza B by PCR NEGATIVE NEGATIVE Final    Comment: (NOTE) The Xpert Xpress SARS-CoV-2/FLU/RSV plus assay is intended as an aid in the diagnosis of influenza from Nasopharyngeal swab specimens and should not be used as a sole basis for treatment. Nasal washings and aspirates are unacceptable for Xpert Xpress SARS-CoV-2/FLU/RSV testing.  Fact Sheet for Patients: EntrepreneurPulse.com.au  Fact Sheet for Healthcare Providers: IncredibleEmployment.be  This test is not yet approved or cleared by the [place] FDA and has been authorized for detection and/or diagnosis of SARS-CoV-2 by FDA under an Emergency Use Authorization (EUA). This EUA will remain in effect (meaning this test can be used) for the duration of the COVID-19 declaration under Section 564(b)(1) of the Act, 21  U.S.C. section 360bbb-3(b)(1), unless the authorization is terminated or revoked.  Performed at [facility], [address] [place], [place] [address]          Radiology Studies: No results found.      Scheduled Meds: . amLODipine  5 mg Oral Daily  . atorvastatin  10 mg Oral Daily  . cephALEXin  250 mg Oral Q8H  . diclofenac Sodium  2 g Topical QID  . enoxaparin (LOVENOX) injection  30 mg Subcutaneous Q24H  . feeding supplement  237 mL Oral BID BM  . ferrous sulfate  325 mg Oral BID WC  . multivitamin with minerals  1 tablet Oral Daily  . mupirocin ointment  1 application Nasal BID  . senna  1 tablet Oral BID   Continuous  Infusions: . iron sucrose       LOS: 5 days    Time spent: 28 minutes    Sharen Hones, MD [facility]   To contact the attending provider between 7A-7P or the covering provider during after hours 7P-7A, please log into the web site [email or web address] and access using universal [place] password for that web site. If you do not have the password, please call the hospital operator.  07/25/2020, 9:58 AM

## 2020-07-25 NOTE — Progress Notes (Signed)
Physical Therapy Treatment Patient Details Name: Dustin Howell MRN: [id number] DOB: [birth year] Today's Date: 07/25/2020    History of Present Illness Dustin Howell is an 87 y.o. male.  At baseline, patient lives by self and is ADL independent.  He had been in his usual state of health until today when he tripped over a rug in his home.  He lost his balance and fell backwards.  He fell on his right hip and also bumped his head.  Post fall, patient was unable to get up due to pain and loss of function.  He was able to call family members with his CELL phone.  Patient has a significant past medical history of chronic anemia, recurrent lower GI bleeding with findings of AVMs and diverticulosis on flex sigmoidoscopy on his last admission 1 month ago.    PT Comments    Patient received in bed, agreeable to PT session. Patient requires mod assist with bed mobility to raise trunk to sitting position. Min assist for sit to stand, and ambulating 20 feet with RW and PWB on right LE. Patient will continue to benefit from skilled PT while here to improve functional independence and strength.     Follow Up Recommendations  SNF;Supervision/Assistance - 24 hour     Equipment Recommendations  Rolling walker with 5" wheels    Recommendations for Other Services       Precautions / Restrictions Precautions Precautions: Fall Restrictions Weight Bearing Restrictions: Yes RLE Weight Bearing: Partial weight bearing    Mobility  Bed Mobility Overal bed mobility: Needs Assistance Bed Mobility: Supine to Sit     Supine to sit: Mod assist     General bed mobility comments: mod to max assist to raise trunk to sitting position. Min assist with LEs off the bed.  Transfers Overall transfer level: Needs assistance Equipment used: Rolling walker (2 wheeled) Transfers: Sit to/from Stand Sit to Stand: Min assist         General transfer comment: Cues for hand placement and PWB on R  LE  Ambulation/Gait Ambulation/Gait assistance: Min assist Gait Distance (Feet): 20 Feet Assistive device: Rolling walker (2 wheeled) Gait Pattern/deviations: Step-to pattern;Decreased step length - right;Decreased step length - left;Decreased stride length;Decreased weight shift to left;Shuffle Gait velocity: decr       Stairs             Wheelchair Mobility    Modified Rankin (Stroke Patients Only)       Balance Overall balance assessment: Needs assistance Sitting-balance support: Feet supported Sitting balance-Leahy Scale: Fair Sitting balance - Comments: patient able to maintain sitting balance this pm with supervision   Standing balance support: Bilateral upper extremity supported;During functional activity Standing balance-Leahy Scale: Fair Standing balance comment: min guard for standing  balance. Reliant on RW                            Cognition Arousal/Alertness: Awake/alert Behavior During Therapy: [facility] for tasks assessed/performed Overall Cognitive Status: Within Functional Limits for tasks assessed                                 General Comments: Disoriented to time, but generally able to follow VCs consistently.      Exercises Total Joint Exercises Ankle Circles/Pumps: AROM;5 reps;Both Quad Sets: AROM;5 reps;Both Heel Slides: AAROM;5 reps;Both Hip ABduction/ADduction: AAROM;5 reps;Both    General Comments  Pertinent Vitals/Pain Pain Assessment: Faces Faces Pain Scale: Hurts little more Pain Location: R LE  and L LE Pain Descriptors / Indicators: Aching;Sore;Discomfort;Grimacing;Guarding Pain Intervention(s): Limited activity within patient's tolerance;Monitored during session;Repositioned    Home Living                      Prior Function            PT Goals (current goals can now be found in the care plan section) Acute Rehab PT Goals Patient Stated Goal: "To get stronger so I can get back  to my yardwork." PT Goal Formulation: With patient Time For Goal Achievement: 08/06/20 Potential to Achieve Goals: Fair Progress towards PT goals: Progressing toward goals    Frequency    BID      PT Plan Current plan remains appropriate    Co-evaluation              AM-PAC PT "6 Clicks" Mobility   Outcome Measure  Help needed turning from your back to your side while in a flat bed without using bedrails?: A Lot Help needed moving from lying on your back to sitting on the side of a flat bed without using bedrails?: A Lot Help needed moving to and from a bed to a chair (including a wheelchair)?: A Little Help needed standing up from a chair using your arms (e.g., wheelchair or bedside chair)?: A Little Help needed to walk in hospital room?: A Little Help needed climbing 3-5 steps with a railing? : A Lot 6 Click Score: 15    End of Session Equipment Utilized During Treatment: Gait belt Activity Tolerance: Patient limited by pain Patient left: with call bell/phone within reach;in chair;with chair alarm set Nurse Communication: Mobility status PT Visit Diagnosis: Unsteadiness on feet (R26.81);Muscle weakness (generalized) (M62.81);Difficulty in walking, not elsewhere classified (R26.2);Other abnormalities of gait and mobility (R26.89);History of falling (Z91.81);Pain Pain - Right/Left: Right Pain - part of body: Hip;Leg     Time: 7619-5093 PT Time Calculation (min) (ACUTE ONLY): 24 min  Charges:  $Gait Training: 8-22 mins $Therapeutic Exercise: 8-22 mins                     [employer], PT, GCS 07/25/20,11:23 AM

## 2020-07-25 NOTE — Progress Notes (Signed)
Subjective:  POD #4 s/p intramedullary fixation for right intertrochanteric hip fracture.   Patient reports bilateral lower extremity pain.  He believes it is due to arthritis.  Patient had a hemoglobin of 6.6 today and is going to receive a transfusion of PRBCs.  Objective:   VITALS:   Vitals:   07/24/20 1935 07/25/20 0647 07/25/20 0813 07/25/20 1224  BP: 128/60 (!) 118/54 (!) 117/54 (!) 149/64  Pulse: 98 82 71 99  Resp: 15 16 15 16   Temp: 99.1 F (37.3 C) 98.2 F (36.8 C) 98.2 F (36.8 C) 98.2 F (36.8 C)  TempSrc:    Oral  SpO2: 98% 100% 100% 99%  Weight:      Height:        PHYSICAL EXAM: Right lower extremity Neurovascular intact Dorsiflexion/Plantar flexion intact Incision: scant drainage No cellulitis present Compartment soft  LABS  Results for orders placed or performed during the hospital encounter of 07/20/20 (from the past 24 hour(s))  Resp Panel by RT-PCR (Flu A&B, Covid) Nasopharyngeal Swab     Status: None   Collection Time: 07/24/20  2:30 PM   Specimen: Nasopharyngeal Swab; Nasopharyngeal(NP) swabs in vial transport medium  Result Value Ref Range   SARS Coronavirus 2 by RT PCR NEGATIVE NEGATIVE   Influenza A by PCR NEGATIVE NEGATIVE   Influenza B by PCR NEGATIVE NEGATIVE  CBC with Differential/Platelet     Status: Abnormal   Collection Time: 07/25/20  7:57 AM  Result Value Ref Range   WBC 8.8 4.0 - 10.5 K/uL   RBC 2.20 (L) 4.22 - 5.81 MIL/uL   Hemoglobin 6.6 (L) 13.0 - 17.0 g/dL   HCT 20.4 (L) 39.0 - 52.0 %   MCV 92.7 80.0 - 100.0 fL   MCH 30.0 26.0 - 34.0 pg   MCHC 32.4 30.0 - 36.0 g/dL   RDW 17.5 (H) 11.5 - 15.5 %   Platelets 112 (L) 150 - 400 K/uL   nRBC 0.5 (H) 0.0 - 0.2 %   Neutrophils Relative % 44 %   Neutro Abs 3.9 1.7 - 7.7 K/uL   Lymphocytes Relative 26 %   Lymphs Abs 2.3 0.7 - 4.0 K/uL   Monocytes Relative 26 %   Monocytes Absolute 2.3 (H) 0.1 - 1.0 K/uL   Eosinophils Relative 0 %   Eosinophils Absolute 0.0 0.0 - 0.5 K/uL    Basophils Relative 0 %   Basophils Absolute 0.0 0.0 - 0.1 K/uL   WBC Morphology MORPHOLOGY UNREMARKABLE    RBC Morphology MIXED RBC POPULATION    Smear Review Normal platelet morphology    Immature Granulocytes 4 %   Abs Immature Granulocytes 0.31 (H) 0.00 - 0.07 K/uL   Polychromasia PRESENT   Iron and TIBC     Status: Abnormal   Collection Time: 07/25/20  7:57 AM  Result Value Ref Range   Iron 17 (L) 45 - 182 ug/dL   TIBC 197 (L) 250 - 450 ug/dL   Saturation Ratios 9 (L) 17.9 - 39.5 %   UIBC 180 ug/dL  Vitamin B12     Status: None   Collection Time: 07/25/20  7:57 AM  Result Value Ref Range   Vitamin B-12 214 180 - 914 pg/mL  Prepare RBC (crossmatch)     Status: None (Preliminary result)   Collection Time: 07/25/20  9:16 AM  Result Value Ref Range   Order Confirmation PENDING   Type and screen [facility]     Status: None (Preliminary result)  Collection Time: 07/25/20 12:22 PM  Result Value Ref Range   ABO/RH(D) PENDING    Antibody Screen PENDING    Sample Expiration      07/28/2020,2359 Performed at [facility], [address] [place], [place] [address]     No results found.  Assessment/Plan: 4 Days Post-Op   Active Problems:   Closed fracture of right hip requiring operative repair ([place])   Iron deficiency anemia due to chronic blood loss   Lymphoproliferative disorder (HCC)   Thrombocytopenia (HCC)   Essential hypertension   Hyperlipidemia   Scalp laceration   Stage 2 chronic kidney disease   Acute metabolic encephalopathy   Protein-calorie malnutrition, severe  The patient has postop anemia and is being transfused for this today.  Patient will continue physical therapy when medically appropriate.  Continue current pain management.  Patient is also receiving IV iron for anemia.    [place] , MD 07/25/2020, 1:47 PM

## 2020-07-25 NOTE — TOC Progression Note (Signed)
Transition of Care [facility]) - Progression Note    Patient Details  Name: Dustin Howell MRN: [id number] Date of Birth: 1925-06-05  Transition of Care [facility]) CM/SW [place], RN Phone Number: 07/25/2020, 3:00 PM  Clinical Narrative:   RNCM received insurance authorization through [employer] portal with reference number of [id number] and plan auth ID of [id number]. Authorization is good 1/13-1/17. RNCM will facilitate transfer to SNF once medically ready for discharge.     Expected Discharge Plan: [place] Barriers to Discharge: No Barriers Identified  Expected Discharge Plan and Services Expected Discharge Plan: [place] arrangements for the past 2 months: Single Family Home                                       Social Determinants of Health (SDOH) Interventions    Readmission Risk Interventions No flowsheet data found.

## 2020-07-25 NOTE — Progress Notes (Signed)
PT Cancellation Note  Patient Details Name: Dustin Howell MRN: [id number] DOB: 1924-08-25   Cancelled Treatment:    Reason Eval/Treat Not Completed: Patient declined, no reason specified. Patient had just gotten out of chair to [facility] and then got back into bed. Declines further mobility at this time. Will re-attempt later if time allows.    [name] 07/25/2020, 2:40 PM

## 2020-07-25 NOTE — Plan of Care (Signed)
Patient progressing towards goal for discharge.  Problem: Clinical Measurements: Goal: Will remain free from infection 07/25/2020 0546 by Guinevere Ferrari, RN Outcome: Progressing 07/25/2020 0538 by Guinevere Ferrari, RN Outcome: Progressing   Problem: Activity: Goal: Risk for activity intolerance will decrease 07/25/2020 0546 by Guinevere Ferrari, RN Outcome: Progressing 07/25/2020 0538 by Guinevere Ferrari, RN Outcome: Progressing   Problem: Pain Managment: Goal: General experience of comfort will improve 07/25/2020 0546 by Guinevere Ferrari, RN Outcome: Progressing 07/25/2020 0538 by Guinevere Ferrari, RN Outcome: Progressing   Problem: Safety: Goal: Ability to remain free from injury will improve 07/25/2020 0546 by Guinevere Ferrari, RN Outcome: Progressing 07/25/2020 0538 by Guinevere Ferrari, RN Outcome: Progressing

## 2020-07-26 DIAGNOSIS — G9341 Metabolic encephalopathy: Secondary | ICD-10-CM | POA: Diagnosis not present

## 2020-07-26 DIAGNOSIS — S72001S Fracture of unspecified part of neck of right femur, sequela: Secondary | ICD-10-CM | POA: Diagnosis not present

## 2020-07-26 DIAGNOSIS — D500 Iron deficiency anemia secondary to blood loss (chronic): Secondary | ICD-10-CM | POA: Diagnosis not present

## 2020-07-26 LAB — CBC WITH DIFFERENTIAL/PLATELET
Abs Immature Granulocytes: 0.48 10*3/uL — ABNORMAL HIGH (ref 0.00–0.07)
Basophils Absolute: 0 10*3/uL (ref 0.0–0.1)
Basophils Relative: 0 %
Eosinophils Absolute: 0 10*3/uL (ref 0.0–0.5)
Eosinophils Relative: 0 %
HCT: 26 % — ABNORMAL LOW (ref 39.0–52.0)
Hemoglobin: 8.7 g/dL — ABNORMAL LOW (ref 13.0–17.0)
Immature Granulocytes: 4 %
Lymphocytes Relative: 27 %
Lymphs Abs: 3.4 10*3/uL (ref 0.7–4.0)
MCH: 29.6 pg (ref 26.0–34.0)
MCHC: 33.5 g/dL (ref 30.0–36.0)
MCV: 88.4 fL (ref 80.0–100.0)
Monocytes Absolute: 3.3 10*3/uL — ABNORMAL HIGH (ref 0.1–1.0)
Monocytes Relative: 26 %
Neutro Abs: 5.3 10*3/uL (ref 1.7–7.7)
Neutrophils Relative %: 43 %
Platelets: 126 10*3/uL — ABNORMAL LOW (ref 150–400)
RBC: 2.94 MIL/uL — ABNORMAL LOW (ref 4.22–5.81)
RDW: 18.6 % — ABNORMAL HIGH (ref 11.5–15.5)
Smear Review: NORMAL
WBC: 12.6 10*3/uL — ABNORMAL HIGH (ref 4.0–10.5)
nRBC: 0.9 % — ABNORMAL HIGH (ref 0.0–0.2)

## 2020-07-26 LAB — OCCULT BLOOD X 1 CARD TO LAB, STOOL: Fecal Occult Bld: NEGATIVE

## 2020-07-26 MED ORDER — HYDROCODONE-ACETAMINOPHEN 5-325 MG PO TABS: 1.0000 | ORAL_TABLET | ORAL | 0 refills | Status: DC | PRN

## 2020-07-26 MED ORDER — ENOXAPARIN SODIUM 30 MG/0.3ML ~~LOC~~ SOLN: 30.0000 mg | SUBCUTANEOUS | Status: DC

## 2020-07-26 MED ORDER — ENSURE ENLIVE PO LIQD: 237.0000 mL | Freq: Two times a day (BID) | ORAL | 12 refills | Status: AC

## 2020-07-26 MED ORDER — ACETAMINOPHEN 325 MG PO TABS: 325.0000 mg | ORAL_TABLET | Freq: Four times a day (QID) | ORAL | Status: AC | PRN

## 2020-07-26 MED ORDER — FERROUS SULFATE 325 (65 FE) MG PO TABS: 325.0000 mg | ORAL_TABLET | Freq: Two times a day (BID) | ORAL | 3 refills | Status: DC

## 2020-07-26 MED ORDER — BISACODYL 10 MG RE SUPP: 10.0000 mg | Freq: Every day | RECTAL | 0 refills | Status: DC | PRN

## 2020-07-26 NOTE — Plan of Care (Addendum)
Pt alert, on room air, afebrile, VSS. Pt denied any concerns for pain, nausea, Headache or SOB. Pt refused his Keflex (see MAR). Daughter called and was unable to convince pt to take it, pt in bed setting off the bed alarm on multiple occasions. Bed alarm on, falls precautions in place, call bell within reach  0620. Pt found on his bed incontinent of stool. Alert and confused. Pi is given a bed bath. Falls precautions remained in place  07/26/20 0024  Vitals  Temp 98.8 F (37.1 C)  BP 134/81  MAP (mmHg) 91  BP Location Left Arm  BP Method Automatic  Patient Position (if appropriate) Lying  Pulse Rate 89  Pulse Rate Source Monitor  Resp 20  MEWS COLOR  MEWS Score Color Green  Oxygen Therapy  SpO2 97 %  O2 Device Room Air  MEWS Score  MEWS Temp 0  MEWS Systolic 0  MEWS Pulse 0  MEWS RR 0  MEWS LOC 0  MEWS Score 0   Problem: Education: Goal: Knowledge of General Education information will improve Description: Including pain rating scale, medication(s)/side effects and non-pharmacologic comfort measures Outcome: Progressing   Problem: Pain Managment: Goal: General experience of comfort will improve Outcome: Progressing   Problem: Skin Integrity: Goal: Risk for impaired skin integrity will decrease Outcome: Progressing

## 2020-07-26 NOTE — Discharge Summary (Signed)
Physician Discharge Summary  Patient ID: Dustin Howell MRN: [id number] DOB/AGE: 1925/01/19 87 y.o.  Admit date: 07/20/2020 Discharge date: 07/26/2020  Admission Diagnoses:  Discharge Diagnoses:  Active Problems:   Closed fracture of right hip requiring operative repair [facility])   Iron deficiency anemia due to chronic blood loss   Lymphoproliferative disorder (HCC)   Thrombocytopenia (HCC)   Essential hypertension   Hyperlipidemia   Scalp laceration   Stage 2 chronic kidney disease   Acute metabolic encephalopathy   Protein-calorie malnutrition, severe   Discharged Condition: fair  Hospital Course:  Patient admitted on 07/20/2020 with fall and found to have a right hip fracture. Patient had ORIF by Dr. Sabra Heck on 07/21/2020. Patient had postoperative anemia with hemoglobin dropping down to 6.4 and was given 1 unit of irradiated blood.  1/13. Hemoglobin dropped down to 6.6, give another units PRBC. Also gave IV iron for iron deficiency.  1. Acute blood loss anemia secondary to surgery. Iron deficient anemia. No evidence of GI bleed, stool occult blood and negative. Hemoglobin dropped down to 6.6, received another unit PRBC and IV iron.  2. Right hip fracture s/p surgery Patient doing well today.  3. Acute metabolic cephalopathy. Dementia at baseline Condition has improved  4. Severe protein calorie malnutrition. Continue supplements    Consults: orthopedic surgery  Significant Diagnostic Studies:   Treatments: Surgery  Discharge Exam: Blood pressure 130/69, pulse 79, temperature 98.2 F (36.8 C), resp. rate 16, height 5\' 7"  (1.702 m), weight 47.6 kg, SpO2 100 %. General appearance: alert, cooperative and Oriented to place and person. Resp: clear to auscultation bilaterally Cardio: regular rate and rhythm, S1, S2 normal, no murmur, click, rub or gallop GI: soft, non-tender; bowel sounds normal; no masses,  no organomegaly Extremities: extremities normal, atraumatic, no  cyanosis or edema  Disposition: Discharge disposition: 03-Skilled Nursing Facility       Discharge Instructions    Diet - low sodium heart healthy   Complete by: As directed    Discharge wound care:   Complete by: As directed    RN follow   Increase activity slowly   Complete by: As directed      Allergies as of 07/26/2020   No Known Allergies     Medication List    TAKE these medications   acetaminophen 325 MG tablet Commonly known as: TYLENOL Take 1-2 tablets (325-650 mg total) by mouth every 6 (six) hours as needed for mild pain (pain score 1-3 or temp > 100.5).   amLODipine 5 MG tablet Commonly known as: NORVASC Take 1 tablet (5 mg total) by mouth daily.   aspirin 81 MG chewable tablet Chew 1 tablet (81 mg total) by mouth daily.   atorvastatin 10 MG tablet Commonly known as: LIPITOR Take 10 mg by mouth daily.   bisacodyl 10 MG suppository Commonly known as: DULCOLAX Place 1 suppository (10 mg total) rectally daily as needed for moderate constipation.   enoxaparin 30 MG/0.3ML injection Commonly known as: LOVENOX Inject 0.3 mLs (30 mg total) into the skin daily for 14 days. Start taking on: July 27, 2020   feeding supplement Liqd Take 237 mLs by mouth 2 (two) times daily between meals.   ferrous sulfate 325 (65 FE) MG tablet Take 1 tablet (325 mg total) by mouth 2 (two) times daily with a meal.   HYDROcodone-acetaminophen 5-325 MG tablet Commonly known as: NORCO/VICODIN Take 1 tablet by mouth every 4 (four) hours as needed for moderate pain.   nitroGLYCERIN 0.4 MG  SL tablet Commonly known as: NITROSTAT Place 0.4 mg under the tongue every 5 (five) minutes as needed for chest pain.            Discharge Care Instructions  (From admission, onward)         Start     Ordered   07/26/20 0000  Discharge wound care:       Comments: RN follow   07/26/20 1043          Contact information for follow-up providers    Earnestine Leys, MD Follow  up in 2 week(s).   Specialty: Orthopedic Surgery Why: Please make appointment for the patient prior to discharge Contact information: [place] [address] [phone]        [name], MD Follow up in 1 week(s).   Specialty: Family Medicine Contact information: [place] [address] [phone]            Contact information for after-discharge care    Destination    [facility] Preferred SNF .   Service: Skilled Nursing Contact information: [address] [place] [phone]                  Signed: Sharen Hones 07/26/2020, 10:43 AM

## 2020-07-26 NOTE — Progress Notes (Signed)
Occupational Therapy Treatment Patient Details Name: Dustin Howell MRN: [id number] DOB: October 13, 1924 Today's Date: 07/26/2020    History of present illness Dustin Howell is an 87 y.o. male.  At baseline, patient lives by self and is ADL independent.  He had been in his usual state of health until today when he tripped over a rug in his home.  He lost his balance and fell backwards.  He fell on his right hip and also bumped his head.  Post fall, patient was unable to get up due to pain and loss of function.  He was able to call family members with his CELL phone.  Patient has a significant past medical history of chronic anemia, recurrent lower GI bleeding with findings of AVMs and diverticulosis on flex sigmoidoscopy on his last admission 1 month ago.   OT comments  Dustin Howell was seen for OT treatment on this date. Upon arrival to room pt awake/alert, seated upright in recliner with daughter present at bedside. Pt remains pleasantly confused t/o OT session. He endorses seeing items falling down his walls as well as "little blue mice" that are not present. Per chart, pt has been acutely confused since previous date. Pt agreeable to OT tx session and requests to use the bathroom. Pt able to perform toilet transfer to [facility] with MIN A and performs leaning peri-care with supervision for safety. Pt education limited 2/2 cognitive status, however, daughter present and eager to learn about AE/Falls prevention. Pt, caregiver, and provider discuss safety strategies upon DC home including falls prevention strategies such as removing throw rugs, and ensuring safe pathways for pt to use RW. Pt education attempted on use of LH reacher, however, pt cognition limited education this session. He is unable to use [facility] reacher for LB dressing, however does utilize to pick up items off the floor. Pt motivated to "pick up my golf balls with it" therapist provides rolled napkin for pt to practice functional reach with LHR in place of golf  ball. Pt requires moderate cueing for safe use t/o session. Pt progressing toward goals and continues to benefit from skilled OT services to maximize return to PLOF and minimize risk of future falls, injury, caregiver burden, and readmission. Will continue to follow POC. Discharge recommendation remains appropriate.    Follow Up Recommendations  SNF    Equipment Recommendations  3 in 1 bedside commode    Recommendations for Other Services      Precautions / Restrictions Precautions Precautions: Fall Restrictions Weight Bearing Restrictions: Yes RLE Weight Bearing: Partial weight bearing       Mobility Bed Mobility Overal bed mobility: Needs Assistance             General bed mobility comments: Deferred.  Pt in recliner at start/end of session.  Transfers Overall transfer level: Needs assistance Equipment used: Rolling walker (2 wheeled) Transfers: Sit to/from Stand Sit to Stand: Min assist         General transfer comment: Cues for hand placement and PWB on R LE    Balance Overall balance assessment: Needs assistance Sitting-balance support: Feet supported       Standing balance support: Bilateral upper extremity supported;During functional activity Standing balance-Leahy Scale: Fair Standing balance comment: min guard for standing  balance. Reliant on RW                           ADL either performed or assessed with clinical judgement  ADL Overall ADL's : Needs assistance/impaired                     Lower Body Dressing: Sit to/from stand;Moderate assistance Lower Body Dressing Details (indicate cue type and reason): Pt educated on use of AE for LB ADL management. Requires increased cueing for safety and sequencing this date primarily 2/2 cognition. Pt physical strength and activity tolerance improving, however he continues to require MOD A for LB ADL management. Toilet Transfer: BSC;Ambulation;RW;Minimal assistance   Toileting-  Clothing Manipulation and Hygiene: Sitting/lateral lean;Supervision/safety;Set up [place] Manipulation Details (indicate cue type and reason): Pt performs peri-care with seated lateral lean onto non-operative hip. Set-up/supervision level.       General ADL Comments: Pt functional mobility progressing well. He requires initial +2 for assist to come to sitting at EOB, once seated upright pt is able to advance hips forward and perfoms STS with +2 MIN A/MIN Guard. During session he is able to advance functional mobility to +1 assist with MIN A to maintain balance during ambulation in room. Pt and provider discuss safe use of DME for toilet transfer to [facility] in consideration of PWB status. Pt return verbalizes understanding. Coordinated with nsg to assist pt to [facility] once pt ready to have BM.     Vision Baseline Vision/History: Wears glasses Wears Glasses: Reading only Patient Visual Report: No change from baseline     Perception     Praxis      Cognition Arousal/Alertness: Awake/alert Behavior During Therapy: WFL for tasks assessed/performed Overall Cognitive Status: Impaired/Different from baseline Area of Impairment: Orientation;Awareness;Safety/judgement                 Orientation Level: Disoriented to;Place;Time;Situation       Safety/Judgement: Decreased awareness of safety;Decreased awareness of deficits Awareness: Emergent   General Comments: Pt seeing "little blue mice coming in and out of the wall" also endorses seeing items falling off the wall and the wheels on his RW spinning around when it is sitting still. Per daughter, he has been saying things like this since she arrived earlier this afternoon. Pt able to participate in session with increased cueing and time for processing. Remains generally pleasant.        Exercises Other Exercises Other Exercises: Pt/caregiver provided with reinforcement of prior education on safe use of AE/DME for ADL management,  Falls prevention strategies for home and hospital, and routines modifications to support safety and functional indep. upon hospital DC. OT facilitates toilet transfer to [facility] and toileting, as well as functional demonstration and practice with [place]. See ADL section for additional details. Pt education limited by cognitive status this date.   Shoulder Instructions       General Comments RLE bandages in place at start/end of session.    Pertinent Vitals/ Pain       Pain Assessment: Faces Faces Pain Scale: Hurts little more Pain Location: R LE Pain Descriptors / Indicators: Discomfort;Sore;Grimacing;Guarding (Itching) Pain Intervention(s): Limited activity within patient's tolerance;Repositioned;Monitored during session  [place] expects to be discharged to:: Private residence                                        Prior Functioning/Environment              Frequency  Min 1X/week        Progress Toward Goals  OT  Goals(current goals can now be found in the care plan section)  Progress towards OT goals: Progressing toward goals  Acute Rehab OT Goals Patient Stated Goal: To get in the car OT Goal Formulation: With patient/family Time For Goal Achievement: 08/06/20 Potential to Achieve Goals: Good  Plan Discharge plan remains appropriate;Frequency remains appropriate    Co-evaluation                 AM-PAC OT "6 Clicks" Daily Activity     Outcome Measure   Help from another person eating meals?: None Help from another person taking care of personal grooming?: A Little Help from another person toileting, which includes using toliet, bedpan, or urinal?: A Little Help from another person bathing (including washing, rinsing, drying)?: A Lot Help from another person to put on and taking off regular upper body clothing?: A Little Help from another person to put on and taking off regular lower body clothing?: A Lot 6 Click  Score: 17    End of Session Equipment Utilized During Treatment: Gait belt;Rolling walker  OT Visit Diagnosis: Other abnormalities of gait and mobility (R26.89);Muscle weakness (generalized) (M62.81);Pain Pain - Right/Left: Right Pain - part of body: Hip;Knee   Activity Tolerance Patient tolerated treatment well   Patient Left with call bell/phone within reach;with family/visitor present;in chair;with chair alarm set   Nurse Communication          Time: 9935-7017 OT Time Calculation (min): 32 min  Charges: OT General Charges $OT Visit: 1 Visit OT Treatments $Self Care/Home Management : 8-22 mins $Therapeutic Activity: 8-22 mins  Rockney Ghee, M.S., OTR/L Ascom: [phone] 07/26/20, 2:22 PM

## 2020-07-26 NOTE — Progress Notes (Signed)
Physical Therapy Treatment Patient Details Name: Dustin Howell MRN: [id number] DOB: [birth year] Today's Date: 07/26/2020    History of Present Illness Dustin Howell is an 87 y.o. male.  At baseline, patient lives by self and is ADL independent.  He had been in his usual state of health until today when he tripped over a rug in his home.  He lost his balance and fell backwards.  He fell on his right hip and also bumped his head.  Post fall, patient was unable to get up due to pain and loss of function.  He was able to call family members with his CELL phone.  Patient has a significant past medical history of chronic anemia, recurrent lower GI bleeding with findings of AVMs and diverticulosis on flex sigmoidoscopy on his last admission 1 month ago.    PT Comments    Patient received in bed, NT present assisting with breakfast. Patient more subdued this am and seems less engaged. He is talking, but not on task. Not sure what he is talking about. He is calm, pleasant and agreeable to PT session. Patient requires mod assist for bed mobility. Min assist for ambulation and transfers. Cues for WB, sequencing, safety etc. Patient will continue to benefit from skilled PT while here to improve safety, improve functional independence and strength.      Follow Up Recommendations  SNF;Supervision/Assistance - 24 hour     Equipment Recommendations  Rolling walker with 5" wheels    Recommendations for Other Services       Precautions / Restrictions Precautions Precautions: Fall Restrictions Weight Bearing Restrictions: Yes RLE Weight Bearing: Partial weight bearing    Mobility  Bed Mobility Overal bed mobility: Needs Assistance Bed Mobility: Supine to Sit     Supine to sit: Mod assist     General bed mobility comments: mod assist for supine to sit. Heavy assist to raise trunk to seated position  Transfers Overall transfer level: Needs assistance Equipment used: Rolling walker (2  wheeled) Transfers: Sit to/from Stand Sit to Stand: Min assist            Ambulation/Gait Ambulation/Gait assistance: Min Web designer (Feet): 20 Feet Assistive device: Rolling walker (2 wheeled) Gait Pattern/deviations: Step-to pattern;Decreased step length - right;Decreased step length - left;Decreased stride length;Decreased weight shift to left Gait velocity: decr   General Gait Details: antalgic gait, difficulty weight shifting to his right LE due to pain. Cues needed to push through UEs to unweight R LE. Cues for sequencing, but patient does not correct.   Howell             Wheelchair Mobility    Modified Rankin (Stroke Patients Only)       Balance Overall balance assessment: Needs assistance Sitting-balance support: Feet supported Sitting balance-Leahy Scale: Fair Sitting balance - Comments: patient able to maintain sitting balance this pm with supervision   Standing balance support: Bilateral upper extremity supported;During functional activity Standing balance-Leahy Scale: Fair Standing balance comment: min guard for standing  balance. Reliant on RW                            Cognition Arousal/Alertness: Awake/alert Behavior During Therapy: [facility] for tasks assessed/performed Overall Cognitive Status: Impaired/Different from baseline Area of Impairment: Orientation;Awareness                 Orientation Level: Disoriented to;Place;Time;Situation  General Comments: somewhat more lethargic, not making sense when speaking at times.      Exercises Total Joint Exercises Ankle Circles/Pumps: AROM;Both;10 reps Heel Slides: AAROM;Right;10 reps Hip ABduction/ADduction: AAROM;Right;10 reps Long Arc Quad: Right;10 reps;AROM    General Comments        Pertinent Vitals/Pain Pain Assessment: Faces Faces Pain Scale: Hurts little more Pain Location: R LE Pain Descriptors / Indicators:  Discomfort;Sore;Grimacing;Guarding Pain Intervention(s): Monitored during session;Repositioned    Home Living                      Prior Function            PT Goals (current goals can now be found in the care plan section) Acute Rehab PT Goals Patient Stated Goal: none stated PT Goal Formulation: With patient Time For Goal Achievement: 07/26/20 Potential to Achieve Goals: Fair Progress towards PT goals: Progressing toward goals    Frequency    BID      PT Plan Current plan remains appropriate    Co-evaluation              AM-PAC PT "6 Clicks" Mobility   Outcome Measure  Help needed turning from your back to your side while in a flat bed without using bedrails?: A Lot Help needed moving from lying on your back to sitting on the side of a flat bed without using bedrails?: A Lot Help needed moving to and from a bed to a chair (including a wheelchair)?: A Little Help needed standing up from a chair using your arms (e.g., wheelchair or bedside chair)?: A Little Help needed to walk in hospital room?: A Little Help needed climbing 3-5 steps with a railing? : A Lot 6 Click Score: 15    End of Session Equipment Utilized During Treatment: Gait belt Activity Tolerance: Patient limited by pain Patient left: with call bell/phone within reach;in chair;with chair alarm set Nurse Communication: Mobility status PT Visit Diagnosis: Unsteadiness on feet (R26.81);Muscle weakness (generalized) (M62.81);Difficulty in walking, not elsewhere classified (R26.2);Other abnormalities of gait and mobility (R26.89);History of falling (Z91.81);Pain Pain - Right/Left: Right Pain - part of body: Hip;Leg     Time: 1610-9604 PT Time Calculation (min) (ACUTE ONLY): 25 min  Charges:  $Gait Training: 8-22 mins $Therapeutic Exercise: 8-22 mins                      [employer], PT, GCS 07/26/20,9:41 AM

## 2020-07-26 NOTE — Care Management Important Message (Signed)
Important Message  Patient Details  Name: Dustin Howell MRN: [id number] Date of Birth: 13-Oct-1924   Medicare Important Message Given:  Yes     [name] A Cristan Scherzer 07/26/2020, 10:58 AM

## 2020-07-26 NOTE — Progress Notes (Signed)
  Subjective:  POD #5 s/p IM fixation for right IT fracture.   Patient reports right hip pain as mild. Patient is dressed and ready to be discharged to rehab today. His hemoglobin is 8.7 slightly after receiving transfusion yesterday.  Objective:   VITALS:   Vitals:   07/26/20 0024 07/26/20 0416 07/26/20 0839 07/26/20 1243  BP: 134/81 (!) 142/71 130/69 126/73  Pulse: 89 88 79 (!) 106  Resp: 20 17 16 16   Temp: 98.8 F (37.1 C) 98.2 F (36.8 C) 98.2 F (36.8 C) 98.3 F (36.8 C)  TempSrc:      SpO2: 97% 100% 100% 98%  Weight:      Height:        PHYSICAL EXAM: Right lower extremity Neurovascular intact Sensation intact distally Intact pulses distally Dorsiflexion/Plantar flexion intact No cellulitis present Compartment soft  LABS  Results for orders placed or performed during the hospital encounter of 07/20/20 (from the past 24 hour(s))  CBC with Differential/Platelet     Status: Abnormal   Collection Time: 07/26/20  1:39 AM  Result Value Ref Range   WBC 12.6 (H) 4.0 - 10.5 K/uL   RBC 2.94 (L) 4.22 - 5.81 MIL/uL   Hemoglobin 8.7 (L) 13.0 - 17.0 g/dL   HCT 26.0 (L) 39.0 - 52.0 %   MCV 88.4 80.0 - 100.0 fL   MCH 29.6 26.0 - 34.0 pg   MCHC 33.5 30.0 - 36.0 g/dL   RDW 18.6 (H) 11.5 - 15.5 %   Platelets 126 (L) 150 - 400 K/uL   nRBC 0.9 (H) 0.0 - 0.2 %   Neutrophils Relative % 43 %   Neutro Abs 5.3 1.7 - 7.7 K/uL   Lymphocytes Relative 27 %   Lymphs Abs 3.4 0.7 - 4.0 K/uL   Monocytes Relative 26 %   Monocytes Absolute 3.3 (H) 0.1 - 1.0 K/uL   Eosinophils Relative 0 %   Eosinophils Absolute 0.0 0.0 - 0.5 K/uL   Basophils Relative 0 %   Basophils Absolute 0.0 0.0 - 0.1 K/uL   WBC Morphology MILD LEFT SHIFT (1-5% METAS, OCC MYELO, OCC BANDS)    RBC Morphology MORPHOLOGY UNREMARKABLE    Smear Review Normal platelet morphology    Immature Granulocytes 4 %   Abs Immature Granulocytes 0.48 (H) 0.00 - 0.07 K/uL  Occult blood card to lab, stool     Status: None    Collection Time: 07/26/20  8:31 AM  Result Value Ref Range   Fecal Occult Bld NEGATIVE NEGATIVE    No results found.  Assessment/Plan: 5 Days Post-Op   Active Problems:   Closed fracture of right hip requiring operative repair ([place])   Iron deficiency anemia due to chronic blood loss   Lymphoproliferative disorder (HCC)   Thrombocytopenia (HCC)   Essential hypertension   Hyperlipidemia   Scalp laceration   Stage 2 chronic kidney disease   Acute metabolic encephalopathy   Protein-calorie malnutrition, severe  Patient be discharged to rehab and follow-up with Dr. Sabra Heck in 10 to 14 days. Continue physical therapy at rehab. Dr. Sabra Heck has recommended enteric-coated aspirin 325 mg p.o. twice daily x6 weeks for DVT prophylaxis postop.   [place] , MD 07/26/2020, 2:07 PM

## 2020-07-27 LAB — BPAM RBC
Blood Product Expiration Date: 202201172359
ISSUE DATE / TIME: 202201131510
Unit Type and Rh: 9500

## 2020-07-27 LAB — TYPE AND SCREEN
ABO/RH(D): O POS
Antibody Screen: NEGATIVE
Unit division: 0

## 2020-09-12 ENCOUNTER — Inpatient Hospital Stay
Admission: EM | Admit: 2020-09-12 | Discharge: 2020-09-23 | DRG: 871 | Disposition: A | Discharge status: Hospice | Payer: Medicare Other | Attending: Internal Medicine | Admitting: Internal Medicine

## 2020-09-12 ENCOUNTER — Other Ambulatory Visit: Payer: Self-pay

## 2020-09-12 ENCOUNTER — Emergency Department: Payer: Medicare Other

## 2020-09-12 DIAGNOSIS — E875 Hyperkalemia: Secondary | ICD-10-CM | POA: Diagnosis present

## 2020-09-12 DIAGNOSIS — Z66 Do not resuscitate: Secondary | ICD-10-CM | POA: Diagnosis present

## 2020-09-12 DIAGNOSIS — I2699 Other pulmonary embolism without acute cor pulmonale: Secondary | ICD-10-CM | POA: Diagnosis present

## 2020-09-12 DIAGNOSIS — A419 Sepsis, unspecified organism: Secondary | ICD-10-CM | POA: Diagnosis not present

## 2020-09-12 DIAGNOSIS — A0472 Enterocolitis due to Clostridium difficile, not specified as recurrent: Secondary | ICD-10-CM | POA: Diagnosis present

## 2020-09-12 DIAGNOSIS — N179 Acute kidney failure, unspecified: Secondary | ICD-10-CM | POA: Diagnosis present

## 2020-09-12 DIAGNOSIS — E872 Acidosis: Secondary | ICD-10-CM | POA: Diagnosis present

## 2020-09-12 DIAGNOSIS — Z7189 Other specified counseling: Secondary | ICD-10-CM | POA: Diagnosis not present

## 2020-09-12 DIAGNOSIS — A414 Sepsis due to anaerobes: Principal | ICD-10-CM | POA: Diagnosis present

## 2020-09-12 DIAGNOSIS — Z7982 Long term (current) use of aspirin: Secondary | ICD-10-CM | POA: Diagnosis not present

## 2020-09-12 DIAGNOSIS — F17290 Nicotine dependence, other tobacco product, uncomplicated: Secondary | ICD-10-CM | POA: Diagnosis present

## 2020-09-12 DIAGNOSIS — E871 Hypo-osmolality and hyponatremia: Secondary | ICD-10-CM | POA: Diagnosis present

## 2020-09-12 DIAGNOSIS — N182 Chronic kidney disease, stage 2 (mild): Secondary | ICD-10-CM | POA: Diagnosis present

## 2020-09-12 DIAGNOSIS — A0471 Enterocolitis due to Clostridium difficile, recurrent: Secondary | ICD-10-CM | POA: Diagnosis present

## 2020-09-12 DIAGNOSIS — R600 Localized edema: Secondary | ICD-10-CM | POA: Diagnosis present

## 2020-09-12 DIAGNOSIS — E861 Hypovolemia: Secondary | ICD-10-CM | POA: Diagnosis present

## 2020-09-12 DIAGNOSIS — E222 Syndrome of inappropriate secretion of antidiuretic hormone: Secondary | ICD-10-CM | POA: Diagnosis present

## 2020-09-12 DIAGNOSIS — I714 Abdominal aortic aneurysm, without rupture: Secondary | ICD-10-CM | POA: Diagnosis present

## 2020-09-12 DIAGNOSIS — Z955 Presence of coronary angioplasty implant and graft: Secondary | ICD-10-CM

## 2020-09-12 DIAGNOSIS — J90 Pleural effusion, not elsewhere classified: Secondary | ICD-10-CM | POA: Diagnosis present

## 2020-09-12 DIAGNOSIS — Z79899 Other long term (current) drug therapy: Secondary | ICD-10-CM

## 2020-09-12 DIAGNOSIS — Z823 Family history of stroke: Secondary | ICD-10-CM

## 2020-09-12 DIAGNOSIS — Z20822 Contact with and (suspected) exposure to covid-19: Secondary | ICD-10-CM | POA: Diagnosis present

## 2020-09-12 DIAGNOSIS — R601 Generalized edema: Secondary | ICD-10-CM | POA: Diagnosis present

## 2020-09-12 DIAGNOSIS — K219 Gastro-esophageal reflux disease without esophagitis: Secondary | ICD-10-CM | POA: Diagnosis present

## 2020-09-12 DIAGNOSIS — Z833 Family history of diabetes mellitus: Secondary | ICD-10-CM | POA: Diagnosis not present

## 2020-09-12 DIAGNOSIS — I129 Hypertensive chronic kidney disease with stage 1 through stage 4 chronic kidney disease, or unspecified chronic kidney disease: Secondary | ICD-10-CM | POA: Diagnosis present

## 2020-09-12 DIAGNOSIS — R6520 Severe sepsis without septic shock: Secondary | ICD-10-CM | POA: Diagnosis present

## 2020-09-12 DIAGNOSIS — E869 Volume depletion, unspecified: Secondary | ICD-10-CM | POA: Diagnosis present

## 2020-09-12 DIAGNOSIS — Z6821 Body mass index (BMI) 21.0-21.9, adult: Secondary | ICD-10-CM

## 2020-09-12 DIAGNOSIS — Z515 Encounter for palliative care: Secondary | ICD-10-CM

## 2020-09-12 DIAGNOSIS — I2510 Atherosclerotic heart disease of native coronary artery without angina pectoris: Secondary | ICD-10-CM | POA: Diagnosis present

## 2020-09-12 DIAGNOSIS — E43 Unspecified severe protein-calorie malnutrition: Secondary | ICD-10-CM | POA: Diagnosis present

## 2020-09-12 DIAGNOSIS — R54 Age-related physical debility: Secondary | ICD-10-CM | POA: Diagnosis present

## 2020-09-12 DIAGNOSIS — K922 Gastrointestinal hemorrhage, unspecified: Secondary | ICD-10-CM | POA: Diagnosis not present

## 2020-09-12 DIAGNOSIS — R627 Adult failure to thrive: Secondary | ICD-10-CM | POA: Diagnosis present

## 2020-09-12 DIAGNOSIS — Z789 Other specified health status: Secondary | ICD-10-CM

## 2020-09-12 DIAGNOSIS — H9190 Unspecified hearing loss, unspecified ear: Secondary | ICD-10-CM | POA: Diagnosis present

## 2020-09-12 DIAGNOSIS — R109 Unspecified abdominal pain: Secondary | ICD-10-CM

## 2020-09-12 DIAGNOSIS — Z8041 Family history of malignant neoplasm of ovary: Secondary | ICD-10-CM

## 2020-09-12 DIAGNOSIS — J9601 Acute respiratory failure with hypoxia: Secondary | ICD-10-CM | POA: Diagnosis present

## 2020-09-12 DIAGNOSIS — E8809 Other disorders of plasma-protein metabolism, not elsewhere classified: Secondary | ICD-10-CM | POA: Diagnosis present

## 2020-09-12 DIAGNOSIS — D696 Thrombocytopenia, unspecified: Secondary | ICD-10-CM | POA: Diagnosis not present

## 2020-09-12 DIAGNOSIS — M7989 Other specified soft tissue disorders: Secondary | ICD-10-CM

## 2020-09-12 DIAGNOSIS — Z8619 Personal history of other infectious and parasitic diseases: Secondary | ICD-10-CM

## 2020-09-12 LAB — GASTROINTESTINAL PANEL BY PCR, STOOL (REPLACES STOOL CULTURE)

## 2020-09-12 LAB — CBC WITH DIFFERENTIAL/PLATELET
Abs Immature Granulocytes: 0 10*3/uL (ref 0.00–0.07)
Basophils Absolute: 0 10*3/uL (ref 0.0–0.1)
Basophils Relative: 0 %
Eosinophils Absolute: 0 10*3/uL (ref 0.0–0.5)
Eosinophils Relative: 0 %
HCT: 38.8 % — ABNORMAL LOW (ref 39.0–52.0)
Hemoglobin: 12.3 g/dL — ABNORMAL LOW (ref 13.0–17.0)
Lymphocytes Relative: 5 %
Lymphs Abs: 1.8 10*3/uL (ref 0.7–4.0)
MCH: 28.1 pg (ref 26.0–34.0)
MCHC: 31.7 g/dL (ref 30.0–36.0)
MCV: 88.6 fL (ref 80.0–100.0)
Monocytes Absolute: 0.4 10*3/uL (ref 0.1–1.0)
Monocytes Relative: 1 %
Neutro Abs: 33.8 10*3/uL — ABNORMAL HIGH (ref 1.7–7.7)
Neutrophils Relative %: 94 %
Platelets: 185 10*3/uL (ref 150–400)
RBC: 4.38 MIL/uL (ref 4.22–5.81)
RDW: 18.1 % — ABNORMAL HIGH (ref 11.5–15.5)
Smear Review: NORMAL
WBC: 36 10*3/uL — ABNORMAL HIGH (ref 4.0–10.5)
nRBC: 0 % (ref 0.0–0.2)

## 2020-09-12 LAB — URINALYSIS, COMPLETE (UACMP) WITH MICROSCOPIC
Bacteria, UA: NONE SEEN
Bilirubin Urine: NEGATIVE
Glucose, UA: NEGATIVE mg/dL
Hgb urine dipstick: NEGATIVE
Ketones, ur: NEGATIVE mg/dL
Leukocytes,Ua: NEGATIVE
Nitrite: NEGATIVE
Protein, ur: NEGATIVE mg/dL
Specific Gravity, Urine: 1.021 (ref 1.005–1.030)
Squamous Epithelial / HPF: NONE SEEN (ref 0–5)
pH: 5 (ref 5.0–8.0)

## 2020-09-12 LAB — COMPREHENSIVE METABOLIC PANEL
ALT: 17 U/L (ref 0–44)
AST: 37 U/L (ref 15–41)
Albumin: 2 g/dL — ABNORMAL LOW (ref 3.5–5.0)
Alkaline Phosphatase: 223 U/L — ABNORMAL HIGH (ref 38–126)
Anion gap: 12 (ref 5–15)
BUN: 35 mg/dL — ABNORMAL HIGH (ref 8–23)
CO2: 20 mmol/L — ABNORMAL LOW (ref 22–32)
Calcium: 7.6 mg/dL — ABNORMAL LOW (ref 8.9–10.3)
Chloride: 94 mmol/L — ABNORMAL LOW (ref 98–111)
Creatinine, Ser: 1.48 mg/dL — ABNORMAL HIGH (ref 0.61–1.24)
GFR, Estimated: 44 mL/min — ABNORMAL LOW (ref >60)
Glucose, Bld: 198 mg/dL — ABNORMAL HIGH (ref 70–99)
Potassium: 5.6 mmol/L — ABNORMAL HIGH (ref 3.5–5.1)
Sodium: 126 mmol/L — ABNORMAL LOW (ref 135–145)
Total Bilirubin: 1.1 mg/dL (ref 0.3–1.2)
Total Protein: 5.5 g/dL — ABNORMAL LOW (ref 6.5–8.1)

## 2020-09-12 LAB — C DIFFICILE QUICK SCREEN W PCR REFLEX
C Diff antigen: POSITIVE — AB
C Diff interpretation: DETECTED
C Diff toxin: POSITIVE — AB

## 2020-09-12 LAB — LACTIC ACID, PLASMA
Lactic Acid, Venous: 4.1 mmol/L (ref 0.5–1.9)
Lactic Acid, Venous: 2.8 mmol/L (ref 0.5–1.9)

## 2020-09-12 LAB — RESP PANEL BY RT-PCR (FLU A&B, COVID) ARPGX2
Influenza A by PCR: NEGATIVE
Influenza B by PCR: NEGATIVE
SARS Coronavirus 2 by RT PCR: NEGATIVE

## 2020-09-12 LAB — PROTIME-INR
INR: 1 (ref 0.8–1.2)
Prothrombin Time: 13.1 seconds (ref 11.4–15.2)

## 2020-09-12 MED ORDER — HEPARIN SODIUM (PORCINE) 5000 UNIT/ML IJ SOLN
5000.0000 [IU] | Freq: Three times a day (TID) | INTRAMUSCULAR | Status: DC
  Administered 2020-09-12 – 2020-09-14 (×5): 5000 [IU] via SUBCUTANEOUS
  Filled 2020-09-12 (×5): qty 1

## 2020-09-12 MED ORDER — SODIUM CHLORIDE 0.9 % IV SOLN: INTRAVENOUS | Status: AC

## 2020-09-12 MED ORDER — VANCOMYCIN 50 MG/ML ORAL SOLUTION: 125.0000 mg | ORAL | Status: DC

## 2020-09-12 MED ORDER — METRONIDAZOLE IN NACL 5-0.79 MG/ML-% IV SOLN
500.0000 mg | Freq: Once | INTRAVENOUS | Status: AC
  Administered 2020-09-12: 500 mg via INTRAVENOUS
  Filled 2020-09-12: qty 100

## 2020-09-12 MED ORDER — ACETAMINOPHEN 650 MG RE SUPP: 650.0000 mg | Freq: Four times a day (QID) | RECTAL | Status: DC | PRN

## 2020-09-12 MED ORDER — ACETAMINOPHEN 325 MG PO TABS: 650.0000 mg | ORAL_TABLET | Freq: Four times a day (QID) | ORAL | Status: DC | PRN

## 2020-09-12 MED ORDER — ATORVASTATIN CALCIUM 20 MG PO TABS
10.0000 mg | ORAL_TABLET | Freq: Every day | ORAL | Status: DC
  Administered 2020-09-13 – 2020-09-23 (×11): 10 mg via ORAL
  Filled 2020-09-12 (×11): qty 1

## 2020-09-12 MED ORDER — SODIUM CHLORIDE 0.9 % IV BOLUS (SEPSIS)
500.0000 mL | Freq: Once | INTRAVENOUS | Status: AC
  Administered 2020-09-12: 500 mL via INTRAVENOUS

## 2020-09-12 MED ORDER — VANCOMYCIN 50 MG/ML ORAL SOLUTION
125.0000 mg | Freq: Four times a day (QID) | ORAL | Status: DC
  Administered 2020-09-12: 125 mg via ORAL
  Filled 2020-09-12 (×3): qty 2.5

## 2020-09-12 MED ORDER — ONDANSETRON HCL 4 MG/2ML IJ SOLN: 4.0000 mg | Freq: Four times a day (QID) | INTRAMUSCULAR | Status: DC | PRN

## 2020-09-12 MED ORDER — VANCOMYCIN 50 MG/ML ORAL SOLUTION: 125.0000 mg | Freq: Every day | ORAL | Status: DC

## 2020-09-12 MED ORDER — METRONIDAZOLE IN NACL 5-0.79 MG/ML-% IV SOLN
500.0000 mg | Freq: Three times a day (TID) | INTRAVENOUS | Status: DC
  Administered 2020-09-13 – 2020-09-17 (×14): 500 mg via INTRAVENOUS
  Filled 2020-09-12 (×16): qty 100

## 2020-09-12 MED ORDER — SODIUM CHLORIDE 0.9 % IV SOLN
2.0000 g | Freq: Once | INTRAVENOUS | Status: AC
  Administered 2020-09-12: 2 g via INTRAVENOUS
  Filled 2020-09-12: qty 2

## 2020-09-12 MED ORDER — ASPIRIN 81 MG PO CHEW
81.0000 mg | CHEWABLE_TABLET | Freq: Every day | ORAL | Status: DC
  Administered 2020-09-13 – 2020-09-14 (×2): 81 mg via ORAL
  Filled 2020-09-12 (×3): qty 1

## 2020-09-12 MED ORDER — IOHEXOL 350 MG/ML SOLN
60.0000 mL | Freq: Once | INTRAVENOUS | Status: AC | PRN
  Administered 2020-09-12: 60 mL via INTRAVENOUS

## 2020-09-12 MED ORDER — ENSURE ENLIVE PO LIQD
237.0000 mL | Freq: Two times a day (BID) | ORAL | Status: DC
  Administered 2020-09-13 – 2020-09-23 (×21): 237 mL via ORAL

## 2020-09-12 MED ORDER — ONDANSETRON HCL 4 MG PO TABS: 4.0000 mg | ORAL_TABLET | Freq: Four times a day (QID) | ORAL | Status: DC | PRN

## 2020-09-12 MED ORDER — SODIUM CHLORIDE 0.9 % IV BOLUS (SEPSIS)
1000.0000 mL | Freq: Once | INTRAVENOUS | Status: AC
  Administered 2020-09-12: 1000 mL via INTRAVENOUS

## 2020-09-12 MED ORDER — VANCOMYCIN 50 MG/ML ORAL SOLUTION: 125.0000 mg | Freq: Two times a day (BID) | ORAL | Status: DC

## 2020-09-12 NOTE — H&P (Signed)
History and Physical    Dustin Howell FOY:[id number] DOB: 07/13/1925 DOA: 09/12/2020  PCP: [name], MD  Patient coming from: Home  I have personally briefly reviewed patient's old medical records in [place]  Chief Complaint: Diarrhea HPI: Dustin Howell is a 87 y.o. male with medical history significant for CAD s/p DES, lymphoproliferative disorder, recent lower GI bleed, recent C. difficile infection, HOH who presents to the ED for evaluation of frequent diarrhea.  Patient recently admitted December 2021 for lower GI bleeding at which time flexible sigmoidoscopy showed AVM and diverticula.  He was admitted again 07/20/2020-07/26/2020 for right hip fracture and underwent IM nail on 07/21/2020.  He was discharged to rehab facility.  Prior to discharge from rehab facility he was found to have C. difficile diarrhea and was started on oral vancomycin and finished a 2-week course with initial improvement.  He had recurrent diarrhea and initially started back on vancomycin but then follow-up C. difficile test was negative so antibiotics were stopped.  He had continued persistent diarrhea and he was seen by infectious disease, Dr. [name] on 09/10/2020.  He was again restarted on oral vancomycin.  Labs were obtained and significant for leukocytosis with WBC 40.5, mild hyponatremia and hyperkalemia.  He was subsequently advised to present to the ED for further evaluation and management.  Patient reports continue frequent watery diarrhea.  He has not had any nausea, vomiting, or significant abdominal pain.  ED Course:  Initial vitals showed BP 129/63, pulse 109-115, RR 18, temp 97.9 F.  Per ED documentation SPO2 initially 72% on room air, improved to 96% on 2 L of home O2 via [place] and has subsequently been weaned off supplemental O2.  Labs significant for WBC 36.0, hemoglobin 12.3, platelets 185,000, sodium 126, potassium 5.6, bicarb 20, BUN 35, creatinine 1.48, serum glucose 198, albumin 2.0,  corrected calcium 9.2, AST 37, ALT 17, alk phos 223, total bilirubin 1.1, lactic acid 4.1 > 2.8, INR 1.0.  Blood cultures obtained and pending.  SARS-CoV-2 PCR negative.  Influenza A/B PCR is negative.  C. difficile and GI pathogen panels ordered and pending.  Portable chest x-ray shows trace right and small left pleural effusion.  CTA chest PE study showed small nonocclusive thrombus within the left lower lobe distal segmental/subsegmental vessels.  Small bilateral pleural effusions with dependent atelectasis noted. 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 Stable mild aneurysmal dilatation of the proximal infrarenal abdominal aorta up to 3.1 cm, penetrating aortic ulcer or ulcerated plaque at this level measuring approximately 1 cm. 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  CT abdomen/pelvis with contrast showed changes consistent with pancolitis without perforation or abscess.  Small free fluid within the abdomen and pelvis and generalized subcutaneous edema noted.  Peripheral slightly wedge-shaped hypodensities within the posterior spleen seen, indeterminate for small infarct.  Patient was given 1.5 L normal saline, IV cefepime and Flagyl.  The hospitalist service was consulted to admit for further evaluation and management.  Review of Systems: All systems reviewed and are negative except as documented in history of present illness above.   Past Medical History:  Diagnosis Date  . GERD (gastroesophageal reflux disease)   . GI bleed 04/2016   presumed diverticular source, treated with embolization by IR  . HOH (hard of hearing)    right sided hearing aid  . Iron deficiency anemia 03/2018  . Lymphoproliferative disorder ([place]) 03/2018   heme: Dr Janese Banks in [place].  low grade monocytosis,  thrombocytopenia: likely low-grade lymphproliferative disorder.      Past Surgical History:  Procedure Laterality Date  . APPENDECTOMY    . bilat. rotator cuff  issues Bilateral    "stiff" shoulders  . CORONARY STENT INTERVENTION N/A 04/21/2017   Procedure: CORONARY STENT INTERVENTION;  Surgeon: Isaias Cowman, MD;  Location: [place] [facility];  Service: Cardiovascular;  Laterality: N/A;  . FLEXIBLE SIGMOIDOSCOPY N/A 06/21/2020   Procedure: FLEXIBLE SIGMOIDOSCOPY;  Surgeon: Ladene Artist, MD;  Location: [facility] ENDOSCOPY;  Service: Endoscopy;  Laterality: N/A;  . HOT HEMOSTASIS N/A 06/21/2020   Procedure: HOT HEMOSTASIS (ARGON PLASMA COAGULATION/BICAP);  Surgeon: Ladene Artist, MD;  Location: [facility] ENDOSCOPY;  Service: Endoscopy;  Laterality: N/A;  . INTRAMEDULLARY (IM) NAIL INTERTROCHANTERIC Right 07/21/2020   Procedure: INTRAMEDULLARY (IM) NAIL INTERTROCHANTRIC;  Surgeon: Earnestine Leys, MD;  Location: [facility] ORS;  Service: Orthopedics;  Laterality: Right;  . IR ANGIOGRAM SELECTIVE EACH ADDITIONAL VESSEL  06/20/2020  . IR ANGIOGRAM VISCERAL SELECTIVE  06/20/2020  . IR ANGIOGRAM VISCERAL SELECTIVE  06/20/2020  . IR GENERIC HISTORICAL  05/12/2016   IR EMBO ART  VEN HEMORR LYMPH EXTRAV  INC GUIDE ROADMAPPING 05/12/2016 Corrie Mckusick, DO [facility]  . IR GENERIC HISTORICAL  05/12/2016   IR ANGIOGRAM VISCERAL SELECTIVE 05/12/2016 Corrie Mckusick, DO [facility]  . IR GENERIC HISTORICAL  05/12/2016   IR US GUIDE VASC ACCESS RIGHT 05/12/2016 Corrie Mckusick, DO [facility]  . IR GENERIC HISTORICAL  05/12/2016   IR ANGIOGRAM SELECTIVE EACH ADDITIONAL VESSEL 05/12/2016 Corrie Mckusick, DO [facility]  . IR GENERIC HISTORICAL  05/12/2016   IR ANGIOGRAM SELECTIVE EACH ADDITIONAL VESSEL 05/12/2016 Corrie Mckusick, DO [facility]  . IR GENERIC HISTORICAL  05/12/2016   IR ANGIOGRAM FOLLOW UP STUDY 05/12/2016 Corrie Mckusick, DO [facility]  . IR GENERIC HISTORICAL  05/12/2016   IR ANGIOGRAM SELECTIVE EACH ADDITIONAL VESSEL 05/12/2016 Corrie Mckusick, DO [facility]  . IR US GUIDE VASC ACCESS RIGHT  06/20/2020  . LEFT HEART CATH AND CORONARY ANGIOGRAPHY Left  04/21/2017   Procedure: LEFT HEART CATH AND CORONARY ANGIOGRAPHY;  Surgeon: Isaias Cowman, MD;  Location: [place] [facility];  Service: Cardiovascular;  Laterality: Left;    Social History:  reports that he quit smoking about 65 years ago. His smoking use included cigarettes. He smoked 0.50 packs per day. His smokeless tobacco use includes snuff. He reports current alcohol use. He reports that he does not use drugs.  No Known Allergies  Family History  Problem Relation Age of Onset  . Diabetes Mother   . Stroke Father   . Ovarian cancer Sister      Prior to Admission medications   Medication Sig Start Date End Date Taking? Authorizing Provider  acetaminophen (TYLENOL) 325 MG tablet Take 1-2 tablets (325-650 mg total) by mouth every 6 (six) hours as needed for mild pain (pain score 1-3 or temp > 100.5). 07/26/20   Sharen Hones, MD  amLODipine (NORVASC) 5 MG tablet Take 1 tablet (5 mg total) by mouth daily. 05/20/16   Bonnielee Haff, MD  aspirin 81 MG chewable tablet Chew 1 tablet (81 mg total) by mouth daily. 07/05/20   Marianna Payment, MD  atorvastatin (LIPITOR) 10 MG tablet Take 10 mg by mouth daily.    [provider]  bisacodyl (DULCOLAX) 10 MG suppository Place 1 suppository (10 mg total) rectally daily as needed for moderate constipation. 07/26/20   Sharen Hones, MD  enoxaparin (LOVENOX) 30 MG/0.3ML injection Inject 0.3 mLs (30 mg  total) into the skin daily for 14 days. 07/27/20 08/10/20  Sharen Hones, MD  feeding supplement (ENSURE ENLIVE / ENSURE PLUS) LIQD Take 237 mLs by mouth 2 (two) times daily between meals. 07/26/20   Sharen Hones, MD  ferrous sulfate 325 (65 FE) MG tablet Take 1 tablet (325 mg total) by mouth 2 (two) times daily with a meal. 07/26/20   Sharen Hones, MD  HYDROcodone-acetaminophen (NORCO/VICODIN) 5-325 MG tablet Take 1 tablet by mouth every 4 (four) hours as needed for moderate pain. 07/26/20   Sharen Hones, MD  nitroGLYCERIN (NITROSTAT) 0.4 MG SL  tablet Place 0.4 mg under the tongue every 5 (five) minutes as needed for chest pain.    [provider]    Physical Exam: Vitals:   09/12/20 1538 09/12/20 1639 09/12/20 1757 09/12/20 1837  BP: 125/74 123/78 120/74 118/70  Pulse: (!) 115 (!) 110 92 90  Resp: 13 12 12 12   Temp:      TempSrc:      SpO2: 96% 98% 99% 97%  Weight:      Height:       Constitutional: Thin elderly man resting supine in bed, NAD, calm, comfortable Eyes: PERRL, lids and conjunctivae normal ENMT: Mucous membranes are dry. Posterior pharynx clear of any exudate or lesions.edentulous. Neck: normal, supple, no masses. Respiratory: Bibasilar crackles.  Normal respiratory effort. No accessory muscle use.  Cardiovascular: Tachycardic.  +1 bilateral lower extremity edema. 2+ pedal pulses. Abdomen: no tenderness, no masses palpated.  Bowel sounds positive.  Musculoskeletal: no clubbing / cyanosis. No joint deformity upper and lower extremities. Good ROM, no contractures. Normal muscle tone.  Skin: no rashes, lesions, ulcers. No induration Neurologic: CN 2-12 grossly intact. Sensation intact. Strength 5/5 in all 4.  Psychiatric: Normal judgment and insight. Alert and oriented x 3. Normal mood.   Labs on Admission: I have personally reviewed following labs and imaging studies  CBC: Recent Labs  Lab 09/12/20 1521  WBC 36.0*  NEUTROABS 33.8*  HGB 12.3*  HCT 38.8*  MCV 88.6  PLT 476   Basic Metabolic Panel: Recent Labs  Lab 09/12/20 1521  NA 126*  K 5.6*  CL 94*  CO2 20*  GLUCOSE 198*  BUN 35*  CREATININE 1.48*  CALCIUM 7.6*   GFR: Estimated Creatinine Clearance: 19.6 mL/min (A) (by C-G formula based on SCr of 1.48 mg/dL (H)). Liver Function Tests: Recent Labs  Lab 09/12/20 1521  AST 37  ALT 17  ALKPHOS 223*  BILITOT 1.1  PROT 5.5*  ALBUMIN 2.0*   No results for input(s): LIPASE, AMYLASE in the last 168 hours. No results for input(s): AMMONIA in the last 168 hours. Coagulation  Profile: Recent Labs  Lab 09/12/20 1521  INR 1.0   Cardiac Enzymes: No results for input(s): CKTOTAL, CKMB, CKMBINDEX, TROPONINI in the last 168 hours. BNP (last 3 results) No results for input(s): PROBNP in the last 8760 hours. HbA1C: No results for input(s): HGBA1C in the last 72 hours. CBG: No results for input(s): GLUCAP in the last 168 hours. Lipid Profile: No results for input(s): CHOL, HDL, LDLCALC, TRIG, CHOLHDL, LDLDIRECT in the last 72 hours. Thyroid Function Tests: No results for input(s): TSH, T4TOTAL, FREET4, T3FREE, THYROIDAB in the last 72 hours. Anemia Panel: No results for input(s): VITAMINB12, FOLATE, FERRITIN, TIBC, IRON, RETICCTPCT in the last 72 hours. Urine analysis:    Component Value Date/Time   COLORURINE YELLOW (A) 07/20/2020 1838   APPEARANCEUR CLEAR (A) 07/20/2020 1838   LABSPEC 1.017 07/20/2020  [place] 5.0 07/20/2020 1838   GLUCOSEU NEGATIVE 07/20/2020 1838   HGBUR NEGATIVE 07/20/2020 1838   BILIRUBINUR NEGATIVE 07/20/2020 1838   Benjamin Stain NEGATIVE 07/20/2020 1838   PROTEINUR NEGATIVE 07/20/2020 1838   NITRITE NEGATIVE 07/20/2020 1838   LEUKOCYTESUR NEGATIVE 07/20/2020 1838    Radiological Exams on Admission: CT Angio Chest PE W and/or Wo Contrast  Result Date: 09/12/2020 CLINICAL DATA:  Shortness of breath EXAM: CT ANGIOGRAPHY CHEST WITH CONTRAST TECHNIQUE: Multidetector CT imaging of the chest was performed using the standard protocol during bolus administration of intravenous contrast. Multiplanar CT image reconstructions and MIPs were obtained to evaluate the vascular anatomy. CONTRAST:  18m OMNIPAQUE IOHEXOL 350 MG/ML SOLN COMPARISON:  Chest x-ray 09/12/2020, CT chest 11/26/2008, CT angiography 06/21/2019 FINDINGS: Cardiovascular: Satisfactory opacification of the pulmonary arteries to the segmental level. Small nonocclusive thrombus within left lower lobe distal segmental/subsegmental vessel. No other discrete filling defects are  visualized. Moderate aortic atherosclerosis without dissection. Coronary vascular calcification. Normal cardiac size. Trace pericardial effusion. 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 Stable mild aneurysmal dilatation of the proximal infrarenal abdominal aorta up to 3.1 cm, penetrating aortic ulcer or ulcerated plaque at this level measuring approximately 1 cm. 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 Mediastinum/Nodes: Midline trachea. No thyroid mass. No suspicious adenopathy. Esophagus within normal limits. Lungs/Pleura: Small bilateral pleural effusions with dependent atelectasis. Mild subpleural fibrosis. Scarring and fibrosis at the apices. Upper Abdomen: No acute abnormality. Small gallstones. Small amount of free fluid within the upper abdomen. Musculoskeletal: Chronic sternal fracture Review of the MIP images confirms the above findings. IMPRESSION: 1. Small nonocclusive thrombus within the left lower lobe distal segmental/subsegmental vessels. 2. Small bilateral pleural effusions with dependent atelectasis. 3. 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 Stable mild aneurysmal dilatation of the proximal infrarenal abdominal aorta up to 3.1 cm, penetrating aortic ulcer or ulcerated plaque at this level measuring approximately 1 cm. 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 Recommend follow-up ultrasound every 3 years. This recommendation follows ACR consensus guidelines: White Paper of the ACR Incidental Findings Committee II on Vascular Findings. J Am Coll Radiol 2013; 10:789-794. 4. Gallstones. 5. Small amount of free fluid within the upper abdomen. 6. Aortic atherosclerosis. Critical Value/emergent results were called by telephone at the time of interpretation on 09/12/2020 at 5:36 pm to provider PHILLIP STAFFORD , who verbally acknowledged these results. Aortic Atherosclerosis (ICD10-I70.0). Electronically Signed    By: KDonavan FoilM.D.   On: 09/12/2020 17:37   CT ABDOMEN PELVIS W CONTRAST  Result Date: 09/12/2020 CLINICAL DATA:  Abdominal distension EXAM: CT ABDOMEN AND PELVIS WITH CONTRAST TECHNIQUE: Multidetector CT imaging of the abdomen and pelvis was performed using the standard protocol following bolus administration of intravenous contrast. CONTRAST:  677mOMNIPAQUE IOHEXOL 350 MG/ML SOLN COMPARISON:  CT 06/20/2020 FINDINGS: Lower chest: Lung bases demonstrate small bilateral pleural effusions and dependent atelectasis. Coronary vascular calcification. Normal cardiac size. Hepatobiliary: Small gallstones. No biliary dilatation. No focal hepatic abnormality Pancreas: Unremarkable. No pancreatic ductal dilatation or surrounding inflammatory changes. Spleen: Peripheral slightly wedge-shaped hypodensities within the posterior spleen are indeterminate for small infarcts. Adrenals/Urinary Tract: Adrenal glands are normal. 6.6 cm cyst exophytic to the upper left kidney. Small cyst lower pole left kidney. No hydronephrosis. The bladder is normal Stomach/Bowel: The stomach is nonenlarged. No dilated small bowel. Marked diffuse wall thickening of the entire colon with mucosal enhancement. Grossly negative appendix. Diverticular disease  of the sigmoid colon. Vascular/Lymphatic: Extensive aortic atherosclerosis. Penetrating aortic ulcers or ulcerated plaque at the supra and infrarenal abdominal aorta described on chest CT. Mild aneurysmal dilatation of the infrarenal abdominal aorta measuring up to 3.1 cm. 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 No suspicious nodes Reproductive: Prostate is unremarkable. Other: No free air. Small quantity of free fluid within the abdomen and pelvis. Generalized subcutaneous edema consistent with anasarca Musculoskeletal: Status post right femoral intramedullary rodding for comminuted intertrochanteric fracture. IMPRESSION: 1. Marked diffuse wall thickening of the entire colon with  mucosal enhancement, consistent with pancolitis of infectious, inflammatory, or ischemic etiology. This likely corresponds to the history of C difficile colitis. There is no perforation or abscess. 2. Small quantity of free fluid within the abdomen and pelvis. Generalized subcutaneous edema consistent with anasarca. 3. Peripheral slightly wedge-shaped hypodensities within the posterior spleen, indeterminate for small infarcts. 4. Extensive aortic atherosclerosis with penetrating aortic ulcers or ulcerated plaque at the supra and infrarenal abdominal aorta without significant change since 06/20/2020. Mild aneurysmal dilatation of the infrarenal abdominal aorta measuring up to 3.1 cm. Recommend follow-up ultrasound every 3 years. This recommendation follows ACR consensus guidelines: White Paper of the ACR Incidental Findings Committee II on Vascular Findings. J Am Coll Radiol 2013; 10:789-794. 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 5. Small bilateral pleural effusions and dependent atelectasis. 6. Cholelithiasis. 7. Status post right femoral intramedullary rodding for comminuted intertrochanteric fracture. Critical Value/emergent results were called by telephone at the time of interpretation on 09/12/2020 at 5:36 pm to provider PHILLIP STAFFORD , who verbally acknowledged these results. Aortic Atherosclerosis (ICD10-I70.0). Electronically Signed   By: Donavan Foil M.D.   On: 09/12/2020 17:36   DG Chest Portable 1 View  Result Date: 09/12/2020 CLINICAL DATA:  Hypoxia EXAM: PORTABLE CHEST 1 VIEW COMPARISON:  07/20/2020 FINDINGS: Trace right and small left pleural effusion. Consolidation at the left lung base. Normal cardiomediastinal silhouette with aortic atherosclerosis. No pneumothorax. IMPRESSION: Trace right and small left pleural effusion with left basilar atelectasis or pneumonia. Electronically Signed   By: Donavan Foil M.D.   On: 09/12/2020 16:05    EKG: Personally reviewed. Sinus tachycardia,  Rate 111, motion artifact.  Rate is faster when compared to prior.  Assessment/Plan Principal Problem:   Severe sepsis (HCC) Active Problems:   CAD (coronary artery disease)   Recurrent colitis due to Clostridium difficile   AKI (acute kidney injury) ([place])   Hyponatremia   Hyperkalemia  Dustin Howell is a 87 y.o. male with medical history significant for CAD s/p DES, lymphoproliferative disorder, recent lower GI bleed, recent C. difficile infection, HOH who is admitted with severe sepsis due to recurrent C. difficile colitis.  Severe sepsis due to recurrent C. difficile colitis: Patient presenting with leukocytosis, tachycardia, lactic acidosis, persistent diarrhea in setting of recent C. difficile infection and CT imaging consistent with colitis all suggestive of recurrent C. diff colitis as cause of severe sepsis. He has been on and off oral vancomycin as an outpatient. -Continue oral vancomycin 125 mg four times daily, ordered as 12-week taper -Continue IV Flagyl 500 mg every 8 hours for now -C. difficile and GI pathogen panels pending -Blood cultures pending -Continue IV fluid hydration overnight  Acute respiratory failure with hypoxia: Likely multifactorial due to small nonocclusive thrombus within left lower lobe distal segmental/subsegmental vessels seen on CTA, small bilateral pleural effusions, trace pericardial effusion. Now saturating well on room air after receiving IV fluids. -Supplemental oxygen as  needed -Hold off on full dose anticoagulation given recent GI bleed December 2021  AKI: Prerenal due to hypovolemia from GI losses. Continue IV fluid hydration as above and repeat labs in AM.  Hyponatremia: Suspect largely related to hypovolemia from GI losses. Also has peripheral edema from hypoalbuminemia. Continue gentle IV fluid hydration overnight and recheck labs in a.m.  Hyperkalemia: Continue IV fluids and recheck with labs in a.m.  Hypoalbuminemia with peripheral  edema: Likely protein calorie malnutrition from poor absorption due to C. difficile colitis/diarrhea. Continue Ensure.  CAD s/p DES: Chronic and stable. Denies any chest pain. Continue aspirin and statin.  History of lower GI bleed: Recent admit December 2021 for lower GI bleed. Found to have AVMs and diverticula on flexible sigmoidoscopy. Denies any recurrent obvious bleeding. Continue to monitor.  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  Goals of care: Discussed at length with patient on admission. He says he has had a significant decline in quality of life for the last few months. He is okay with current plan of care as above, however if he does not have significant improvement after the first 48 hours or decompensates then we should focus on comfort. He does not want any escalation of care or aggressive interventions. His goal is to return home with family as soon as possible. CODE STATUS is DNR, confirmed on admission.  DVT prophylaxis: Subcutaneous heparin Code Status: DNR Family Communication: Discussed with patient Disposition Plan: From home and likely discharge to home pending symptomatic improvement Consults called: None Level of care: Med-Surg Admission status:  Status is: Inpatient  Remains inpatient appropriate because:Persistent severe electrolyte disturbances, IV treatments appropriate due to intensity of illness or inability to take PO and Inpatient level of care appropriate due to severity of illness   Dispo: The patient is from: Home              Anticipated d/c is to: Home              Patient currently is not medically stable to d/c.   Zada Finders MD [facility]  If 7PM-7AM, please contact night-coverage [email or web address]  09/12/2020, 8:27 PM

## 2020-09-12 NOTE — Progress Notes (Signed)
PHARMACY -  BRIEF ANTIBIOTIC NOTE   Pharmacy has received consult(s) for cefepime from an ED provider.  The patient's profile has been reviewed for ht/wt/allergies/indication/available labs.    One time order(s) placed for cefepime 2 grams IV x 1  Further antibiotics/pharmacy consults should be ordered by admitting physician if indicated.                       Thank you, Dallie Piles 09/12/2020  3:56 PM

## 2020-09-12 NOTE — ED Provider Notes (Addendum)
[facility] Emergency Department Provider Note  ____________________________________________  Time seen: Approximately 3:59 PM  I have reviewed the triage vital signs and the nursing notes.   HISTORY  Chief Complaint Diarrhea and Shortness of Breath    HPI Dustin Howell is a 87 y.o. male sent to the ED today due to abnormal labs including elevated white blood cell count of 40,000, sodium level of 128.  Patient has been having diarrhea for the past month or so, currently on his second course of oral vancomycin.  He is having persistent generalized weakness, no vomiting.  Able to take fluids by mouth.  No fever.  Symptoms are waxing and waning, aggravated by eating, improved by Imodium.  No radiating pain.  Today he also reports feeling short of breath.  He was found to have an oxygen saturation of 70% on room air, started on nasal cannula oxygen.  Denies cough or chest pain.      Recent medical events, as documented by Dr. [name] in his clinic note On Jan 8 he fell and had Hip fx, had repair and then went to rehab. Prior to that he lived alone, still drove.  He developed bad diarrhea a few days before dc from rehab. C diff + and started on vancomycin and finished 2 week course with good improvement. However within 5 days sxs recurred. He came for fu with Dr L and started back on vancomycin but then C diff test fu neg so stopped. He is now having diarrhea every 2 hours, small amt. Wears depends. No blood, mucus now but did have a little when stools were firmer a few weeks ago. He does have hx prior GIB. Most recently in Dec 2021- admitted - CTA showing lower GI bleed. IR was consulted for angiography without any evidence of bleed. GI subsequently performed a flexible sigmoidoscopy showing AVM and diverticula with evidence of old bleed, and treated at that time. Bleeding stopped spontaneously and he had to have 2 units of blood.        Past Medical History:   Diagnosis Date  . GERD (gastroesophageal reflux disease)   . GI bleed 04/2016   presumed diverticular source, treated with embolization by IR  . HOH (hard of hearing)    right sided hearing aid  . Iron deficiency anemia 03/2018  . Lymphoproliferative disorder ([place]) 03/2018   heme: Dr Janese Banks in [place].  low grade monocytosis, thrombocytopenia: likely low-grade lymphproliferative disorder.       Patient Active Problem List   Diagnosis Date Noted  . Severe sepsis ([place]) 09/12/2020  . Recurrent colitis due to Clostridium difficile 09/12/2020  . AKI (acute kidney injury) ([place]) 09/12/2020  . Hyponatremia 09/12/2020  . Hyperkalemia 09/12/2020  . Protein-calorie malnutrition, severe 07/22/2020  . Acute metabolic encephalopathy   . Iron deficiency anemia due to chronic blood loss   . Lymphoproliferative disorder ([place])   . Thrombocytopenia ([place])   . Essential hypertension   . Hyperlipidemia   . Scalp laceration   . Stage 2 chronic kidney disease   . Closed fracture of right hip requiring operative repair ([place]) 07/20/2020  . Abnormal CT scan, colon   . Lower GI bleed 06/20/2020  . CAD (coronary artery disease) 04/27/2017  . 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 Chest pain on exertion 04/21/2017  . Abnormal CT of the abdomen   . Acute encephalopathy   . Hypokalemia   . Ileus ([place])   . Abdominal pain   .  Acute lower GI bleeding 05/11/2016  . Gastrointestinal hemorrhage 05/11/2016  . Acute blood loss anemia   . Arterial hypotension   . Acute pain of left knee   . Slurred speech      Past Surgical History:  Procedure Laterality Date  . APPENDECTOMY    . bilat. rotator cuff issues Bilateral    "stiff" shoulders  . CORONARY STENT INTERVENTION N/A 04/21/2017   Procedure: CORONARY STENT INTERVENTION;  Surgeon: Isaias Cowman, MD;  Location: [facility];  Service: Cardiovascular;  Laterality: N/A;  . FLEXIBLE SIGMOIDOSCOPY N/A 06/21/2020    Procedure: FLEXIBLE SIGMOIDOSCOPY;  Surgeon: Ladene Artist, MD;  Location: [facility] ENDOSCOPY;  Service: Endoscopy;  Laterality: N/A;  . HOT HEMOSTASIS N/A 06/21/2020   Procedure: HOT HEMOSTASIS (ARGON PLASMA COAGULATION/BICAP);  Surgeon: Ladene Artist, MD;  Location[facility]COPY;  Service: Endoscopy;  Laterality: N/A;  . INTRAMEDULLARY (IM) NAIL INTERTROCHANTERIC Right 07/21/2020   Procedure: INTRAMEDULLARY (IM) NAIL INTERTROCHANTRIC;  Surgeon: Earnestine Leys, MD;  Location: [facility] ORS;  Service: Orthopedics;  Laterality: Right;  . IR ANGIOGRAM SELECTIVE EACH ADDITIONAL VESSEL  06/20/2020  . IR ANGIOGRAM VISCERAL SELECTIVE  06/20/2020  . IR ANGIOGRAM VISCERAL SELECTIVE  06/20/2020  . IR GENERIC HISTORICAL  05/12/2016   IR EMBO ART  VEN HEMORR LYMPH EXTRAV  INC GUIDE ROADMAPPING 05/12/2016 Corrie Mckusick, DO [facility]  . IR GENERIC HISTORICAL  05/12/2016   IR ANGIOGRAM VISCERAL SELECTIVE 05/12/2016 Corrie Mckusick, DO [facility]  . IR GENERIC HISTORICAL  05/12/2016   IR US GUIDE VASC ACCESS RIGHT 05/12/2016 Corrie Mckusick, DO [facility]  . IR GENERIC HISTORICAL  05/12/2016   IR ANGIOGRAM SELECTIVE EACH ADDITIONAL VESSEL 05/12/2016 Corrie Mckusick, DO [facility]  . IR GENERIC HISTORICAL  05/12/2016   IR ANGIOGRAM SELECTIVE EACH ADDITIONAL VESSEL 05/12/2016 Corrie Mckusick, DO [facility]  . IR GENERIC HISTORICAL  05/12/2016   IR ANGIOGRAM FOLLOW UP STUDY 05/12/2016 Corrie Mckusick, DO [facility]  . IR GENERIC HISTORICAL  05/12/2016   IR ANGIOGRAM SELECTIVE EACH ADDITIONAL VESSEL 05/12/2016 Corrie Mckusick, DO [facility]  . IR US GUIDE VASC ACCESS RIGHT  06/20/2020  . LEFT HEART CATH AND CORONARY ANGIOGRAPHY Left 04/21/2017   Procedure: LEFT HEART CATH AND CORONARY ANGIOGRAPHY;  Surgeon: Isaias Cowman, MD;  Location: [facility];  Service: Cardiovascular;  Laterality: Left;     Prior to Admission medications   Medication Sig Start Date End Date Taking? Authorizing Provider   acetaminophen (TYLENOL) 325 MG tablet Take 1-2 tablets (325-650 mg total) by mouth every 6 (six) hours as needed for mild pain (pain score 1-3 or temp > 100.5). 07/26/20   Sharen Hones, MD  amLODipine (NORVASC) 5 MG tablet Take 1 tablet (5 mg total) by mouth daily. 05/20/16   Bonnielee Haff, MD  aspirin 81 MG chewable tablet Chew 1 tablet (81 mg total) by mouth daily. 07/05/20   Marianna Payment, MD  atorvastatin (LIPITOR) 10 MG tablet Take 10 mg by mouth daily.    [provider]  bisacodyl (DULCOLAX) 10 MG suppository Place 1 suppository (10 mg total) rectally daily as needed for moderate constipation. 07/26/20   Sharen Hones, MD  enoxaparin (LOVENOX) 30 MG/0.3ML injection Inject 0.3 mLs (30 mg total) into the skin daily for 14 days. 07/27/20 08/10/20  Sharen Hones, MD  feeding supplement (ENSURE ENLIVE / ENSURE PLUS) LIQD Take 237 mLs by mouth 2 (two) times daily between meals. 07/26/20   Sharen Hones, MD  ferrous sulfate 325 (65  FE) MG tablet Take 1 tablet (325 mg total) by mouth 2 (two) times daily with a meal. 07/26/20   Sharen Hones, MD  HYDROcodone-acetaminophen (NORCO/VICODIN) 5-325 MG tablet Take 1 tablet by mouth every 4 (four) hours as needed for moderate pain. 07/26/20   Sharen Hones, MD  nitroGLYCERIN (NITROSTAT) 0.4 MG SL tablet Place 0.4 mg under the tongue every 5 (five) minutes as needed for chest pain.    [provider]     Allergies Patient has no known allergies.   Family History  Problem Relation Age of Onset  . Diabetes Mother   . Stroke Father   . Ovarian cancer Sister     Social History Social History   Tobacco Use  . Smoking status: Former Smoker    Packs/day: 0.50    Types: Cigarettes    Quit date: 1957    Years since quitting: 65.2  . Smokeless tobacco: Current User    Types: Snuff  Substance Use Topics  . Alcohol use: Yes    Comment: every evening with supper  . Drug use: No    Review of Systems  Constitutional:   No fever or chills.   ENT:   No sore throat. No rhinorrhea. Cardiovascular:   No chest pain or syncope. Respiratory:   Positive shortness of breath without cough. Gastrointestinal:   Negative for abdominal pain positive diarrhea.  No vomiting Musculoskeletal:   Negative for focal pain or swelling All other systems reviewed and are negative except as documented above in ROS and HPI.  ____________________________________________   PHYSICAL EXAM:  VITAL SIGNS: ED Triage Vitals  Enc Vitals Group     BP 09/12/20 1453 129/63     Pulse Rate 09/12/20 1453 (!) 109     Resp 09/12/20 1453 18     Temp 09/12/20 1453 97.9 F (36.6 C)     Temp Source 09/12/20 1453 Oral     SpO2 09/12/20 1453 100 %     Weight 09/12/20 1454 100 lb (45.4 kg)     Height 09/12/20 1454 5\' 5"  (1.651 m)     Head Circumference --      Peak Flow --      Pain Score 09/12/20 1502 0     Pain Loc --      Pain Edu? --      Excl. in [place]? --     Vital signs reviewed, nursing assessments reviewed.   Constitutional:   Alert and oriented.  Ill-appearing Eyes:   Conjunctivae are normal. EOMI. PERRL. ENT      Head:   Normocephalic and atraumatic.      Nose: Normal      Mouth/Throat:   Dry mucous membranes      Neck:   No meningismus. Full ROM. Hematological/Lymphatic/Immunilogical:   No cervical lymphadenopathy. Cardiovascular:   Tachycardia heart rate 115. Symmetric bilateral radial and DP pulses.  No murmurs. Cap refill less than 2 seconds. Respiratory:   Normal respiratory effort without tachypnea/retractions. Breath sounds are clear and equal bilaterally. No wheezes/rales/rhonchi. Gastrointestinal:   Soft with mild generalized tenderness.  Mildly distended.  Tympanic with percussion.. There is no CVA tenderness.  No rebound, rigidity, or guarding. Musculoskeletal:   Normal range of motion in all extremities. No joint effusions.  No lower extremity tenderness.  2+ pitting edema bilateral lower extremities.  Symmetric calf  circumference. Neurologic:   Normal speech and language.  Motor grossly intact. No acute focal neurologic deficits are appreciated.  Skin:  Skin is warm, dry and intact. No rash noted.  No petechiae, purpura, or bullae.  ____________________________________________    LABS (pertinent positives/negatives) (all labs ordered are listed, but only abnormal results are displayed) Labs Reviewed  COMPREHENSIVE METABOLIC PANEL - Abnormal; Notable for the following components:      Result Value   Sodium 126 (*)    Potassium 5.6 (*)    Chloride 94 (*)    CO2 20 (*)    Glucose, Bld 198 (*)    BUN 35 (*)    Creatinine, Ser 1.48 (*)    Calcium 7.6 (*)    Total Protein 5.5 (*)    Albumin 2.0 (*)    Alkaline Phosphatase 223 (*)    GFR, Estimated 44 (*)    All other components within normal limits  LACTIC ACID, PLASMA - Abnormal; Notable for the following components:   Lactic Acid, Venous 2.8 (*)    All other components within normal limits  LACTIC ACID, PLASMA - Abnormal; Notable for the following components:   Lactic Acid, Venous 4.1 (*)    All other components within normal limits  CBC WITH DIFFERENTIAL/PLATELET - Abnormal; Notable for the following components:   WBC 36.0 (*)    Hemoglobin 12.3 (*)    HCT 38.8 (*)    RDW 18.1 (*)    Neutro Abs 33.8 (*)    All other components within normal limits  RESP PANEL BY RT-PCR (FLU A&B, COVID) ARPGX2  CULTURE, BLOOD (ROUTINE X 2)  CULTURE, BLOOD (ROUTINE X 2)  GASTROINTESTINAL PANEL BY PCR, STOOL (REPLACES STOOL CULTURE)  C DIFFICILE QUICK SCREEN W PCR REFLEX  PROTIME-INR  URINALYSIS, COMPLETE (UACMP) WITH MICROSCOPIC   ____________________________________________   EKG  Interpreted by me Sinus tachycardia rate 111.  Normal axis, normal intervals.  Poor R wave progression.  Normal ST segments and T waves, no ischemic changes.  ____________________________________________    RADIOLOGY  CT Angio Chest PE W and/or Wo  Contrast  Result Date: 09/12/2020 CLINICAL DATA:  Shortness of breath EXAM: CT ANGIOGRAPHY CHEST WITH CONTRAST TECHNIQUE: Multidetector CT imaging of the chest was performed using the standard protocol during bolus administration of intravenous contrast. Multiplanar CT image reconstructions and MIPs were obtained to evaluate the vascular anatomy. CONTRAST:  64mL OMNIPAQUE IOHEXOL 350 MG/ML SOLN COMPARISON:  Chest x-ray 09/12/2020, CT chest 11/26/2008, CT angiography 06/21/2019 FINDINGS: Cardiovascular: Satisfactory opacification of the pulmonary arteries to the segmental level. Small nonocclusive thrombus within left lower lobe distal segmental/subsegmental vessel. No other discrete filling defects are visualized. Moderate aortic atherosclerosis without dissection. Coronary vascular calcification. Normal cardiac size. Trace pericardial effusion. 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 Stable mild aneurysmal dilatation of the proximal infrarenal abdominal aorta up to 3.1 cm, penetrating aortic ulcer or ulcerated plaque at this level measuring approximately 1 cm. 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 Mediastinum/Nodes: Midline trachea. No thyroid mass. No suspicious adenopathy. Esophagus within normal limits. Lungs/Pleura: Small bilateral pleural effusions with dependent atelectasis. Mild subpleural fibrosis. Scarring and fibrosis at the apices. Upper Abdomen: No acute abnormality. Small gallstones. Small amount of free fluid within the upper abdomen. Musculoskeletal: Chronic sternal fracture Review of the MIP images confirms the above findings. IMPRESSION: 1. Small nonocclusive thrombus within the left lower lobe distal segmental/subsegmental vessels. 2. Small bilateral pleural effusions with dependent atelectasis. 3. 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 Stable mild aneurysmal dilatation of the proximal infrarenal abdominal  aorta up to  3.1 cm, penetrating aortic ulcer or ulcerated plaque at this level measuring approximately 1 cm. 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 Recommend follow-up ultrasound every 3 years. This recommendation follows ACR consensus guidelines: White Paper of the ACR Incidental Findings Committee II on Vascular Findings. J Am Coll Radiol 2013; 10:789-794. 4. Gallstones. 5. Small amount of free fluid within the upper abdomen. 6. Aortic atherosclerosis. Critical Value/emergent results were called by telephone at the time of interpretation on 09/12/2020 at 5:36 pm to provider Bill Yohn , who verbally acknowledged these results. Aortic Atherosclerosis (ICD10-I70.0). Electronically Signed   By: Donavan Foil M.D.   On: 09/12/2020 17:37   CT ABDOMEN PELVIS W CONTRAST  Result Date: 09/12/2020 CLINICAL DATA:  Abdominal distension EXAM: CT ABDOMEN AND PELVIS WITH CONTRAST TECHNIQUE: Multidetector CT imaging of the abdomen and pelvis was performed using the standard protocol following bolus administration of intravenous contrast. CONTRAST:  31mL OMNIPAQUE IOHEXOL 350 MG/ML SOLN COMPARISON:  CT 06/20/2020 FINDINGS: Lower chest: Lung bases demonstrate small bilateral pleural effusions and dependent atelectasis. Coronary vascular calcification. Normal cardiac size. Hepatobiliary: Small gallstones. No biliary dilatation. No focal hepatic abnormality Pancreas: Unremarkable. No pancreatic ductal dilatation or surrounding inflammatory changes. Spleen: Peripheral slightly wedge-shaped hypodensities within the posterior spleen are indeterminate for small infarcts. Adrenals/Urinary Tract: Adrenal glands are normal. 6.6 cm cyst exophytic to the upper left kidney. Small cyst lower pole left kidney. No hydronephrosis. The bladder is normal Stomach/Bowel: The stomach is nonenlarged. No dilated small bowel. Marked diffuse wall thickening of the entire colon with mucosal enhancement. Grossly negative appendix.  Diverticular disease of the sigmoid colon. Vascular/Lymphatic: Extensive aortic atherosclerosis. Penetrating aortic ulcers or ulcerated plaque at the supra and infrarenal abdominal aorta described on chest CT. Mild aneurysmal dilatation of the infrarenal abdominal aorta measuring up to 3.1 cm. 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 No suspicious nodes Reproductive: Prostate is unremarkable. Other: No free air. Small quantity of free fluid within the abdomen and pelvis. Generalized subcutaneous edema consistent with anasarca Musculoskeletal: Status post right femoral intramedullary rodding for comminuted intertrochanteric fracture. IMPRESSION: 1. Marked diffuse wall thickening of the entire colon with mucosal enhancement, consistent with pancolitis of infectious, inflammatory, or ischemic etiology. This likely corresponds to the history of C difficile colitis. There is no perforation or abscess. 2. Small quantity of free fluid within the abdomen and pelvis. Generalized subcutaneous edema consistent with anasarca. 3. Peripheral slightly wedge-shaped hypodensities within the posterior spleen, indeterminate for small infarcts. 4. Extensive aortic atherosclerosis with penetrating aortic ulcers or ulcerated plaque at the supra and infrarenal abdominal aorta without significant change since 06/20/2020. Mild aneurysmal dilatation of the infrarenal abdominal aorta measuring up to 3.1 cm. Recommend follow-up ultrasound every 3 years. This recommendation follows ACR consensus guidelines: White Paper of the ACR Incidental Findings Committee II on Vascular Findings. J Am Coll Radiol 2013; 10:789-794. 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 5. Small bilateral pleural effusions and dependent atelectasis. 6. Cholelithiasis. 7. Status post right femoral intramedullary rodding for comminuted intertrochanteric fracture. Critical Value/emergent results were called by telephone at the time of interpretation on  09/12/2020 at 5:36 pm to provider Krista Godsil , who verbally acknowledged these results. Aortic Atherosclerosis (ICD10-I70.0). Electronically Signed   By: Donavan Foil M.D.   On: 09/12/2020 17:36   DG Chest Portable 1 View  Result Date: 09/12/2020 CLINICAL DATA:  Hypoxia EXAM: PORTABLE CHEST 1 VIEW COMPARISON:  07/20/2020 FINDINGS: Trace right and small left pleural effusion.  Consolidation at the left lung base. Normal cardiomediastinal silhouette with aortic atherosclerosis. No pneumothorax. IMPRESSION: Trace right and small left pleural effusion with left basilar atelectasis or pneumonia. Electronically Signed   By: Donavan Foil M.D.   On: 09/12/2020 16:05    ____________________________________________   PROCEDURES .Critical Care Performed by: Carrie Mew, MD Authorized by: Carrie Mew, MD   Critical care provider statement:    Critical care time (minutes):  33   Critical care time was exclusive of:  Separately billable procedures and treating other patients   Critical care was necessary to treat or prevent imminent or life-threatening deterioration of the following conditions:  Sepsis, shock, dehydration and metabolic crisis   Critical care was time spent personally by me on the following activities:  Development of treatment plan with patient or surrogate, discussions with consultants, evaluation of patient's response to treatment, examination of patient, obtaining history from patient or surrogate, ordering and performing treatments and interventions, ordering and review of laboratory studies, ordering and review of radiographic studies, pulse oximetry, re-evaluation of patient's condition and review of old charts    ____________________________________________  DIFFERENTIAL DIAGNOSIS   Dehydration, electrolyte abnormalities, metabolic acidosis, sepsis, bowel perforation, intra-abdominal abscess, pleural effusion, pulmonary edema, pulmonary embolism  CLINICAL  IMPRESSION / ASSESSMENT AND PLAN / ED COURSE  Medications ordered in the ED: Medications  sodium chloride 0.9 % bolus 1,000 mL (0 mLs Intravenous Stopped 09/12/20 1639)  ceFEPIme (MAXIPIME) 2 g in sodium chloride 0.9 % 100 mL IVPB (0 g Intravenous Stopped 09/12/20 1558)  metroNIDAZOLE (FLAGYL) IVPB 500 mg (0 mg Intravenous Stopped 09/12/20 1639)  sodium chloride 0.9 % bolus 500 mL (0 mLs Intravenous Stopped 09/12/20 1715)  iohexol (OMNIPAQUE) 350 MG/ML injection 60 mL (60 mLs Intravenous Contrast Given 09/12/20 1649)    Pertinent labs & imaging results that were available during my care of the patient were reviewed by me and considered in my medical decision making (see chart for details).  Keiji P Howell was evaluated in Emergency Department on 09/12/2020 for the symptoms described in the history of present illness. He was evaluated in the context of the global COVID-19 pandemic, which necessitated consideration that the patient might be at risk for infection with the SARS-CoV-2 virus that causes COVID-19. Institutional protocols and algorithms that pertain to the evaluation of patients at risk for COVID-19 are in a state of rapid change based on information released by regulatory bodies including the CDC and federal and state organizations. These policies and algorithms were followed during the patient's care in the ED.   Patient presents with hypoxia, tachycardia, white blood cell count of 40,000 on labs done 2 days ago which were reviewed by me.  Differential is broad with recent surgery and rehab stay and C. difficile treatment.  We will start with sepsis protocol, cefepime and Flagyl antibiotic coverage, IV fluids for hydration.  No signs of shock.  We will obtain chest x-ray, if nondiagnostic will need CT angiogram chest to rule out PE and CT abdomen pelvis.  Clinical Course as of 09/12/20 1949  Thu Sep 12, 2020  1556 SpO2(!): 2 % Erroneous documentation of oxygen saturation.  Patient's room air oxygen  saturation was 72%, increased to 96% on 2 L nasal cannula. [PS]  5277 Chest x-ray viewed and interpreted by me, shows small right pleural effusion, small left pleural effusion with left basilar infiltrate.  Radiology report confirms.  No pneumothorax.  No edema.  Does not explain the degree of hypoxia, will need CT  scans. [PS]  1610 CT discussed with radiology Dr. Francoise Ceo who notes CT chest is positive for a nonocclusive PE in the left lower lobe subsegmental branch.  CT abdomen pelvis shows pancolitis and ascites, no perforation or free air. [PS]  1831 Repeat lactate improved. No hypotension. No persistent shock, vasopressors not indicated [PS]    Clinical Course User Index [PS] Carrie Mew, MD     ----------------------------------------- 6:35 PM on 09/12/2020 ----------------------------------------- Sepsis reassessment has been completed.    ____________________________________________   FINAL CLINICAL IMPRESSION(S) / ED DIAGNOSES    Final diagnoses:  C. difficile colitis  Failure of outpatient treatment  Sepsis without acute organ dysfunction, due to unspecified organism [facility])     ED Discharge Orders    None      Portions of this note were generated with dragon dictation software. Dictation errors may occur despite best attempts at proofreading.   Carrie Mew, MD 09/12/20 1949    Carrie Mew, MD 09/12/20 2122

## 2020-09-12 NOTE — ED Triage Notes (Signed)
Pt from home, saw Dr. [name] a few days ago. Pt with diarrhea since 08/09/2020 and increasing SOB today. Pt not usually on O2, now on 2 L [place]. Pt alert, HOH, oriented to situation. Pt with elevated WBC.

## 2020-09-13 ENCOUNTER — Inpatient Hospital Stay: Payer: Medicare Other

## 2020-09-13 ENCOUNTER — Encounter: Payer: Self-pay | Admitting: Internal Medicine

## 2020-09-13 DIAGNOSIS — N179 Acute kidney failure, unspecified: Secondary | ICD-10-CM | POA: Diagnosis not present

## 2020-09-13 DIAGNOSIS — E871 Hypo-osmolality and hyponatremia: Secondary | ICD-10-CM | POA: Diagnosis not present

## 2020-09-13 DIAGNOSIS — A0471 Enterocolitis due to Clostridium difficile, recurrent: Secondary | ICD-10-CM

## 2020-09-13 DIAGNOSIS — E875 Hyperkalemia: Secondary | ICD-10-CM | POA: Diagnosis not present

## 2020-09-13 DIAGNOSIS — A419 Sepsis, unspecified organism: Secondary | ICD-10-CM | POA: Diagnosis not present

## 2020-09-13 LAB — COMPREHENSIVE METABOLIC PANEL
ALT: 12 U/L (ref 0–44)
AST: 26 U/L (ref 15–41)
Albumin: 1.6 g/dL — ABNORMAL LOW (ref 3.5–5.0)
Alkaline Phosphatase: 170 U/L — ABNORMAL HIGH (ref 38–126)
Anion gap: 6 (ref 5–15)
BUN: 27 mg/dL — ABNORMAL HIGH (ref 8–23)
CO2: 22 mmol/L (ref 22–32)
Calcium: 7 mg/dL — ABNORMAL LOW (ref 8.9–10.3)
Chloride: 104 mmol/L (ref 98–111)
Creatinine, Ser: 1.05 mg/dL (ref 0.61–1.24)
GFR, Estimated: >60 mL/min (ref >60)
Glucose, Bld: 70 mg/dL (ref 70–99)
Potassium: 5 mmol/L (ref 3.5–5.1)
Sodium: 132 mmol/L — ABNORMAL LOW (ref 135–145)
Total Bilirubin: 1 mg/dL (ref 0.3–1.2)
Total Protein: 4.2 g/dL — ABNORMAL LOW (ref 6.5–8.1)

## 2020-09-13 LAB — GLUCOSE, CAPILLARY
Glucose-Capillary: 52 mg/dL — ABNORMAL LOW (ref 70–99)
Glucose-Capillary: 55 mg/dL — ABNORMAL LOW (ref 70–99)
Glucose-Capillary: 65 mg/dL — ABNORMAL LOW (ref 70–99)
Glucose-Capillary: 70 mg/dL (ref 70–99)
Glucose-Capillary: 74 mg/dL (ref 70–99)

## 2020-09-13 LAB — PROCALCITONIN: Procalcitonin: 1.94 ng/mL

## 2020-09-13 LAB — CBC
HCT: 33.6 % — ABNORMAL LOW (ref 39.0–52.0)
Hemoglobin: 11 g/dL — ABNORMAL LOW (ref 13.0–17.0)
MCH: 28.7 pg (ref 26.0–34.0)
MCHC: 32.7 g/dL (ref 30.0–36.0)
MCV: 87.7 fL (ref 80.0–100.0)
Platelets: 139 10*3/uL — ABNORMAL LOW (ref 150–400)
RBC: 3.83 MIL/uL — ABNORMAL LOW (ref 4.22–5.81)
RDW: 18.6 % — ABNORMAL HIGH (ref 11.5–15.5)
WBC: 22.6 10*3/uL — ABNORMAL HIGH (ref 4.0–10.5)
nRBC: 0.1 % (ref 0.0–0.2)

## 2020-09-13 LAB — MAGNESIUM: Magnesium: 2.3 mg/dL (ref 1.7–2.4)

## 2020-09-13 MED ORDER — LACTATED RINGERS IV SOLN: INTRAVENOUS | Status: DC

## 2020-09-13 MED ORDER — FIDAXOMICIN 200 MG PO TABS
200.0000 mg | ORAL_TABLET | Freq: Two times a day (BID) | ORAL | Status: AC
  Administered 2020-09-13 – 2020-09-22 (×20): 200 mg via ORAL
  Filled 2020-09-13 (×21): qty 1

## 2020-09-13 NOTE — Progress Notes (Signed)
Initial Nutrition Assessment  DOCUMENTATION CODES:   Severe malnutrition in context of chronic illness  INTERVENTION:  Continue Ensure Enlive po BID, each supplement provides 350 kcal and 20 grams of protein.  Provide Magic cup TID with meals, each supplement provides 290 kcal and 9 grams of protein.  NUTRITION DIAGNOSIS:   Severe Malnutrition related to chronic illness (lymphoproliferative disorder, inadequate oral intake) as evidenced by severe fat depletion,severe muscle depletion.  GOAL:   Patient will meet greater than or equal to 90% of their needs  MONITOR:   PO intake,Supplement acceptance,Labs,Weight trends,I & O's  REASON FOR ASSESSMENT:   Malnutrition Screening Tool    ASSESSMENT:   87 year old male who is HOH with PMHx of GI bleed, GERD, lymphoproliferative disorder, iron deficiency anemia, CAD s/p DES, recent lower GI bleed, recent C difficile infection admitted with severe sepsis due to recurrent C difficile colitis, AKI.   Attempted to meet with patient at bedside this morning and he was out of the room for a procedure. Returned in the afternoon and was able to meet with him at bedside. He reports he has had a decreased appetite for a while now. He reports he tries to eat 2-3 meals per day and has been eating small amounts of food. He was not able to eat much breakfast at all. He is edentulous and reports he cannot wear his dentures anymore. His diet was downgraded to dysphagia 2 with thin liquids today. He is amenable to trying Ensure Enlive and Magic Cup to help meet calorie/protein needs.  Patient reports his UBW was 145 lbs and he has been slowly losing weight over time. Patient currently documented to be 52.1 kg (114.86 lbs).  Medications reviewed and include: Divifid, Flagyl.  Labs reviewed: CBG 55-74.  NUTRITION - FOCUSED PHYSICAL EXAM:  Flowsheet Row Most Recent Value  Orbital Region Severe depletion  Upper Arm Region Severe depletion  Thoracic and  Lumbar Region Severe depletion  Buccal Region Severe depletion  Temple Region Severe depletion  Clavicle Bone Region Severe depletion  Clavicle and Acromion Bone Region Severe depletion  Scapular Bone Region Severe depletion  Dorsal Hand Severe depletion  Patellar Region Severe depletion  Anterior Thigh Region Severe depletion  Posterior Calf Region Severe depletion  Edema (RD Assessment) Mild  Hair Reviewed  Eyes Reviewed  Mouth Reviewed  [edentulous]  Skin Reviewed  Nails Reviewed     Diet Order:   Diet Order            DIET DYS 2 Room service appropriate? Yes; Fluid consistency: Thin  Diet effective now                EDUCATION NEEDS:   No education needs have been identified at this time  Skin:  Skin Assessment: Reviewed RN Assessment  Last BM:  09/13/2020 - large type 7  Height:   Ht Readings from Last 1 Encounters:  09/12/20 5\' 5"  (1.651 m)   Weight:   Wt Readings from Last 1 Encounters:  09/13/20 52.1 kg   BMI:  Body mass index is 19.11 kg/m.  Estimated Nutritional Needs:   Kcal:  1500-1700  Protein:  75-85 grams  Fluid:  1.5-1.7 L/day  Jacklynn Barnacle, MS, RD, LDN Pager number available on Amion

## 2020-09-13 NOTE — TOC Benefit Eligibility Note (Addendum)
Patient Advocate Encounter  Patient is approved through the [employer] Patient Assistance Program for D[facility] through 07/12/2021.  Medication will be sent to Inpatient [facility] at [place], [place] Patient Advocate Specialist Grant Team Direct Number: [phone]  Fax: [phone]

## 2020-09-13 NOTE — Plan of Care (Signed)
  Problem: Clinical Measurements: Goal: Respiratory complications will improve Outcome: Progressing   Problem: Clinical Measurements: Goal: Cardiovascular complication will be avoided Outcome: Progressing   Problem: Coping: Goal: Level of anxiety will decrease Outcome: Progressing   Problem: Pain Managment: Goal: General experience of comfort will improve Outcome: Progressing   Problem: Safety: Goal: Ability to remain free from injury will improve Outcome: Progressing   Problem: Skin Integrity: Goal: Risk for impaired skin integrity will decrease Outcome: Progressing   Problem: Fluid Volume: Goal: Hemodynamic stability will improve Outcome: Progressing

## 2020-09-13 NOTE — Progress Notes (Addendum)
Progress Note    Dustin Howell  CBS:[id number] DOB: May 05, 1925  DOA: 09/12/2020 PCP: [name], MD      Brief Narrative:    Medical records reviewed and are as summarized below:  Dustin Howell is a 87 y.o. male  with medical history significant for history of C. difficile colitis, CAD s/p DES, lymphoproliferative disorder, recent lower GI bleed, recent C. difficile infection, hearing impairment, recent discharge from the hospital on 07/26/2020 after hospitalization for right hip fracture s/p IM nailing on 07/21/2020.  He had recently seen Dr. [name] on 09/10/2020 for recurrent diarrhea and he was restarted on oral vancomycin.  He presented to the hospital with frequent diarrhea.  He was admitted to the hospital for severe sepsis secondary to recurrent C. difficile colitis.  He was treated with antibiotics and IV fluids.  Was also found to have left-sided pulmonary embolism, hyponatremia and AKI complicated by hyperkalemia.    Assessment/Plan:   Principal Problem:   Severe sepsis (HCC) Active Problems:   CAD (coronary artery disease)   Recurrent colitis due to Clostridium difficile   AKI (acute kidney injury) ([place])   Hyponatremia   Hyperkalemia Severe protein calorie malnutrition-follow-up with dietitian.  Body mass index is 19.11 kg/m.    Severe sepsis due to recurrent C. difficile colitis, leukocytosis: Continue IV fluids for hydration.  Substitute oral fidaxomicin for oral vancomycin.  Continue IV Flagyl for now.  Acute pulmonary embolism: Venous duplex of bilateral lower extremities did not show any evidence of DVT.  Discussed risks and benefits of treatment with anticoagulation with the patient.  Discussed use of IVC filter as well but patient does not want any procedures or aggressive measures.  Case was discussed with her daughter, [name].  His daughter said that we should hold off on therapeutic anticoagulants for now until she has further discussions with  the patient.  She thinks patient may not want to do anything because he feels he is ready to die.  Acute respiratory failure with hypoxia: Resolved  AKI and hyperkalemia: Improved  Hyponatremia: Improved  CAD s/p DES: Continue aspirin and Lipitor  History of GI bleed: According to his daughter, patient almost died from rectal bleeding about 5 years ago.  He had another episode of GI bleed in December 2021.  Flexible sigmoidoscopy showed AVM and diverticula.           Diet Order            DIET DYS 2 Room service appropriate? Yes; Fluid consistency: Thin  Diet effective now                    Consultants:  None  Procedures:  None    Medications:   . aspirin  81 mg Oral Daily  . atorvastatin  10 mg Oral Daily  . feeding supplement  237 mL Oral BID BM  . fidaxomicin  200 mg Oral BID  . heparin  5,000 Units Subcutaneous Q8H   Continuous Infusions: . metronidazole 500 mg (09/13/20 0900)     Anti-infectives (From admission, onward)   Start     Dose/Rate Route Frequency Ordered Stop   11/08/20 1000  vancomycin (VANCOCIN) 50 mg/mL oral solution 125 mg  Status:  Discontinued       "Followed by" Linked Group Details   125 mg Oral Every 3 DAYS 09/12/20 2017 09/13/20 0955   10/11/20 1000  vancomycin (VANCOCIN) 50 mg/mL oral solution 125 mg  Status:  Discontinued       "Followed by" Linked Group Details   125 mg Oral Every other day 09/12/20 2017 09/13/20 0955   10/04/20 1000  vancomycin (VANCOCIN) 50 mg/mL oral solution 125 mg  Status:  Discontinued       "Followed by" Linked Group Details   125 mg Oral Daily 09/12/20 2017 09/13/20 0955   09/27/20 1000  vancomycin (VANCOCIN) 50 mg/mL oral solution 125 mg  Status:  Discontinued       "Followed by" Linked Group Details   125 mg Oral 2 times daily 09/12/20 2017 09/13/20 0955   09/13/20 1200  fidaxomicin (DIFICID) tablet 200 mg        200 mg Oral 2 times daily 09/13/20 0955 09/23/20 0959   09/13/20 0000   metroNIDAZOLE (FLAGYL) IVPB 500 mg        500 mg 100 mL/hr over 60 Minutes Intravenous Every 8 hours 09/12/20 2017     09/12/20 2200  vancomycin (VANCOCIN) 50 mg/mL oral solution 125 mg  Status:  Discontinued       "Followed by" Linked Group Details   125 mg Oral 4 times daily 09/12/20 2017 09/13/20 0955   09/12/20 1545  ceFEPIme (MAXIPIME) 2 g in sodium chloride 0.9 % 100 mL IVPB        2 g 200 mL/hr over 30 Minutes Intravenous  Once 09/12/20 1537 09/12/20 1558   09/12/20 1545  metroNIDAZOLE (FLAGYL) IVPB 500 mg        500 mg 100 mL/hr over 60 Minutes Intravenous  Once 09/12/20 1537 09/12/20 1639             Family Communication/Anticipated D/C date and plan/Code Status   DVT prophylaxis: heparin injection 5,000 Units Start: 09/12/20 2200     Code Status: DNR  Family Communication: Discussed with his daughter, [name] Disposition Plan:    Status is: Inpatient  Remains inpatient appropriate because:IV treatments appropriate due to intensity of illness or inability to take PO   Dispo: The patient is from: Home              Anticipated d/c is to: Home              Patient currently is not medically stable to d/c.   Difficult to place patient No           Subjective:   "I always have diarrhea". No chest pain or shortness of breath  Objective:    Vitals:   09/13/20 0436 09/13/20 0500 09/13/20 0810 09/13/20 1218  BP: 129/67  125/66 129/74  Pulse: 92  90 96  Resp: 16  15 14   Temp: 97.8 F (36.6 C)  97.6 F (36.4 C) 97.8 F (36.6 C)  TempSrc: Oral  Oral Oral  SpO2: 93%  99% 99%  Weight:  52.1 kg    Height:       No data found.   Intake/Output Summary (Last 24 hours) at 09/13/2020 1352 Last data filed at 09/13/2020 0534 Gross per 24 hour  Intake 2567.82 ml  Output --  Net 2567.82 ml   Filed Weights   09/12/20 1454 09/12/20 2110 09/13/20 0500  Weight: 45.4 kg 52.6 kg 52.1 kg    Exam:  GEN: NAD SKIN: No rash EYES: EOMI ENT: MMM CV:  RRR PULM: CTA B ABD: soft, distended, NT, +BS CNS: AAO x 3, non focal EXT: B/l leg edema, no erythema or tenderness       Data Reviewed:   I  have personally reviewed following labs and imaging studies:  Labs: Labs show the following:   Basic Metabolic Panel: Recent Labs  Lab 09/12/20 1521 09/13/20 0525  NA 126* 132*  K 5.6* 5.0  CL 94* 104  CO2 20* 22  GLUCOSE 198* 70  BUN 35* 27*  CREATININE 1.48* 1.05  CALCIUM 7.6* 7.0*  MG  --  2.3   GFR Estimated Creatinine Clearance: 31.7 mL/min (by C-G formula based on SCr of 1.05 mg/dL). Liver Function Tests: Recent Labs  Lab 09/12/20 1521 09/13/20 0525  AST 37 26  ALT 17 12  ALKPHOS 223* 170*  BILITOT 1.1 1.0  PROT 5.5* 4.2*  ALBUMIN 2.0* 1.6*   No results for input(s): LIPASE, AMYLASE in the last 168 hours. No results for input(s): AMMONIA in the last 168 hours. Coagulation profile Recent Labs  Lab 09/12/20 1521  INR 1.0    CBC: Recent Labs  Lab 09/12/20 1521 09/13/20 0525  WBC 36.0* 22.6*  NEUTROABS 33.8*  --   HGB 12.3* 11.0*  HCT 38.8* 33.6*  MCV 88.6 87.7  PLT 185 139*   Cardiac Enzymes: No results for input(s): CKTOTAL, CKMB, CKMBINDEX, TROPONINI in the last 168 hours. BNP (last 3 results) No results for input(s): PROBNP in the last 8760 hours. CBG: Recent Labs  Lab 09/13/20 0647 09/13/20 0658 09/13/20 0700 09/13/20 0705 09/13/20 0728  GLUCAP 52* 55* 65* 70 74   D-Dimer: No results for input(s): DDIMER in the last 72 hours. Hgb A1c: No results for input(s): HGBA1C in the last 72 hours. Lipid Profile: No results for input(s): CHOL, HDL, LDLCALC, TRIG, CHOLHDL, LDLDIRECT in the last 72 hours. Thyroid function studies: No results for input(s): TSH, T4TOTAL, T3FREE, THYROIDAB in the last 72 hours.  Invalid input(s): FREET3 Anemia work up: No results for input(s): VITAMINB12, FOLATE, FERRITIN, TIBC, IRON, RETICCTPCT in the last 72 hours. Sepsis Labs: Recent Labs  Lab  09/12/20 1521 09/13/20 0525  PROCALCITON  --  1.94  WBC 36.0* 22.6*  LATICACIDVEN 2.8*  4.1*  --     Microbiology Recent Results (from the past 240 hour(s))  Culture, blood (Routine x 2)     Status: None (Preliminary result)   Collection Time: 09/12/20  3:21 PM   Specimen: BLOOD  Result Value Ref Range Status   Specimen Description BLOOD RIGHT ANTECUBITAL  Final   Special Requests   Final    BOTTLES DRAWN AEROBIC AND ANAEROBIC Blood Culture adequate volume   Culture   Final    NO GROWTH < 24 HOURS Performed at [facility], [address], [place], [place] [address]    Report Status PENDING  Incomplete  Culture, blood (Routine x 2)     Status: None (Preliminary result)   Collection Time: 09/12/20  3:21 PM   Specimen: BLOOD  Result Value Ref Range Status   Specimen Description BLOOD LEFT ANTECUBITAL  Final   Special Requests   Final    BOTTLES DRAWN AEROBIC AND ANAEROBIC Blood Culture adequate volume   Culture   Final    NO GROWTH < 24 HOURS Performed at [facility], [address], [place],  [address]    Report Status PENDING  Incomplete  Resp Panel by RT-PCR (Flu A&B, Covid) Nasopharyngeal Swab     Status: None   Collection Time: 09/12/20  3:21 PM   Specimen: Nasopharyngeal Swab; Nasopharyngeal(NP) swabs in vial transport medium  Result Value Ref Range Status   SARS Coronavirus 2 by RT PCR NEGATIVE  NEGATIVE Final    Comment: (NOTE) SARS-CoV-2 target nucleic acids are NOT DETECTED.  The SARS-CoV-2 RNA is generally detectable in upper respiratory specimens during the acute phase of infection. The lowest concentration of SARS-CoV-2 viral copies this assay can detect is 138 copies/mL. A negative result does not preclude SARS-Cov-2 infection and should not be used as the sole basis for treatment or other patient management decisions. A negative result may occur with  improper specimen collection/handling, submission of specimen other than  nasopharyngeal swab, presence of viral mutation(s) within the areas targeted by this assay, and inadequate number of viral copies(<138 copies/mL). A negative result must be combined with clinical observations, patient history, and epidemiological information. The expected result is Negative.  Fact Sheet for Patients:  EntrepreneurPulse.com.au  Fact Sheet for Healthcare Providers:  IncredibleEmployment.be  This test is no t yet approved or cleared by the [place] FDA and  has been authorized for detection and/or diagnosis of SARS-CoV-2 by FDA under an Emergency Use Authorization (EUA). This EUA will remain  in effect (meaning this test can be used) for the duration of the COVID-19 declaration under Section 564(b)(1) of the Act, 21 U.S.C.section 360bbb-3(b)(1), unless the authorization is terminated  or revoked sooner.       Influenza A by PCR NEGATIVE NEGATIVE Final   Influenza B by PCR NEGATIVE NEGATIVE Final    Comment: (NOTE) The Xpert Xpress SARS-CoV-2/FLU/RSV plus assay is intended as an aid in the diagnosis of influenza from Nasopharyngeal swab specimens and should not be used as a sole basis for treatment. Nasal washings and aspirates are unacceptable for Xpert Xpress SARS-CoV-2/FLU/RSV testing.  Fact Sheet for Patients: EntrepreneurPulse.com.au  Fact Sheet for Healthcare Providers: IncredibleEmployment.be  This test is not yet approved or cleared by the [place] FDA and has been authorized for detection and/or diagnosis of SARS-CoV-2 by FDA under an Emergency Use Authorization (EUA). This EUA will remain in effect (meaning this test can be used) for the duration of the COVID-19 declaration under Section 564(b)(1) of the Act, 21 U.S.C. section 360bbb-3(b)(1), unless the authorization is terminated or revoked.  Performed at [facility], [address] [place], [place]  [address]   Gastrointestinal Panel by PCR , Stool     Status: None   Collection Time: 09/12/20  3:21 PM   Specimen: STOOL  Result Value Ref Range Status   Campylobacter species NOT DETECTED NOT DETECTED Final   Plesimonas shigelloides NOT DETECTED NOT DETECTED Final   Salmonella species NOT DETECTED NOT DETECTED Final   Yersinia enterocolitica NOT DETECTED NOT DETECTED Final   Vibrio species NOT DETECTED NOT DETECTED Final   Vibrio cholerae NOT DETECTED NOT DETECTED Final   Enteroaggregative E coli (EAEC) NOT DETECTED NOT DETECTED Final   Enteropathogenic E coli (EPEC) NOT DETECTED NOT DETECTED Final   Enterotoxigenic E coli (ETEC) NOT DETECTED NOT DETECTED Final   Shiga like toxin producing E coli (STEC) NOT DETECTED NOT DETECTED Final   Shigella/Enteroinvasive E coli (EIEC) NOT DETECTED NOT DETECTED Final   Cryptosporidium NOT DETECTED NOT DETECTED Final   Cyclospora cayetanensis NOT DETECTED NOT DETECTED Final   Entamoeba histolytica NOT DETECTED NOT DETECTED Final   Giardia lamblia NOT DETECTED NOT DETECTED Final   Adenovirus F40/41 NOT DETECTED NOT DETECTED Final   Astrovirus NOT DETECTED NOT DETECTED Final   Norovirus GI/GII NOT DETECTED NOT DETECTED Final   Rotavirus A NOT DETECTED NOT DETECTED Final   Sapovirus (I, II, IV, and V) NOT DETECTED NOT  DETECTED Final    Comment: Performed at [facility], [place], [place] [address]  C Difficile Quick Screen w PCR reflex     Status: Abnormal   Collection Time: 09/12/20  3:21 PM   Specimen: STOOL  Result Value Ref Range Status   C Diff antigen POSITIVE (A) NEGATIVE Final   C Diff toxin POSITIVE (A) NEGATIVE Final   C Diff interpretation Toxin producing C. difficile detected.  Final    Comment: CRITICAL RESULT CALLED TO, READ BACK BY AND VERIFIED WITH: RAQUEL DAVID @2057  ON 09/12/20 SKL Performed at [facility], [address] [place], [place] [address]     Procedures and diagnostic  studies:  CT Angio Chest PE W and/or Wo Contrast  Result Date: 09/12/2020 CLINICAL DATA:  Shortness of breath EXAM: CT ANGIOGRAPHY CHEST WITH CONTRAST TECHNIQUE: Multidetector CT imaging of the chest was performed using the standard protocol during bolus administration of intravenous contrast. Multiplanar CT image reconstructions and MIPs were obtained to evaluate the vascular anatomy. CONTRAST:  27mL OMNIPAQUE IOHEXOL 350 MG/ML SOLN COMPARISON:  Chest x-ray 09/12/2020, CT chest 11/26/2008, CT angiography 06/21/2019 FINDINGS: Cardiovascular: Satisfactory opacification of the pulmonary arteries to the segmental level. Small nonocclusive thrombus within left lower lobe distal segmental/subsegmental vessel. No other discrete filling defects are visualized. Moderate aortic atherosclerosis without dissection. Coronary vascular calcification. Normal cardiac size. Trace pericardial effusion. 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 Stable mild aneurysmal dilatation of the proximal infrarenal abdominal aorta up to 3.1 cm, penetrating aortic ulcer or ulcerated plaque at this level measuring approximately 1 cm. 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 Mediastinum/Nodes: Midline trachea. No thyroid mass. No suspicious adenopathy. Esophagus within normal limits. Lungs/Pleura: Small bilateral pleural effusions with dependent atelectasis. Mild subpleural fibrosis. Scarring and fibrosis at the apices. Upper Abdomen: No acute abnormality. Small gallstones. Small amount of free fluid within the upper abdomen. Musculoskeletal: Chronic sternal fracture Review of the MIP images confirms the above findings. IMPRESSION: 1. Small nonocclusive thrombus within the left lower lobe distal segmental/subsegmental vessels. 2. Small bilateral pleural effusions with dependent atelectasis. 3. 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 Stable mild aneurysmal  dilatation of the proximal infrarenal abdominal aorta up to 3.1 cm, penetrating aortic ulcer or ulcerated plaque at this level measuring approximately 1 cm. 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 Recommend follow-up ultrasound every 3 years. This recommendation follows ACR consensus guidelines: White Paper of the ACR Incidental Findings Committee II on Vascular Findings. J Am Coll Radiol 2013; 10:789-794. 4. Gallstones. 5. Small amount of free fluid within the upper abdomen. 6. Aortic atherosclerosis. Critical Value/emergent results were called by telephone at the time of interpretation on 09/12/2020 at 5:36 pm to provider PHILLIP STAFFORD , who verbally acknowledged these results. Aortic Atherosclerosis (ICD10-I70.0). Electronically Signed   By: Donavan Foil M.D.   On: 09/12/2020 17:37   CT ABDOMEN PELVIS W CONTRAST  Result Date: 09/12/2020 CLINICAL DATA:  Abdominal distension EXAM: CT ABDOMEN AND PELVIS WITH CONTRAST TECHNIQUE: Multidetector CT imaging of the abdomen and pelvis was performed using the standard protocol following bolus administration of intravenous contrast. CONTRAST:  52mL OMNIPAQUE IOHEXOL 350 MG/ML SOLN COMPARISON:  CT 06/20/2020 FINDINGS: Lower chest: Lung bases demonstrate small bilateral pleural effusions and dependent atelectasis. Coronary vascular calcification. Normal cardiac size. Hepatobiliary: Small gallstones. No biliary dilatation. No focal hepatic abnormality Pancreas: Unremarkable. No pancreatic ductal dilatation or surrounding inflammatory changes. Spleen:  Peripheral slightly wedge-shaped hypodensities within the posterior spleen are indeterminate for small infarcts. Adrenals/Urinary Tract: Adrenal glands are normal. 6.6 cm cyst exophytic to the upper left kidney. Small cyst lower pole left kidney. No hydronephrosis. The bladder is normal Stomach/Bowel: The stomach is nonenlarged. No dilated small bowel. Marked diffuse wall thickening of the entire colon with mucosal  enhancement. Grossly negative appendix. Diverticular disease of the sigmoid colon. Vascular/Lymphatic: Extensive aortic atherosclerosis. Penetrating aortic ulcers or ulcerated plaque at the supra and infrarenal abdominal aorta described on chest CT. Mild aneurysmal dilatation of the infrarenal abdominal aorta measuring up to 3.1 cm. 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 No suspicious nodes Reproductive: Prostate is unremarkable. Other: No free air. Small quantity of free fluid within the abdomen and pelvis. Generalized subcutaneous edema consistent with anasarca Musculoskeletal: Status post right femoral intramedullary rodding for comminuted intertrochanteric fracture. IMPRESSION: 1. Marked diffuse wall thickening of the entire colon with mucosal enhancement, consistent with pancolitis of infectious, inflammatory, or ischemic etiology. This likely corresponds to the history of C difficile colitis. There is no perforation or abscess. 2. Small quantity of free fluid within the abdomen and pelvis. Generalized subcutaneous edema consistent with anasarca. 3. Peripheral slightly wedge-shaped hypodensities within the posterior spleen, indeterminate for small infarcts. 4. Extensive aortic atherosclerosis with penetrating aortic ulcers or ulcerated plaque at the supra and infrarenal abdominal aorta without significant change since 06/20/2020. Mild aneurysmal dilatation of the infrarenal abdominal aorta measuring up to 3.1 cm. Recommend follow-up ultrasound every 3 years. This recommendation follows ACR consensus guidelines: White Paper of the ACR Incidental Findings Committee II on Vascular Findings. J Am Coll Radiol 2013; 10:789-794. 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 5. Small bilateral pleural effusions and dependent atelectasis. 6. Cholelithiasis. 7. Status post right femoral intramedullary rodding for comminuted intertrochanteric fracture. Critical Value/emergent results were called by  telephone at the time of interpretation on 09/12/2020 at 5:36 pm to provider PHILLIP STAFFORD , who verbally acknowledged these results. Aortic Atherosclerosis (ICD10-I70.0). Electronically Signed   By: Donavan Foil M.D.   On: 09/12/2020 17:36   US Venous Img Lower Bilateral (DVT)  Result Date: 09/13/2020 CLINICAL DATA:  87 year old male with bilateral lower extremity leg swelling, pulmonary embolism. EXAM: BILATERAL LOWER EXTREMITY VENOUS DOPPLER ULTRASOUND TECHNIQUE: Gray-scale sonography with graded compression, as well as color Doppler and duplex ultrasound were performed to evaluate the lower extremity deep venous systems from the level of the common femoral vein and including the common femoral, femoral, profunda femoral, popliteal and calf veins including the posterior tibial, peroneal and gastrocnemius veins when visible. The superficial great saphenous vein was also interrogated. Spectral Doppler was utilized to evaluate flow at rest and with distal augmentation maneuvers in the common femoral, femoral and popliteal veins. COMPARISON:  None. FINDINGS: RIGHT LOWER EXTREMITY Common Femoral Vein: No evidence of thrombus. Normal compressibility, respiratory phasicity and response to augmentation. Saphenofemoral Junction: No evidence of thrombus. Normal compressibility and flow on color Doppler imaging. Profunda Femoral Vein: No evidence of thrombus. Normal compressibility and flow on color Doppler imaging. Femoral Vein: No evidence of thrombus. Normal compressibility, respiratory phasicity and response to augmentation. Popliteal Vein: No evidence of thrombus. Normal compressibility, respiratory phasicity and response to augmentation. Calf Veins: The peroneal vein is not well visualized. No evidence of thrombus. Normal compressibility and flow on color Doppler imaging. Other Findings:  None. LEFT LOWER EXTREMITY Common Femoral Vein: No evidence of thrombus. Normal compressibility, respiratory phasicity and  response to augmentation. Saphenofemoral  Junction: No evidence of thrombus. Normal compressibility and flow on color Doppler imaging. Profunda Femoral Vein: No evidence of thrombus. Normal compressibility and flow on color Doppler imaging. Femoral Vein: No evidence of thrombus. Normal compressibility, respiratory phasicity and response to augmentation. Popliteal Vein: No evidence of thrombus. Normal compressibility, respiratory phasicity and response to augmentation. Calf Veins: The peroneal vein is not well visualized. No evidence of thrombus. Normal compressibility and flow on color Doppler imaging. Other Findings:  None. IMPRESSION: No evidence of bilateral lower extremity deep vein thrombosis. Ruthann Cancer, MD [facility] Electronically Signed   By: Ruthann Cancer MD   On: 09/13/2020 11:17   DG Chest Portable 1 View  Result Date: 09/12/2020 CLINICAL DATA:  Hypoxia EXAM: PORTABLE CHEST 1 VIEW COMPARISON:  07/20/2020 FINDINGS: Trace right and small left pleural effusion. Consolidation at the left lung base. Normal cardiomediastinal silhouette with aortic atherosclerosis. No pneumothorax. IMPRESSION: Trace right and small left pleural effusion with left basilar atelectasis or pneumonia. Electronically Signed   By: Donavan Foil M.D.   On: 09/12/2020 16:05               LOS: 1 day   Wilber Fini  [facility]   Pager on [email or web address]. If 7PM-7AM, please contact night-coverage at [email or web address]     09/13/2020, 1:52 PM

## 2020-09-14 DIAGNOSIS — E875 Hyperkalemia: Secondary | ICD-10-CM | POA: Diagnosis not present

## 2020-09-14 DIAGNOSIS — A419 Sepsis, unspecified organism: Secondary | ICD-10-CM | POA: Diagnosis not present

## 2020-09-14 DIAGNOSIS — N179 Acute kidney failure, unspecified: Secondary | ICD-10-CM | POA: Diagnosis not present

## 2020-09-14 DIAGNOSIS — E871 Hypo-osmolality and hyponatremia: Secondary | ICD-10-CM | POA: Diagnosis not present

## 2020-09-14 LAB — CBC WITH DIFFERENTIAL/PLATELET
Abs Immature Granulocytes: 0.75 10*3/uL — ABNORMAL HIGH (ref 0.00–0.07)
Basophils Absolute: 0.1 10*3/uL (ref 0.0–0.1)
Basophils Relative: 1 %
Eosinophils Absolute: 0 10*3/uL (ref 0.0–0.5)
Eosinophils Relative: 0 %
HCT: 31.2 % — ABNORMAL LOW (ref 39.0–52.0)
Hemoglobin: 10.1 g/dL — ABNORMAL LOW (ref 13.0–17.0)
Immature Granulocytes: 4 %
Lymphocytes Relative: 21 %
Lymphs Abs: 3.8 10*3/uL (ref 0.7–4.0)
MCH: 28.5 pg (ref 26.0–34.0)
MCHC: 32.4 g/dL (ref 30.0–36.0)
MCV: 87.9 fL (ref 80.0–100.0)
Monocytes Absolute: 4.2 10*3/uL — ABNORMAL HIGH (ref 0.1–1.0)
Monocytes Relative: 23 %
Neutro Abs: 9.1 10*3/uL — ABNORMAL HIGH (ref 1.7–7.7)
Neutrophils Relative %: 51 %
Platelets: 134 10*3/uL — ABNORMAL LOW (ref 150–400)
RBC: 3.55 MIL/uL — ABNORMAL LOW (ref 4.22–5.81)
RDW: 18.3 % — ABNORMAL HIGH (ref 11.5–15.5)
Smear Review: NORMAL
WBC: 18 10*3/uL — ABNORMAL HIGH (ref 4.0–10.5)
nRBC: 0 % (ref 0.0–0.2)

## 2020-09-14 LAB — BASIC METABOLIC PANEL
Anion gap: 7 (ref 5–15)
BUN: 21 mg/dL (ref 8–23)
CO2: 20 mmol/L — ABNORMAL LOW (ref 22–32)
Calcium: 7.1 mg/dL — ABNORMAL LOW (ref 8.9–10.3)
Chloride: 102 mmol/L (ref 98–111)
Creatinine, Ser: 1.05 mg/dL (ref 0.61–1.24)
GFR, Estimated: >60 mL/min (ref >60)
Glucose, Bld: 75 mg/dL (ref 70–99)
Potassium: 4.5 mmol/L (ref 3.5–5.1)
Sodium: 129 mmol/L — ABNORMAL LOW (ref 135–145)

## 2020-09-14 LAB — MAGNESIUM: Magnesium: 2.1 mg/dL (ref 1.7–2.4)

## 2020-09-14 LAB — PHOSPHORUS: Phosphorus: 3.3 mg/dL (ref 2.5–4.6)

## 2020-09-14 LAB — LACTIC ACID, PLASMA: Lactic Acid, Venous: 1.2 mmol/L (ref 0.5–1.9)

## 2020-09-14 MED ORDER — APIXABAN 5 MG PO TABS: 5.0000 mg | ORAL_TABLET | Freq: Two times a day (BID) | ORAL | Status: DC

## 2020-09-14 MED ORDER — APIXABAN 5 MG PO TABS
10.0000 mg | ORAL_TABLET | Freq: Two times a day (BID) | ORAL | Status: DC
  Administered 2020-09-14 (×2): 10 mg via ORAL
  Filled 2020-09-14 (×2): qty 2

## 2020-09-14 NOTE — Progress Notes (Addendum)
Progress Note    Dustin Howell  RKY:[id number] DOB: [birth year]  DOA: 09/12/2020 PCP: [name], MD      Brief Narrative:    Medical records reviewed and are as summarized below:  Dustin Howell is a 87 y.o. male  with medical history significant for history of C. difficile colitis, CAD s/p DES, lymphoproliferative disorder, recent lower GI bleed, recent C. difficile infection, hearing impairment, recent discharge from the hospital on 07/26/2020 after hospitalization for right hip fracture s/p IM nailing on 07/21/2020.  He had recently seen Dr. [name] on 09/10/2020 for recurrent diarrhea and he was restarted on oral vancomycin.  He presented to the hospital with frequent diarrhea.  He was admitted to the hospital for severe sepsis secondary to recurrent C. difficile colitis.  He was treated with antibiotics and IV fluids.  Was also found to have left-sided pulmonary embolism, hyponatremia and AKI complicated by hyperkalemia.    Assessment/Plan:   Principal Problem:   Severe sepsis (HCC) Active Problems:   CAD (coronary artery disease)   Protein-calorie malnutrition, severe   Recurrent colitis due to Clostridium difficile   AKI (acute kidney injury) ([place])   Hyponatremia   Hyperkalemia   Body mass index is 19.11 kg/m.    Severe sepsis due to recurrent C. difficile colitis, leukocytosis: Continue fidaxomicin from IV Flagyl and IV fluids.    Acute pulmonary embolism: Venous duplex of bilateral lower extremities did not show any evidence of DVT.  Management of an embolism was discussed again with patient and his daughter at the bedside.  Patient deferred the decision to his daughter.  After weighing the risk and benefits of treatment, Renee decided to proceed with Eliquis for long-term anticoagulation.  She said Eliquis will be discontinued if patient has any bleeding from this.  Acute respiratory failure with hypoxia: Resolved  AKI and hyperkalemia:  Improved  Hyponatremia: Improved  CAD s/p DES: Continue aspirin and Lipitor  Severe protein calorie malnutrition: Encourage adequate oral intake.  Continue feeding supplements  History of GI bleed: According to his daughter, patient almost died from rectal bleeding about 5 years ago.  He had another episode of GI bleed in December 2021.  Flexible sigmoidoscopy showed AVM and diverticula.           Diet Order            DIET - DYS 1 Room service appropriate? Yes; Fluid consistency: Thin  Diet effective now                    Consultants:  None  Procedures:  None    Medications:   . aspirin  81 mg Oral Daily  . atorvastatin  10 mg Oral Daily  . feeding supplement  237 mL Oral BID BM  . fidaxomicin  200 mg Oral BID  . heparin  5,000 Units Subcutaneous Q8H   Continuous Infusions: . lactated ringers 50 mL/hr at 09/13/20 1418  . metronidazole 500 mg (09/14/20 1017)     Anti-infectives (From admission, onward)   Start     Dose/Rate Route Frequency Ordered Stop   11/08/20 1000  vancomycin (VANCOCIN) 50 mg/mL oral solution 125 mg  Status:  Discontinued       "Followed by" Linked Group Details   125 mg Oral Every 3 DAYS 09/12/20 2017 09/13/20 0955   10/11/20 1000  vancomycin (VANCOCIN) 50 mg/mL oral solution 125 mg  Status:  Discontinued       "Followed by"  Linked Group Details   125 mg Oral Every other day 09/12/20 2017 09/13/20 0955   10/04/20 1000  vancomycin (VANCOCIN) 50 mg/mL oral solution 125 mg  Status:  Discontinued       "Followed by" Linked Group Details   125 mg Oral Daily 09/12/20 2017 09/13/20 0955   09/27/20 1000  vancomycin (VANCOCIN) 50 mg/mL oral solution 125 mg  Status:  Discontinued       "Followed by" Linked Group Details   125 mg Oral 2 times daily 09/12/20 2017 09/13/20 0955   09/13/20 1200  fidaxomicin (DIFICID) tablet 200 mg        200 mg Oral 2 times daily 09/13/20 0955 09/23/20 0959   09/13/20 0000  metroNIDAZOLE (FLAGYL) IVPB  500 mg        500 mg 100 mL/hr over 60 Minutes Intravenous Every 8 hours 09/12/20 2017     09/12/20 2200  vancomycin (VANCOCIN) 50 mg/mL oral solution 125 mg  Status:  Discontinued       "Followed by" Linked Group Details   125 mg Oral 4 times daily 09/12/20 2017 09/13/20 0955   09/12/20 1545  ceFEPIme (MAXIPIME) 2 g in sodium chloride 0.9 % 100 mL IVPB        2 g 200 mL/hr over 30 Minutes Intravenous  Once 09/12/20 1537 09/12/20 1558   09/12/20 1545  metroNIDAZOLE (FLAGYL) IVPB 500 mg        500 mg 100 mL/hr over 60 Minutes Intravenous  Once 09/12/20 1537 09/12/20 1639             Family Communication/Anticipated D/C date and plan/Code Status   DVT prophylaxis: heparin injection 5,000 Units Start: 09/12/20 2200     Code Status: DNR  Family Communication: Discussed with his daughter, Ms. [place] Disposition Plan:    Status is: Inpatient  Remains inpatient appropriate because:IV treatments appropriate due to intensity of illness or inability to take PO   Dispo: The patient is from: Home              Anticipated d/c is to: Home              Patient currently is not medically stable to d/c.   Difficult to place patient No           Subjective:   Interval events noted.  He complains of diarrhea.  Fluid chart shows that patient had 3 type VII stools overnight.  His daughter, Joseph Art, is at the bedside  Objective:    Vitals:   09/13/20 1218 09/13/20 2028 09/14/20 0226 09/14/20 0811  BP: 129/74 118/79 124/76 112/62  Pulse: 96 96 94 91  Resp: 14 16 16 16   Temp: 97.8 F (36.6 C) 98.5 F (36.9 C) 98.2 F (36.8 C) (!) 97.5 F (36.4 C)  TempSrc: Oral Oral    SpO2: 99% 96% 96% 96%  Weight:      Height:       No data found.   Intake/Output Summary (Last 24 hours) at 09/14/2020 1049 Last data filed at 09/14/2020 0600 Gross per 24 hour  Intake 638.13 ml  Output 375 ml  Net 263.13 ml   Filed Weights   09/12/20 1454 09/12/20 2110 09/13/20 0500  Weight: 45.4  kg 52.6 kg 52.1 kg    Exam:  GEN: NAD SKIN: No rash EYES: EOMI ENT: MMM CV: RRR PULM: CTA B ABD: soft, ND, NT, +BS CNS: AAO x 3, non focal EXT: Bilateral leg edema, no tenderness or  erythema         Data Reviewed:   I have personally reviewed following labs and imaging studies:  Labs: Labs show the following:   Basic Metabolic Panel: Recent Labs  Lab 09/12/20 1521 09/13/20 0525 09/14/20 0535  NA 126* 132* 129*  K 5.6* 5.0 4.5  CL 94* 104 102  CO2 20* 22 20*  GLUCOSE 198* 70 75  BUN 35* 27* 21  CREATININE 1.48* 1.05 1.05  CALCIUM 7.6* 7.0* 7.1*  MG  --  2.3 2.1  PHOS  --   --  3.3   GFR Estimated Creatinine Clearance: 31.7 mL/min (by C-G formula based on SCr of 1.05 mg/dL). Liver Function Tests: Recent Labs  Lab 09/12/20 1521 09/13/20 0525  AST 37 26  ALT 17 12  ALKPHOS 223* 170*  BILITOT 1.1 1.0  PROT 5.5* 4.2*  ALBUMIN 2.0* 1.6*   No results for input(s): LIPASE, AMYLASE in the last 168 hours. No results for input(s): AMMONIA in the last 168 hours. Coagulation profile Recent Labs  Lab 09/12/20 1521  INR 1.0    CBC: Recent Labs  Lab 09/12/20 1521 09/13/20 0525 09/14/20 0535  WBC 36.0* 22.6* 18.0*  NEUTROABS 33.8*  --  9.1*  HGB 12.3* 11.0* 10.1*  HCT 38.8* 33.6* 31.2*  MCV 88.6 87.7 87.9  PLT 185 139* 134*   Cardiac Enzymes: No results for input(s): CKTOTAL, CKMB, CKMBINDEX, TROPONINI in the last 168 hours. BNP (last 3 results) No results for input(s): PROBNP in the last 8760 hours. CBG: Recent Labs  Lab 09/13/20 0647 09/13/20 0658 09/13/20 0700 09/13/20 0705 09/13/20 0728  GLUCAP 52* 55* 65* 70 74   D-Dimer: No results for input(s): DDIMER in the last 72 hours. Hgb A1c: No results for input(s): HGBA1C in the last 72 hours. Lipid Profile: No results for input(s): CHOL, HDL, LDLCALC, TRIG, CHOLHDL, LDLDIRECT in the last 72 hours. Thyroid function studies: No results for input(s): TSH, T4TOTAL, T3FREE, THYROIDAB in  the last 72 hours.  Invalid input(s): FREET3 Anemia work up: No results for input(s): VITAMINB12, FOLATE, FERRITIN, TIBC, IRON, RETICCTPCT in the last 72 hours. Sepsis Labs: Recent Labs  Lab 09/12/20 1521 09/13/20 0525 09/14/20 0535  PROCALCITON  --  1.94  --   WBC 36.0* 22.6* 18.0*  LATICACIDVEN 2.8*  4.1*  --  1.2    Microbiology Recent Results (from the past 240 hour(s))  Culture, blood (Routine x 2)     Status: None (Preliminary result)   Collection Time: 09/12/20  3:21 PM   Specimen: BLOOD  Result Value Ref Range Status   Specimen Description BLOOD RIGHT ANTECUBITAL  Final   Special Requests   Final    BOTTLES DRAWN AEROBIC AND ANAEROBIC Blood Culture adequate volume   Culture   Final    NO GROWTH 2 DAYS Performed at [facility], [address] [place], [place] [address]    Report Status PENDING  Incomplete  Culture, blood (Routine x 2)     Status: None (Preliminary result)   Collection Time: 09/12/20  3:21 PM   Specimen: BLOOD  Result Value Ref Range Status   Specimen Description BLOOD LEFT ANTECUBITAL  Final   Special Requests   Final    BOTTLES DRAWN AEROBIC AND ANAEROBIC Blood Culture adequate volume   Culture   Final    NO GROWTH 2 DAYS Performed at [facility], [address], [place], [place] [address]    Report Status PENDING  Incomplete  Resp Panel  by RT-PCR (Flu A&B, Covid) Nasopharyngeal Swab     Status: None   Collection Time: 09/12/20  3:21 PM   Specimen: Nasopharyngeal Swab; Nasopharyngeal(NP) swabs in vial transport medium  Result Value Ref Range Status   SARS Coronavirus 2 by RT PCR NEGATIVE NEGATIVE Final    Comment: (NOTE) SARS-CoV-2 target nucleic acids are NOT DETECTED.  The SARS-CoV-2 RNA is generally detectable in upper respiratory specimens during the acute phase of infection. The lowest concentration of SARS-CoV-2 viral copies this assay can detect is 138 copies/mL. A negative result does not preclude  SARS-Cov-2 infection and should not be used as the sole basis for treatment or other patient management decisions. A negative result may occur with  improper specimen collection/handling, submission of specimen other than nasopharyngeal swab, presence of viral mutation(s) within the areas targeted by this assay, and inadequate number of viral copies(<138 copies/mL). A negative result must be combined with clinical observations, patient history, and epidemiological information. The expected result is Negative.  Fact Sheet for Patients:  EntrepreneurPulse.com.au  Fact Sheet for Healthcare Providers:  IncredibleEmployment.be  This test is no t yet approved or cleared by the [place] FDA and  has been authorized for detection and/or diagnosis of SARS-CoV-2 by FDA under an Emergency Use Authorization (EUA). This EUA will remain  in effect (meaning this test can be used) for the duration of the COVID-19 declaration under Section 564(b)(1) of the Act, 21 U.S.C.section 360bbb-3(b)(1), unless the authorization is terminated  or revoked sooner.       Influenza A by PCR NEGATIVE NEGATIVE Final   Influenza B by PCR NEGATIVE NEGATIVE Final    Comment: (NOTE) The Xpert Xpress SARS-CoV-2/FLU/RSV plus assay is intended as an aid in the diagnosis of influenza from Nasopharyngeal swab specimens and should not be used as a sole basis for treatment. Nasal washings and aspirates are unacceptable for Xpert Xpress SARS-CoV-2/FLU/RSV testing.  Fact Sheet for Patients: EntrepreneurPulse.com.au  Fact Sheet for Healthcare Providers: IncredibleEmployment.be  This test is not yet approved or cleared by the [place] FDA and has been authorized for detection and/or diagnosis of SARS-CoV-2 by FDA under an Emergency Use Authorization (EUA). This EUA will remain in effect (meaning this test can be used) for the duration of  the COVID-19 declaration under Section 564(b)(1) of the Act, 21 U.S.C. section 360bbb-3(b)(1), unless the authorization is terminated or revoked.  Performed at [facility], [address] [place], [place] [address]   Gastrointestinal Panel by PCR , Stool     Status: None   Collection Time: 09/12/20  3:21 PM   Specimen: STOOL  Result Value Ref Range Status   Campylobacter species NOT DETECTED NOT DETECTED Final   Plesimonas shigelloides NOT DETECTED NOT DETECTED Final   Salmonella species NOT DETECTED NOT DETECTED Final   Yersinia enterocolitica NOT DETECTED NOT DETECTED Final   Vibrio species NOT DETECTED NOT DETECTED Final   Vibrio cholerae NOT DETECTED NOT DETECTED Final   Enteroaggregative E coli (EAEC) NOT DETECTED NOT DETECTED Final   Enteropathogenic E coli (EPEC) NOT DETECTED NOT DETECTED Final   Enterotoxigenic E coli (ETEC) NOT DETECTED NOT DETECTED Final   Shiga like toxin producing E coli (STEC) NOT DETECTED NOT DETECTED Final   Shigella/Enteroinvasive E coli (EIEC) NOT DETECTED NOT DETECTED Final   Cryptosporidium NOT DETECTED NOT DETECTED Final   Cyclospora cayetanensis NOT DETECTED NOT DETECTED Final   Entamoeba histolytica NOT DETECTED NOT DETECTED Final   Giardia lamblia NOT DETECTED NOT DETECTED  Final   Adenovirus F40/41 NOT DETECTED NOT DETECTED Final   Astrovirus NOT DETECTED NOT DETECTED Final   Norovirus GI/GII NOT DETECTED NOT DETECTED Final   Rotavirus A NOT DETECTED NOT DETECTED Final   Sapovirus (I, II, IV, and V) NOT DETECTED NOT DETECTED Final    Comment: Performed at [facility], [address] [place], [place] [address]  C Difficile Quick Screen w PCR reflex     Status: Abnormal   Collection Time: 09/12/20  3:21 PM   Specimen: STOOL  Result Value Ref Range Status   C Diff antigen POSITIVE (A) NEGATIVE Final   C Diff toxin POSITIVE (A) NEGATIVE Final   C Diff interpretation Toxin producing C. difficile detected.  Final     Comment: CRITICAL RESULT CALLED TO, READ BACK BY AND VERIFIED WITH: RAQUEL DAVID @2057  ON 09/12/20 SKL Performed at [facility], [address] [place], [place] [address]     Procedures and diagnostic studies:  CT Angio Chest PE W and/or Wo Contrast  Result Date: 09/12/2020 CLINICAL DATA:  Shortness of breath EXAM: CT ANGIOGRAPHY CHEST WITH CONTRAST TECHNIQUE: Multidetector CT imaging of the chest was performed using the standard protocol during bolus administration of intravenous contrast. Multiplanar CT image reconstructions and MIPs were obtained to evaluate the vascular anatomy. CONTRAST:  7mL OMNIPAQUE IOHEXOL 350 MG/ML SOLN COMPARISON:  Chest x-ray 09/12/2020, CT chest 11/26/2008, CT angiography 06/21/2019 FINDINGS: Cardiovascular: Satisfactory opacification of the pulmonary arteries to the segmental level. Small nonocclusive thrombus within left lower lobe distal segmental/subsegmental vessel. No other discrete filling defects are visualized. Moderate aortic atherosclerosis without dissection. Coronary vascular calcification. Normal cardiac size. Trace pericardial effusion. 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 Stable mild aneurysmal dilatation of the proximal infrarenal abdominal aorta up to 3.1 cm, penetrating aortic ulcer or ulcerated plaque at this level measuring approximately 1 cm. 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 Mediastinum/Nodes: Midline trachea. No thyroid mass. No suspicious adenopathy. Esophagus within normal limits. Lungs/Pleura: Small bilateral pleural effusions with dependent atelectasis. Mild subpleural fibrosis. Scarring and fibrosis at the apices. Upper Abdomen: No acute abnormality. Small gallstones. Small amount of free fluid within the upper abdomen. Musculoskeletal: Chronic sternal fracture Review of the MIP images confirms the above findings. IMPRESSION: 1. Small nonocclusive thrombus within the  left lower lobe distal segmental/subsegmental vessels. 2. Small bilateral pleural effusions with dependent atelectasis. 3. 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 Stable mild aneurysmal dilatation of the proximal infrarenal abdominal aorta up to 3.1 cm, penetrating aortic ulcer or ulcerated plaque at this level measuring approximately 1 cm. 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 Recommend follow-up ultrasound every 3 years. This recommendation follows ACR consensus guidelines: White Paper of the ACR Incidental Findings Committee II on Vascular Findings. J Am Coll Radiol 2013; 10:789-794. 4. Gallstones. 5. Small amount of free fluid within the upper abdomen. 6. Aortic atherosclerosis. Critical Value/emergent results were called by telephone at the time of interpretation on 09/12/2020 at 5:36 pm to provider PHILLIP STAFFORD , who verbally acknowledged these results. Aortic Atherosclerosis (ICD10-I70.0). Electronically Signed   By: Donavan Foil M.D.   On: 09/12/2020 17:37   CT ABDOMEN PELVIS W CONTRAST  Result Date: 09/12/2020 CLINICAL DATA:  Abdominal distension EXAM: CT ABDOMEN AND PELVIS WITH CONTRAST TECHNIQUE: Multidetector CT imaging of the abdomen and pelvis was performed using the standard protocol following bolus administration of intravenous contrast. CONTRAST:  38mL OMNIPAQUE IOHEXOL  350 MG/ML SOLN COMPARISON:  CT 06/20/2020 FINDINGS: Lower chest: Lung bases demonstrate small bilateral pleural effusions and dependent atelectasis. Coronary vascular calcification. Normal cardiac size. Hepatobiliary: Small gallstones. No biliary dilatation. No focal hepatic abnormality Pancreas: Unremarkable. No pancreatic ductal dilatation or surrounding inflammatory changes. Spleen: Peripheral slightly wedge-shaped hypodensities within the posterior spleen are indeterminate for small infarcts. Adrenals/Urinary Tract: Adrenal glands are normal. 6.6 cm cyst exophytic to the upper left  kidney. Small cyst lower pole left kidney. No hydronephrosis. The bladder is normal Stomach/Bowel: The stomach is nonenlarged. No dilated small bowel. Marked diffuse wall thickening of the entire colon with mucosal enhancement. Grossly negative appendix. Diverticular disease of the sigmoid colon. Vascular/Lymphatic: Extensive aortic atherosclerosis. Penetrating aortic ulcers or ulcerated plaque at the supra and infrarenal abdominal aorta described on chest CT. Mild aneurysmal dilatation of the infrarenal abdominal aorta measuring up to 3.1 cm. 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 No suspicious nodes Reproductive: Prostate is unremarkable. Other: No free air. Small quantity of free fluid within the abdomen and pelvis. Generalized subcutaneous edema consistent with anasarca Musculoskeletal: Status post right femoral intramedullary rodding for comminuted intertrochanteric fracture. IMPRESSION: 1. Marked diffuse wall thickening of the entire colon with mucosal enhancement, consistent with pancolitis of infectious, inflammatory, or ischemic etiology. This likely corresponds to the history of C difficile colitis. There is no perforation or abscess. 2. Small quantity of free fluid within the abdomen and pelvis. Generalized subcutaneous edema consistent with anasarca. 3. Peripheral slightly wedge-shaped hypodensities within the posterior spleen, indeterminate for small infarcts. 4. Extensive aortic atherosclerosis with penetrating aortic ulcers or ulcerated plaque at the supra and infrarenal abdominal aorta without significant change since 06/20/2020. Mild aneurysmal dilatation of the infrarenal abdominal aorta measuring up to 3.1 cm. Recommend follow-up ultrasound every 3 years. This recommendation follows ACR consensus guidelines: White Paper of the ACR Incidental Findings Committee II on Vascular Findings. J Am Coll Radiol 2013; 10:789-794. 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 5. Small  bilateral pleural effusions and dependent atelectasis. 6. Cholelithiasis. 7. Status post right femoral intramedullary rodding for comminuted intertrochanteric fracture. Critical Value/emergent results were called by telephone at the time of interpretation on 09/12/2020 at 5:36 pm to provider PHILLIP STAFFORD , who verbally acknowledged these results. Aortic Atherosclerosis (ICD10-I70.0). Electronically Signed   By: Donavan Foil M.D.   On: 09/12/2020 17:36   US Venous Img Lower Bilateral (DVT)  Result Date: 09/13/2020 CLINICAL DATA:  87 year old male with bilateral lower extremity leg swelling, pulmonary embolism. EXAM: BILATERAL LOWER EXTREMITY VENOUS DOPPLER ULTRASOUND TECHNIQUE: Gray-scale sonography with graded compression, as well as color Doppler and duplex ultrasound were performed to evaluate the lower extremity deep venous systems from the level of the common femoral vein and including the common femoral, femoral, profunda femoral, popliteal and calf veins including the posterior tibial, peroneal and gastrocnemius veins when visible. The superficial great saphenous vein was also interrogated. Spectral Doppler was utilized to evaluate flow at rest and with distal augmentation maneuvers in the common femoral, femoral and popliteal veins. COMPARISON:  None. FINDINGS: RIGHT LOWER EXTREMITY Common Femoral Vein: No evidence of thrombus. Normal compressibility, respiratory phasicity and response to augmentation. Saphenofemoral Junction: No evidence of thrombus. Normal compressibility and flow on color Doppler imaging. Profunda Femoral Vein: No evidence of thrombus. Normal compressibility and flow on color Doppler imaging. Femoral Vein: No evidence of thrombus. Normal compressibility, respiratory phasicity and response to augmentation. Popliteal Vein: No evidence of thrombus. Normal compressibility, respiratory phasicity and response  to augmentation. Calf Veins: The peroneal vein is not well visualized. No  evidence of thrombus. Normal compressibility and flow on color Doppler imaging. Other Findings:  None. LEFT LOWER EXTREMITY Common Femoral Vein: No evidence of thrombus. Normal compressibility, respiratory phasicity and response to augmentation. Saphenofemoral Junction: No evidence of thrombus. Normal compressibility and flow on color Doppler imaging. Profunda Femoral Vein: No evidence of thrombus. Normal compressibility and flow on color Doppler imaging. Femoral Vein: No evidence of thrombus. Normal compressibility, respiratory phasicity and response to augmentation. Popliteal Vein: No evidence of thrombus. Normal compressibility, respiratory phasicity and response to augmentation. Calf Veins: The peroneal vein is not well visualized. No evidence of thrombus. Normal compressibility and flow on color Doppler imaging. Other Findings:  None. IMPRESSION: No evidence of bilateral lower extremity deep vein thrombosis. Ruthann Cancer, MD [facility] Electronically Signed   By: Ruthann Cancer MD   On: 09/13/2020 11:17   DG Chest Portable 1 View  Result Date: 09/12/2020 CLINICAL DATA:  Hypoxia EXAM: PORTABLE CHEST 1 VIEW COMPARISON:  07/20/2020 FINDINGS: Trace right and small left pleural effusion. Consolidation at the left lung base. Normal cardiomediastinal silhouette with aortic atherosclerosis. No pneumothorax. IMPRESSION: Trace right and small left pleural effusion with left basilar atelectasis or pneumonia. Electronically Signed   By: Donavan Foil M.D.   On: 09/12/2020 16:05               LOS: 2 days   Hydie Langan  [facility]   Pager on [email or web address]. If 7PM-7AM, please contact night-coverage at [email or web address]     09/14/2020, 10:49 AM

## 2020-09-14 NOTE — Progress Notes (Signed)
[facility] for apixaban Indication: pulmonary embolus  No Known Allergies  Patient Measurements: Height: 5\' 5"  (165.1 cm) Weight: 52.1 kg (114 lb 13.8 oz) IBW/kg (Calculated) : 61.5  Vital Signs: Temp: 97.5 F (36.4 C) (03/05 0811) BP: 112/62 (03/05 0811) Pulse Rate: 91 (03/05 0811)  Labs: Recent Labs    09/12/20 1521 09/13/20 0525 09/14/20 0535  HGB 12.3* 11.0* 10.1*  HCT 38.8* 33.6* 31.2*  PLT 185 139* 134*  LABPROT 13.1  --   --   INR 1.0  --   --   CREATININE 1.48* 1.05 1.05    Estimated Creatinine Clearance: 31.7 mL/min (by C-G formula based on SCr of 1.05 mg/dL).   Medical History: Past Medical History:  Diagnosis Date  . GERD (gastroesophageal reflux disease)   . GI bleed 04/2016   presumed diverticular source, treated with embolization by IR  . HOH (hard of hearing)    right sided hearing aid  . Iron deficiency anemia 03/2018  . 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  low grade monocytosis, thrombocytopenia: likely low-grade lymphproliferative disorder.      Medications:  Scheduled:  . aspirin  81 mg Oral Daily  . atorvastatin  10 mg Oral Daily  . feeding supplement  237 mL Oral BID BM  . fidaxomicin  200 mg Oral BID  . heparin  5,000 Units Subcutaneous Q8H    Assessment:  87 y.o. male sent to the ED today due to abnormal labs and having diarrhea for the past month or so, currently on his second course of oral vancomycin,  having persistent generalized weakness, no vomiting. CT angio reveals small nonocclusive thrombus within the left lower lobe distal segmental/subsegmental vessels. H&H, platelets noted to be trending down   Goal of Therapy:  Monitor platelets by anticoagulation protocol: Yes   Plan:    start apixaban 10 mg twice daily for 7 days followed by 5 mg twice daily  CBC at least every 3 days per protocol  Dallie Piles 09/14/2020,11:31 AM

## 2020-09-15 DIAGNOSIS — E43 Unspecified severe protein-calorie malnutrition: Secondary | ICD-10-CM

## 2020-09-15 DIAGNOSIS — K922 Gastrointestinal hemorrhage, unspecified: Secondary | ICD-10-CM

## 2020-09-15 DIAGNOSIS — A419 Sepsis, unspecified organism: Secondary | ICD-10-CM | POA: Diagnosis not present

## 2020-09-15 DIAGNOSIS — A0471 Enterocolitis due to Clostridium difficile, recurrent: Secondary | ICD-10-CM | POA: Diagnosis not present

## 2020-09-15 DIAGNOSIS — E871 Hypo-osmolality and hyponatremia: Secondary | ICD-10-CM | POA: Diagnosis not present

## 2020-09-15 LAB — BASIC METABOLIC PANEL
Anion gap: 4 — ABNORMAL LOW (ref 5–15)
BUN: 24 mg/dL — ABNORMAL HIGH (ref 8–23)
CO2: 22 mmol/L (ref 22–32)
Calcium: 7.1 mg/dL — ABNORMAL LOW (ref 8.9–10.3)
Chloride: 103 mmol/L (ref 98–111)
Creatinine, Ser: 0.87 mg/dL (ref 0.61–1.24)
GFR, Estimated: >60 mL/min (ref >60)
Glucose, Bld: 84 mg/dL (ref 70–99)
Potassium: 4.4 mmol/L (ref 3.5–5.1)
Sodium: 129 mmol/L — ABNORMAL LOW (ref 135–145)

## 2020-09-15 LAB — CBC WITH DIFFERENTIAL/PLATELET
Abs Immature Granulocytes: 1.52 10*3/uL — ABNORMAL HIGH (ref 0.00–0.07)
Basophils Absolute: 0.1 10*3/uL (ref 0.0–0.1)
Basophils Relative: 1 %
Eosinophils Absolute: 0 10*3/uL (ref 0.0–0.5)
Eosinophils Relative: 0 %
HCT: 29.7 % — ABNORMAL LOW (ref 39.0–52.0)
Hemoglobin: 10.1 g/dL — ABNORMAL LOW (ref 13.0–17.0)
Immature Granulocytes: 10 %
Lymphocytes Relative: 27 %
Lymphs Abs: 4.1 10*3/uL — ABNORMAL HIGH (ref 0.7–4.0)
MCH: 29.3 pg (ref 26.0–34.0)
MCHC: 34 g/dL (ref 30.0–36.0)
MCV: 86.1 fL (ref 80.0–100.0)
Monocytes Absolute: 4 10*3/uL — ABNORMAL HIGH (ref 0.1–1.0)
Monocytes Relative: 26 %
Neutro Abs: 5.6 10*3/uL (ref 1.7–7.7)
Neutrophils Relative %: 36 %
Platelets: 136 10*3/uL — ABNORMAL LOW (ref 150–400)
RBC: 3.45 MIL/uL — ABNORMAL LOW (ref 4.22–5.81)
RDW: 18.4 % — ABNORMAL HIGH (ref 11.5–15.5)
Smear Review: NORMAL
WBC: 15.3 10*3/uL — ABNORMAL HIGH (ref 4.0–10.5)
nRBC: 0.2 % (ref 0.0–0.2)

## 2020-09-15 LAB — HEMOGLOBIN AND HEMATOCRIT, BLOOD
HCT: 32.5 % — ABNORMAL LOW (ref 39.0–52.0)
Hemoglobin: 10.8 g/dL — ABNORMAL LOW (ref 13.0–17.0)

## 2020-09-15 NOTE — Progress Notes (Addendum)
Progress Note    Dustin Howell  EXB:[id number] DOB: 1925-05-18  DOA: 09/12/2020 PCP: [name], MD      Brief Narrative:    Medical records reviewed and are as summarized below:  Dustin Howell is a 87 y.o. male  with medical history significant for history of C. difficile colitis, CAD s/p DES, lymphoproliferative disorder, recent lower GI bleed, recent C. difficile infection, hearing impairment, recent discharge from the hospital on 07/26/2020 after hospitalization for right hip fracture s/p IM nailing on 07/21/2020.  He had recently seen Dr. [name] on 09/10/2020 for recurrent diarrhea and he was restarted on oral vancomycin.  He presented to the hospital with frequent diarrhea.  He was admitted to the hospital for severe sepsis secondary to recurrent C. difficile colitis.  He was treated with fidaxomicin, IV Flagyl and IV fluids. He was also found to have left-sided pulmonary embolism, hyponatremia and AKI complicated by hyperkalemia.  After thorough discussion with patient and family regarding management of pulmonary embolism, they decided to proceed with treatment with Eliquis.  However, patient developed acute GI bleeding after 2 doses of Eliquis.  Aspirin and Eliquis were discontinued.  Patient and her daughter declined IVC filter.    Assessment/Plan:   Principal Problem:   Severe sepsis (HCC) Active Problems:   Acute lower GI bleeding   CAD (coronary artery disease)   Protein-calorie malnutrition, severe   Recurrent colitis due to Clostridium difficile   AKI (acute kidney injury) ([place])   Hyponatremia   Hyperkalemia   Body mass index is 19.11 kg/m.    Severe sepsis due to recurrent C. difficile colitis, leukocytosis: Continue fidaxomicin, Flagyl and IV fluids.  Acute pulmonary embolism: Venous duplex of bilateral lower extremities did not show any evidence of DVT.  Discontinue Eliquis because of acute GI bleeding.  Patient and his daughter are not interested in IVC  filter.  This was discussed with his daughter, [name].  She agrees with the plan.  Acute GI bleeding/rectal bleeding, history of GI bleed: Eliquis has been discontinued.  Continue IV fluids.  Monitor H&H.  Flex sig showed AVM and diverticula in December 2021.  According to his daughter, he almost died from rectal bleeding about 5 years ago.  Acute respiratory failure with hypoxia: Resolved  Hyponatremia: Continue IV fluids  CAD s/p DES: Discontinue aspirin because of rectal bleeding  Severe protein calorie malnutrition, peripheral edema, hypoalbuminemia: Encourage adequate oral intake.  Continue feeding supplements  AKI, hyperkalemia and acute hypoxic respiratory failure: Resolved  S/p right hip IM nailing on 07/21/2020.       Diet Order            DIET - DYS 1 Room service appropriate? Yes; Fluid consistency: Thin  Diet effective now                    Consultants:  None  Procedures:  None    Medications:   . atorvastatin  10 mg Oral Daily  . feeding supplement  237 mL Oral BID BM  . fidaxomicin  200 mg Oral BID   Continuous Infusions: . lactated ringers 50 mL/hr at 09/14/20 1152  . metronidazole 500 mg (09/15/20 0851)     Anti-infectives (From admission, onward)   Start     Dose/Rate Route Frequency Ordered Stop   11/08/20 1000  vancomycin (VANCOCIN) 50 mg/mL oral solution 125 mg  Status:  Discontinued       "Followed by" Linked Group Details  125 mg Oral Every 3 DAYS 09/12/20 2017 09/13/20 0955   10/11/20 1000  vancomycin (VANCOCIN) 50 mg/mL oral solution 125 mg  Status:  Discontinued       "Followed by" Linked Group Details   125 mg Oral Every other day 09/12/20 2017 09/13/20 0955   10/04/20 1000  vancomycin (VANCOCIN) 50 mg/mL oral solution 125 mg  Status:  Discontinued       "Followed by" Linked Group Details   125 mg Oral Daily 09/12/20 2017 09/13/20 0955   09/27/20 1000  vancomycin (VANCOCIN) 50 mg/mL oral solution 125 mg  Status:   Discontinued       "Followed by" Linked Group Details   125 mg Oral 2 times daily 09/12/20 2017 09/13/20 0955   09/13/20 1200  fidaxomicin (DIFICID) tablet 200 mg        200 mg Oral 2 times daily 09/13/20 0955 09/23/20 0959   09/13/20 0000  metroNIDAZOLE (FLAGYL) IVPB 500 mg        500 mg 100 mL/hr over 60 Minutes Intravenous Every 8 hours 09/12/20 2017     09/12/20 2200  vancomycin (VANCOCIN) 50 mg/mL oral solution 125 mg  Status:  Discontinued       "Followed by" Linked Group Details   125 mg Oral 4 times daily 09/12/20 2017 09/13/20 0955   09/12/20 1545  ceFEPIme (MAXIPIME) 2 g in sodium chloride 0.9 % 100 mL IVPB        2 g 200 mL/hr over 30 Minutes Intravenous  Once 09/12/20 1537 09/12/20 1558   09/12/20 1545  metroNIDAZOLE (FLAGYL) IVPB 500 mg        500 mg 100 mL/hr over 60 Minutes Intravenous  Once 09/12/20 1537 09/12/20 1639             Family Communication/Anticipated D/C date and plan/Code Status   DVT prophylaxis:      Code Status: DNR  Family Communication: Discussed with his daughter, Ms. Tindall Disposition Plan:    Status is: Inpatient  Remains inpatient appropriate because:IV treatments appropriate due to intensity of illness or inability to take PO   Dispo: The patient is from: Home              Anticipated d/c is to: Home              Patient currently is not medically stable to d/c.   Difficult to place patient No           Subjective:   Interval events noted.  Patient is having bloody diarrhea.  His nurse, Rodman Key, reported that he had several blood clots in his last bowel movement.  No abdominal pain.  Objective:    Vitals:   09/14/20 2121 09/15/20 0016 09/15/20 0356 09/15/20 0833  BP: 128/68 119/71 121/69 107/66  Pulse: 99 95 93 96  Resp: 16 16 16 16   Temp: 98.1 F (36.7 C) 98.1 F (36.7 C) 97.9 F (36.6 C) 98.5 F (36.9 C)  TempSrc: Oral   Oral  SpO2: 97% 96% 99% 97%  Weight:      Height:       No data found.  No  intake or output data in the 24 hours ending 09/15/20 1053 Filed Weights   09/12/20 1454 09/12/20 2110 09/13/20 0500  Weight: 45.4 kg 52.6 kg 52.1 kg    Exam:  GEN: NAD SKIN: No ras EYES: EOMI ENT: MMM CV: RRR PULM: CTA B ABD: soft, ND, NT, +BS CNS: AAO x 3, non  focal EXT: Bilateral leg edema.  No tenderness or erythema.         Data Reviewed:   I have personally reviewed following labs and imaging studies:  Labs: Labs show the following:   Basic Metabolic Panel: Recent Labs  Lab 09/12/20 1521 09/13/20 0525 09/14/20 0535 09/15/20 0525  NA 126* 132* 129* 129*  K 5.6* 5.0 4.5 4.4  CL 94* 104 102 103  CO2 20* 22 20* 22  GLUCOSE 198* 70 75 84  BUN 35* 27* 21 24*  CREATININE 1.48* 1.05 1.05 0.87  CALCIUM 7.6* 7.0* 7.1* 7.1*  MG  --  2.3 2.1  --   PHOS  --   --  3.3  --    GFR Estimated Creatinine Clearance: 38.3 mL/min (by C-G formula based on SCr of 0.87 mg/dL). Liver Function Tests: Recent Labs  Lab 09/12/20 1521 09/13/20 0525  AST 37 26  ALT 17 12  ALKPHOS 223* 170*  BILITOT 1.1 1.0  PROT 5.5* 4.2*  ALBUMIN 2.0* 1.6*   No results for input(s): LIPASE, AMYLASE in the last 168 hours. No results for input(s): AMMONIA in the last 168 hours. Coagulation profile Recent Labs  Lab 09/12/20 1521  INR 1.0    CBC: Recent Labs  Lab 09/12/20 1521 09/13/20 0525 09/14/20 0535 09/15/20 0525  WBC 36.0* 22.6* 18.0* 15.3*  NEUTROABS 33.8*  --  9.1* 5.6  HGB 12.3* 11.0* 10.1* 10.1*  HCT 38.8* 33.6* 31.2* 29.7*  MCV 88.6 87.7 87.9 86.1  PLT 185 139* 134* 136*   Cardiac Enzymes: No results for input(s): CKTOTAL, CKMB, CKMBINDEX, TROPONINI in the last 168 hours. BNP (last 3 results) No results for input(s): PROBNP in the last 8760 hours. CBG: Recent Labs  Lab 09/13/20 0647 09/13/20 0658 09/13/20 0700 09/13/20 0705 09/13/20 0728  GLUCAP 52* 55* 65* 70 74   D-Dimer: No results for input(s): DDIMER in the last 72 hours. Hgb A1c: No results  for input(s): HGBA1C in the last 72 hours. Lipid Profile: No results for input(s): CHOL, HDL, LDLCALC, TRIG, CHOLHDL, LDLDIRECT in the last 72 hours. Thyroid function studies: No results for input(s): TSH, T4TOTAL, T3FREE, THYROIDAB in the last 72 hours.  Invalid input(s): FREET3 Anemia work up: No results for input(s): VITAMINB12, FOLATE, FERRITIN, TIBC, IRON, RETICCTPCT in the last 72 hours. Sepsis Labs: Recent Labs  Lab 09/12/20 1521 09/13/20 0525 09/14/20 0535 09/15/20 0525  PROCALCITON  --  1.94  --   --   WBC 36.0* 22.6* 18.0* 15.3*  LATICACIDVEN 2.8*  4.1*  --  1.2  --     Microbiology Recent Results (from the past 240 hour(s))  Culture, blood (Routine x 2)     Status: None (Preliminary result)   Collection Time: 09/12/20  3:21 PM   Specimen: BLOOD  Result Value Ref Range Status   Specimen Description BLOOD RIGHT ANTECUBITAL  Final   Special Requests   Final    BOTTLES DRAWN AEROBIC AND ANAEROBIC Blood Culture adequate volume   Culture   Final    NO GROWTH 2 DAYS Performed at [facility], [address] [place], [place] [address]    Report Status PENDING  Incomplete  Culture, blood (Routine x 2)     Status: None (Preliminary result)   Collection Time: 09/12/20  3:21 PM   Specimen: BLOOD  Result Value Ref Range Status   Specimen Description BLOOD LEFT ANTECUBITAL  Final   Special Requests   Final    BOTTLES DRAWN AEROBIC  AND ANAEROBIC Blood Culture adequate volume   Culture   Final    NO GROWTH 2 DAYS Performed at [facility], [address] [place], [place] [address]    Report Status PENDING  Incomplete  Resp Panel by RT-PCR (Flu A&B, Covid) Nasopharyngeal Swab     Status: None   Collection Time: 09/12/20  3:21 PM   Specimen: Nasopharyngeal Swab; Nasopharyngeal(NP) swabs in vial transport medium  Result Value Ref Range Status   SARS Coronavirus 2 by RT PCR NEGATIVE NEGATIVE Final    Comment: (NOTE) SARS-CoV-2 target nucleic acids  are NOT DETECTED.  The SARS-CoV-2 RNA is generally detectable in upper respiratory specimens during the acute phase of infection. The lowest concentration of SARS-CoV-2 viral copies this assay can detect is 138 copies/mL. A negative result does not preclude SARS-Cov-2 infection and should not be used as the sole basis for treatment or other patient management decisions. A negative result may occur with  improper specimen collection/handling, submission of specimen other than nasopharyngeal swab, presence of viral mutation(s) within the areas targeted by this assay, and inadequate number of viral copies(<138 copies/mL). A negative result must be combined with clinical observations, patient history, and epidemiological information. The expected result is Negative.  Fact Sheet for Patients:  EntrepreneurPulse.com.au  Fact Sheet for Healthcare Providers:  IncredibleEmployment.be  This test is no t yet approved or cleared by the [place] FDA and  has been authorized for detection and/or diagnosis of SARS-CoV-2 by FDA under an Emergency Use Authorization (EUA). This EUA will remain  in effect (meaning this test can be used) for the duration of the COVID-19 declaration under Section 564(b)(1) of the Act, 21 U.S.C.section 360bbb-3(b)(1), unless the authorization is terminated  or revoked sooner.       Influenza A by PCR NEGATIVE NEGATIVE Final   Influenza B by PCR NEGATIVE NEGATIVE Final    Comment: (NOTE) The Xpert Xpress SARS-CoV-2/FLU/RSV plus assay is intended as an aid in the diagnosis of influenza from Nasopharyngeal swab specimens and should not be used as a sole basis for treatment. Nasal washings and aspirates are unacceptable for Xpert Xpress SARS-CoV-2/FLU/RSV testing.  Fact Sheet for Patients: EntrepreneurPulse.com.au  Fact Sheet for Healthcare Providers: IncredibleEmployment.be  This test is  not yet approved or cleared by the [place] FDA and has been authorized for detection and/or diagnosis of SARS-CoV-2 by FDA under an Emergency Use Authorization (EUA). This EUA will remain in effect (meaning this test can be used) for the duration of the COVID-19 declaration under Section 564(b)(1) of the Act, 21 U.S.C. section 360bbb-3(b)(1), unless the authorization is terminated or revoked.  Performed at [facility], [address] [place], [place] [address]   Gastrointestinal Panel by PCR , Stool     Status: None   Collection Time: 09/12/20  3:21 PM   Specimen: STOOL  Result Value Ref Range Status   Campylobacter species NOT DETECTED NOT DETECTED Final   Plesimonas shigelloides NOT DETECTED NOT DETECTED Final   Salmonella species NOT DETECTED NOT DETECTED Final   Yersinia enterocolitica NOT DETECTED NOT DETECTED Final   Vibrio species NOT DETECTED NOT DETECTED Final   Vibrio cholerae NOT DETECTED NOT DETECTED Final   Enteroaggregative E coli (EAEC) NOT DETECTED NOT DETECTED Final   Enteropathogenic E coli (EPEC) NOT DETECTED NOT DETECTED Final   Enterotoxigenic E coli (ETEC) NOT DETECTED NOT DETECTED Final   Shiga like toxin producing E coli (STEC) NOT DETECTED NOT DETECTED Final   Shigella/Enteroinvasive  E coli (EIEC) NOT DETECTED NOT DETECTED Final   Cryptosporidium NOT DETECTED NOT DETECTED Final   Cyclospora cayetanensis NOT DETECTED NOT DETECTED Final   Entamoeba histolytica NOT DETECTED NOT DETECTED Final   Giardia lamblia NOT DETECTED NOT DETECTED Final   Adenovirus F40/41 NOT DETECTED NOT DETECTED Final   Astrovirus NOT DETECTED NOT DETECTED Final   Norovirus GI/GII NOT DETECTED NOT DETECTED Final   Rotavirus A NOT DETECTED NOT DETECTED Final   Sapovirus (I, II, IV, and V) NOT DETECTED NOT DETECTED Final    Comment: Performed at [facility], [address] [place], [place] [address]  C Difficile Quick Screen w PCR reflex     Status:  Abnormal   Collection Time: 09/12/20  3:21 PM   Specimen: STOOL  Result Value Ref Range Status   C Diff antigen POSITIVE (A) NEGATIVE Final   C Diff toxin POSITIVE (A) NEGATIVE Final   C Diff interpretation Toxin producing C. difficile detected.  Final    Comment: CRITICAL RESULT CALLED TO, READ BACK BY AND VERIFIED WITH: RAQUEL DAVID @2057  ON 09/12/20 SKL Performed at [facility], [address] [place], [place] [address]     Procedures and diagnostic studies:  US Venous Img Lower Bilateral (DVT)  Result Date: 09/13/2020 CLINICAL DATA:  87 year old male with bilateral lower extremity leg swelling, pulmonary embolism. EXAM: BILATERAL LOWER EXTREMITY VENOUS DOPPLER ULTRASOUND TECHNIQUE: Gray-scale sonography with graded compression, as well as color Doppler and duplex ultrasound were performed to evaluate the lower extremity deep venous systems from the level of the common femoral vein and including the common femoral, femoral, profunda femoral, popliteal and calf veins including the posterior tibial, peroneal and gastrocnemius veins when visible. The superficial great saphenous vein was also interrogated. Spectral Doppler was utilized to evaluate flow at rest and with distal augmentation maneuvers in the common femoral, femoral and popliteal veins. COMPARISON:  None. FINDINGS: RIGHT LOWER EXTREMITY Common Femoral Vein: No evidence of thrombus. Normal compressibility, respiratory phasicity and response to augmentation. Saphenofemoral Junction: No evidence of thrombus. Normal compressibility and flow on color Doppler imaging. Profunda Femoral Vein: No evidence of thrombus. Normal compressibility and flow on color Doppler imaging. Femoral Vein: No evidence of thrombus. Normal compressibility, respiratory phasicity and response to augmentation. Popliteal Vein: No evidence of thrombus. Normal compressibility, respiratory phasicity and response to augmentation. Calf Veins: The peroneal vein is  not well visualized. No evidence of thrombus. Normal compressibility and flow on color Doppler imaging. Other Findings:  None. LEFT LOWER EXTREMITY Common Femoral Vein: No evidence of thrombus. Normal compressibility, respiratory phasicity and response to augmentation. Saphenofemoral Junction: No evidence of thrombus. Normal compressibility and flow on color Doppler imaging. Profunda Femoral Vein: No evidence of thrombus. Normal compressibility and flow on color Doppler imaging. Femoral Vein: No evidence of thrombus. Normal compressibility, respiratory phasicity and response to augmentation. Popliteal Vein: No evidence of thrombus. Normal compressibility, respiratory phasicity and response to augmentation. Calf Veins: The peroneal vein is not well visualized. No evidence of thrombus. Normal compressibility and flow on color Doppler imaging. Other Findings:  None. IMPRESSION: No evidence of bilateral lower extremity deep vein thrombosis. Ruthann Cancer, MD [facility] Radiology Electronically Signed   By: Ruthann Cancer MD   On: 09/13/2020 11:17               LOS: 3 days   Maddix Kliewer  [facility]   Pager on [email or web address]. If 7PM-7AM, please contact night-coverage at [email or web address]  09/15/2020, 10:53 AM

## 2020-09-16 DIAGNOSIS — Z515 Encounter for palliative care: Secondary | ICD-10-CM

## 2020-09-16 DIAGNOSIS — N179 Acute kidney failure, unspecified: Secondary | ICD-10-CM | POA: Diagnosis not present

## 2020-09-16 DIAGNOSIS — E43 Unspecified severe protein-calorie malnutrition: Secondary | ICD-10-CM | POA: Diagnosis not present

## 2020-09-16 DIAGNOSIS — A419 Sepsis, unspecified organism: Secondary | ICD-10-CM | POA: Diagnosis not present

## 2020-09-16 DIAGNOSIS — K922 Gastrointestinal hemorrhage, unspecified: Secondary | ICD-10-CM | POA: Diagnosis not present

## 2020-09-16 DIAGNOSIS — Z7189 Other specified counseling: Secondary | ICD-10-CM

## 2020-09-16 DIAGNOSIS — A0471 Enterocolitis due to Clostridium difficile, recurrent: Secondary | ICD-10-CM | POA: Diagnosis not present

## 2020-09-16 LAB — CBC WITH DIFFERENTIAL/PLATELET
Abs Immature Granulocytes: 2.97 10*3/uL — ABNORMAL HIGH (ref 0.00–0.07)
Basophils Absolute: 0.2 10*3/uL — ABNORMAL HIGH (ref 0.0–0.1)
Basophils Relative: 1 %
Eosinophils Absolute: 0 10*3/uL (ref 0.0–0.5)
Eosinophils Relative: 0 %
HCT: 33.2 % — ABNORMAL LOW (ref 39.0–52.0)
Hemoglobin: 10.7 g/dL — ABNORMAL LOW (ref 13.0–17.0)
Immature Granulocytes: 14 %
Lymphocytes Relative: 34 %
Lymphs Abs: 7.1 10*3/uL — ABNORMAL HIGH (ref 0.7–4.0)
MCH: 28.7 pg (ref 26.0–34.0)
MCHC: 32.2 g/dL (ref 30.0–36.0)
MCV: 89 fL (ref 80.0–100.0)
Monocytes Absolute: 4.7 10*3/uL — ABNORMAL HIGH (ref 0.1–1.0)
Monocytes Relative: 22 %
Neutro Abs: 6.1 10*3/uL (ref 1.7–7.7)
Neutrophils Relative %: 29 %
Platelets: 132 10*3/uL — ABNORMAL LOW (ref 150–400)
RBC: 3.73 MIL/uL — ABNORMAL LOW (ref 4.22–5.81)
RDW: 18.5 % — ABNORMAL HIGH (ref 11.5–15.5)
Smear Review: NORMAL
WBC: 21 10*3/uL — ABNORMAL HIGH (ref 4.0–10.5)
nRBC: 0.3 % — ABNORMAL HIGH (ref 0.0–0.2)

## 2020-09-16 LAB — BASIC METABOLIC PANEL
Anion gap: 6 (ref 5–15)
BUN: 21 mg/dL (ref 8–23)
CO2: 19 mmol/L — ABNORMAL LOW (ref 22–32)
Calcium: 7.5 mg/dL — ABNORMAL LOW (ref 8.9–10.3)
Chloride: 104 mmol/L (ref 98–111)
Creatinine, Ser: 0.94 mg/dL (ref 0.61–1.24)
GFR, Estimated: >60 mL/min (ref >60)
Glucose, Bld: 94 mg/dL (ref 70–99)
Potassium: 4.6 mmol/L (ref 3.5–5.1)
Sodium: 129 mmol/L — ABNORMAL LOW (ref 135–145)

## 2020-09-16 LAB — MAGNESIUM: Magnesium: 2 mg/dL (ref 1.7–2.4)

## 2020-09-16 LAB — PHOSPHORUS: Phosphorus: 2.7 mg/dL (ref 2.5–4.6)

## 2020-09-16 MED ORDER — SODIUM CHLORIDE 0.9% FLUSH
10.0000 mL | Freq: Two times a day (BID) | INTRAVENOUS | Status: DC
  Administered 2020-09-17 – 2020-09-23 (×5): 10 mL

## 2020-09-16 MED ORDER — SODIUM CHLORIDE 0.9% FLUSH: 10.0000 mL | INTRAVENOUS | Status: DC | PRN

## 2020-09-16 NOTE — Progress Notes (Addendum)
Progress Note    Dustin Howell  VQM:[id number] DOB: [birth year]  DOA: 09/12/2020 PCP: [name], MD      Brief Narrative:    Medical records reviewed and are as summarized below:  Dustin Howell is a 87 y.o. male  with medical history significant for history of C. difficile colitis, CAD s/p DES, lymphoproliferative disorder, recent lower GI bleed, recent C. difficile infection, hearing impairment, recent discharge from the hospital on 07/26/2020 after hospitalization for right hip fracture s/p IM nailing on 07/21/2020.  He had recently seen Dr. [name] on 09/10/2020 for recurrent diarrhea and he was restarted on oral vancomycin.  He presented to the hospital with frequent diarrhea.  He was admitted to the hospital for severe sepsis secondary to recurrent C. difficile colitis.  He was treated with fidaxomicin, IV Flagyl and IV fluids. He was also found to have left-sided pulmonary embolism, hyponatremia and AKI complicated by hyperkalemia.  After thorough discussion with patient and family regarding management of pulmonary embolism, they decided to proceed with treatment with Eliquis.  However, patient developed acute GI bleeding after 2 doses of Eliquis.  Aspirin and Eliquis were discontinued.  Patient and her daughter declined IVC filter.    Assessment/Plan:   Principal Problem:   Severe sepsis (HCC) Active Problems:   Acute lower GI bleeding   CAD (coronary artery disease)   Protein-calorie malnutrition, severe   Recurrent colitis due to Clostridium difficile   AKI (acute kidney injury) ([place])   Hyponatremia   Hyperkalemia   Body mass index is 19.11 kg/m.    Severe sepsis due to recurrent C. difficile colitis, leukocytosis: Continue with fidaxomicin, Flagyl and IV fluids.  Acute pulmonary embolism: Venous duplex of bilateral lower extremities did not show any evidence of DVT. Eliquis discontinued because of GI bleeding  Acute GI bleeding/rectal bleeding, history of GI  bleed: H&H stable thus far. Patient and daughter declined any endoscopic work-up. Continue to monitor. Flex sig showed AVM and diverticula in December 2021.  According to his daughter, he almost died from rectal bleeding about 5 years ago.  Mild thrombocytopenia: Monitor platelet count  Hyponatremia: Monitor BMP.  CAD s/p DES: Aspirin discontinued  because of GI bleeding  Severe protein calorie malnutrition, peripheral edema, scrotal edema, hypoalbuminemia: Encourage adequate oral intake.  Continue feeding supplements  AKI, hyperkalemia and acute hypoxic respiratory failure: Resolved  S/p right hip IM nailing on 07/21/2020.       Diet Order            DIET - DYS 1 Room service appropriate? Yes; Fluid consistency: Thin  Diet effective now                    Consultants:  None  Procedures:  None    Medications:   . atorvastatin  10 mg Oral Daily  . feeding supplement  237 mL Oral BID BM  . fidaxomicin  200 mg Oral BID   Continuous Infusions: . lactated ringers 50 mL/hr at 09/14/20 1152  . metronidazole 500 mg (09/15/20 2343)     Anti-infectives (From admission, onward)   Start     Dose/Rate Route Frequency Ordered Stop   11/08/20 1000  vancomycin (VANCOCIN) 50 mg/mL oral solution 125 mg  Status:  Discontinued       "Followed by" Linked Group Details   125 mg Oral Every 3 DAYS 09/12/20 2017 09/13/20 0955   10/11/20 1000  vancomycin (VANCOCIN) 50 mg/mL oral solution 125 mg  Status:  Discontinued       "Followed by" Linked Group Details   125 mg Oral Every other day 09/12/20 2017 09/13/20 0955   10/04/20 1000  vancomycin (VANCOCIN) 50 mg/mL oral solution 125 mg  Status:  Discontinued       "Followed by" Linked Group Details   125 mg Oral Daily 09/12/20 2017 09/13/20 0955   09/27/20 1000  vancomycin (VANCOCIN) 50 mg/mL oral solution 125 mg  Status:  Discontinued       "Followed by" Linked Group Details   125 mg Oral 2 times daily 09/12/20 2017 09/13/20 0955    09/13/20 1200  fidaxomicin (DIFICID) tablet 200 mg        200 mg Oral 2 times daily 09/13/20 0955 09/23/20 0959   09/13/20 0000  metroNIDAZOLE (FLAGYL) IVPB 500 mg        500 mg 100 mL/hr over 60 Minutes Intravenous Every 8 hours 09/12/20 2017     09/12/20 2200  vancomycin (VANCOCIN) 50 mg/mL oral solution 125 mg  Status:  Discontinued       "Followed by" Linked Group Details   125 mg Oral 4 times daily 09/12/20 2017 09/13/20 0955   09/12/20 1545  ceFEPIme (MAXIPIME) 2 g in sodium chloride 0.9 % 100 mL IVPB        2 g 200 mL/hr over 30 Minutes Intravenous  Once 09/12/20 1537 09/12/20 1558   09/12/20 1545  metroNIDAZOLE (FLAGYL) IVPB 500 mg        500 mg 100 mL/hr over 60 Minutes Intravenous  Once 09/12/20 1537 09/12/20 1639             Family Communication/Anticipated D/C date and plan/Code Status   DVT prophylaxis:      Code Status: DNR  Family Communication: Discussed with his daughter, Dustin Howell Disposition Plan:    Status is: Inpatient  Remains inpatient appropriate because:IV treatments appropriate due to intensity of illness or inability to take PO   Dispo: The patient is from: Home              Anticipated d/c is to: Home              Patient currently is not medically stable to d/c.   Difficult to place patient No           Subjective:   Interval events noted. He had a bowel fall watery, bloody stools overnight. His daughter was at the bedside.  Objective:    Vitals:   09/15/20 1947 09/15/20 2356 09/16/20 0523 09/16/20 0754  BP: 122/69 132/73 118/63 115/68  Pulse: 98 98 (!) 101 97  Resp: 16 15 16 16   Temp: 98.3 F (36.8 C) (!) 97.5 F (36.4 C) 98.1 F (36.7 C) 97.9 F (36.6 C)  TempSrc: Oral Oral    SpO2: 98% 97% 97% 97%  Weight:      Height:       No data found.   Intake/Output Summary (Last 24 hours) at 09/16/2020 0939 Last data filed at 09/15/2020 2000 Gross per 24 hour  Intake --  Output 225 ml  Net -225 ml   Filed  Weights   09/12/20 1454 09/12/20 2110 09/13/20 0500  Weight: 45.4 kg 52.6 kg 52.1 kg    Exam:   GEN: NAD SKIN: No rash EYES: EOMI ENT: MMM CV: RRR PULM: CTA B ABD: soft, ND, NT, +BS CNS: AAO x 3, non focal EXT: B/l lower extremity edema from thighs to feet. No erythema  or tenderness GU: Scrotal edema without erythema or tenderness       Data Reviewed:   I have personally reviewed following labs and imaging studies:  Labs: Labs show the following:   Basic Metabolic Panel: Recent Labs  Lab 09/12/20 1521 09/13/20 0525 09/14/20 0535 09/15/20 0525 09/16/20 0905  NA 126* 132* 129* 129* 129*  K 5.6* 5.0 4.5 4.4 4.6  CL 94* 104 102 103 104  CO2 20* 22 20* 22 19*  GLUCOSE 198* 70 75 84 94  BUN 35* 27* 21 24* 21  CREATININE 1.48* 1.05 1.05 0.87 0.94  CALCIUM 7.6* 7.0* 7.1* 7.1* 7.5*  MG  --  2.3 2.1  --  2.0  PHOS  --   --  3.3  --  2.7   GFR Estimated Creatinine Clearance: 35.4 mL/min (by C-G formula based on SCr of 0.94 mg/dL). Liver Function Tests: Recent Labs  Lab 09/12/20 1521 09/13/20 0525  AST 37 26  ALT 17 12  ALKPHOS 223* 170*  BILITOT 1.1 1.0  PROT 5.5* 4.2*  ALBUMIN 2.0* 1.6*   No results for input(s): LIPASE, AMYLASE in the last 168 hours. No results for input(s): AMMONIA in the last 168 hours. Coagulation profile Recent Labs  Lab 09/12/20 1521  INR 1.0    CBC: Recent Labs  Lab 09/12/20 1521 09/13/20 0525 09/14/20 0535 09/15/20 0525 09/15/20 1240 09/16/20 0905  WBC 36.0* 22.6* 18.0* 15.3*  --  21.0*  NEUTROABS 33.8*  --  9.1* 5.6  --  PENDING  HGB 12.3* 11.0* 10.1* 10.1* 10.8* 10.7*  HCT 38.8* 33.6* 31.2* 29.7* 32.5* 33.2*  MCV 88.6 87.7 87.9 86.1  --  89.0  PLT 185 139* 134* 136*  --  132*   Cardiac Enzymes: No results for input(s): CKTOTAL, CKMB, CKMBINDEX, TROPONINI in the last 168 hours. BNP (last 3 results) No results for input(s): PROBNP in the last 8760 hours. CBG: Recent Labs  Lab 09/13/20 0647 09/13/20 0658  09/13/20 0700 09/13/20 0705 09/13/20 0728  GLUCAP 52* 55* 65* 70 74   D-Dimer: No results for input(s): DDIMER in the last 72 hours. Hgb A1c: No results for input(s): HGBA1C in the last 72 hours. Lipid Profile: No results for input(s): CHOL, HDL, LDLCALC, TRIG, CHOLHDL, LDLDIRECT in the last 72 hours. Thyroid function studies: No results for input(s): TSH, T4TOTAL, T3FREE, THYROIDAB in the last 72 hours.  Invalid input(s): FREET3 Anemia work up: No results for input(s): VITAMINB12, FOLATE, FERRITIN, TIBC, IRON, RETICCTPCT in the last 72 hours. Sepsis Labs: Recent Labs  Lab 09/12/20 1521 09/13/20 0525 09/14/20 0535 09/15/20 0525 09/16/20 0905  PROCALCITON  --  1.94  --   --   --   WBC 36.0* 22.6* 18.0* 15.3* 21.0*  LATICACIDVEN 2.8*  4.1*  --  1.2  --   --     Microbiology Recent Results (from the past 240 hour(s))  Culture, blood (Routine x 2)     Status: None (Preliminary result)   Collection Time: 09/12/20  3:21 PM   Specimen: BLOOD  Result Value Ref Range Status   Specimen Description BLOOD RIGHT ANTECUBITAL  Final   Special Requests   Final    BOTTLES DRAWN AEROBIC AND ANAEROBIC Blood Culture adequate volume   Culture   Final    NO GROWTH 4 DAYS Performed at [facility], [address] [place], [place] [address]    Report Status PENDING  Incomplete  Culture, blood (Routine x 2)     Status: None (  Preliminary result)   Collection Time: 09/12/20  3:21 PM   Specimen: BLOOD  Result Value Ref Range Status   Specimen Description BLOOD LEFT ANTECUBITAL  Final   Special Requests   Final    BOTTLES DRAWN AEROBIC AND ANAEROBIC Blood Culture adequate volume   Culture   Final    NO GROWTH 4 DAYS Performed at [facility], [address] [place], [place] [address]    Report Status PENDING  Incomplete  Resp Panel by RT-PCR (Flu A&B, Covid) Nasopharyngeal Swab     Status: None   Collection Time: 09/12/20  3:21 PM   Specimen: Nasopharyngeal Swab;  Nasopharyngeal(NP) swabs in vial transport medium  Result Value Ref Range Status   SARS Coronavirus 2 by RT PCR NEGATIVE NEGATIVE Final    Comment: (NOTE) SARS-CoV-2 target nucleic acids are NOT DETECTED.  The SARS-CoV-2 RNA is generally detectable in upper respiratory specimens during the acute phase of infection. The lowest concentration of SARS-CoV-2 viral copies this assay can detect is 138 copies/mL. A negative result does not preclude SARS-Cov-2 infection and should not be used as the sole basis for treatment or other patient management decisions. A negative result may occur with  improper specimen collection/handling, submission of specimen other than nasopharyngeal swab, presence of viral mutation(s) within the areas targeted by this assay, and inadequate number of viral copies(<138 copies/mL). A negative result must be combined with clinical observations, patient history, and epidemiological information. The expected result is Negative.  Fact Sheet for Patients:  EntrepreneurPulse.com.au  Fact Sheet for Healthcare Providers:  IncredibleEmployment.be  This test is no t yet approved or cleared by the [place] FDA and  has been authorized for detection and/or diagnosis of SARS-CoV-2 by FDA under an Emergency Use Authorization (EUA). This EUA will remain  in effect (meaning this test can be used) for the duration of the COVID-19 declaration under Section 564(b)(1) of the Act, 21 U.S.C.section 360bbb-3(b)(1), unless the authorization is terminated  or revoked sooner.       Influenza A by PCR NEGATIVE NEGATIVE Final   Influenza B by PCR NEGATIVE NEGATIVE Final    Comment: (NOTE) The Xpert Xpress SARS-CoV-2/FLU/RSV plus assay is intended as an aid in the diagnosis of influenza from Nasopharyngeal swab specimens and should not be used as a sole basis for treatment. Nasal washings and aspirates are unacceptable for Xpert Xpress  SARS-CoV-2/FLU/RSV testing.  Fact Sheet for Patients: EntrepreneurPulse.com.au  Fact Sheet for Healthcare Providers: IncredibleEmployment.be  This test is not yet approved or cleared by the [place] FDA and has been authorized for detection and/or diagnosis of SARS-CoV-2 by FDA under an Emergency Use Authorization (EUA). This EUA will remain in effect (meaning this test can be used) for the duration of the COVID-19 declaration under Section 564(b)(1) of the Act, 21 U.S.C. section 360bbb-3(b)(1), unless the authorization is terminated or revoked.  Performed at [facility], [address] [place], [place] [address]   Gastrointestinal Panel by PCR , Stool     Status: None   Collection Time: 09/12/20  3:21 PM   Specimen: STOOL  Result Value Ref Range Status   Campylobacter species NOT DETECTED NOT DETECTED Final   Plesimonas shigelloides NOT DETECTED NOT DETECTED Final   Salmonella species NOT DETECTED NOT DETECTED Final   Yersinia enterocolitica NOT DETECTED NOT DETECTED Final   Vibrio species NOT DETECTED NOT DETECTED Final   Vibrio cholerae NOT DETECTED NOT DETECTED Final   Enteroaggregative E coli (EAEC) NOT DETECTED  NOT DETECTED Final   Enteropathogenic E coli (EPEC) NOT DETECTED NOT DETECTED Final   Enterotoxigenic E coli (ETEC) NOT DETECTED NOT DETECTED Final   Shiga like toxin producing E coli (STEC) NOT DETECTED NOT DETECTED Final   Shigella/Enteroinvasive E coli (EIEC) NOT DETECTED NOT DETECTED Final   Cryptosporidium NOT DETECTED NOT DETECTED Final   Cyclospora cayetanensis NOT DETECTED NOT DETECTED Final   Entamoeba histolytica NOT DETECTED NOT DETECTED Final   Giardia lamblia NOT DETECTED NOT DETECTED Final   Adenovirus F40/41 NOT DETECTED NOT DETECTED Final   Astrovirus NOT DETECTED NOT DETECTED Final   Norovirus GI/GII NOT DETECTED NOT DETECTED Final   Rotavirus A NOT DETECTED NOT DETECTED Final   Sapovirus (I,  II, IV, and V) NOT DETECTED NOT DETECTED Final    Comment: Performed at [facility], [address] [place], [place] [address]  C Difficile Quick Screen w PCR reflex     Status: Abnormal   Collection Time: 09/12/20  3:21 PM   Specimen: STOOL  Result Value Ref Range Status   C Diff antigen POSITIVE (A) NEGATIVE Final   C Diff toxin POSITIVE (A) NEGATIVE Final   C Diff interpretation Toxin producing C. difficile detected.  Final    Comment: CRITICAL RESULT CALLED TO, READ BACK BY AND VERIFIED WITH: RAQUEL DAVID @2057  ON 09/12/20 SKL Performed at [facility], [address] [place], [place] [address]     Procedures and diagnostic studies:  No results found.             LOS: 4 days   Joquan Lotz  [facility]   Pager on [email or web address]. If 7PM-7AM, please contact night-coverage at [email or web address]     09/16/2020, 9:39 AM

## 2020-09-16 NOTE — Evaluation (Signed)
Physical Therapy Evaluation Patient Details Name: Dustin Howell MRN: [id number] DOB: May 23, 1925 Today's Date: 09/16/2020   History of Present Illness  Pt is a 87 y.o. male presenting to ED d/t abnormal labs, diarrhea for about 1 month (recent C-diff infection), persistent generalized weakness, and SOB. S/p R IMN intertrochantric 07/21/20.  Pt admitted with severe sepsis d/t recurrent C. difficile colitis, acute respiratory failure with hypoxia, AKI, hyponatremia, hyperkalemia. Also diagnosed with acute PE but after 2 doses of Eliquis pt with acute GI bleed so anti-coagulation stopped; declined IVC filter.  PMH includes h/o GI bleed (Dec 2021), HOH (R sided hearing aid), anemia, lymphoproliferative disorder, CKD, B rotator cuff issues.  Clinical Impression  Prior to hospital admission, pt reports being ambulatory shorter household distances with RW; lives with his daughter.  Pt sitting in recliner upon PT arrival and reporting already sitting up for 3 hours and was tired and wanted to get back to bed.  Upon pt standing up to RW (mod assist), blood and clots noted on linen on chair and then blood started dripping on floor (rectal bleeding).  Therapist assisted pt back to sitting on clean pad; nurse notified immediately; and therapist cleaned up floor.  Disposable chuck pad utilized to "catch" any dripping blood and pt able to walk a few feet recliner to bed with RW (CGA to min assist).  Min assist for LE's sit to semi-supine in bed.  Clean sheets placed under pt and nurse present towards end of session.  Pt would benefit from skilled PT to address noted impairments and functional limitations (see below for any additional details).  Upon hospital discharge, pt would benefit from STR although pt would like to discharge home instead.    Follow Up Recommendations SNF    Equipment Recommendations  Rolling walker with 5" wheels;3in1 (PT);Wheelchair (measurements PT);Wheelchair cushion (measurements PT)     Recommendations for Other Services       Precautions / Restrictions Precautions Precautions: Fall Restrictions Weight Bearing Restrictions: No Other Position/Activity Restrictions: Per [facility] note 1/24: WBAT with a walker (s/p R IMN intertrochantric 07/21/20)      Mobility  Bed Mobility Overal bed mobility: Needs Assistance Bed Mobility: Sit to Supine       Sit to supine: Min assist;HOB elevated   General bed mobility comments: assist for B LE's    Transfers Overall transfer level: Needs assistance Equipment used: Rolling walker (2 wheeled) Transfers: Sit to/from Stand Sit to Stand: Mod assist         General transfer comment: x2 trials from recliner; vc's for UE/LE placement; assist to initiate and come to full stand and control descent sitting  Ambulation/Gait Ambulation/Gait assistance: Min guard;Min assist Gait Distance (Feet): 3 Feet (recliner to bed) Assistive device: Rolling walker (2 wheeled)   Gait velocity: decreased   General Gait Details: decreased B LE step length/foot clearance; assist to steady  Stairs            Wheelchair Mobility    Modified Rankin (Stroke Patients Only)       Balance Overall balance assessment: Needs assistance Sitting-balance support: No upper extremity supported;Feet supported Sitting balance-Leahy Scale: Good Sitting balance - Comments: steady sitting reaching within BOS   Standing balance support: Single extremity supported Standing balance-Leahy Scale: Poor Standing balance comment: pt requiring at least single UE support for static standing balance  Pertinent Vitals/Pain Pain Assessment: No/denies pain  Vitals (HR and O2 on room air) stable and WFL throughout treatment session.    Home Living Family/patient expects to be discharged to:: Private residence Living Arrangements: Children (pt's daughter) Available Help at Discharge: Family;Available  PRN/intermittently Type of Home: House Home Access: Stairs to enter;Ramped entrance   Entrance Stairs-Number of Steps: 2-3 Home Layout: Multi-level;Able to live on main level with bedroom/bathroom Home Equipment: Bedside commode;Shower seat;Walker - 2 wheels      Prior Function Level of Independence: Needs assistance   Gait / Transfers Assistance Needed: Ambulatory shorter household distances with RW since discharge home from SNF.  ADL's / Homemaking Assistance Needed: Pt takes bird baths at baseline.        Hand Dominance   Dominant Hand: Right    Extremity/Trunk Assessment   Upper Extremity Assessment Upper Extremity Assessment: Generalized weakness    Lower Extremity Assessment Lower Extremity Assessment: Generalized weakness       Communication   Communication: HOH  Cognition Arousal/Alertness: Awake/alert Behavior During Therapy: WFL for tasks assessed/performed Overall Cognitive Status: Within Functional Limits for tasks assessed                                        General Comments   Nursing cleared pt for participation in physical therapy.  Pt agreeable to PT session.    Exercises     Assessment/Plan    PT Assessment Patient needs continued PT services  PT Problem List Decreased strength;Decreased activity tolerance;Decreased balance;Decreased mobility;Decreased knowledge of use of DME;Decreased knowledge of precautions;Cardiopulmonary status limiting activity       PT Treatment Interventions DME instruction;Gait training;Functional mobility training;Therapeutic activities;Therapeutic exercise;Balance training;Patient/family education    PT Goals (Current goals can be found in the Care Plan section)  Acute Rehab PT Goals Patient Stated Goal: to go home PT Goal Formulation: With patient Time For Goal Achievement: 09/30/20 Potential to Achieve Goals: Fair    Frequency Min 2X/week   Barriers to discharge Decreased caregiver  support      Co-evaluation               AM-PAC PT "6 Clicks" Mobility  Outcome Measure Help needed turning from your back to your side while in a flat bed without using bedrails?: None Help needed moving from lying on your back to sitting on the side of a flat bed without using bedrails?: A Little Help needed moving to and from a bed to a chair (including a wheelchair)?: A Little Help needed standing up from a chair using your arms (e.g., wheelchair or bedside chair)?: A Lot Help needed to walk in hospital room?: A Lot Help needed climbing 3-5 steps with a railing? : Total 6 Click Score: 15    End of Session Equipment Utilized During Treatment: Gait belt Activity Tolerance: Patient limited by fatigue Patient left: in bed;with call bell/phone within reach;with bed alarm set;Other (comment) (B heels floating via pillow support) Nurse Communication: Mobility status;Precautions;Other (comment) (pt's L UE IV leaking; blood and clots noted) PT Visit Diagnosis: Other abnormalities of gait and mobility (R26.89);Muscle weakness (generalized) (M62.81);Difficulty in walking, not elsewhere classified (R26.2);History of falling (Z91.81)    Time: 1025-1110 PT Time Calculation (min) (ACUTE ONLY): 45 min   Charges:   PT Evaluation $PT Eval Low Complexity: 1 Low PT Treatments $Therapeutic Activity: 8-22 mins       Leitha Bleak,  PT 09/16/20, 11:52 AM

## 2020-09-16 NOTE — Consult Note (Signed)
Consultation Note Date: 09/16/2020   Patient Name: Dustin Howell  DOB: 12/14/1924  MRN: [id number]  Age / Sex: 87 y.o., male  PCP: [name], MD Referring Physician: Jennye Boroughs, MD  Reason for Consultation: Establishing goals of care  HPI/Patient Profile: 87 y.o. male  with past medical history of CAD s/p DES, lymphoproliferative disorder, hearing impairment, recent history of cdiff infection, recent lower GI bleeding. Patient was hospitalized 07/26/20 for right hip fracture s/p IM nailing where he was discharged to rehab. Recurrent admission for severe sepsis secondary to recurrent c. Difficile colitis and found to have left-sided PE, hyponatremia, AKI. Received 2 doses of Eliquis and patient unfortunately developed GI bleeding. Aspirin and Eliquis discontinued. Patient/daughter decline IVC filter. Receiving antibiotics for cdiff colitis. Palliative medicine consultation for goals of care.   Clinical Assessment and Goals of Care:  I have reviewed medical records, discussed with care team, and visited patient. He is resting. He sat in the chair for multiple hours this afternoon. No family at bedside.  Spoke with daughter, Dustin Howell via telephone.   I introduced Palliative Medicine as specialized medical care for people living with serious illness. It focuses on providing relief from the symptoms and stress of a serious illness. The goal is to improve quality of life for both the patient and the family.  We discussed a brief life review of the patient. Widowed. Wife passed ~9 years ago. Two children, one daughter Dustin Howell) and son Dustin Howell). Prior to admission for fall, patient living home and independent. He was still driving and paying his bills.  Dustin Howell shares his health decline in the last few months with recurrent hospitalization, complications, and weakness requiring rehab stay. Reviewed course of  hospitalization including diagnoses, interventions, plan of care.   I attempted to elicit values and goals of care important to the patient and daughter. Dustin Howell shares that her father often states he is "ready to die" especially with declining quality of life in the last few months. She shares he is "not the type to sit around to do nothing" and is struggling with declining health. Dustin Howell confirms his decision for DNR/DNI code status and against IVC filter placement. She shares that Mr. Dolce has done everything he wished to in life and is ready when the time comes.   Mr. Fussell wishes to return home on discharge. Dustin Howell is considering bringing him home but can only do so if he is able to stand/pivot and walk short distances. She would prefer to avoid rehab because of confusion and hallucinations that her father experiences outside of his home environment. Dustin Howell is interested in home health physical therapy if he is able to ambulate short distances and return home.   Reviewed PT recommendations for today. Dustin Howell is hopeful that tomorrow he could ambulate with physical therapy. Reassured Renee that I will f/u in AM and discuss with therapy.    Questions and concerns were addressed.   SUMMARY OF RECOMMENDATIONS    Continue current plan of care and medical management.  Continue PT/OT  efforts. Patient prefers to return home on discharge. Daughter would like to bring him home (and avoid rehab) but he needs to be able to ambulate short distances in order for her to care for him. Daughter hopeful he can ambulate with physical therapy tomorrow.   PMT provider will f/u 3/8.  May benefit from outpatient palliative referral.  Code Status/Advance Care Planning:  DNR: confirmed by daughter  Symptom Management:   Per attending   Palliative Prophylaxis:   Aspiration, Delirium Protocol, Frequent Pain Assessment, Oral Care and Turn Reposition  Psycho-social/Spiritual:   Desire for further Chaplaincy  support: yes  Additional Recommendations: Caregiving  Support/Resources, Compassionate Wean Education and Education on Hospice  Prognosis:   Unable to determine  Discharge Planning: To Be Determined      Primary Diagnoses: Present on Admission: . Severe sepsis ([place]) . Recurrent colitis due to Clostridium difficile . CAD (coronary artery disease) . AKI (acute kidney injury) ([place]) . Hyponatremia . Hyperkalemia . Protein-calorie malnutrition, severe   I have reviewed the medical record, interviewed the patient and family, and examined the patient. The following aspects are pertinent.  Past Medical History:  Diagnosis Date  . GERD (gastroesophageal reflux disease)   . GI bleed 04/2016   presumed diverticular source, treated with embolization by IR  . HOH (hard of hearing)    right sided hearing aid  . Iron deficiency anemia 03/2018  . Lymphoproliferative disorder ([place]) 03/2018   heme: Dr Janese Banks in [place].  low grade monocytosis, thrombocytopenia: likely low-grade lymphproliferative disorder.     Social History   Socioeconomic History  . Marital status: Widowed    Spouse name: Not on file  . Number of children: Not on file  . Years of education: Not on file  . Highest education level: Not on file  Occupational History  . Not on file  Tobacco Use  . Smoking status: Former Smoker    Packs/day: 0.50    Types: Cigarettes    Quit date: 1957    Years since quitting: 65.2  . Smokeless tobacco: Current User    Types: Snuff  Substance and Sexual Activity  . Alcohol use: Yes    Comment: every evening with supper  . Drug use: No  . Sexual activity: Never  Other Topics Concern  . Not on file  Social History Narrative  . Not on file   Social Determinants of Health   Financial Resource Strain: Not on file  Food Insecurity: Not on file  Transportation Needs: Not on file  Physical Activity: Not on file  Stress: Not on file  Social Connections: Not on file   Family  History  Problem Relation Age of Onset  . Diabetes Mother   . Stroke Father   . Ovarian cancer Sister    Scheduled Meds: . atorvastatin  10 mg Oral Daily  . feeding supplement  237 mL Oral BID BM  . fidaxomicin  200 mg Oral BID   Continuous Infusions: . lactated ringers 50 mL/hr at 09/14/20 1152  . metronidazole 500 mg (09/16/20 1019)   PRN Meds:.acetaminophen **OR** acetaminophen, ondansetron **OR** ondansetron (ZOFRAN) IV Medications Prior to Admission:  Prior to Admission medications   Medication Sig Start Date End Date Taking? Authorizing Provider  aspirin 81 MG chewable tablet Chew 1 tablet (81 mg total) by mouth daily. 07/05/20  Yes Marianna Payment, MD  atorvastatin (LIPITOR) 10 MG tablet Take 10 mg by mouth daily.   Yes [provider]  ferrous sulfate 325 (65 FE)  MG tablet Take 1 tablet (325 mg total) by mouth 2 (two) times daily with a meal. 07/26/20  Yes Sharen Hones, MD  vancomycin (VANCOCIN) 125 MG capsule Take 125 mg by mouth in the morning, at noon, in the evening, and at bedtime. 08/29/20  Yes [provider]  acetaminophen (TYLENOL) 325 MG tablet Take 1-2 tablets (325-650 mg total) by mouth every 6 (six) hours as needed for mild pain (pain score 1-3 or temp > 100.5). 07/26/20   Sharen Hones, MD  amLODipine (NORVASC) 5 MG tablet Take 1 tablet (5 mg total) by mouth daily. Patient not taking: No sig reported 05/20/16   Bonnielee Haff, MD  bisacodyl (DULCOLAX) 10 MG suppository Place 1 suppository (10 mg total) rectally daily as needed for moderate constipation. 07/26/20   Sharen Hones, MD  feeding supplement (ENSURE ENLIVE / ENSURE PLUS) LIQD Take 237 mLs by mouth 2 (two) times daily between meals. 07/26/20   Sharen Hones, MD  nitroGLYCERIN (NITROSTAT) 0.4 MG SL tablet Place 0.4 mg under the tongue every 5 (five) minutes as needed for chest pain.    [provider]   No Known Allergies Review of Systems  Constitutional: Positive for activity change.   Gastrointestinal: Positive for diarrhea.   Physical Exam Vitals and nursing note reviewed.  Constitutional:      General: He is awake.  Pulmonary:     Effort: No tachypnea, accessory muscle usage or respiratory distress.  Skin:    General: Skin is warm and dry.  Neurological:     Mental Status: He is alert and oriented to person, place, and time.    Vital Signs: BP 130/65 (BP Location: Left Arm)   Pulse 91   Temp (!) 97.4 F (36.3 C)   Resp 16   Ht 5\' 5"  (1.651 m)   Wt 52.1 kg   SpO2 98%   BMI 19.11 kg/m  Pain Scale: 0-10   Pain Score: 0-No pain   SpO2: SpO2: 98 % O2 Device:SpO2: 98 % O2 Flow Rate: .O2 Flow Rate (L/min): 2 L/min  IO: Intake/output summary:   Intake/Output Summary (Last 24 hours) at 09/16/2020 1438 Last data filed at 09/15/2020 2000 Gross per 24 hour  Intake -  Output 225 ml  Net -225 ml    LBM: Last BM Date: 09/15/20 Baseline Weight: Weight: 45.4 kg Most recent weight: Weight: 52.1 kg     Palliative Assessment/Data: PPS 40%    Time Total: 40 Greater than 50%  of this time was spent counseling and coordinating care related to the above assessment and plan.  Signed by:  Ihor Dow, DNP, FNP-C Palliative Medicine Team  Phone: [phone] Fax: [phone]   Please contact Palliative Medicine Team phone at [phone] for questions and concerns.  For individual provider: See Shea Evans

## 2020-09-17 DIAGNOSIS — A419 Sepsis, unspecified organism: Secondary | ICD-10-CM | POA: Diagnosis not present

## 2020-09-17 DIAGNOSIS — A0471 Enterocolitis due to Clostridium difficile, recurrent: Secondary | ICD-10-CM | POA: Diagnosis not present

## 2020-09-17 DIAGNOSIS — E43 Unspecified severe protein-calorie malnutrition: Secondary | ICD-10-CM | POA: Diagnosis not present

## 2020-09-17 DIAGNOSIS — K922 Gastrointestinal hemorrhage, unspecified: Secondary | ICD-10-CM | POA: Diagnosis not present

## 2020-09-17 LAB — CBC WITH DIFFERENTIAL/PLATELET
Abs Immature Granulocytes: 2.19 10*3/uL — ABNORMAL HIGH (ref 0.00–0.07)
Basophils Absolute: 0.1 10*3/uL (ref 0.0–0.1)
Basophils Relative: 1 %
Eosinophils Absolute: 0 10*3/uL (ref 0.0–0.5)
Eosinophils Relative: 0 %
HCT: 29.4 % — ABNORMAL LOW (ref 39.0–52.0)
Hemoglobin: 9.4 g/dL — ABNORMAL LOW (ref 13.0–17.0)
Immature Granulocytes: 16 %
Lymphocytes Relative: 24 %
Lymphs Abs: 3.2 10*3/uL (ref 0.7–4.0)
MCH: 28.7 pg (ref 26.0–34.0)
MCHC: 32 g/dL (ref 30.0–36.0)
MCV: 89.9 fL (ref 80.0–100.0)
Monocytes Absolute: 3.3 10*3/uL — ABNORMAL HIGH (ref 0.1–1.0)
Monocytes Relative: 24 %
Neutro Abs: 4.8 10*3/uL (ref 1.7–7.7)
Neutrophils Relative %: 35 %
Platelets: 112 10*3/uL — ABNORMAL LOW (ref 150–400)
RBC: 3.27 MIL/uL — ABNORMAL LOW (ref 4.22–5.81)
RDW: 18.3 % — ABNORMAL HIGH (ref 11.5–15.5)
Smear Review: NORMAL
WBC: 13.6 10*3/uL — ABNORMAL HIGH (ref 4.0–10.5)
nRBC: 0.4 % — ABNORMAL HIGH (ref 0.0–0.2)

## 2020-09-17 LAB — CULTURE, BLOOD (ROUTINE X 2)
Culture: NO GROWTH
Culture: NO GROWTH
Special Requests: ADEQUATE
Special Requests: ADEQUATE

## 2020-09-17 LAB — BASIC METABOLIC PANEL
Anion gap: 5 (ref 5–15)
BUN: 18 mg/dL (ref 8–23)
CO2: 22 mmol/L (ref 22–32)
Calcium: 7.2 mg/dL — ABNORMAL LOW (ref 8.9–10.3)
Chloride: 105 mmol/L (ref 98–111)
Creatinine, Ser: 0.89 mg/dL (ref 0.61–1.24)
GFR, Estimated: >60 mL/min (ref >60)
Glucose, Bld: 73 mg/dL (ref 70–99)
Potassium: 4 mmol/L (ref 3.5–5.1)
Sodium: 132 mmol/L — ABNORMAL LOW (ref 135–145)

## 2020-09-17 NOTE — Progress Notes (Signed)
Physical Therapy Treatment Patient Details Name: Dustin Howell MRN: [id number] DOB: August 07, 1924 Today's Date: 09/17/2020    History of Present Illness Pt is a 87 y.o. male presenting to ED d/t abnormal labs, diarrhea for about 1 month (recent C-diff infection), persistent generalized weakness, and SOB. S/p R IMN intertrochantric 07/21/20.  Pt admitted with severe sepsis d/t recurrent C. difficile colitis, acute respiratory failure with hypoxia, AKI, hyponatremia, hyperkalemia. Also diagnosed with acute PE but after 2 doses of Eliquis pt with acute GI bleed so anti-coagulation stopped; declined IVC filter.  PMH includes h/o GI bleed (Dec 2021), HOH (R sided hearing aid), anemia, lymphoproliferative disorder, CKD, B rotator cuff issues.    PT Comments    Pt was long sitting in bed upon arriving. He agrees to PT session and is cooperative and pleasant throughout. Pt is HOH but able to follow commands consistently. Oriented x 2 but very motivated to return home at DC. He required min assist to exit bed. CGA to stand and ambulate in room ~ 40 ft. Slight unsteadiness noted during ambulation however no intervention required. Recommend SNF at DC however family/pt wanting to return home. Recommend HHPT + 24/7care if pt DCs home. He would benefit from having hospital bed, RW, and w/c for safety. Returned to long sitting at conclusion of session with call bell in reach, bed alarm in place and RN staff aware of pt's abilities.    Follow Up Recommendations  SNF;Supervision/Assistance - 24 hour;Supervision for mobility/OOB;Other (comment) (If pt is to DC home, recommend 24/7 assistance)     Equipment Recommendations  Rolling walker with 5" wheels;3in1 (PT);Wheelchair (measurements PT);Wheelchair cushion (measurements PT);Hospital bed    Recommendations for Other Services       Precautions / Restrictions Precautions Precautions: Fall Restrictions Weight Bearing Restrictions: No    Mobility  Bed  Mobility Overal bed mobility: Needs Assistance Bed Mobility: Sit to Supine;Supine to Sit       Sit to supine: HOB elevated;Min assist   General bed mobility comments: Pt was able pt exit L side of bed with min assist + increased time.    Transfers Overall transfer level: Needs assistance Equipment used: Rolling walker (2 wheeled) Transfers: Sit to/from Stand Sit to Stand: Min guard         General transfer comment: Pt was able to stand from only slightly elevated ebd height. vcs for technique. no actual lifting assistance required to stand.  Ambulation/Gait Ambulation/Gait assistance: Min guard Gait Distance (Feet): 40 Feet Assistive device: Rolling walker (2 wheeled) Gait Pattern/deviations: Step-through pattern;Trunk flexed;Narrow base of support Gait velocity: decreased   General Gait Details: Pt was able to ambulate ~ 40 ft in room without CGA for safety. poor gait posture overall but pt did tolerate well without SOB or medical concern.       Balance Overall balance assessment: Needs assistance Sitting-balance support: No upper extremity supported;Feet supported Sitting balance-Leahy Scale: Good Sitting balance - Comments: no LOB in sitting   Standing balance support: Bilateral upper extremity supported;During functional activity Standing balance-Leahy Scale: Fair Standing balance comment: Pt was able to stand and ambulate with some unsteadiness at times however no intervention to correct       Cognition Arousal/Alertness: Awake/alert Behavior During Therapy: WFL for tasks assessed/performed Overall Cognitive Status: Within Functional Limits for tasks assessed            General Comments: Pt is A and cooperative. HOH but able to follow commands consistently throughout  Pertinent Vitals/Pain Pain Assessment: No/denies pain           PT Goals (current goals can now be found in the care plan section) Acute Rehab PT Goals Patient Stated  Goal: to go home Progress towards PT goals: Progressing toward goals    Frequency    Min 2X/week      PT Plan Current plan remains appropriate       AM-PAC PT "6 Clicks" Mobility   Outcome Measure  Help needed turning from your back to your side while in a flat bed without using bedrails?: None Help needed moving from lying on your back to sitting on the side of a flat bed without using bedrails?: A Little Help needed moving to and from a bed to a chair (including a wheelchair)?: A Little Help needed standing up from a chair using your arms (e.g., wheelchair or bedside chair)?: A Little Help needed to walk in hospital room?: A Little Help needed climbing 3-5 steps with a railing? : A Little 6 Click Score: 19    End of Session Equipment Utilized During Treatment: Gait belt Activity Tolerance: Patient tolerated treatment well Patient left: in bed;with call bell/phone within reach;with bed alarm set;Other (comment) Nurse Communication: Mobility status PT Visit Diagnosis: Other abnormalities of gait and mobility (R26.89);Muscle weakness (generalized) (M62.81);Difficulty in walking, not elsewhere classified (R26.2);History of falling (Z91.81)     Time: 7034-0352 PT Time Calculation (min) (ACUTE ONLY): 18 min  Charges:  $Gait Training: 8-22 mins                     Julaine Fusi PTA 09/17/20, 3:43 PM

## 2020-09-17 NOTE — Plan of Care (Signed)
Patient and daughter updated on POC.

## 2020-09-17 NOTE — Progress Notes (Signed)
Progress Note    Dustin Howell  STM:[id number] DOB: 1925/03/27  DOA: 09/12/2020 PCP: [name], MD      Brief Narrative:    Medical records reviewed and are as summarized below:  Dustin Howell is a 87 y.o. male  with medical history significant for history of C. difficile colitis, CAD s/p DES, lymphoproliferative disorder, recent lower GI bleed, recent C. difficile infection, hearing impairment, recent discharge from the hospital on 07/26/2020 after hospitalization for right hip fracture s/p IM nailing on 07/21/2020.  He had recently seen Dr. [name] on 09/10/2020 for recurrent diarrhea and he was restarted on oral vancomycin.  He presented to the hospital with frequent diarrhea.  He was admitted to the hospital for severe sepsis secondary to recurrent C. difficile colitis.  He was treated with fidaxomicin, IV Flagyl and IV fluids. He was also found to have left-sided pulmonary embolism, hyponatremia and AKI complicated by hyperkalemia.  After thorough discussion with patient and family regarding management of pulmonary embolism, they decided to proceed with treatment with Eliquis.  However, patient developed acute GI bleeding after 2 doses of Eliquis.  Aspirin and Eliquis were discontinued.  Patient and her daughter declined IVC filter and they do not want any endoscopic work-up.    Assessment/Plan:   Principal Problem:   Severe sepsis (HCC) Active Problems:   Acute lower GI bleeding   CAD (coronary artery disease)   Protein-calorie malnutrition, severe   Recurrent colitis due to Clostridium difficile   AKI (acute kidney injury) ([place])   Hyponatremia   Hyperkalemia   Body mass index is 20.58 kg/m.    Severe sepsis due to recurrent C. difficile colitis, leukocytosis: Continue fidaxomicin and Flagyl. Discontinue IV fluids   Acute pulmonary embolism: Venous duplex of bilateral lower extremities did not show any evidence of DVT. Eliquis discontinued because of GI  bleeding  Acute GI bleeding/rectal bleeding, history of GI bleed: H&H is stable. Continue to monitor H&H.Marland Kitchen Patient and daughter declined any endoscopic work-up. Flex sig showed AVM and diverticula in December 2021.  According to his daughter, he almost died from rectal bleeding about 5 years ago.  Thrombocytopenia: Platelet count is decreasing. Continue to monitor platelet count.  Hyponatremia: Sodium level is improving. Monitor BMP.  CAD s/p DES: Aspirin discontinued  because of GI bleeding  Severe protein calorie malnutrition, peripheral edema, scrotal edema, hypoalbuminemia: Encourage adequate oral intake.  Continue feeding supplements  AKI, hyperkalemia and acute hypoxic respiratory failure: Resolved  S/p right hip IM nailing on 07/21/2020.       Diet Order            DIET - DYS 1 Room service appropriate? Yes; Fluid consistency: Thin  Diet effective now                    Consultants:  None  Procedures:  None    Medications:   . atorvastatin  10 mg Oral Daily  . feeding supplement  237 mL Oral BID BM  . fidaxomicin  200 mg Oral BID  . sodium chloride flush  10-40 mL Intracatheter Q12H   Continuous Infusions: . lactated ringers 50 mL/hr at 09/16/20 1743  . metronidazole 500 mg (09/17/20 0840)     Anti-infectives (From admission, onward)   Start     Dose/Rate Route Frequency Ordered Stop   11/08/20 1000  vancomycin (VANCOCIN) 50 mg/mL oral solution 125 mg  Status:  Discontinued       "Followed by"  Linked Group Details   125 mg Oral Every 3 DAYS 09/12/20 2017 09/13/20 0955   10/11/20 1000  vancomycin (VANCOCIN) 50 mg/mL oral solution 125 mg  Status:  Discontinued       "Followed by" Linked Group Details   125 mg Oral Every other day 09/12/20 2017 09/13/20 0955   10/04/20 1000  vancomycin (VANCOCIN) 50 mg/mL oral solution 125 mg  Status:  Discontinued       "Followed by" Linked Group Details   125 mg Oral Daily 09/12/20 2017 09/13/20 0955   09/27/20  1000  vancomycin (VANCOCIN) 50 mg/mL oral solution 125 mg  Status:  Discontinued       "Followed by" Linked Group Details   125 mg Oral 2 times daily 09/12/20 2017 09/13/20 0955   09/13/20 1200  fidaxomicin (DIFICID) tablet 200 mg        200 mg Oral 2 times daily 09/13/20 0955 09/23/20 0959   09/13/20 0000  metroNIDAZOLE (FLAGYL) IVPB 500 mg        500 mg 100 mL/hr over 60 Minutes Intravenous Every 8 hours 09/12/20 2017     09/12/20 2200  vancomycin (VANCOCIN) 50 mg/mL oral solution 125 mg  Status:  Discontinued       "Followed by" Linked Group Details   125 mg Oral 4 times daily 09/12/20 2017 09/13/20 0955   09/12/20 1545  ceFEPIme (MAXIPIME) 2 g in sodium chloride 0.9 % 100 mL IVPB        2 g 200 mL/hr over 30 Minutes Intravenous  Once 09/12/20 1537 09/12/20 1558   09/12/20 1545  metroNIDAZOLE (FLAGYL) IVPB 500 mg        500 mg 100 mL/hr over 60 Minutes Intravenous  Once 09/12/20 1537 09/12/20 1639             Family Communication/Anticipated D/C date and plan/Code Status   DVT prophylaxis:      Code Status: DNR  Family Communication: None Disposition Plan:    Status is: Inpatient  Remains inpatient appropriate because:IV treatments appropriate due to intensity of illness or inability to take PO   Dispo: The patient is from: Home              Anticipated d/c is to: Home              Patient currently is not medically stable to d/c.   Difficult to place patient No           Subjective:   Interval events noted. He still has diarrhea although it appears volume of stools are decreasing. He had about 4 watery, brownish-red stools overnight.  Objective:    Vitals:   09/16/20 2320 09/17/20 0331 09/17/20 0500 09/17/20 0759  BP: 119/66 119/67  120/73  Pulse: 95 93  93  Resp: 16 16  16   Temp: 98 F (36.7 C) 97.8 F (36.6 C)  98.2 F (36.8 C)  TempSrc: Oral Oral    SpO2: 95% 95%  96%  Weight:   56.1 kg   Height:       No data  found.   Intake/Output Summary (Last 24 hours) at 09/17/2020 1109 Last data filed at 09/17/2020 0400 Gross per 24 hour  Intake 2823.84 ml  Output 250 ml  Net 2573.84 ml   Filed Weights   09/12/20 2110 09/13/20 0500 09/17/20 0500  Weight: 52.6 kg 52.1 kg 56.1 kg    Exam:  GEN: NAD SKIN: Warm and dry EYES: No pallor or icterus  ENT: MMM CV: RRR PULM: CTA B ABD: soft, ND, NT, +BS CNS: AAO x 3, non focal EXT: Bilateral lower extremity edema from the thighs to the feet. No erythema or tenderness. GU: Scrotal edema without erythema or tenderness      Data Reviewed:   I have personally reviewed following labs and imaging studies:  Labs: Labs show the following:   Basic Metabolic Panel: Recent Labs  Lab 09/13/20 0525 09/14/20 0535 09/15/20 0525 09/16/20 0905 09/17/20 0743  NA 132* 129* 129* 129* 132*  K 5.0 4.5 4.4 4.6 4.0  CL 104 102 103 104 105  CO2 22 20* 22 19* 22  GLUCOSE 70 75 84 94 73  BUN 27* 21 24* 21 18  CREATININE 1.05 1.05 0.87 0.94 0.89  CALCIUM 7.0* 7.1* 7.1* 7.5* 7.2*  MG 2.3 2.1  --  2.0  --   PHOS  --  3.3  --  2.7  --    GFR Estimated Creatinine Clearance: 40.3 mL/min (by C-G formula based on SCr of 0.89 mg/dL). Liver Function Tests: Recent Labs  Lab 09/12/20 1521 09/13/20 0525  AST 37 26  ALT 17 12  ALKPHOS 223* 170*  BILITOT 1.1 1.0  PROT 5.5* 4.2*  ALBUMIN 2.0* 1.6*   No results for input(s): LIPASE, AMYLASE in the last 168 hours. No results for input(s): AMMONIA in the last 168 hours. Coagulation profile Recent Labs  Lab 09/12/20 1521  INR 1.0    CBC: Recent Labs  Lab 09/12/20 1521 09/13/20 0525 09/14/20 0535 09/15/20 0525 09/15/20 1240 09/16/20 0905 09/17/20 0743  WBC 36.0* 22.6* 18.0* 15.3*  --  21.0* 13.6*  NEUTROABS 33.8*  --  9.1* 5.6  --  6.1 4.8  HGB 12.3* 11.0* 10.1* 10.1* 10.8* 10.7* 9.4*  HCT 38.8* 33.6* 31.2* 29.7* 32.5* 33.2* 29.4*  MCV 88.6 87.7 87.9 86.1  --  89.0 89.9  PLT 185 139* 134* 136*  --   132* 112*   Cardiac Enzymes: No results for input(s): CKTOTAL, CKMB, CKMBINDEX, TROPONINI in the last 168 hours. BNP (last 3 results) No results for input(s): PROBNP in the last 8760 hours. CBG: Recent Labs  Lab 09/13/20 0647 09/13/20 0658 09/13/20 0700 09/13/20 0705 09/13/20 0728  GLUCAP 52* 55* 65* 70 74   D-Dimer: No results for input(s): DDIMER in the last 72 hours. Hgb A1c: No results for input(s): HGBA1C in the last 72 hours. Lipid Profile: No results for input(s): CHOL, HDL, LDLCALC, TRIG, CHOLHDL, LDLDIRECT in the last 72 hours. Thyroid function studies: No results for input(s): TSH, T4TOTAL, T3FREE, THYROIDAB in the last 72 hours.  Invalid input(s): FREET3 Anemia work up: No results for input(s): VITAMINB12, FOLATE, FERRITIN, TIBC, IRON, RETICCTPCT in the last 72 hours. Sepsis Labs: Recent Labs  Lab 09/12/20 1521 09/13/20 0525 09/14/20 0535 09/15/20 0525 09/16/20 0905 09/17/20 0743  PROCALCITON  --  1.94  --   --   --   --   WBC 36.0* 22.6* 18.0* 15.3* 21.0* 13.6*  LATICACIDVEN 2.8*  4.1*  --  1.2  --   --   --     Microbiology Recent Results (from the past 240 hour(s))  Culture, blood (Routine x 2)     Status: None   Collection Time: 09/12/20  3:21 PM   Specimen: BLOOD  Result Value Ref Range Status   Specimen Description BLOOD RIGHT ANTECUBITAL  Final   Special Requests   Final    BOTTLES DRAWN AEROBIC AND ANAEROBIC Blood Culture adequate volume  Culture   Final    NO GROWTH 5 DAYS Performed at [facility], [address] [place], [place] [address]    Report Status 09/17/2020 FINAL  Final  Culture, blood (Routine x 2)     Status: None   Collection Time: 09/12/20  3:21 PM   Specimen: BLOOD  Result Value Ref Range Status   Specimen Description BLOOD LEFT ANTECUBITAL  Final   Special Requests   Final    BOTTLES DRAWN AEROBIC AND ANAEROBIC Blood Culture adequate volume   Culture   Final    NO GROWTH 5 DAYS Performed at [facility], [address] [place], [place] [address]    Report Status 09/17/2020 FINAL  Final  Resp Panel by RT-PCR (Flu A&B, Covid) Nasopharyngeal Swab     Status: None   Collection Time: 09/12/20  3:21 PM   Specimen: Nasopharyngeal Swab; Nasopharyngeal(NP) swabs in vial transport medium  Result Value Ref Range Status   SARS Coronavirus 2 by RT PCR NEGATIVE NEGATIVE Final    Comment: (NOTE) SARS-CoV-2 target nucleic acids are NOT DETECTED.  The SARS-CoV-2 RNA is generally detectable in upper respiratory specimens during the acute phase of infection. The lowest concentration of SARS-CoV-2 viral copies this assay can detect is 138 copies/mL. A negative result does not preclude SARS-Cov-2 infection and should not be used as the sole basis for treatment or other patient management decisions. A negative result may occur with  improper specimen collection/handling, submission of specimen other than nasopharyngeal swab, presence of viral mutation(s) within the areas targeted by this assay, and inadequate number of viral copies(<138 copies/mL). A negative result must be combined with clinical observations, patient history, and epidemiological information. The expected result is Negative.  Fact Sheet for Patients:  EntrepreneurPulse.com.au  Fact Sheet for Healthcare Providers:  IncredibleEmployment.be  This test is no t yet approved or cleared by the [place] FDA and  has been authorized for detection and/or diagnosis of SARS-CoV-2 by FDA under an Emergency Use Authorization (EUA). This EUA will remain  in effect (meaning this test can be used) for the duration of the COVID-19 declaration under Section 564(b)(1) of the Act, 21 U.S.C.section 360bbb-3(b)(1), unless the authorization is terminated  or revoked sooner.       Influenza A by PCR NEGATIVE NEGATIVE Final   Influenza B by PCR NEGATIVE NEGATIVE Final    Comment: (NOTE) The Xpert  Xpress SARS-CoV-2/FLU/RSV plus assay is intended as an aid in the diagnosis of influenza from Nasopharyngeal swab specimens and should not be used as a sole basis for treatment. Nasal washings and aspirates are unacceptable for Xpert Xpress SARS-CoV-2/FLU/RSV testing.  Fact Sheet for Patients: EntrepreneurPulse.com.au  Fact Sheet for Healthcare Providers: IncredibleEmployment.be  This test is not yet approved or cleared by the [place] FDA and has been authorized for detection and/or diagnosis of SARS-CoV-2 by FDA under an Emergency Use Authorization (EUA). This EUA will remain in effect (meaning this test can be used) for the duration of the COVID-19 declaration under Section 564(b)(1) of the Act, 21 U.S.C. section 360bbb-3(b)(1), unless the authorization is terminated or revoked.  Performed at [facility], [address] [place],  [address]   Gastrointestinal Panel by PCR , Stool     Status: None   Collection Time: 09/12/20  3:21 PM   Specimen: STOOL  Result Value Ref Range Status   Campylobacter species NOT DETECTED NOT DETECTED Final   Plesimonas shigelloides NOT DETECTED NOT DETECTED Final  Salmonella species NOT DETECTED NOT DETECTED Final   Yersinia enterocolitica NOT DETECTED NOT DETECTED Final   Vibrio species NOT DETECTED NOT DETECTED Final   Vibrio cholerae NOT DETECTED NOT DETECTED Final   Enteroaggregative E coli (EAEC) NOT DETECTED NOT DETECTED Final   Enteropathogenic E coli (EPEC) NOT DETECTED NOT DETECTED Final   Enterotoxigenic E coli (ETEC) NOT DETECTED NOT DETECTED Final   Shiga like toxin producing E coli (STEC) NOT DETECTED NOT DETECTED Final   Shigella/Enteroinvasive E coli (EIEC) NOT DETECTED NOT DETECTED Final   Cryptosporidium NOT DETECTED NOT DETECTED Final   Cyclospora cayetanensis NOT DETECTED NOT DETECTED Final   Entamoeba histolytica NOT DETECTED NOT DETECTED Final   Giardia lamblia NOT  DETECTED NOT DETECTED Final   Adenovirus F40/41 NOT DETECTED NOT DETECTED Final   Astrovirus NOT DETECTED NOT DETECTED Final   Norovirus GI/GII NOT DETECTED NOT DETECTED Final   Rotavirus A NOT DETECTED NOT DETECTED Final   Sapovirus (I, II, IV, and V) NOT DETECTED NOT DETECTED Final    Comment: Performed at [facility], [address] [place], [place] [address]  C Difficile Quick Screen w PCR reflex     Status: Abnormal   Collection Time: 09/12/20  3:21 PM   Specimen: STOOL  Result Value Ref Range Status   C Diff antigen POSITIVE (A) NEGATIVE Final   C Diff toxin POSITIVE (A) NEGATIVE Final   C Diff interpretation Toxin producing C. difficile detected.  Final    Comment: CRITICAL RESULT CALLED TO, READ BACK BY AND VERIFIED WITH: RAQUEL DAVID @2057  ON 09/12/20 SKL Performed at [facility], [address] [place],  [address]     Procedures and diagnostic studies:  No results found.             LOS: 5 days   Vasilios Ottaway  [facility]   Pager on [email or web address]. If 7PM-7AM, please contact night-coverage at [email or web address]     09/17/2020, 11:09 AM

## 2020-09-17 NOTE — Progress Notes (Signed)
Spoke with PT Dustin Howell this AM. He will try to work with patient this afternoon. If not, will plan to see patient tomorrow. Daughter would like to bring him home with support of home health/PT on discharge, but Dustin Howell needs to tolerate ambulating short distances in order for her to care for him at home. Appreciate assistance from physical therapy. Recommend outpatient palliative follow-up.   NO CHARGE  Ihor Dow, [place], FNP-C Palliative Medicine Team  Phone: [phone] Fax: [phone]

## 2020-09-18 DIAGNOSIS — A419 Sepsis, unspecified organism: Secondary | ICD-10-CM | POA: Diagnosis not present

## 2020-09-18 DIAGNOSIS — R6520 Severe sepsis without septic shock: Secondary | ICD-10-CM | POA: Diagnosis not present

## 2020-09-18 LAB — CBC WITH DIFFERENTIAL/PLATELET
Abs Immature Granulocytes: 3.21 10*3/uL — ABNORMAL HIGH (ref 0.00–0.07)
Basophils Absolute: 0.3 10*3/uL — ABNORMAL HIGH (ref 0.0–0.1)
Basophils Relative: 1 %
Eosinophils Absolute: 0 10*3/uL (ref 0.0–0.5)
Eosinophils Relative: 0 %
HCT: 34.3 % — ABNORMAL LOW (ref 39.0–52.0)
Hemoglobin: 10.8 g/dL — ABNORMAL LOW (ref 13.0–17.0)
Immature Granulocytes: 14 %
Lymphocytes Relative: 31 %
Lymphs Abs: 6.9 10*3/uL — ABNORMAL HIGH (ref 0.7–4.0)
MCH: 28.6 pg (ref 26.0–34.0)
MCHC: 31.5 g/dL (ref 30.0–36.0)
MCV: 90.7 fL (ref 80.0–100.0)
Monocytes Absolute: 5.3 10*3/uL — ABNORMAL HIGH (ref 0.1–1.0)
Monocytes Relative: 24 %
Neutro Abs: 6.7 10*3/uL (ref 1.7–7.7)
Neutrophils Relative %: 30 %
Platelets: 122 10*3/uL — ABNORMAL LOW (ref 150–400)
RBC: 3.78 MIL/uL — ABNORMAL LOW (ref 4.22–5.81)
RDW: 18.6 % — ABNORMAL HIGH (ref 11.5–15.5)
Smear Review: NORMAL
WBC: 22.4 10*3/uL — ABNORMAL HIGH (ref 4.0–10.5)
nRBC: 0.4 % — ABNORMAL HIGH (ref 0.0–0.2)

## 2020-09-18 LAB — BASIC METABOLIC PANEL
Anion gap: 6 (ref 5–15)
BUN: 20 mg/dL (ref 8–23)
CO2: 20 mmol/L — ABNORMAL LOW (ref 22–32)
Calcium: 7.4 mg/dL — ABNORMAL LOW (ref 8.9–10.3)
Chloride: 104 mmol/L (ref 98–111)
Creatinine, Ser: 0.95 mg/dL (ref 0.61–1.24)
GFR, Estimated: >60 mL/min (ref >60)
Glucose, Bld: 85 mg/dL (ref 70–99)
Potassium: 4.5 mmol/L (ref 3.5–5.1)
Sodium: 130 mmol/L — ABNORMAL LOW (ref 135–145)

## 2020-09-18 LAB — MAGNESIUM: Magnesium: 2 mg/dL (ref 1.7–2.4)

## 2020-09-18 LAB — PHOSPHORUS: Phosphorus: 2.8 mg/dL (ref 2.5–4.6)

## 2020-09-18 MED ORDER — RISAQUAD PO CAPS
1.0000 | ORAL_CAPSULE | Freq: Every day | ORAL | Status: DC
  Administered 2020-09-18 – 2020-09-23 (×6): 1 via ORAL
  Filled 2020-09-18 (×6): qty 1

## 2020-09-18 MED ORDER — SODIUM CHLORIDE 0.9 % IV SOLN: INTRAVENOUS | Status: DC

## 2020-09-18 MED ORDER — CHOLESTYRAMINE 4 G PO PACK
4.0000 g | PACK | Freq: Two times a day (BID) | ORAL | Status: DC
  Administered 2020-09-18 – 2020-09-23 (×9): 4 g via ORAL
  Filled 2020-09-18 (×12): qty 1

## 2020-09-18 NOTE — TOC Initial Note (Signed)
Transition of Care [facility]) - Initial/Assessment Note    Patient Details  Name: Dustin Howell MRN: [id number] Date of Birth: 1925-04-10  [facility]) CM/SW Contact:    Shelbie Hutching, RN Phone Number: 09/18/2020, 4:34 PM  Clinical Narrative:                 Patient admitted to the hospital with Sepsis.  Patient's daughter was at the bedside this morning and reports that patient has been dealing with C diff for a while now.  Patient is from home, his daughter Dustin Howell lives with him.  Daughter would like to take the patient home even though SNF is recommended.  Daughter does not want the patient to have a wheelchair yet, he has bedside commode, shower chair and walker already at home.  Patient is open with [facility] for RN, PT, and aide.    Expected Discharge Plan: [place] Barriers to Discharge: Continued Medical Work up   Patient Goals and CMS Choice Patient states their goals for this hospitalization and ongoing recovery are:: Daughter wants to take the patient home with home health services CMS Medicare.gov Compare Post Acute Care list provided to:: Patient Represenative (must comment) Choice offered to / list presented to : Adult Children  Expected Discharge Plan and Services Expected Discharge Plan: [place] In-house Referral: Hospice / Palliative Care Discharge Planning Services: CM Consult Post Acute Care Choice: [place] arrangements for the past 2 months: Single Family Home                 DME Arranged: N/A DME Agency: NA       HH Arranged: RN,PT,OT [place] Agency: [facility] Date [place]: 09/18/20 Time [place]: 1632 Representative spoke with at [place]: [place] Arrangements/Services Living arrangements for the past 2 months: [place] with:: Adult Children (daughter) Patient language and need for interpreter reviewed:: Yes Do you feel safe going back to the  place where you live?: Yes      Need for Family Participation in Patient Care: Yes (Comment) (C diff) Care giver support system in place?: Yes (comment) (daughter) Current home services: DME (bedside commode, shower chair, walker) Criminal Activity/Legal Involvement Pertinent to Current Situation/Hospitalization: No - Comment as needed  Activities of Daily Living Home Assistive Devices/Equipment: Environmental consultant (specify type) ADL Screening (condition at time of admission) Patient's cognitive ability adequate to safely complete daily activities?: Yes Is the patient deaf or have difficulty hearing?: Yes Does the patient have difficulty seeing, even when wearing glasses/contacts?: No Does the patient have difficulty concentrating, remembering, or making decisions?: Yes Patient able to express need for assistance with ADLs?: Yes Does the patient have difficulty dressing or bathing?: Yes Independently performs ADLs?: No Communication: Needs assistance Is this a change from baseline?: Pre-admission baseline Dressing (OT): Needs assistance Is this a change from baseline?: Pre-admission baseline Grooming: Needs assistance Is this a change from baseline?: Pre-admission baseline Feeding: Independent Bathing: Needs assistance Is this a change from baseline?: Pre-admission baseline Toileting: Needs assistance Is this a change from baseline?: Pre-admission baseline In/Out Bed: Needs assistance Is this a change from baseline?: Pre-admission baseline Walks in Home: Needs assistance Is this a change from baseline?: Pre-admission baseline Does the patient have difficulty walking or climbing stairs?: Yes Weakness of Legs: Both Weakness of Arms/Hands: None  Permission Sought/Granted Permission sought to share information with : Case Manager,Family Supports,Other (comment) Permission granted  to share information with : Yes, Verbal Permission Granted  Share Information with NAME: Dustin Howell  Permission granted  to share info w AGENCY: [facility]  Permission granted to share info w Relationship: daughter     Emotional Assessment Appearance:: Appears stated age Attitude/Demeanor/Rapport: Engaged Affect (typically observed): Accepting Orientation: : Oriented to Self,Oriented to Place Alcohol / Substance Use: Not Applicable Psych Involvement: No (comment)  Admission diagnosis:  C. difficile colitis [A04.72] Severe sepsis ([place]) [A41.9, R65.20] Failure of outpatient treatment [Z78.9] Sepsis without acute organ dysfunction, due to unspecified organism [facility]) [A41.9] Patient Active Problem List   Diagnosis Date Noted  . Severe sepsis ([place]) 09/12/2020  . Recurrent colitis due to Clostridium difficile 09/12/2020  . AKI (acute kidney injury) ([place]) 09/12/2020  . Hyponatremia 09/12/2020  . Hyperkalemia 09/12/2020  . Protein-calorie malnutrition, severe 07/22/2020  . Acute metabolic encephalopathy   . Iron deficiency anemia due to chronic blood loss   . Lymphoproliferative disorder ([place])   . Thrombocytopenia ([place])   . Essential hypertension   . Hyperlipidemia   . Scalp laceration   . Stage 2 chronic kidney disease   . Closed fracture of right hip requiring operative repair ([place]) 07/20/2020  . Abnormal CT scan, colon   . Lower GI bleed 06/20/2020  . CAD (coronary artery disease) 04/27/2017  . 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 Chest pain on exertion 04/21/2017  . Abnormal CT of the abdomen   . Acute encephalopathy   . Hypokalemia   . Ileus ([place])   . Abdominal pain   . Acute lower GI bleeding 05/11/2016  . Gastrointestinal hemorrhage 05/11/2016  . Acute blood loss anemia   . Arterial hypotension   . Acute pain of left knee   . Slurred speech    PCP:  [name], MD Pharmacy:   [place], [place] [facility] [address] [address] Phone: [phone] Fax: [phone]     Social Determinants of Health (SDOH) Interventions     Readmission Risk Interventions No flowsheet data found.

## 2020-09-18 NOTE — Plan of Care (Signed)
End of shift summary:  Alert and oriented x2, reoriented to time and situation. Remained on room air, sats >93%. 1 BM overnight. UOP adequate. Denies pain or n/v. Remained free from falls or injury. Bed alarm on. Call bell within reach and able to use.    Problem: Education: Goal: Knowledge of General Education information will improve Description: Including pain rating scale, medication(s)/side effects and non-pharmacologic comfort measures Outcome: Progressing   Problem: Clinical Measurements: Goal: Ability to maintain clinical measurements within normal limits will improve Outcome: Progressing Goal: Will remain free from infection Outcome: Progressing Goal: Diagnostic test results will improve Outcome: Progressing Goal: Respiratory complications will improve Outcome: Progressing Goal: Cardiovascular complication will be avoided Outcome: Progressing   Problem: Activity: Goal: Risk for activity intolerance will decrease Outcome: Progressing   Problem: Nutrition: Goal: Adequate nutrition will be maintained Outcome: Progressing   Problem: Coping: Goal: Level of anxiety will decrease Outcome: Progressing   Problem: Safety: Goal: Ability to remain free from injury will improve Outcome: Progressing

## 2020-09-18 NOTE — Plan of Care (Signed)
POC reviewed with patient and daughter

## 2020-09-18 NOTE — Progress Notes (Signed)
PROGRESS NOTE    Dustin Howell  ZOX:[id number] DOB: 11-Dec-1924 DOA: 09/12/2020 PCP: [name], MD  Brief Narrative:  87 y.o. male with medical history significant for history of C. difficile colitis,CAD s/p DES, lymphoproliferative disorder, recent lower GI bleed, recent C. difficile infection, hearing impairment, recent discharge from the hospital on 07/26/2020 after hospitalization for right hip fracture s/p IM nailing on 07/21/2020.  He had recently seen Dr. [name] on 09/10/2020 for recurrent diarrhea and he was restarted on oral vancomycin.  He presented to the hospital with frequent diarrhea.  He was admitted to the hospital for severe sepsis secondary to recurrent C. difficile colitis.  He was treated with fidaxomicin, IV Flagyl and IV fluids. He was also found to have left-sided pulmonary embolism, hyponatremia and AKI complicated by hyperkalemia.  After thorough discussion with patient and family regarding management of pulmonary embolism, they decided to proceed with treatment with Eliquis.  However, patient developed acute GI bleeding after 2 doses of Eliquis.  Aspirin and Eliquis were discontinued.  Patient and her daughter declined IVC filter and they do not want any endoscopic work-up.  Patient is having persistent diarrhea, p.o. Questran added 3/9   Assessment & Plan:   Principal Problem:   Severe sepsis ([place]) Active Problems:   Acute lower GI bleeding   CAD (coronary artery disease)   Protein-calorie malnutrition, severe   Recurrent colitis due to Clostridium difficile   AKI (acute kidney injury) ([place])   Hyponatremia   Hyperkalemia  Recurrent C. difficile colitis Severe sepsis secondary to above Patient has had persistent diarrhea despite fidaxomicin and Flagyl Sepsis physiology resolving Leukocytosis persistent Plan: Continue vancomycin Continue Flagyl Add p.o. Questran 4 g twice daily Add probiotic 1 tab daily Gentle IV fluid resuscitation  Acute  pulmonary embolism No DVT on bilateral lower extremity duplex Eliquis discontinued because of GI bleeding Family elected to defer endoscopic evaluation Plan: Vitals per unit protocol  Acute GI bleeding/rectal bleeding history of GI bleed  After initiation of Eliquis for GI bleed Patient and daughter defer GI work-up Per daughter patient had a near fatal episode of GI bleeding 5 years ago H&H stable Plan: Defer anticoagulants Daily H&H  Thrombocytopenia No source of bleeding Possibly reactive thrombocytopenia in the setting of infection No indication for transfusion currently   Hyponatremia intravascular volume depletion versus SIADH Mild, asymptomatic Plan: Gentle IV fluids Daily BMP  CAD s/p DES Aspirin discontinued  because of GI bleeding  Severe protein calorie malnutrition [place] Registered dietitian consulted Continue protein enriched feeding supplements  AKI, hyperkalemia and acute hypoxic respiratory failure: Resolved  S/p right hip IM nailing on 07/21/2020.     DVT prophylaxis: SCDs Code Status: DNR Family Communication: Left VM for daughter [name] [phone] on 3/9 Disposition Plan: Status is: Inpatient  Remains inpatient appropriate because:Inpatient level of care appropriate due to severity of illness   Dispo: The patient is from: Home              Anticipated d/c is to: Home              Patient currently is not medically stable to d/c.   Difficult to place patient No  Persistent diarrhea in the setting of C. difficile colitis.     Level of care: Med-Surg  Consultants:   None  Procedures:   None  Antimicrobials:   Vancomycin  Metronidazole   Subjective: Patient seen and examined.  Reports persistent diarrhea.  No pain complaints.  Objective: Vitals:  09/18/20 0100 09/18/20 0622 09/18/20 0622 09/18/20 1135  BP:   137/78 130/72  Pulse:   98 97  Resp:   20 15  Temp:  98.1 F (36.7 C) 98.1 F (36.7 C)  98.5 F (36.9 C)  TempSrc:  Oral Oral   SpO2:   92% 98%  Weight: 58.9 kg     Height:        Intake/Output Summary (Last 24 hours) at 09/18/2020 1253 Last data filed at 09/18/2020 0600 Gross per 24 hour  Intake 300 ml  Output --  Net 300 ml   Filed Weights   09/13/20 0500 09/17/20 0500 09/18/20 0100  Weight: 52.1 kg 56.1 kg 58.9 kg    Examination:  General exam: Appears calm and comfortable  Respiratory system: Clear to auscultation. Respiratory effort normal. Cardiovascular system: S1 & S2 heard, RRR. No JVD, murmurs, rubs, gallops or clicks. No pedal edema. Gastrointestinal system: Soft, nontender, nondistended, hyperactive bowel sounds Central nervous system: Alert and oriented. No focal neurological deficits. Extremities: Scrotal edema, bilateral lower extremity edema Skin: No rashes, lesions or ulcers Psychiatry: Judgement and insight appear normal. Mood & affect appropriate.     Data Reviewed: I have personally reviewed following labs and imaging studies  CBC: Recent Labs  Lab 09/14/20 0535 09/15/20 0525 09/15/20 1240 09/16/20 0905 09/17/20 0743 09/18/20 0622  WBC 18.0* 15.3*  --  21.0* 13.6* 22.4*  NEUTROABS 9.1* 5.6  --  6.1 4.8 6.7  HGB 10.1* 10.1* 10.8* 10.7* 9.4* 10.8*  HCT 31.2* 29.7* 32.5* 33.2* 29.4* 34.3*  MCV 87.9 86.1  --  89.0 89.9 90.7  PLT 134* 136*  --  132* 112* 062*   Basic Metabolic Panel: Recent Labs  Lab 09/13/20 0525 09/14/20 0535 09/15/20 0525 09/16/20 0905 09/17/20 0743 09/18/20 0622  NA 132* 129* 129* 129* 132* 130*  K 5.0 4.5 4.4 4.6 4.0 4.5  CL 104 102 103 104 105 104  CO2 22 20* 22 19* 22 20*  GLUCOSE 70 75 84 94 73 85  BUN 27* 21 24* 21 18 20   CREATININE 1.05 1.05 0.87 0.94 0.89 0.95  CALCIUM 7.0* 7.1* 7.1* 7.5* 7.2* 7.4*  MG 2.3 2.1  --  2.0  --  2.0  PHOS  --  3.3  --  2.7  --  2.8   GFR: Estimated Creatinine Clearance: 39.6 mL/min (by C-G formula based on SCr of 0.95 mg/dL). Liver Function Tests: Recent Labs   Lab 09/12/20 1521 09/13/20 0525  AST 37 26  ALT 17 12  ALKPHOS 223* 170*  BILITOT 1.1 1.0  PROT 5.5* 4.2*  ALBUMIN 2.0* 1.6*   No results for input(s): LIPASE, AMYLASE in the last 168 hours. No results for input(s): AMMONIA in the last 168 hours. Coagulation Profile: Recent Labs  Lab 09/12/20 1521  INR 1.0   Cardiac Enzymes: No results for input(s): CKTOTAL, CKMB, CKMBINDEX, TROPONINI in the last 168 hours. BNP (last 3 results) No results for input(s): PROBNP in the last 8760 hours. HbA1C: No results for input(s): HGBA1C in the last 72 hours. CBG: Recent Labs  Lab 09/13/20 0647 09/13/20 0658 09/13/20 0700 09/13/20 0705 09/13/20 0728  GLUCAP 52* 55* 65* 70 74   Lipid Profile: No results for input(s): CHOL, HDL, LDLCALC, TRIG, CHOLHDL, LDLDIRECT in the last 72 hours. Thyroid Function Tests: No results for input(s): TSH, T4TOTAL, FREET4, T3FREE, THYROIDAB in the last 72 hours. Anemia Panel: No results for input(s): VITAMINB12, FOLATE, FERRITIN, TIBC, IRON, RETICCTPCT in the last 72  hours. Sepsis Labs: Recent Labs  Lab 09/12/20 1521 09/13/20 0525 09/14/20 0535  PROCALCITON  --  1.94  --   LATICACIDVEN 2.8*  4.1*  --  1.2    Recent Results (from the past 240 hour(s))  Culture, blood (Routine x 2)     Status: None   Collection Time: 09/12/20  3:21 PM   Specimen: BLOOD  Result Value Ref Range Status   Specimen Description BLOOD RIGHT ANTECUBITAL  Final   Special Requests   Final    BOTTLES DRAWN AEROBIC AND ANAEROBIC Blood Culture adequate volume   Culture   Final    NO GROWTH 5 DAYS Performed at [facility], [address] [place], [place] [address]    Report Status 09/17/2020 FINAL  Final  Culture, blood (Routine x 2)     Status: None   Collection Time: 09/12/20  3:21 PM   Specimen: BLOOD  Result Value Ref Range Status   Specimen Description BLOOD LEFT ANTECUBITAL  Final   Special Requests   Final    BOTTLES DRAWN AEROBIC AND ANAEROBIC  Blood Culture adequate volume   Culture   Final    NO GROWTH 5 DAYS Performed at [facility], [address] [place], [place] [address]    Report Status 09/17/2020 FINAL  Final  Resp Panel by RT-PCR (Flu A&B, Covid) Nasopharyngeal Swab     Status: None   Collection Time: 09/12/20  3:21 PM   Specimen: Nasopharyngeal Swab; Nasopharyngeal(NP) swabs in vial transport medium  Result Value Ref Range Status   SARS Coronavirus 2 by RT PCR NEGATIVE NEGATIVE Final    Comment: (NOTE) SARS-CoV-2 target nucleic acids are NOT DETECTED.  The SARS-CoV-2 RNA is generally detectable in upper respiratory specimens during the acute phase of infection. The lowest concentration of SARS-CoV-2 viral copies this assay can detect is 138 copies/mL. A negative result does not preclude SARS-Cov-2 infection and should not be used as the sole basis for treatment or other patient management decisions. A negative result may occur with  improper specimen collection/handling, submission of specimen other than nasopharyngeal swab, presence of viral mutation(s) within the areas targeted by this assay, and inadequate number of viral copies(<138 copies/mL). A negative result must be combined with clinical observations, patient history, and epidemiological information. The expected result is Negative.  Fact Sheet for Patients:  EntrepreneurPulse.com.au  Fact Sheet for Healthcare Providers:  IncredibleEmployment.be  This test is no t yet approved or cleared by the [place] FDA and  has been authorized for detection and/or diagnosis of SARS-CoV-2 by FDA under an Emergency Use Authorization (EUA). This EUA will remain  in effect (meaning this test can be used) for the duration of the COVID-19 declaration under Section 564(b)(1) of the Act, 21 U.S.C.section 360bbb-3(b)(1), unless the authorization is terminated  or revoked sooner.       Influenza A by PCR NEGATIVE  NEGATIVE Final   Influenza B by PCR NEGATIVE NEGATIVE Final    Comment: (NOTE) The Xpert Xpress SARS-CoV-2/FLU/RSV plus assay is intended as an aid in the diagnosis of influenza from Nasopharyngeal swab specimens and should not be used as a sole basis for treatment. Nasal washings and aspirates are unacceptable for Xpert Xpress SARS-CoV-2/FLU/RSV testing.  Fact Sheet for Patients: EntrepreneurPulse.com.au  Fact Sheet for Healthcare Providers: IncredibleEmployment.be  This test is not yet approved or cleared by the [place] FDA and has been authorized for detection and/or diagnosis of SARS-CoV-2 by FDA under an Emergency Use Authorization (  EUA). This EUA will remain in effect (meaning this test can be used) for the duration of the COVID-19 declaration under Section 564(b)(1) of the Act, 21 U.S.C. section 360bbb-3(b)(1), unless the authorization is terminated or revoked.  Performed at [facility], [address] [place], [place] [address]   Gastrointestinal Panel by PCR , Stool     Status: None   Collection Time: 09/12/20  3:21 PM   Specimen: STOOL  Result Value Ref Range Status   Campylobacter species NOT DETECTED NOT DETECTED Final   Plesimonas shigelloides NOT DETECTED NOT DETECTED Final   Salmonella species NOT DETECTED NOT DETECTED Final   Yersinia enterocolitica NOT DETECTED NOT DETECTED Final   Vibrio species NOT DETECTED NOT DETECTED Final   Vibrio cholerae NOT DETECTED NOT DETECTED Final   Enteroaggregative E coli (EAEC) NOT DETECTED NOT DETECTED Final   Enteropathogenic E coli (EPEC) NOT DETECTED NOT DETECTED Final   Enterotoxigenic E coli (ETEC) NOT DETECTED NOT DETECTED Final   Shiga like toxin producing E coli (STEC) NOT DETECTED NOT DETECTED Final   Shigella/Enteroinvasive E coli (EIEC) NOT DETECTED NOT DETECTED Final   Cryptosporidium NOT DETECTED NOT DETECTED Final   Cyclospora cayetanensis NOT DETECTED NOT  DETECTED Final   Entamoeba histolytica NOT DETECTED NOT DETECTED Final   Giardia lamblia NOT DETECTED NOT DETECTED Final   Adenovirus F40/41 NOT DETECTED NOT DETECTED Final   Astrovirus NOT DETECTED NOT DETECTED Final   Norovirus GI/GII NOT DETECTED NOT DETECTED Final   Rotavirus A NOT DETECTED NOT DETECTED Final   Sapovirus (I, II, IV, and V) NOT DETECTED NOT DETECTED Final    Comment: Performed at [facility], [address] [place], [place] [address]  C Difficile Quick Screen w PCR reflex     Status: Abnormal   Collection Time: 09/12/20  3:21 PM   Specimen: STOOL  Result Value Ref Range Status   C Diff antigen POSITIVE (A) NEGATIVE Final   C Diff toxin POSITIVE (A) NEGATIVE Final   C Diff interpretation Toxin producing C. difficile detected.  Final    Comment: CRITICAL RESULT CALLED TO, READ BACK BY AND VERIFIED WITH: RAQUEL DAVID @2057  ON 09/12/20 SKL Performed at [facility], [address], [place],  [address]          Radiology Studies: No results found.      Scheduled Meds: . atorvastatin  10 mg Oral Daily  . cholestyramine  4 g Oral BID  . feeding supplement  237 mL Oral BID BM  . fidaxomicin  200 mg Oral BID  . sodium chloride flush  10-40 mL Intracatheter Q12H   Continuous Infusions:   LOS: 6 days    Time spent: 25 minutes    Sidney Ace, MD [facility] Pager [phone]  If 7PM-7AM, please contact night-coverage 09/18/2020, 12:53 PM

## 2020-09-19 DIAGNOSIS — A419 Sepsis, unspecified organism: Secondary | ICD-10-CM | POA: Diagnosis not present

## 2020-09-19 DIAGNOSIS — R6520 Severe sepsis without septic shock: Secondary | ICD-10-CM | POA: Diagnosis not present

## 2020-09-19 LAB — CBC WITH DIFFERENTIAL/PLATELET
Abs Immature Granulocytes: 0.4 10*3/uL — ABNORMAL HIGH (ref 0.00–0.07)
Band Neutrophils: 2 %
Basophils Absolute: 0 10*3/uL (ref 0.0–0.1)
Basophils Relative: 0 %
Eosinophils Absolute: 0 10*3/uL (ref 0.0–0.5)
Eosinophils Relative: 0 %
HCT: 30 % — ABNORMAL LOW (ref 39.0–52.0)
Hemoglobin: 9.6 g/dL — ABNORMAL LOW (ref 13.0–17.0)
Lymphocytes Relative: 21 %
Lymphs Abs: 3.8 10*3/uL (ref 0.7–4.0)
MCH: 28.8 pg (ref 26.0–34.0)
MCHC: 32 g/dL (ref 30.0–36.0)
MCV: 90.1 fL (ref 80.0–100.0)
Metamyelocytes Relative: 1 %
Monocytes Absolute: 1.1 10*3/uL — ABNORMAL HIGH (ref 0.1–1.0)
Monocytes Relative: 6 %
Myelocytes: 1 %
Neutro Abs: 8.6 10*3/uL — ABNORMAL HIGH (ref 1.7–7.7)
Neutrophils Relative %: 46 %
Other: 23 %
Platelets: 125 10*3/uL — ABNORMAL LOW (ref 150–400)
RBC: 3.33 MIL/uL — ABNORMAL LOW (ref 4.22–5.81)
RDW: 18.5 % — ABNORMAL HIGH (ref 11.5–15.5)
Smear Review: NORMAL
WBC: 17.9 10*3/uL — ABNORMAL HIGH (ref 4.0–10.5)
nRBC: 0.4 % — ABNORMAL HIGH (ref 0.0–0.2)
nRBC: 1 /100 WBC — ABNORMAL HIGH

## 2020-09-19 LAB — BASIC METABOLIC PANEL
Anion gap: 7 (ref 5–15)
BUN: 21 mg/dL (ref 8–23)
CO2: 19 mmol/L — ABNORMAL LOW (ref 22–32)
Calcium: 7 mg/dL — ABNORMAL LOW (ref 8.9–10.3)
Chloride: 104 mmol/L (ref 98–111)
Creatinine, Ser: 0.9 mg/dL (ref 0.61–1.24)
GFR, Estimated: >60 mL/min (ref >60)
Glucose, Bld: 86 mg/dL (ref 70–99)
Potassium: 4 mmol/L (ref 3.5–5.1)
Sodium: 130 mmol/L — ABNORMAL LOW (ref 135–145)

## 2020-09-19 LAB — PATHOLOGIST SMEAR REVIEW

## 2020-09-19 LAB — MAGNESIUM: Magnesium: 1.8 mg/dL (ref 1.7–2.4)

## 2020-09-19 NOTE — Progress Notes (Signed)
Nutrition Follow-up  DOCUMENTATION CODES:   Severe malnutrition in context of chronic illness  INTERVENTION:  Continue Ensure Enlive po BID, each supplement provides 350 kcal and 20 grams of protein  Continue Magic cup TID with meals, each supplement provides 290 kcal and 9 grams of protein   NUTRITION DIAGNOSIS:   Severe Malnutrition related to chronic illness (lymphoproliferative disorder, inadequate oral intake) as evidenced by severe fat depletion,severe muscle depletion. -ongoing  GOAL:   Patient will meet greater than or equal to 90% of their needs -progressing  MONITOR:   PO intake,Supplement acceptance,Labs,Weight trends,I & O's  REASON FOR ASSESSMENT:   Malnutrition Screening Tool    ASSESSMENT:   87 year old male who is HOH with PMHx of GI bleed, GERD, lymphoproliferative disorder, iron deficiency anemia, CAD s/p DES, recent lower GI bleed, recent C difficile infection admitted with severe sepsis due to recurrent C difficile colitis, AKI.  RD working remotely.  Pt with ongoing poor po intake, eating 25-50% of the last 3 documented meals. He has been drinking Ensure supplements and is receiving Magic Cup on meal trays. Oral nutrition supplements provide 1570 kcal, 67 grams protein (meets 100% of min kcal needs, min 89% protein needs).  Weight 58.4 kg today +5.8 kg from 52.6 kg on 3/03; +6.9 L since admit  Medications reviewed and include: Risaquad, Questran, Dificid  IVF: NaCl @ 75 ml/hr  Labs: Na 130 (L), WBC 17.9 (H), Hgb 9.6 (L)  Diet Order:   Diet Order            DIET - DYS 1 Room service appropriate? Yes; Fluid consistency: Thin  Diet effective now                 EDUCATION NEEDS:   No education needs have been identified at this time  Skin:  Skin Assessment: Reviewed RN Assessment  Last BM:  3/10 type 7 small  Height:   Ht Readings from Last 1 Encounters:  09/12/20 5\' 5"  (1.651 m)    Weight:   Wt Readings from Last 1 Encounters:   09/19/20 58.4 kg    BMI:  Body mass index is 21.42 kg/m.  Estimated Nutritional Needs:   Kcal:  1500-1700  Protein:  75-85 grams  Fluid:  1.5-1.7 L/day   Lajuan Lines, RD, LDN Clinical Nutrition After Hours/Weekend Pager # in [place]

## 2020-09-19 NOTE — Plan of Care (Signed)
POC discussed with patient at bedside

## 2020-09-19 NOTE — Progress Notes (Signed)
Physical Therapy Treatment Patient Details Name: Dustin Howell MRN: [id number] DOB: [birth year] Today's Date: 09/19/2020    History of Present Illness Pt is a 87 y.o. male presenting to ED d/t abnormal labs, diarrhea for about 1 month (recent C-diff infection), persistent generalized weakness, and SOB. S/p R IMN intertrochantric 07/21/20.  Pt admitted with severe sepsis d/t recurrent C. difficile colitis, acute respiratory failure with hypoxia, AKI, hyponatremia, hyperkalemia. Also diagnosed with acute PE but after 2 doses of Eliquis pt with acute GI bleed so anti-coagulation stopped; declined IVC filter.  PMH includes h/o GI bleed (Dec 2021), HOH (R sided hearing aid), anemia, lymphoproliferative disorder, CKD, B rotator cuff issues.    PT Comments    Pt in recliner with nurse present upon PT arrival; pt requesting back to bed (pt tired from being up in chair this morning).  Mod assist to stand from recliner and CGA to ambulate 5 feet recliner to bed with RW.  Pt declined further ambulation d/t fatigue but agreeable to ex's.  Performed sitting and semi-supine LE ex's in bed with pacing and rest breaks as needed for fatigue.  Will continue to focus on strengthening and progressive functional mobility per pt tolerance.    Follow Up Recommendations  SNF;Supervision/Assistance - 24 hour     Equipment Recommendations  Rolling walker with 5" wheels;3in1 (PT);Wheelchair (measurements PT);Wheelchair cushion (measurements PT);Hospital bed    Recommendations for Other Services       Precautions / Restrictions Precautions Precautions: Fall Restrictions Weight Bearing Restrictions: No Other Position/Activity Restrictions: Per [facility] note 1/24: WBAT with a walker (s/p R IMN intertrochantric 07/21/20)    Mobility  Bed Mobility Overal bed mobility: Needs Assistance Bed Mobility: Sit to Supine       Sit to supine: Min assist   General bed mobility comments: assist for LE's     Transfers Overall transfer level: Needs assistance Equipment used: Rolling walker (2 wheeled) Transfers: Sit to/from Stand Sit to Stand: Mod assist;+2 safety/equipment         General transfer comment: assist to initiate and come to full stand up to RW  Ambulation/Gait Ambulation/Gait assistance: Min guard Gait Distance (Feet): 5 Feet (recliner to bed) Assistive device: Rolling walker (2 wheeled) Gait Pattern/deviations: Step-through pattern;Trunk flexed;Narrow base of support Gait velocity: decreased   General Gait Details: CGA for safety   Stairs             Wheelchair Mobility    Modified Rankin (Stroke Patients Only)       Balance Overall balance assessment: Needs assistance Sitting-balance support: No upper extremity supported;Feet supported Sitting balance-Leahy Scale: Good Sitting balance - Comments: steady sitting reaching within BOS   Standing balance support: Bilateral upper extremity supported;During functional activity Standing balance-Leahy Scale: Fair Standing balance comment: pt requiring B UE support for static standing balance                            Cognition Arousal/Alertness: Awake/alert Behavior During Therapy: WFL for tasks assessed/performed Overall Cognitive Status: Within Functional Limits for tasks assessed                                        Exercises General Exercises - Lower Extremity Ankle Circles/Pumps: AROM;Strengthening;Both;10 reps;Supine Quad Sets: AROM;Strengthening;Both;10 reps;Supine Short Arc Quad: AROM;Strengthening;Both;10 reps;Supine Long Arc Quad: AROM;Strengthening;Both;10 reps;Seated Heel Slides: AAROM;Strengthening;Both;10 reps;Supine Hip ABduction/ADduction:  AAROM;Strengthening;Both;10 reps;Supine Hip Flexion/Marching: AROM;Both;10 reps;Seated    General Comments   Nursing cleared pt for participation in physical therapy.  Pt agreeable to PT session.  Pt reporting he  was incontinent of stool after LE ex's in bed (smear noted); pt able to perform logrolling in bed modified independent for clean-up.      Pertinent Vitals/Pain Pain Assessment: No/denies pain  Vitals (HR and O2 on room air) stable and WFL throughout treatment session.    Home Living                      Prior Function            PT Goals (current goals can now be found in the care plan section) Acute Rehab PT Goals Patient Stated Goal: to go home PT Goal Formulation: With patient Time For Goal Achievement: 09/30/20 Potential to Achieve Goals: Fair Progress towards PT goals: Progressing toward goals    Frequency    Min 2X/week      PT Plan Current plan remains appropriate    Co-evaluation              AM-PAC PT "6 Clicks" Mobility   Outcome Measure  Help needed turning from your back to your side while in a flat bed without using bedrails?: None Help needed moving from lying on your back to sitting on the side of a flat bed without using bedrails?: A Little Help needed moving to and from a bed to a chair (including a wheelchair)?: A Little Help needed standing up from a chair using your arms (e.g., wheelchair or bedside chair)?: A Lot Help needed to walk in hospital room?: A Little Help needed climbing 3-5 steps with a railing? : A Lot 6 Click Score: 17    End of Session Equipment Utilized During Treatment: Gait belt Activity Tolerance: Patient limited by fatigue Patient left: in bed;with call bell/phone within reach;with bed alarm set;Other (comment) (B heels floating via pillow support) Nurse Communication: Mobility status;Precautions PT Visit Diagnosis: Other abnormalities of gait and mobility (R26.89);Muscle weakness (generalized) (M62.81);Difficulty in walking, not elsewhere classified (R26.2);History of falling (Z91.81)     Time: 7482-7078 PT Time Calculation (min) (ACUTE ONLY): 35 min  Charges:  $Therapeutic Exercise: 8-22  mins $Therapeutic Activity: 8-22 mins                    Leitha Bleak, PT 09/19/20, 1:26 PM

## 2020-09-19 NOTE — Progress Notes (Signed)
PROGRESS NOTE    Dustin Howell  VHQ:[id number] DOB: 01-Jun-1925 DOA: 09/12/2020 PCP: [name], MD  Brief Narrative:  87 y.o. male with medical history significant for history of C. difficile colitis,CAD s/p DES, lymphoproliferative disorder, recent lower GI bleed, recent C. difficile infection, hearing impairment, recent discharge from the hospital on 07/26/2020 after hospitalization for right hip fracture s/p IM nailing on 07/21/2020.  He had recently seen Dr. [name] on 09/10/2020 for recurrent diarrhea and he was restarted on oral vancomycin.  He presented to the hospital with frequent diarrhea.  He was admitted to the hospital for severe sepsis secondary to recurrent C. difficile colitis.  He was treated with fidaxomicin, IV Flagyl and IV fluids. He was also found to have left-sided pulmonary embolism, hyponatremia and AKI complicated by hyperkalemia.  After thorough discussion with patient and family regarding management of pulmonary embolism, they decided to proceed with treatment with Eliquis.  However, patient developed acute GI bleeding after 2 doses of Eliquis.  Aspirin and Eliquis were discontinued.  Patient and her daughter declined IVC filter and they do not want any endoscopic work-up.  Patient is having persistent diarrhea, p.o. Questran added 3/9.  Looks to be effective.  Diarrhea improving   Assessment & Plan:   Principal Problem:   Severe sepsis (HCC) Active Problems:   Acute lower GI bleeding   CAD (coronary artery disease)   Protein-calorie malnutrition, severe   Recurrent colitis due to Clostridium difficile   AKI (acute kidney injury) ([place])   Hyponatremia   Hyperkalemia  Recurrent C. difficile colitis Severe sepsis secondary to above Patient has had persistent diarrhea despite fidaxomicin and Flagyl Sepsis physiology resolving Leukocytosis persistent Plan: Continue vancomycin Continue Flagyl Continue Questran 4 g p.o. twice daily Continue probiotic  tablet daily Gentle IV fluid resuscitation  Acute pulmonary embolism No DVT on bilateral lower extremity duplex Eliquis discontinued because of GI bleeding Family elected to defer endoscopic evaluation Plan: Vitals per unit protocol  Acute GI bleeding/rectal bleeding history of GI bleed  After initiation of Eliquis for GI bleed Patient and daughter defer GI work-up Per daughter patient had a near fatal episode of GI bleeding 5 years ago H&H stable Plan: Defer anticoagulants Daily H&H  Thrombocytopenia No source of bleeding Possibly reactive thrombocytopenia in the setting of infection No indication for transfusion currently   Hyponatremia intravascular volume depletion versus SIADH Mild, asymptomatic Plan: Gentle IV fluids Daily BMP  CAD s/p DES Aspirin discontinued  because of GI bleeding  Severe protein calorie malnutrition [place] Registered dietitian consulted Continue protein enriched feeding supplements  AKI, hyperkalemia and acute hypoxic respiratory failure: Resolved  S/p right hip IM nailing on 07/21/2020.     DVT prophylaxis: SCDs Code Status: DNR Family Communication: Left VM for daughter [name] [phone] on 3/9.  Attempted to call on 3/10.  No answer Disposition Plan: Status is: Inpatient  Remains inpatient appropriate because:Inpatient level of care appropriate due to severity of illness   Dispo: The patient is from: Home              Anticipated d/c is to: Home              Patient currently is not medically stable to d/c.   Difficult to place patient No  Diarrhea improving.  Possible discharge in 24 hours.     Level of care: Med-Surg  Consultants:   None  Procedures:   None  Antimicrobials:   Vancomycin  Metronidazole   Subjective: Patient seen  and examined.  Improvement in diarrhea.  No pain complaints  Objective: Vitals:   09/19/20 0402 09/19/20 0436 09/19/20 0700 09/19/20 1156  BP:    138/73   Pulse: (!) 103  99 100  Resp:   18 16  Temp:   97.6 F (36.4 C) 98.8 F (37.1 C)  TempSrc:   Oral Oral  SpO2: 96%   100%  Weight:  58.4 kg    Height:        Intake/Output Summary (Last 24 hours) at 09/19/2020 1225 Last data filed at 09/19/2020 0300 Gross per 24 hour  Intake 1330.22 ml  Output --  Net 1330.22 ml   Filed Weights   09/17/20 0500 09/18/20 0100 09/19/20 0436  Weight: 56.1 kg 58.9 kg 58.4 kg    Examination:  General exam: Appears calm and comfortable  Respiratory system: Clear to auscultation. Respiratory effort normal. Cardiovascular system: S1 & S2 heard, RRR. No JVD, murmurs, rubs, gallops or clicks. No pedal edema. Gastrointestinal system: Soft, nontender, nondistended, hyperactive bowel sounds Central nervous system: Alert and oriented. No focal neurological deficits. Extremities: Scrotal edema, bilateral lower extremity edema Skin: No rashes, lesions or ulcers Psychiatry: Judgement and insight appear normal. Mood & affect appropriate.     Data Reviewed: I have personally reviewed following labs and imaging studies  CBC: Recent Labs  Lab 09/15/20 0525 09/15/20 1240 09/16/20 0905 09/17/20 0743 09/18/20 0622 09/19/20 0727  WBC 15.3*  --  21.0* 13.6* 22.4* 17.9*  NEUTROABS 5.6  --  6.1 4.8 6.7 8.6*  HGB 10.1* 10.8* 10.7* 9.4* 10.8* 9.6*  HCT 29.7* 32.5* 33.2* 29.4* 34.3* 30.0*  MCV 86.1  --  89.0 89.9 90.7 90.1  PLT 136*  --  132* 112* 122* 161*   Basic Metabolic Panel: Recent Labs  Lab 09/13/20 0525 09/14/20 0535 09/15/20 0525 09/16/20 0905 09/17/20 0743 09/18/20 0622 09/19/20 0727  NA 132* 129* 129* 129* 132* 130* 130*  K 5.0 4.5 4.4 4.6 4.0 4.5 4.0  CL 104 102 103 104 105 104 104  CO2 22 20* 22 19* 22 20* 19*  GLUCOSE 70 75 84 94 73 85 86  BUN 27* 21 24* 21 18 20 21   CREATININE 1.05 1.05 0.87 0.94 0.89 0.95 0.90  CALCIUM 7.0* 7.1* 7.1* 7.5* 7.2* 7.4* 7.0*  MG 2.3 2.1  --  2.0  --  2.0 1.8  PHOS  --  3.3  --  2.7  --  2.8  --     GFR: Estimated Creatinine Clearance: 41.5 mL/min (by C-G formula based on SCr of 0.9 mg/dL). Liver Function Tests: Recent Labs  Lab 09/12/20 1521 09/13/20 0525  AST 37 26  ALT 17 12  ALKPHOS 223* 170*  BILITOT 1.1 1.0  PROT 5.5* 4.2*  ALBUMIN 2.0* 1.6*   No results for input(s): LIPASE, AMYLASE in the last 168 hours. No results for input(s): AMMONIA in the last 168 hours. Coagulation Profile: Recent Labs  Lab 09/12/20 1521  INR 1.0   Cardiac Enzymes: No results for input(s): CKTOTAL, CKMB, CKMBINDEX, TROPONINI in the last 168 hours. BNP (last 3 results) No results for input(s): PROBNP in the last 8760 hours. HbA1C: No results for input(s): HGBA1C in the last 72 hours. CBG: Recent Labs  Lab 09/13/20 0647 09/13/20 0658 09/13/20 0700 09/13/20 0705 09/13/20 0728  GLUCAP 52* 55* 65* 70 74   Lipid Profile: No results for input(s): CHOL, HDL, LDLCALC, TRIG, CHOLHDL, LDLDIRECT in the last 72 hours. Thyroid Function Tests: No results for  input(s): TSH, T4TOTAL, FREET4, T3FREE, THYROIDAB in the last 72 hours. Anemia Panel: No results for input(s): VITAMINB12, FOLATE, FERRITIN, TIBC, IRON, RETICCTPCT in the last 72 hours. Sepsis Labs: Recent Labs  Lab 09/12/20 1521 09/13/20 0525 09/14/20 0535  PROCALCITON  --  1.94  --   LATICACIDVEN 2.8*   4.1*  --  1.2    Recent Results (from the past 240 hour(s))  Culture, blood (Routine x 2)     Status: None   Collection Time: 09/12/20  3:21 PM   Specimen: BLOOD  Result Value Ref Range Status   Specimen Description BLOOD RIGHT ANTECUBITAL  Final   Special Requests   Final    BOTTLES DRAWN AEROBIC AND ANAEROBIC Blood Culture adequate volume   Culture   Final    NO GROWTH 5 DAYS Performed at [facility], [address], [place], [place] [address]    Report Status 09/17/2020 FINAL  Final  Culture, blood (Routine x 2)     Status: None   Collection Time: 09/12/20  3:21 PM   Specimen: BLOOD  Result Value Ref  Range Status   Specimen Description BLOOD LEFT ANTECUBITAL  Final   Special Requests   Final    BOTTLES DRAWN AEROBIC AND ANAEROBIC Blood Culture adequate volume   Culture   Final    NO GROWTH 5 DAYS Performed at [facility], [address] [place], [place] [address]    Report Status 09/17/2020 FINAL  Final  Resp Panel by RT-PCR (Flu A&B, Covid) Nasopharyngeal Swab     Status: None   Collection Time: 09/12/20  3:21 PM   Specimen: Nasopharyngeal Swab; Nasopharyngeal(NP) swabs in vial transport medium  Result Value Ref Range Status   SARS Coronavirus 2 by RT PCR NEGATIVE NEGATIVE Final    Comment: (NOTE) SARS-CoV-2 target nucleic acids are NOT DETECTED.  The SARS-CoV-2 RNA is generally detectable in upper respiratory specimens during the acute phase of infection. The lowest concentration of SARS-CoV-2 viral copies this assay can detect is 138 copies/mL. A negative result does not preclude SARS-Cov-2 infection and should not be used as the sole basis for treatment or other patient management decisions. A negative result may occur with  improper specimen collection/handling, submission of specimen other than nasopharyngeal swab, presence of viral mutation(s) within the areas targeted by this assay, and inadequate number of viral copies(<138 copies/mL). A negative result must be combined with clinical observations, patient history, and epidemiological information. The expected result is Negative.  Fact Sheet for Patients:  EntrepreneurPulse.com.au  Fact Sheet for Healthcare Providers:  IncredibleEmployment.be  This test is no t yet approved or cleared by the [place] FDA and  has been authorized for detection and/or diagnosis of SARS-CoV-2 by FDA under an Emergency Use Authorization (EUA). This EUA will remain  in effect (meaning this test can be used) for the duration of the COVID-19 declaration under Section 564(b)(1) of the  Act, 21 U.S.C.section 360bbb-3(b)(1), unless the authorization is terminated  or revoked sooner.       Influenza A by PCR NEGATIVE NEGATIVE Final   Influenza B by PCR NEGATIVE NEGATIVE Final    Comment: (NOTE) The Xpert Xpress SARS-CoV-2/FLU/RSV plus assay is intended as an aid in the diagnosis of influenza from Nasopharyngeal swab specimens and should not be used as a sole basis for treatment. Nasal washings and aspirates are unacceptable for Xpert Xpress SARS-CoV-2/FLU/RSV testing.  Fact Sheet for Patients: EntrepreneurPulse.com.au  Fact Sheet for Healthcare Providers: IncredibleEmployment.be  This test  is not yet approved or cleared by the [place] and has been authorized for detection and/or diagnosis of SARS-CoV-2 by FDA under an Emergency Use Authorization (EUA). This EUA will remain in effect (meaning this test can be used) for the duration of the COVID-19 declaration under Section 564(b)(1) of the Act, 21 U.S.C. section 360bbb-3(b)(1), unless the authorization is terminated or revoked.  Performed at [facility], [address] [place], [place] [address]   Gastrointestinal Panel by PCR , Stool     Status: None   Collection Time: 09/12/20  3:21 PM   Specimen: STOOL  Result Value Ref Range Status   Campylobacter species NOT DETECTED NOT DETECTED Final   Plesimonas shigelloides NOT DETECTED NOT DETECTED Final   Salmonella species NOT DETECTED NOT DETECTED Final   Yersinia enterocolitica NOT DETECTED NOT DETECTED Final   Vibrio species NOT DETECTED NOT DETECTED Final   Vibrio cholerae NOT DETECTED NOT DETECTED Final   Enteroaggregative E coli (EAEC) NOT DETECTED NOT DETECTED Final   Enteropathogenic E coli (EPEC) NOT DETECTED NOT DETECTED Final   Enterotoxigenic E coli (ETEC) NOT DETECTED NOT DETECTED Final   Shiga like toxin producing E coli (STEC) NOT DETECTED NOT DETECTED Final   Shigella/Enteroinvasive E coli  (EIEC) NOT DETECTED NOT DETECTED Final   Cryptosporidium NOT DETECTED NOT DETECTED Final   Cyclospora cayetanensis NOT DETECTED NOT DETECTED Final   Entamoeba histolytica NOT DETECTED NOT DETECTED Final   Giardia lamblia NOT DETECTED NOT DETECTED Final   Adenovirus F40/41 NOT DETECTED NOT DETECTED Final   Astrovirus NOT DETECTED NOT DETECTED Final   Norovirus GI/GII NOT DETECTED NOT DETECTED Final   Rotavirus A NOT DETECTED NOT DETECTED Final   Sapovirus (I, II, IV, and V) NOT DETECTED NOT DETECTED Final    Comment: Performed at [facility], [address] [place], [place] [address]  C Difficile Quick Screen w PCR reflex     Status: Abnormal   Collection Time: 09/12/20  3:21 PM   Specimen: STOOL  Result Value Ref Range Status   C Diff antigen POSITIVE (A) NEGATIVE Final   C Diff toxin POSITIVE (A) NEGATIVE Final   C Diff interpretation Toxin producing C. difficile detected.  Final    Comment: CRITICAL RESULT CALLED TO, READ BACK BY AND VERIFIED WITH: RAQUEL DAVID @2057  ON 09/12/20 SKL Performed at [facility], [address], [place], [place] [address]          Radiology Studies: No results found.      Scheduled Meds:  acidophilus  1 capsule Oral Daily   atorvastatin  10 mg Oral Daily   cholestyramine  4 g Oral BID   feeding supplement  237 mL Oral BID BM   fidaxomicin  200 mg Oral BID   sodium chloride flush  10-40 mL Intracatheter Q12H   Continuous Infusions:  sodium chloride 75 mL/hr at 09/19/20 0415     LOS: 7 days    Time spent: 15 minutes    Sidney Ace, MD [facility] Pager [phone]  If 7PM-7AM, please contact night-coverage 09/19/2020, 12:25 PM

## 2020-09-19 NOTE — Plan of Care (Signed)
  Problem: Education: Goal: Knowledge of General Education information will improve Description: Including pain rating scale, medication(s)/side effects and non-pharmacologic comfort measures Outcome: Progressing   Problem: Clinical Measurements: Goal: Ability to maintain clinical measurements within normal limits will improve Outcome: Progressing Goal: Will remain free from infection Outcome: Progressing Goal: Diagnostic test results will improve Outcome: Progressing Goal: Respiratory complications will improve Outcome: Progressing Goal: Cardiovascular complication will be avoided Outcome: Progressing   Problem: Activity: Goal: Risk for activity intolerance will decrease Outcome: Progressing   Problem: Nutrition: Goal: Adequate nutrition will be maintained Outcome: Progressing   Problem: Coping: Goal: Level of anxiety will decrease Outcome: Progressing   Problem: Pain Managment: Goal: General experience of comfort will improve Outcome: Progressing   Problem: Safety: Goal: Ability to remain free from injury will improve Outcome: Progressing   Problem: Skin Integrity: Goal: Risk for impaired skin integrity will decrease Outcome: Progressing   Problem: Fluid Volume: Goal: Hemodynamic stability will improve Outcome: Progressing   Problem: Clinical Measurements: Goal: Diagnostic test results will improve Outcome: Progressing Goal: Signs and symptoms of infection will decrease Outcome: Progressing   Problem: Respiratory: Goal: Ability to maintain adequate ventilation will improve Outcome: Progressing

## 2020-09-20 ENCOUNTER — Inpatient Hospital Stay: Payer: Medicare Other

## 2020-09-20 DIAGNOSIS — R6520 Severe sepsis without septic shock: Secondary | ICD-10-CM | POA: Diagnosis not present

## 2020-09-20 DIAGNOSIS — A419 Sepsis, unspecified organism: Secondary | ICD-10-CM | POA: Diagnosis not present

## 2020-09-20 LAB — CBC WITH DIFFERENTIAL/PLATELET
Abs Immature Granulocytes: 1.75 10*3/uL — ABNORMAL HIGH (ref 0.00–0.07)
Basophils Absolute: 0.2 10*3/uL — ABNORMAL HIGH (ref 0.0–0.1)
Basophils Relative: 1 %
Eosinophils Absolute: 0 10*3/uL (ref 0.0–0.5)
Eosinophils Relative: 0 %
HCT: 31.1 % — ABNORMAL LOW (ref 39.0–52.0)
Hemoglobin: 9.3 g/dL — ABNORMAL LOW (ref 13.0–17.0)
Immature Granulocytes: 9 %
Lymphocytes Relative: 27 %
Lymphs Abs: 5.2 10*3/uL — ABNORMAL HIGH (ref 0.7–4.0)
MCH: 28.5 pg (ref 26.0–34.0)
MCHC: 29.9 g/dL — ABNORMAL LOW (ref 30.0–36.0)
MCV: 95.4 fL (ref 80.0–100.0)
Monocytes Absolute: 5.2 10*3/uL — ABNORMAL HIGH (ref 0.1–1.0)
Monocytes Relative: 26 %
Neutro Abs: 7.4 10*3/uL (ref 1.7–7.7)
Neutrophils Relative %: 37 %
Platelets: 131 10*3/uL — ABNORMAL LOW (ref 150–400)
RBC: 3.26 MIL/uL — ABNORMAL LOW (ref 4.22–5.81)
RDW: 19.1 % — ABNORMAL HIGH (ref 11.5–15.5)
Smear Review: NORMAL
WBC: 19.8 10*3/uL — ABNORMAL HIGH (ref 4.0–10.5)
nRBC: 0.3 % — ABNORMAL HIGH (ref 0.0–0.2)

## 2020-09-20 LAB — BASIC METABOLIC PANEL
Anion gap: 5 (ref 5–15)
BUN: 20 mg/dL (ref 8–23)
CO2: 20 mmol/L — ABNORMAL LOW (ref 22–32)
Calcium: 7.1 mg/dL — ABNORMAL LOW (ref 8.9–10.3)
Chloride: 107 mmol/L (ref 98–111)
Creatinine, Ser: 0.86 mg/dL (ref 0.61–1.24)
GFR, Estimated: >60 mL/min (ref >60)
Glucose, Bld: 74 mg/dL (ref 70–99)
Potassium: 4.2 mmol/L (ref 3.5–5.1)
Sodium: 132 mmol/L — ABNORMAL LOW (ref 135–145)

## 2020-09-20 LAB — MAGNESIUM: Magnesium: 1.8 mg/dL (ref 1.7–2.4)

## 2020-09-20 MED ORDER — MAGNESIUM SULFATE 2 GM/50ML IV SOLN
2.0000 g | Freq: Once | INTRAVENOUS | Status: AC
  Administered 2020-09-20: 08:00:00 2 g via INTRAVENOUS
  Filled 2020-09-20: qty 50

## 2020-09-20 NOTE — Care Management (Signed)
Received notification from bedside RN that patient had pulled out his midline and was refusing PIV replacement.  OK to not have IV access at this time.  Patient is receiving oral treatment for C dif colitis, but has refractory diarrhea, poor PO intake. He is not medically stable for discharge at this time, however home with hospice is a reasonable option given advanced age and refractory disease. Will discuss with patient and daughter in AM.  Ralene Muskrat MD

## 2020-09-20 NOTE — Plan of Care (Addendum)
Shift Summary: No acute events overnight. AOx1, remains on RA, VSS. x3 BM overnight, adequate UO. Q2hr turns maintained, rounding performed, fall/safety precautions in place.   Problem: Education: Goal: Knowledge of General Education information will improve Description: Including pain rating scale, medication(s)/side effects and non-pharmacologic comfort measures Outcome: Progressing   Problem: Clinical Measurements: Goal: Ability to maintain clinical measurements within normal limits will improve Outcome: Progressing Goal: Will remain free from infection Outcome: Progressing Goal: Diagnostic test results will improve Outcome: Progressing Goal: Respiratory complications will improve Outcome: Progressing Goal: Cardiovascular complication will be avoided Outcome: Progressing   Problem: Activity: Goal: Risk for activity intolerance will decrease Outcome: Progressing   Problem: Nutrition: Goal: Adequate nutrition will be maintained Outcome: Progressing   Problem: Coping: Goal: Level of anxiety will decrease Outcome: Progressing   Problem: Pain Managment: Goal: General experience of comfort will improve Outcome: Progressing   Problem: Safety: Goal: Ability to remain free from injury will improve Outcome: Progressing   Problem: Skin Integrity: Goal: Risk for impaired skin integrity will decrease Outcome: Progressing   Problem: Fluid Volume: Goal: Hemodynamic stability will improve Outcome: Progressing   Problem: Clinical Measurements: Goal: Diagnostic test results will improve Outcome: Progressing Goal: Signs and symptoms of infection will decrease Outcome: Progressing   Problem: Respiratory: Goal: Ability to maintain adequate ventilation will improve Outcome: Progressing

## 2020-09-20 NOTE — Care Management Important Message (Signed)
Important Message  Patient Details  Name: Dustin Howell MRN: [id number] Date of Birth: May 21, 1925   Medicare Important Message Given:  Other (see comment)  Patient is in an isolation room so tried to reach by phone but no answer. I left a voice message for his daughter, [name] [phone] as that is who the RN CM has been discussing his care with.  I will await a return call.    [name] A Kaloni Bisaillon 09/20/2020, 2:35 PM

## 2020-09-20 NOTE — Progress Notes (Signed)
IVT consulted for PIV placement.  Per RN, pt pulled midline.  Pt refused PIV assessment and placement at this time.  Pt A&O.  RN called to bedside and spoke with pt whom made RN aware of current refusal.  RN advised to pass along in report and put consult back in if needed.

## 2020-09-20 NOTE — Progress Notes (Signed)
PT Cancellation Note  Patient Details Name: [name] MRN: [id number] DOB: Feb 15, 1925   Cancelled Treatment:    Reason Eval/Treat Not Completed: Fatigue/lethargy limiting ability to participate. Despite encouragement, patient continues to refuse session. Will re-attempt at later day/time.    [name] 09/20/2020, 3:09 PM

## 2020-09-20 NOTE — TOC Progression Note (Signed)
Transition of Care [facility]) - Progression Note    Patient Details  Name: Dustin Howell MRN: [id number] Date of Birth: 06/21/1925  [facility] CM/SW Contact  Shelbie Hutching, RN Phone Number: 09/20/2020, 4:28 PM  Clinical Narrative:    Patient wants to go home but patient is not medically stable at this time.  Patient will go home with daughter and home health services when medically ready.     Expected Discharge Plan: [place] Barriers to Discharge: Continued Medical Work up  Expected Discharge Plan and Services Expected Discharge Plan: [place] In-house Referral: Hospice / Palliative Care Discharge Planning Services: CM Consult Post Acute Care Choice: [place] arrangements for the past 2 months: Single Family Home                 DME Arranged: N/A DME Agency: NA       HH Arranged: RN,PT,OT [place] Agency: [facility] [place] Date [place]: 09/18/20 Time [place]: 1632 Representative spoke with at [place]: [place] ([place]) Interventions    Readmission Risk Interventions No flowsheet data found.

## 2020-09-20 NOTE — Progress Notes (Signed)
PROGRESS NOTE    Dustin Howell  TIR:[id number] DOB: 12-15-1924 DOA: 09/12/2020 PCP: [name], MD  Brief Narrative:  87 y.o. male with medical history significant for history of C. difficile colitis,CAD s/p DES, lymphoproliferative disorder, recent lower GI bleed, recent C. difficile infection, hearing impairment, recent discharge from the hospital on 07/26/2020 after hospitalization for right hip fracture s/p IM nailing on 07/21/2020.  He had recently seen Dr. [name] on 09/10/2020 for recurrent diarrhea and he was restarted on oral vancomycin.  He presented to the hospital with frequent diarrhea.  He was admitted to the hospital for severe sepsis secondary to recurrent C. difficile colitis.  He was treated with fidaxomicin, IV Flagyl and IV fluids. He was also found to have left-sided pulmonary embolism, hyponatremia and AKI complicated by hyperkalemia.  After thorough discussion with patient and family regarding management of pulmonary embolism, they decided to proceed with treatment with Eliquis.  However, patient developed acute GI bleeding after 2 doses of Eliquis.  Aspirin and Eliquis were discontinued.  Patient and her daughter declined IVC filter and they do not want any endoscopic work-up.  Patient is having persistent diarrhea, p.o. Questran added 3/9.  When Questran is administered at staggered dosing intervals with Dificid it seems to be effective.  Pharmacy is aware and will create staggered dosing schedule  Assessment & Plan:   Principal Problem:   Severe sepsis ([place]) Active Problems:   Acute lower GI bleeding   CAD (coronary artery disease)   Protein-calorie malnutrition, severe   Recurrent colitis due to Clostridium difficile   AKI (acute kidney injury) ([place])   Hyponatremia   Hyperkalemia  Recurrent C. difficile colitis Severe sepsis secondary to above Patient has had persistent diarrhea despite fidaxomicin and Flagyl Sepsis physiology resolving Leukocytosis  persistent Diarrhea continues Plan: Continue Dificid 200 mg twice daily Continue Questran 4 g p.o. twice daily Ensure that medications are not given at the same time Continue probiotic tablet daily Gentle IV fluid resuscitation  Acute pulmonary embolism No DVT on bilateral lower extremity duplex Eliquis discontinued because of GI bleeding Family elected to defer endoscopic evaluation Plan: Vitals per unit protocol  Acute GI bleeding/rectal bleeding history of GI bleed  After initiation of Eliquis for GI bleed Patient and daughter defer GI work-up Per daughter patient had a near fatal episode of GI bleeding 5 years ago H&H stable Plan: Defer anticoagulants Daily H&H  Thrombocytopenia No source of bleeding Possibly reactive thrombocytopenia in the setting of infection No indication for transfusion currently   Hyponatremia intravascular volume depletion versus SIADH Mild, asymptomatic Plan: Gentle IV fluids Daily BMP  CAD s/p DES Aspirin discontinued  because of GI bleeding  Severe protein calorie malnutrition [place] Registered dietitian consulted Continue protein enriched feeding supplements  AKI, hyperkalemia and acute hypoxic respiratory failure: Resolved  S/p right hip IM nailing on 07/21/2020.     DVT prophylaxis: SCDs Code Status: DNR Family Communication: Daughter [name] [phone] on 3/11 disposition Plan: Status is: Inpatient  Remains inpatient appropriate because:Inpatient level of care appropriate due to severity of illness   Dispo: The patient is from: Home              Anticipated d/c is to: Home              Patient currently is not medically stable to d/c.   Difficult to place patient No  Diarrhea persistent.  Disposition plan pending.     Level of care: Med-Surg  Consultants:   None  Procedures:   None  Antimicrobials:   Dificid   Subjective: Patient seen and examined.  Reports continued diarrhea today.   No pain complaints  Objective: Vitals:   09/19/20 2312 09/20/20 0154 09/20/20 0457 09/20/20 0813  BP: 121/67  107/63 119/72  Pulse: (!) 102  96 (!) 101  Resp: 16  16 15   Temp: 98.2 F (36.8 C)  97.8 F (36.6 C) 98.2 F (36.8 C)  TempSrc: Oral  Oral   SpO2: 92%  91% 93%  Weight:  58 kg    Height:        Intake/Output Summary (Last 24 hours) at 09/20/2020 1108 Last data filed at 09/20/2020 0500 Gross per 24 hour  Intake 2439.07 ml  Output 150 ml  Net 2289.07 ml   Filed Weights   09/18/20 0100 09/19/20 0436 09/20/20 0154  Weight: 58.9 kg 58.4 kg 58 kg    Examination:  General exam: Appears calm and comfortable  Respiratory system: Clear to auscultation. Respiratory effort normal. Cardiovascular system: S1 & S2 heard, RRR. No JVD, murmurs, rubs, gallops or clicks. No pedal edema. Gastrointestinal system: Soft, nontender, nondistended, hyperactive bowel sounds Central nervous system: Alert and oriented. No focal neurological deficits. Extremities: Scrotal edema, bilateral lower extremity edema Skin: No rashes, lesions or ulcers Psychiatry: Judgement and insight appear normal. Mood & affect appropriate.     Data Reviewed: I have personally reviewed following labs and imaging studies  CBC: Recent Labs  Lab 09/16/20 0905 09/17/20 0743 09/18/20 0622 09/19/20 0727 09/20/20 0426  WBC 21.0* 13.6* 22.4* 17.9* 19.8*  NEUTROABS 6.1 4.8 6.7 8.6* 7.4  HGB 10.7* 9.4* 10.8* 9.6* 9.3*  HCT 33.2* 29.4* 34.3* 30.0* 31.1*  MCV 89.0 89.9 90.7 90.1 95.4  PLT 132* 112* 122* 125* 470*   Basic Metabolic Panel: Recent Labs  Lab 09/14/20 0535 09/15/20 0525 09/16/20 0905 09/17/20 0743 09/18/20 0622 09/19/20 0727 09/20/20 0426  NA 129*   < > 129* 132* 130* 130* 132*  K 4.5   < > 4.6 4.0 4.5 4.0 4.2  CL 102   < > 104 105 104 104 107  CO2 20*   < > 19* 22 20* 19* 20*  GLUCOSE 75   < > 94 73 85 86 74  BUN 21   < > 21 18 20 21 20   CREATININE 1.05   < > 0.94 0.89 0.95 0.90 0.86   CALCIUM 7.1*   < > 7.5* 7.2* 7.4* 7.0* 7.1*  MG 2.1  --  2.0  --  2.0 1.8 1.8  PHOS 3.3  --  2.7  --  2.8  --   --    < > = values in this interval not displayed.   GFR: Estimated Creatinine Clearance: 43.1 mL/min (by C-G formula based on SCr of 0.86 mg/dL). Liver Function Tests: No results for input(s): AST, ALT, ALKPHOS, BILITOT, PROT, ALBUMIN in the last 168 hours. No results for input(s): LIPASE, AMYLASE in the last 168 hours. No results for input(s): AMMONIA in the last 168 hours. Coagulation Profile: No results for input(s): INR, PROTIME in the last 168 hours. Cardiac Enzymes: No results for input(s): CKTOTAL, CKMB, CKMBINDEX, TROPONINI in the last 168 hours. BNP (last 3 results) No results for input(s): PROBNP in the last 8760 hours. HbA1C: No results for input(s): HGBA1C in the last 72 hours. CBG: No results for input(s): GLUCAP in the last 168 hours. Lipid Profile: No results for input(s): CHOL, HDL, LDLCALC, TRIG, CHOLHDL, LDLDIRECT in the last 72  hours. Thyroid Function Tests: No results for input(s): TSH, T4TOTAL, FREET4, T3FREE, THYROIDAB in the last 72 hours. Anemia Panel: No results for input(s): VITAMINB12, FOLATE, FERRITIN, TIBC, IRON, RETICCTPCT in the last 72 hours. Sepsis Labs: Recent Labs  Lab 09/14/20 0535  LATICACIDVEN 1.2    Recent Results (from the past 240 hour(s))  Culture, blood (Routine x 2)     Status: None   Collection Time: 09/12/20  3:21 PM   Specimen: BLOOD  Result Value Ref Range Status   Specimen Description BLOOD RIGHT ANTECUBITAL  Final   Special Requests   Final    BOTTLES DRAWN AEROBIC AND ANAEROBIC Blood Culture adequate volume   Culture   Final    NO GROWTH 5 DAYS Performed at [facility], [address] [place], [place] [address]    Report Status 09/17/2020 FINAL  Final  Culture, blood (Routine x 2)     Status: None   Collection Time: 09/12/20  3:21 PM   Specimen: BLOOD  Result Value Ref Range Status    Specimen Description BLOOD LEFT ANTECUBITAL  Final   Special Requests   Final    BOTTLES DRAWN AEROBIC AND ANAEROBIC Blood Culture adequate volume   Culture   Final    NO GROWTH 5 DAYS Performed at [facility], [address] [place], [place] [address]    Report Status 09/17/2020 FINAL  Final  Resp Panel by RT-PCR (Flu A&B, Covid) Nasopharyngeal Swab     Status: None   Collection Time: 09/12/20  3:21 PM   Specimen: Nasopharyngeal Swab; Nasopharyngeal(NP) swabs in vial transport medium  Result Value Ref Range Status   SARS Coronavirus 2 by RT PCR NEGATIVE NEGATIVE Final    Comment: (NOTE) SARS-CoV-2 target nucleic acids are NOT DETECTED.  The SARS-CoV-2 RNA is generally detectable in upper respiratory specimens during the acute phase of infection. The lowest concentration of SARS-CoV-2 viral copies this assay can detect is 138 copies/mL. A negative result does not preclude SARS-Cov-2 infection and should not be used as the sole basis for treatment or other patient management decisions. A negative result may occur with  improper specimen collection/handling, submission of specimen other than nasopharyngeal swab, presence of viral mutation(s) within the areas targeted by this assay, and inadequate number of viral copies(<138 copies/mL). A negative result must be combined with clinical observations, patient history, and epidemiological information. The expected result is Negative.  Fact Sheet for Patients:  EntrepreneurPulse.com.au  Fact Sheet for Healthcare Providers:  IncredibleEmployment.be  This test is no t yet approved or cleared by the [place] FDA and  has been authorized for detection and/or diagnosis of SARS-CoV-2 by FDA under an Emergency Use Authorization (EUA). This EUA will remain  in effect (meaning this test can be used) for the duration of the COVID-19 declaration under Section 564(b)(1) of the Act,  21 U.S.C.section 360bbb-3(b)(1), unless the authorization is terminated  or revoked sooner.       Influenza A by PCR NEGATIVE NEGATIVE Final   Influenza B by PCR NEGATIVE NEGATIVE Final    Comment: (NOTE) The Xpert Xpress SARS-CoV-2/FLU/RSV plus assay is intended as an aid in the diagnosis of influenza from Nasopharyngeal swab specimens and should not be used as a sole basis for treatment. Nasal washings and aspirates are unacceptable for Xpert Xpress SARS-CoV-2/FLU/RSV testing.  Fact Sheet for Patients: EntrepreneurPulse.com.au  Fact Sheet for Healthcare Providers: IncredibleEmployment.be  This test is not yet approved or cleared by the [place] FDA and has  been authorized for detection and/or diagnosis of SARS-CoV-2 by FDA under an Emergency Use Authorization (EUA). This EUA will remain in effect (meaning this test can be used) for the duration of the COVID-19 declaration under Section 564(b)(1) of the Act, 21 U.S.C. section 360bbb-3(b)(1), unless the authorization is terminated or revoked.  Performed at [facility], [address] [place], [place] [address]   Gastrointestinal Panel by PCR , Stool     Status: None   Collection Time: 09/12/20  3:21 PM   Specimen: STOOL  Result Value Ref Range Status   Campylobacter species NOT DETECTED NOT DETECTED Final   Plesimonas shigelloides NOT DETECTED NOT DETECTED Final   Salmonella species NOT DETECTED NOT DETECTED Final   Yersinia enterocolitica NOT DETECTED NOT DETECTED Final   Vibrio species NOT DETECTED NOT DETECTED Final   Vibrio cholerae NOT DETECTED NOT DETECTED Final   Enteroaggregative E coli (EAEC) NOT DETECTED NOT DETECTED Final   Enteropathogenic E coli (EPEC) NOT DETECTED NOT DETECTED Final   Enterotoxigenic E coli (ETEC) NOT DETECTED NOT DETECTED Final   Shiga like toxin producing E coli (STEC) NOT DETECTED NOT DETECTED Final   Shigella/Enteroinvasive E coli  (EIEC) NOT DETECTED NOT DETECTED Final   Cryptosporidium NOT DETECTED NOT DETECTED Final   Cyclospora cayetanensis NOT DETECTED NOT DETECTED Final   Entamoeba histolytica NOT DETECTED NOT DETECTED Final   Giardia lamblia NOT DETECTED NOT DETECTED Final   Adenovirus F40/41 NOT DETECTED NOT DETECTED Final   Astrovirus NOT DETECTED NOT DETECTED Final   Norovirus GI/GII NOT DETECTED NOT DETECTED Final   Rotavirus A NOT DETECTED NOT DETECTED Final   Sapovirus (I, II, IV, and V) NOT DETECTED NOT DETECTED Final    Comment: Performed at [facility], [address] [place], [place] [address]  C Difficile Quick Screen w PCR reflex     Status: Abnormal   Collection Time: 09/12/20  3:21 PM   Specimen: STOOL  Result Value Ref Range Status   C Diff antigen POSITIVE (A) NEGATIVE Final   C Diff toxin POSITIVE (A) NEGATIVE Final   C Diff interpretation Toxin producing C. difficile detected.  Final    Comment: CRITICAL RESULT CALLED TO, READ BACK BY AND VERIFIED WITH: RAQUEL DAVID @2057  ON 09/12/20 SKL Performed at [facility], [address] [place], [place] [address]          Radiology Studies: No results found.      Scheduled Meds: . acidophilus  1 capsule Oral Daily  . atorvastatin  10 mg Oral Daily  . cholestyramine  4 g Oral BID  . feeding supplement  237 mL Oral BID BM  . fidaxomicin  200 mg Oral BID  . sodium chloride flush  10-40 mL Intracatheter Q12H   Continuous Infusions: . sodium chloride 75 mL/hr at 09/19/20 1516     LOS: 8 days    Time spent: 15 minutes    Sidney Ace, MD [facility] Pager [phone]  If 7PM-7AM, please contact night-coverage 09/20/2020, 11:08 AM

## 2020-09-21 DIAGNOSIS — A419 Sepsis, unspecified organism: Secondary | ICD-10-CM | POA: Diagnosis not present

## 2020-09-21 DIAGNOSIS — R6520 Severe sepsis without septic shock: Secondary | ICD-10-CM | POA: Diagnosis not present

## 2020-09-21 LAB — CBC WITH DIFFERENTIAL/PLATELET
Abs Immature Granulocytes: 1.36 10*3/uL — ABNORMAL HIGH (ref 0.00–0.07)
Basophils Absolute: 0.2 10*3/uL — ABNORMAL HIGH (ref 0.0–0.1)
Basophils Relative: 1 %
Eosinophils Absolute: 0 10*3/uL (ref 0.0–0.5)
Eosinophils Relative: 0 %
HCT: 30 % — ABNORMAL LOW (ref 39.0–52.0)
Hemoglobin: 9.3 g/dL — ABNORMAL LOW (ref 13.0–17.0)
Immature Granulocytes: 6 %
Lymphocytes Relative: 25 %
Lymphs Abs: 5.6 10*3/uL — ABNORMAL HIGH (ref 0.7–4.0)
MCH: 28.4 pg (ref 26.0–34.0)
MCHC: 31 g/dL (ref 30.0–36.0)
MCV: 91.7 fL (ref 80.0–100.0)
Monocytes Absolute: 6.3 10*3/uL — ABNORMAL HIGH (ref 0.1–1.0)
Monocytes Relative: 28 %
Neutro Abs: 8.8 10*3/uL — ABNORMAL HIGH (ref 1.7–7.7)
Neutrophils Relative %: 40 %
Platelets: 150 10*3/uL (ref 150–400)
RBC: 3.27 MIL/uL — ABNORMAL LOW (ref 4.22–5.81)
RDW: 19.2 % — ABNORMAL HIGH (ref 11.5–15.5)
Smear Review: NORMAL
WBC: 22.2 10*3/uL — ABNORMAL HIGH (ref 4.0–10.5)
nRBC: 0.4 % — ABNORMAL HIGH (ref 0.0–0.2)

## 2020-09-21 LAB — BASIC METABOLIC PANEL
Anion gap: 7 (ref 5–15)
BUN: 20 mg/dL (ref 8–23)
CO2: 18 mmol/L — ABNORMAL LOW (ref 22–32)
Calcium: 7.4 mg/dL — ABNORMAL LOW (ref 8.9–10.3)
Chloride: 109 mmol/L (ref 98–111)
Creatinine, Ser: 0.86 mg/dL (ref 0.61–1.24)
GFR, Estimated: >60 mL/min (ref >60)
Glucose, Bld: 106 mg/dL — ABNORMAL HIGH (ref 70–99)
Potassium: 4.3 mmol/L (ref 3.5–5.1)
Sodium: 134 mmol/L — ABNORMAL LOW (ref 135–145)

## 2020-09-21 LAB — URINALYSIS, COMPLETE (UACMP) WITH MICROSCOPIC
Bacteria, UA: NONE SEEN
Bilirubin Urine: NEGATIVE
Glucose, UA: NEGATIVE mg/dL
Hgb urine dipstick: NEGATIVE
Ketones, ur: NEGATIVE mg/dL
Leukocytes,Ua: NEGATIVE
Nitrite: NEGATIVE
Protein, ur: NEGATIVE mg/dL
Specific Gravity, Urine: 1.019 (ref 1.005–1.030)
Squamous Epithelial / HPF: NONE SEEN (ref 0–5)
pH: 5 (ref 5.0–8.0)

## 2020-09-21 LAB — MAGNESIUM: Magnesium: 1.9 mg/dL (ref 1.7–2.4)

## 2020-09-21 MED ORDER — METRONIDAZOLE 500 MG PO TABS
500.0000 mg | ORAL_TABLET | Freq: Three times a day (TID) | ORAL | Status: DC
  Administered 2020-09-21 – 2020-09-23 (×6): 500 mg via ORAL
  Filled 2020-09-21 (×6): qty 1

## 2020-09-21 MED ORDER — VANCOMYCIN 50 MG/ML ORAL SOLUTION
125.0000 mg | Freq: Four times a day (QID) | ORAL | Status: DC
  Administered 2020-09-21 – 2020-09-23 (×8): 125 mg via ORAL
  Filled 2020-09-21 (×13): qty 2.5

## 2020-09-21 NOTE — Plan of Care (Signed)
Shift Summary: Pt Aox1-2 overnight, remains on RA, VSS. Multiple loose bowel movements overnight. No UO until early AM, UA collected and sent to lab. Q2hr turns maintained, fall/safety precautions in place, rounding performed. Pt anxious to go home, called daughter overnight to come pick him up; reoriented to hospital and situation.    Problem: Education: Goal: Knowledge of General Education information will improve Description: Including pain rating scale, medication(s)/side effects and non-pharmacologic comfort measures 09/21/2020 0609 by Jocelyn Lamer, RN Outcome: Progressing 09/21/2020 0449 by Jocelyn Lamer, RN Outcome: Progressing   Problem: Clinical Measurements: Goal: Ability to maintain clinical measurements within normal limits will improve 09/21/2020 0609 by Jocelyn Lamer, RN Outcome: Progressing 09/21/2020 0449 by Jocelyn Lamer, RN Outcome: Progressing Goal: Will remain free from infection 09/21/2020 0609 by Jocelyn Lamer, RN Outcome: Progressing 09/21/2020 0449 by Jocelyn Lamer, RN Outcome: Progressing Goal: Diagnostic test results will improve 09/21/2020 0609 by Jocelyn Lamer, RN Outcome: Progressing 09/21/2020 0449 by Jocelyn Lamer, RN Outcome: Progressing Goal: Respiratory complications will improve 09/21/2020 0609 by Jocelyn Lamer, RN Outcome: Progressing 09/21/2020 0449 by Jocelyn Lamer, RN Outcome: Progressing Goal: Cardiovascular complication will be avoided 09/21/2020 0609 by Jocelyn Lamer, RN Outcome: Progressing 09/21/2020 0449 by Jocelyn Lamer, RN Outcome: Progressing   Problem: Activity: Goal: Risk for activity intolerance will decrease 09/21/2020 0609 by Jocelyn Lamer, RN Outcome: Progressing 09/21/2020 0449 by Jocelyn Lamer, RN Outcome: Progressing   Problem: Nutrition: Goal: Adequate nutrition will be maintained 09/21/2020 0609 by Jocelyn Lamer, RN Outcome: Progressing 09/21/2020 0449 by Jocelyn Lamer, RN Outcome:  Progressing   Problem: Coping: Goal: Level of anxiety will decrease 09/21/2020 0609 by Jocelyn Lamer, RN Outcome: Progressing 09/21/2020 0449 by Jocelyn Lamer, RN Outcome: Progressing   Problem: Pain Managment: Goal: General experience of comfort will improve 09/21/2020 0609 by Jocelyn Lamer, RN Outcome: Progressing 09/21/2020 0449 by Jocelyn Lamer, RN Outcome: Progressing   Problem: Safety: Goal: Ability to remain free from injury will improve 09/21/2020 0609 by Jocelyn Lamer, RN Outcome: Progressing 09/21/2020 0449 by Jocelyn Lamer, RN Outcome: Progressing   Problem: Skin Integrity: Goal: Risk for impaired skin integrity will decrease 09/21/2020 0609 by Jocelyn Lamer, RN Outcome: Progressing 09/21/2020 0449 by Jocelyn Lamer, RN Outcome: Progressing   Problem: Fluid Volume: Goal: Hemodynamic stability will improve 09/21/2020 0609 by Jocelyn Lamer, RN Outcome: Progressing 09/21/2020 0449 by Jocelyn Lamer, RN Outcome: Progressing   Problem: Clinical Measurements: Goal: Diagnostic test results will improve 09/21/2020 0609 by Jocelyn Lamer, RN Outcome: Progressing 09/21/2020 0449 by Jocelyn Lamer, RN Outcome: Progressing Goal: Signs and symptoms of infection will decrease 09/21/2020 0609 by Jocelyn Lamer, RN Outcome: Progressing 09/21/2020 0449 by Jocelyn Lamer, RN Outcome: Progressing   Problem: Respiratory: Goal: Ability to maintain adequate ventilation will improve 09/21/2020 0609 by Jocelyn Lamer, RN Outcome: Progressing 09/21/2020 0449 by Jocelyn Lamer, RN Outcome: Progressing

## 2020-09-21 NOTE — Progress Notes (Signed)
PROGRESS NOTE    Dustin Howell  NGE:[id number] DOB: 03/12/1925 DOA: 09/12/2020 PCP: [name], MD  Brief Narrative:  87 y.o. male with medical history significant for history of C. difficile colitis,CAD s/p DES, lymphoproliferative disorder, recent lower GI bleed, recent C. difficile infection, hearing impairment, recent discharge from the hospital on 07/26/2020 after hospitalization for right hip fracture s/p IM nailing on 07/21/2020.  He had recently seen Dr. [name] on 09/10/2020 for recurrent diarrhea and he was restarted on oral vancomycin.  He presented to the hospital with frequent diarrhea.  He was admitted to the hospital for severe sepsis secondary to recurrent C. difficile colitis.  He was treated with fidaxomicin, IV Flagyl and IV fluids. He was also found to have left-sided pulmonary embolism, hyponatremia and AKI complicated by hyperkalemia.  After thorough discussion with patient and family regarding management of pulmonary embolism, they decided to proceed with treatment with Eliquis.  However, patient developed acute GI bleeding after 2 doses of Eliquis.  Aspirin and Eliquis were discontinued.  Patient and her daughter declined IVC filter and they do not want any endoscopic work-up.  Patient is having persistent diarrhea, p.o. Questran added 3/9.  When Questran is administered at staggered dosing intervals with Dificid it seems to be effective.  Pharmacy is aware and will create staggered dosing schedule.  Conversation with the patient and the daughter on 3/12.  The patient has persistent diarrhea despite aggressive therapy.  Unfortunately we are rapidly running out of options.  Patient is agreeable to stay over the weekend to see if we can get better hold of his diarrhea otherwise we may need to consider referral to hospice care.  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 difficile colitis Severe sepsis secondary to above Patient has had persistent diarrhea despite fidaxomicin and Flagyl Sepsis physiology resolving Leukocytosis persistent and worsening Diarrhea continues despite aggressive therapy Plan: We will continue Dificid and Questran.  Add oral vancomycin 125 mg 4 times daily.  Ensure medications are not given concomitantly with Questran.  Continue daily probiotic.  Patient is becoming increasingly frustrated and wishes to go home.  If this addition of vancomycin does not work we may need to consider discharge home with hospice services.  Acute pulmonary embolism No DVT on bilateral lower extremity duplex Eliquis discontinued because of GI bleeding Family elected to defer endoscopic evaluation Plan: Vitals per unit protocol  Acute GI bleeding/rectal bleeding history of GI bleed  After initiation of Eliquis for GI bleed Patient and daughter defer GI work-up Per daughter patient had a near fatal episode of GI bleeding 5 years ago H&H stable Plan: Defer anticoagulants Daily H&H  Thrombocytopenia No source of bleeding Possibly reactive thrombocytopenia in the setting of infection No indication for transfusion currently   Hyponatremia intravascular volume depletion versus SIADH Mild, asymptomatic Plan: Gentle IV fluids Daily BMP  CAD s/p DES Aspirin discontinued  because of GI bleeding  Severe protein calorie malnutrition Anasarca Registered dietitian consulted Continue protein enriched feeding supplements  AKI, hyperkalemia and acute hypoxic respiratory failure: Resolved  S/p right hip IM nailing on 07/21/2020.     DVT prophylaxis: SCDs Code Status: DNR Family Communication: Daughter [name] [phone] on 3/12 disposition Plan: Status  is:  Inpatient  Remains inpatient appropriate because:Inpatient level of care appropriate due to severity of illness   Dispo: The patient is from: Home              Anticipated d/c is to: Home              Patient currently is not medically stable to d/c.   Difficult to place patient No  Diarrhea persistent.  We will add oral vancomycin and monitor over weekend.  If patient continues to clinically deteriorate we may need to consider referral to hospice.     Level of care: Med-Surg  Consultants:   None  Procedures:   None  Antimicrobials:   Dificid   Subjective: Patient seen and examined.  Reports continued diarrhea today.  Some abdominal pain endorsed Objective: Vitals:   09/20/20 2036 09/21/20 0014 09/21/20 0423 09/21/20 1109  BP: 123/74 128/72 129/68 118/65  Pulse: 96 88 88 (!) 109  Resp: 16 16 18 16   Temp: 98.3 F (36.8 C) 98.3 F (36.8 C) 98 F (36.7 C) 97.9 F (36.6 C)  TempSrc: Oral Oral Oral   SpO2: 94% 97% 97% 92%  Weight:  57 kg    Height:        Intake/Output Summary (Last 24 hours) at 09/21/2020 1237 Last data filed at 09/21/2020 0602 Gross per 24 hour  Intake 759.54 ml  Output 375 ml  Net 384.54 ml   Filed Weights   09/19/20 0436 09/20/20 0154 09/21/20 0014  Weight: 58.4 kg 58 kg 57 kg    Examination:  General exam: Appears calm and comfortable  Respiratory system: Clear to auscultation. Respiratory effort normal. Cardiovascular system: S1 & S2 heard, RRR. No JVD, murmurs, rubs, gallops or clicks. No pedal edema. Gastrointestinal system: Soft, nontender, nondistended, hyperactive bowel sounds Central nervous system: Alert and oriented. No focal neurological deficits. Extremities: Scrotal edema, bilateral lower extremity edema Skin: No rashes, lesions or ulcers Psychiatry: Judgement and insight appear normal. Mood & affect appropriate.     Data Reviewed: I have personally reviewed following labs and imaging studies  CBC: Recent Labs   Lab 09/17/20 0743 09/18/20 0622 09/19/20 0727 09/20/20 0426 09/21/20 0420  WBC 13.6* 22.4* 17.9* 19.8* 22.2*  NEUTROABS 4.8 6.7 8.6* 7.4 8.8*  HGB 9.4* 10.8* 9.6* 9.3* 9.3*  HCT 29.4* 34.3* 30.0* 31.1* 30.0*  MCV 89.9 90.7 90.1 95.4 91.7  PLT 112* 122* 125* 131* 812   Basic Metabolic Panel: Recent Labs  Lab 09/16/20 0905 09/17/20 0743 09/18/20 0622 09/19/20 0727 09/20/20 0426 09/21/20 0420  NA 129* 132* 130* 130* 132* 134*  K 4.6 4.0 4.5 4.0 4.2 4.3  CL 104 105 104 104 107 109  CO2 19* 22 20* 19* 20* 18*  GLUCOSE 94 73 85 86 74 106*  BUN 21 18 20 21 20 20   CREATININE 0.94 0.89 0.95 0.90 0.86 0.86  CALCIUM 7.5* 7.2* 7.4* 7.0* 7.1* 7.4*  MG 2.0  --  2.0 1.8 1.8 1.9  PHOS 2.7  --  2.8  --   --   --    GFR: Estimated Creatinine Clearance: 42.3 mL/min (by C-G formula based on SCr of 0.86 mg/dL). Liver Function Tests: No results for input(s): AST, ALT, ALKPHOS, BILITOT, PROT, ALBUMIN in the last 168 hours. No results for input(s): LIPASE, AMYLASE in the last 168 hours. No results for input(s): AMMONIA in the last 168 hours. Coagulation Profile: No results for input(s): INR, PROTIME in the last 168 hours. Cardiac Enzymes:  No results for input(s): CKTOTAL, CKMB, CKMBINDEX, TROPONINI in the last 168 hours. BNP (last 3 results) No results for input(s): PROBNP in the last 8760 hours. HbA1C: No results for input(s): HGBA1C in the last 72 hours. CBG: No results for input(s): GLUCAP in the last 168 hours. Lipid Profile: No results for input(s): CHOL, HDL, LDLCALC, TRIG, CHOLHDL, LDLDIRECT in the last 72 hours. Thyroid Function Tests: No results for input(s): TSH, T4TOTAL, FREET4, T3FREE, THYROIDAB in the last 72 hours. Anemia Panel: No results for input(s): VITAMINB12, FOLATE, FERRITIN, TIBC, IRON, RETICCTPCT in the last 72 hours. Sepsis Labs: No results for input(s): PROCALCITON, LATICACIDVEN in the last 168 hours.  Recent Results (from the past 240 hour(s))  Culture,  blood (Routine x 2)     Status: None   Collection Time: 09/12/20  3:21 PM   Specimen: BLOOD  Result Value Ref Range Status   Specimen Description BLOOD RIGHT ANTECUBITAL  Final   Special Requests   Final    BOTTLES DRAWN AEROBIC AND ANAEROBIC Blood Culture adequate volume   Culture   Final    NO GROWTH 5 DAYS Performed at [facility], [address] [place], [place] [address]    Report Status 09/17/2020 FINAL  Final  Culture, blood (Routine x 2)     Status: None   Collection Time: 09/12/20  3:21 PM   Specimen: BLOOD  Result Value Ref Range Status   Specimen Description BLOOD LEFT ANTECUBITAL  Final   Special Requests   Final    BOTTLES DRAWN AEROBIC AND ANAEROBIC Blood Culture adequate volume   Culture   Final    NO GROWTH 5 DAYS Performed at [facility], [address] [place], [place] [address]    Report Status 09/17/2020 FINAL  Final  Resp Panel by RT-PCR (Flu A&B, Covid) Nasopharyngeal Swab     Status: None   Collection Time: 09/12/20  3:21 PM   Specimen: Nasopharyngeal Swab; Nasopharyngeal(NP) swabs in vial transport medium  Result Value Ref Range Status   SARS Coronavirus 2 by RT PCR NEGATIVE NEGATIVE Final    Comment: (NOTE) SARS-CoV-2 target nucleic acids are NOT DETECTED.  The SARS-CoV-2 RNA is generally detectable in upper respiratory specimens during the acute phase of infection. The lowest concentration of SARS-CoV-2 viral copies this assay can detect is 138 copies/mL. A negative result does not preclude SARS-Cov-2 infection and should not be used as the sole basis for treatment or other patient management decisions. A negative result may occur with  improper specimen collection/handling, submission of specimen other than nasopharyngeal swab, presence of viral mutation(s) within the areas targeted by this assay, and inadequate number of viral copies(<138 copies/mL). A negative result must be combined with clinical observations, patient  history, and epidemiological information. The expected result is Negative.  Fact Sheet for Patients:  EntrepreneurPulse.com.au  Fact Sheet for Healthcare Providers:  IncredibleEmployment.be  This test is no t yet approved or cleared by the [place] FDA and  has been authorized for detection and/or diagnosis of SARS-CoV-2 by FDA under an Emergency Use Authorization (EUA). This EUA will remain  in effect (meaning this test can be used) for the duration of the COVID-19 declaration under Section 564(b)(1) of the Act, 21 U.S.C.section 360bbb-3(b)(1), unless the authorization is terminated  or revoked sooner.       Influenza A by PCR NEGATIVE NEGATIVE Final   Influenza B by PCR NEGATIVE NEGATIVE Final    Comment: (NOTE) The Xpert Xpress SARS-CoV-2/FLU/RSV plus assay  is intended as an aid in the diagnosis of influenza from Nasopharyngeal swab specimens and should not be used as a sole basis for treatment. Nasal washings and aspirates are unacceptable for Xpert Xpress SARS-CoV-2/FLU/RSV testing.  Fact Sheet for Patients: EntrepreneurPulse.com.au  Fact Sheet for Healthcare Providers: IncredibleEmployment.be  This test is not yet approved or cleared by the [place] FDA and has been authorized for detection and/or diagnosis of SARS-CoV-2 by FDA under an Emergency Use Authorization (EUA). This EUA will remain in effect (meaning this test can be used) for the duration of the COVID-19 declaration under Section 564(b)(1) of the Act, 21 U.S.C. section 360bbb-3(b)(1), unless the authorization is terminated or revoked.  Performed at [facility], [address] [place], [place] [address]   Gastrointestinal Panel by PCR , Stool     Status: None   Collection Time: 09/12/20  3:21 PM   Specimen: STOOL  Result Value Ref Range Status   Campylobacter species NOT DETECTED NOT DETECTED Final   Plesimonas  shigelloides NOT DETECTED NOT DETECTED Final   Salmonella species NOT DETECTED NOT DETECTED Final   Yersinia enterocolitica NOT DETECTED NOT DETECTED Final   Vibrio species NOT DETECTED NOT DETECTED Final   Vibrio cholerae NOT DETECTED NOT DETECTED Final   Enteroaggregative E coli (EAEC) NOT DETECTED NOT DETECTED Final   Enteropathogenic E coli (EPEC) NOT DETECTED NOT DETECTED Final   Enterotoxigenic E coli (ETEC) NOT DETECTED NOT DETECTED Final   Shiga like toxin producing E coli (STEC) NOT DETECTED NOT DETECTED Final   Shigella/Enteroinvasive E coli (EIEC) NOT DETECTED NOT DETECTED Final   Cryptosporidium NOT DETECTED NOT DETECTED Final   Cyclospora cayetanensis NOT DETECTED NOT DETECTED Final   Entamoeba histolytica NOT DETECTED NOT DETECTED Final   Giardia lamblia NOT DETECTED NOT DETECTED Final   Adenovirus F40/41 NOT DETECTED NOT DETECTED Final   Astrovirus NOT DETECTED NOT DETECTED Final   Norovirus GI/GII NOT DETECTED NOT DETECTED Final   Rotavirus A NOT DETECTED NOT DETECTED Final   Sapovirus (I, II, IV, and V) NOT DETECTED NOT DETECTED Final    Comment: Performed at [facility], [address] [place], [place] [address]  C Difficile Quick Screen w PCR reflex     Status: Abnormal   Collection Time: 09/12/20  3:21 PM   Specimen: STOOL  Result Value Ref Range Status   C Diff antigen POSITIVE (A) NEGATIVE Final   C Diff toxin POSITIVE (A) NEGATIVE Final   C Diff interpretation Toxin producing C. difficile detected.  Final    Comment: CRITICAL RESULT CALLED TO, READ BACK BY AND VERIFIED WITH: RAQUEL DAVID @2057  ON 09/12/20 SKL Performed at [facility], [address] [place], [place] [address]          Radiology Studies: DG Abd 1 View  Result Date: 09/20/2020 CLINICAL DATA:  Abdominal pain EXAM: ABDOMEN - 1 VIEW COMPARISON:  05/14/2016 FINDINGS: No bowel dilatation to suggest obstruction. No evidence of pneumoperitoneum, portal venous gas or  pneumatosis. No pathologic calcifications along the expected course of the ureters. Lumbar spine spondylosis.  Mild osteoarthritis of the right hip. IMPRESSION: 1. No acute abdominal abnormality. Electronically Signed   By: Kathreen Devoid   On: 09/20/2020 13:10        Scheduled Meds: . acidophilus  1 capsule Oral Daily  . atorvastatin  10 mg Oral Daily  . cholestyramine  4 g Oral BID  . feeding supplement  237 mL Oral BID BM  .  fidaxomicin  200 mg Oral BID  . metroNIDAZOLE  500 mg Oral Q8H  . sodium chloride flush  10-40 mL Intracatheter Q12H  . vancomycin  125 mg Oral Q6H   Continuous Infusions: . sodium chloride Stopped (09/20/20 2025)     LOS: 9 days    Time spent: 15 minutes    Sidney Ace, MD [facility] Pager [phone]  If 7PM-7AM, please contact night-coverage 09/21/2020, 12:37 PM

## 2020-09-22 DIAGNOSIS — R6520 Severe sepsis without septic shock: Secondary | ICD-10-CM | POA: Diagnosis not present

## 2020-09-22 DIAGNOSIS — A419 Sepsis, unspecified organism: Secondary | ICD-10-CM | POA: Diagnosis not present

## 2020-09-22 LAB — CBC WITH DIFFERENTIAL/PLATELET
Abs Immature Granulocytes: 0.88 10*3/uL — ABNORMAL HIGH (ref 0.00–0.07)
Basophils Absolute: 0.1 10*3/uL (ref 0.0–0.1)
Basophils Relative: 0 %
Eosinophils Absolute: 0 10*3/uL (ref 0.0–0.5)
Eosinophils Relative: 0 %
HCT: 27 % — ABNORMAL LOW (ref 39.0–52.0)
Hemoglobin: 8.7 g/dL — ABNORMAL LOW (ref 13.0–17.0)
Immature Granulocytes: 5 %
Lymphocytes Relative: 27 %
Lymphs Abs: 4.5 10*3/uL — ABNORMAL HIGH (ref 0.7–4.0)
MCH: 28.6 pg (ref 26.0–34.0)
MCHC: 32.2 g/dL (ref 30.0–36.0)
MCV: 88.8 fL (ref 80.0–100.0)
Monocytes Absolute: 4 10*3/uL — ABNORMAL HIGH (ref 0.1–1.0)
Monocytes Relative: 24 %
Neutro Abs: 7 10*3/uL (ref 1.7–7.7)
Neutrophils Relative %: 44 %
Platelets: 144 10*3/uL — ABNORMAL LOW (ref 150–400)
RBC: 3.04 MIL/uL — ABNORMAL LOW (ref 4.22–5.81)
RDW: 19.6 % — ABNORMAL HIGH (ref 11.5–15.5)
Smear Review: NORMAL
WBC: 16.4 10*3/uL — ABNORMAL HIGH (ref 4.0–10.5)
nRBC: 0.2 % (ref 0.0–0.2)

## 2020-09-22 LAB — MAGNESIUM: Magnesium: 1.9 mg/dL (ref 1.7–2.4)

## 2020-09-22 LAB — BASIC METABOLIC PANEL
Anion gap: 4 — ABNORMAL LOW (ref 5–15)
BUN: 21 mg/dL (ref 8–23)
CO2: 19 mmol/L — ABNORMAL LOW (ref 22–32)
Calcium: 7.2 mg/dL — ABNORMAL LOW (ref 8.9–10.3)
Chloride: 107 mmol/L (ref 98–111)
Creatinine, Ser: 0.92 mg/dL (ref 0.61–1.24)
GFR, Estimated: >60 mL/min (ref >60)
Glucose, Bld: 72 mg/dL (ref 70–99)
Potassium: 4.3 mmol/L (ref 3.5–5.1)
Sodium: 130 mmol/L — ABNORMAL LOW (ref 135–145)

## 2020-09-22 NOTE — TOC Progression Note (Signed)
Transition of Care [facility]) - Progression Note    Patient Details  Name: Dustin Howell MRN: [id number] Date of Birth: 04/18/1925  [facility] CM/SW Contact  Truitt Merle, LCSW Phone Number: 09/22/2020, 10:38 AM  Clinical Narrative:    Per note, patient not medically ready for discharge due to continued, persistent diarrhea despite aggressive treatment. Hospice care referral being considered. TOC continues to follow for discharge needs.   Expected Discharge Plan: [place] Barriers to Discharge: Continued Medical Work up  Expected Discharge Plan and Services Expected Discharge Plan: [place] In-house Referral: Hospice / Palliative Care Discharge Planning Services: CM Consult Post Acute Care Choice: [place] arrangements for the past 2 months: Single Family Home                 DME Arranged: N/A DME Agency: NA       HH Arranged: RN,PT,OT [place] Agency: [facility] Date [place]: 09/18/20 Time [place]: 1632 Representative spoke with at [place]: [place] ([place]) Interventions    Readmission Risk Interventions No flowsheet data found.

## 2020-09-22 NOTE — Progress Notes (Signed)
PROGRESS NOTE    Dustin Howell  CBJ:[id number] DOB: Dec 01, 1924 DOA: 09/12/2020 PCP: [name], MD  Brief Narrative:  87 y.o. male with medical history significant for history of C. difficile colitis,CAD s/p DES, lymphoproliferative disorder, recent lower GI bleed, recent C. difficile infection, hearing impairment, recent discharge from the hospital on 07/26/2020 after hospitalization for right hip fracture s/p IM nailing on 07/21/2020.  He had recently seen Dr. [name] on 09/10/2020 for recurrent diarrhea and he was restarted on oral vancomycin.  He presented to the hospital with frequent diarrhea.  He was admitted to the hospital for severe sepsis secondary to recurrent C. difficile colitis.  He was treated with fidaxomicin, IV Flagyl and IV fluids. He was also found to have left-sided pulmonary embolism, hyponatremia and AKI complicated by hyperkalemia.  After thorough discussion with patient and family regarding management of pulmonary embolism, they decided to proceed with treatment with Eliquis.  However, patient developed acute GI bleeding after 2 doses of Eliquis.  Aspirin and Eliquis were discontinued.  Patient and her daughter declined IVC filter and they do not want any endoscopic work-up.  Patient is having persistent diarrhea, p.o. Questran added 3/9.  When Questran is administered at staggered dosing intervals with Dificid it seems to be effective.  Pharmacy is aware and will create staggered dosing schedule.  Conversation with the patient and the daughter on 3/12.  The patient has persistent diarrhea despite aggressive therapy.  Unfortunately we are rapidly running out of options.  Patient is agreeable to stay over the weekend to see if we can get better hold of his diarrhea otherwise we may need to consider referral to hospice care.  Assessment & Plan:   Principal Problem:   Severe sepsis ([name]) Active Problems:   Acute lower GI bleeding   CAD (coronary artery disease)    Protein-calorie malnutrition, severe   Recurrent colitis due to Clostridium difficile   AKI (acute kidney injury) ([place])   Hyponatremia   Hyperkalemia  Recurrent C. difficile colitis Severe sepsis secondary to above Patient has had persistent diarrhea despite fidaxomicin and Flagyl Sepsis physiology resolving Diarrhea continues despite aggressive therapy 3/13: White count slightly downtrending after initiation of vancomycin and metronidazole Plan: Continue aggressive therapy with Dificid, Questran, p.o. vancomycin, p.o. Flagyl Okay for patient not to have IV access Monitor p.o. intake and bowel movements We will consider referral to hospice care if patient is not responding to above treatment regimen  Acute pulmonary embolism No DVT on bilateral lower extremity duplex Eliquis discontinued because of GI bleeding Family elected to defer endoscopic evaluation Patient stable on room air, normal heart rate, no chest pain or dyspnea Plan: Vitals per unit protocol  Acute GI bleeding/rectal bleeding history of GI bleed  After initiation of Eliquis for GI bleed Patient and daughter defer GI work-up Per daughter patient had a near fatal episode of GI bleeding 5 years ago H&H stable Plan: Defer anticoagulants Daily H&H  Thrombocytopenia No source of bleeding Possibly reactive thrombocytopenia in the setting of infection No indication for transfusion currently   Hyponatremia intravascular volume depletion versus SIADH Mild, asymptomatic Plan: Patient has no IV access.  Okay per MD at this time  CAD s/p DES Aspirin discontinued  because of GI bleeding  Severe protein calorie malnutrition [place] Registered dietitian consulted Continue protein enriched feeding supplements  AKI, hyperkalemia and acute hypoxic respiratory failure: Resolved  S/p right hip IM nailing on 07/21/2020.     DVT prophylaxis: SCDs Code Status: DNR Family Communication:  Daughter [name] [phone] bedside on 3/12.  Left VM on 3/13  disposition Plan: Status is: Inpatient  Remains inpatient appropriate because:Inpatient level of care appropriate due to severity of illness   Dispo: The patient is from: Home              Anticipated d/c is to: Home              Patient currently is not medically stable to d/c.   Difficult to place patient No  Persistent diarrhea in the setting of refractory C. difficile colitis.  On broad coverage with multiple agents.  I will continue to treat aggressively over the weekend.  If patient does not respond we will consider referral to hospice care.    Level of care: Med-Surg  Consultants:   None  Procedures:   None  Antimicrobials:   Dificid   Subjective: Patient seen and examined.  Diarrhea persistent.  Intermittently refusing p.o. cholestyramine yesterday.  Objective: Vitals:   09/21/20 1934 09/21/20 2348 09/22/20 0505 09/22/20 0917  BP: 117/64 121/60 113/64 114/71  Pulse: 79 99 97 95  Resp: 17 19 18 16   Temp: 97.9 F (36.6 C) (!) 97.5 F (36.4 C) 98 F (36.7 C) 97.6 F (36.4 C)  TempSrc: Oral Oral Axillary   SpO2: 92% 94% 95% 95%  Weight:   58.1 kg   Height:        Intake/Output Summary (Last 24 hours) at 09/22/2020 0933 Last data filed at 09/22/2020 0000 Gross per 24 hour  Intake 50 ml  Output 100 ml  Net -50 ml   Filed Weights   09/20/20 0154 09/21/20 0014 09/22/20 0505  Weight: 58 kg 57 kg 58.1 kg    Examination:  General exam: Appears calm and comfortable  Respiratory system: Clear to auscultation. Respiratory effort normal. Cardiovascular system: S1 & S2 heard, RRR. No JVD, murmurs, rubs, gallops or clicks. No pedal edema. Gastrointestinal system: Soft, nontender, nondistended, hyperactive bowel sounds Central nervous system: Alert and oriented. No focal neurological deficits. Extremities: Scrotal edema, bilateral lower extremity edema Skin: No rashes, lesions or ulcers Psychiatry:  Judgement and insight appear normal. Mood & affect appropriate.     Data Reviewed: I have personally reviewed following labs and imaging studies  CBC: Recent Labs  Lab 09/18/20 0622 09/19/20 0727 09/20/20 0426 09/21/20 0420 09/22/20 0437  WBC 22.4* 17.9* 19.8* 22.2* 16.4*  NEUTROABS 6.7 8.6* 7.4 8.8* 7.0  HGB 10.8* 9.6* 9.3* 9.3* 8.7*  HCT 34.3* 30.0* 31.1* 30.0* 27.0*  MCV 90.7 90.1 95.4 91.7 88.8  PLT 122* 125* 131* 150 761*   Basic Metabolic Panel: Recent Labs  Lab 09/16/20 0905 09/17/20 0743 09/18/20 0622 09/19/20 0727 09/20/20 0426 09/21/20 0420 09/22/20 0437  NA 129*   < > 130* 130* 132* 134* 130*  K 4.6   < > 4.5 4.0 4.2 4.3 4.3  CL 104   < > 104 104 107 109 107  CO2 19*   < > 20* 19* 20* 18* 19*  GLUCOSE 94   < > 85 86 74 106* 72  BUN 21   < > 20 21 20 20 21   CREATININE 0.94   < > 0.95 0.90 0.86 0.86 0.92  CALCIUM 7.5*   < > 7.4* 7.0* 7.1* 7.4* 7.2*  MG 2.0  --  2.0 1.8 1.8 1.9 1.9  PHOS 2.7  --  2.8  --   --   --   --    < > =  values in this interval not displayed.   GFR: Estimated Creatinine Clearance: 40.3 mL/min (by C-G formula based on SCr of 0.92 mg/dL). Liver Function Tests: No results for input(s): AST, ALT, ALKPHOS, BILITOT, PROT, ALBUMIN in the last 168 hours. No results for input(s): LIPASE, AMYLASE in the last 168 hours. No results for input(s): AMMONIA in the last 168 hours. Coagulation Profile: No results for input(s): INR, PROTIME in the last 168 hours. Cardiac Enzymes: No results for input(s): CKTOTAL, CKMB, CKMBINDEX, TROPONINI in the last 168 hours. BNP (last 3 results) No results for input(s): PROBNP in the last 8760 hours. HbA1C: No results for input(s): HGBA1C in the last 72 hours. CBG: No results for input(s): GLUCAP in the last 168 hours. Lipid Profile: No results for input(s): CHOL, HDL, LDLCALC, TRIG, CHOLHDL, LDLDIRECT in the last 72 hours. Thyroid Function Tests: No results for input(s): TSH, T4TOTAL, FREET4, T3FREE,  THYROIDAB in the last 72 hours. Anemia Panel: No results for input(s): VITAMINB12, FOLATE, FERRITIN, TIBC, IRON, RETICCTPCT in the last 72 hours. Sepsis Labs: No results for input(s): PROCALCITON, LATICACIDVEN in the last 168 hours.  Recent Results (from the past 240 hour(s))  Culture, blood (Routine x 2)     Status: None   Collection Time: 09/12/20  3:21 PM   Specimen: BLOOD  Result Value Ref Range Status   Specimen Description BLOOD RIGHT ANTECUBITAL  Final   Special Requests   Final    BOTTLES DRAWN AEROBIC AND ANAEROBIC Blood Culture adequate volume   Culture   Final    NO GROWTH 5 DAYS Performed at [facility], [address] [place], [place] [address]    Report Status 09/17/2020 FINAL  Final  Culture, blood (Routine x 2)     Status: None   Collection Time: 09/12/20  3:21 PM   Specimen: BLOOD  Result Value Ref Range Status   Specimen Description BLOOD LEFT ANTECUBITAL  Final   Special Requests   Final    BOTTLES DRAWN AEROBIC AND ANAEROBIC Blood Culture adequate volume   Culture   Final    NO GROWTH 5 DAYS Performed at [facility], [address] [place], [place] [address]    Report Status 09/17/2020 FINAL  Final  Resp Panel by RT-PCR (Flu A&B, Covid) Nasopharyngeal Swab     Status: None   Collection Time: 09/12/20  3:21 PM   Specimen: Nasopharyngeal Swab; Nasopharyngeal(NP) swabs in vial transport medium  Result Value Ref Range Status   SARS Coronavirus 2 by RT PCR NEGATIVE NEGATIVE Final    Comment: (NOTE) SARS-CoV-2 target nucleic acids are NOT DETECTED.  The SARS-CoV-2 RNA is generally detectable in upper respiratory specimens during the acute phase of infection. The lowest concentration of SARS-CoV-2 viral copies this assay can detect is 138 copies/mL. A negative result does not preclude SARS-Cov-2 infection and should not be used as the sole basis for treatment or other patient management decisions. A negative result may occur with   improper specimen collection/handling, submission of specimen other than nasopharyngeal swab, presence of viral mutation(s) within the areas targeted by this assay, and inadequate number of viral copies(<138 copies/mL). A negative result must be combined with clinical observations, patient history, and epidemiological information. The expected result is Negative.  Fact Sheet for Patients:  EntrepreneurPulse.com.au  Fact Sheet for Healthcare Providers:  IncredibleEmployment.be  This test is no t yet approved or cleared by the [place] FDA and  has been authorized for detection and/or diagnosis of SARS-CoV-2 by FDA  under an Emergency Use Authorization (EUA). This EUA will remain  in effect (meaning this test can be used) for the duration of the COVID-19 declaration under Section 564(b)(1) of the Act, 21 U.S.C.section 360bbb-3(b)(1), unless the authorization is terminated  or revoked sooner.       Influenza A by PCR NEGATIVE NEGATIVE Final   Influenza B by PCR NEGATIVE NEGATIVE Final    Comment: (NOTE) The Xpert Xpress SARS-CoV-2/FLU/RSV plus assay is intended as an aid in the diagnosis of influenza from Nasopharyngeal swab specimens and should not be used as a sole basis for treatment. Nasal washings and aspirates are unacceptable for Xpert Xpress SARS-CoV-2/FLU/RSV testing.  Fact Sheet for Patients: EntrepreneurPulse.com.au  Fact Sheet for Healthcare Providers: IncredibleEmployment.be  This test is not yet approved or cleared by the [place] FDA and has been authorized for detection and/or diagnosis of SARS-CoV-2 by FDA under an Emergency Use Authorization (EUA). This EUA will remain in effect (meaning this test can be used) for the duration of the COVID-19 declaration under Section 564(b)(1) of the Act, 21 U.S.C. section 360bbb-3(b)(1), unless the authorization is terminated  or revoked.  Performed at [facility], [address] [place], [place] [address]   Gastrointestinal Panel by PCR , Stool     Status: None   Collection Time: 09/12/20  3:21 PM   Specimen: STOOL  Result Value Ref Range Status   Campylobacter species NOT DETECTED NOT DETECTED Final   Plesimonas shigelloides NOT DETECTED NOT DETECTED Final   Salmonella species NOT DETECTED NOT DETECTED Final   Yersinia enterocolitica NOT DETECTED NOT DETECTED Final   Vibrio species NOT DETECTED NOT DETECTED Final   Vibrio cholerae NOT DETECTED NOT DETECTED Final   Enteroaggregative E coli (EAEC) NOT DETECTED NOT DETECTED Final   Enteropathogenic E coli (EPEC) NOT DETECTED NOT DETECTED Final   Enterotoxigenic E coli (ETEC) NOT DETECTED NOT DETECTED Final   Shiga like toxin producing E coli (STEC) NOT DETECTED NOT DETECTED Final   Shigella/Enteroinvasive E coli (EIEC) NOT DETECTED NOT DETECTED Final   Cryptosporidium NOT DETECTED NOT DETECTED Final   Cyclospora cayetanensis NOT DETECTED NOT DETECTED Final   Entamoeba histolytica NOT DETECTED NOT DETECTED Final   Giardia lamblia NOT DETECTED NOT DETECTED Final   Adenovirus F40/41 NOT DETECTED NOT DETECTED Final   Astrovirus NOT DETECTED NOT DETECTED Final   Norovirus GI/GII NOT DETECTED NOT DETECTED Final   Rotavirus A NOT DETECTED NOT DETECTED Final   Sapovirus (I, II, IV, and V) NOT DETECTED NOT DETECTED Final    Comment: Performed at [facility], [address] [place], [place] [address]  C Difficile Quick Screen w PCR reflex     Status: Abnormal   Collection Time: 09/12/20  3:21 PM   Specimen: STOOL  Result Value Ref Range Status   C Diff antigen POSITIVE (A) NEGATIVE Final   C Diff toxin POSITIVE (A) NEGATIVE Final   C Diff interpretation Toxin producing C. difficile detected.  Final    Comment: CRITICAL RESULT CALLED TO, READ BACK BY AND VERIFIED WITH: RAQUEL DAVID @2057  ON 09/12/20 SKL Performed at [facility],  [address], [place], [place] [address]          Radiology Studies: DG Abd 1 View  Result Date: 09/20/2020 CLINICAL DATA:  Abdominal pain EXAM: ABDOMEN - 1 VIEW COMPARISON:  05/14/2016 FINDINGS: No bowel dilatation to suggest obstruction. No evidence of pneumoperitoneum, portal venous gas or pneumatosis. No pathologic calcifications along the expected  course of the ureters. Lumbar spine spondylosis.  Mild osteoarthritis of the right hip. IMPRESSION: 1. No acute abdominal abnormality. Electronically Signed   By: Kathreen Devoid   On: 09/20/2020 13:10        Scheduled Meds: . acidophilus  1 capsule Oral Daily  . atorvastatin  10 mg Oral Daily  . cholestyramine  4 g Oral BID  . feeding supplement  237 mL Oral BID BM  . fidaxomicin  200 mg Oral BID  . metroNIDAZOLE  500 mg Oral Q8H  . sodium chloride flush  10-40 mL Intracatheter Q12H  . vancomycin  125 mg Oral Q6H   Continuous Infusions:    LOS: 10 days    Time spent: 15 minutes    Sidney Ace, MD [facility] Pager [phone]  If 7PM-7AM, please contact night-coverage 09/22/2020, 9:33 AM

## 2020-09-22 NOTE — Plan of Care (Signed)
Shift Summary: No acute events overnight, VSS. Incontinence care provided PRN, adequate UO and multiple small bowel movements overnight. No PIV access. Refused majority of PO intake offered, able to get PO ABX down with applesauce, did not want to drink cholesysteramine packet despite multiple attempts. Fall/safety precautions maintained, rounding performed, needs/concerns addressed. Pt anxious to get home and out of the hospital.   Problem: Education: Goal: Knowledge of General Education information will improve Description: Including pain rating scale, medication(s)/side effects and non-pharmacologic comfort measures Outcome: Progressing   Problem: Clinical Measurements: Goal: Ability to maintain clinical measurements within normal limits will improve Outcome: Progressing Goal: Will remain free from infection Outcome: Progressing Goal: Diagnostic test results will improve Outcome: Progressing Goal: Respiratory complications will improve Outcome: Progressing Goal: Cardiovascular complication will be avoided Outcome: Progressing   Problem: Activity: Goal: Risk for activity intolerance will decrease Outcome: Progressing   Problem: Nutrition: Goal: Adequate nutrition will be maintained Outcome: Progressing   Problem: Coping: Goal: Level of anxiety will decrease Outcome: Progressing   Problem: Pain Managment: Goal: General experience of comfort will improve Outcome: Progressing   Problem: Safety: Goal: Ability to remain free from injury will improve Outcome: Progressing   Problem: Skin Integrity: Goal: Risk for impaired skin integrity will decrease Outcome: Progressing   Problem: Fluid Volume: Goal: Hemodynamic stability will improve Outcome: Progressing   Problem: Clinical Measurements: Goal: Diagnostic test results will improve Outcome: Progressing Goal: Signs and symptoms of infection will decrease Outcome: Progressing   Problem: Respiratory: Goal: Ability to  maintain adequate ventilation will improve Outcome: Progressing

## 2020-09-23 DIAGNOSIS — A0472 Enterocolitis due to Clostridium difficile, not specified as recurrent: Secondary | ICD-10-CM

## 2020-09-23 LAB — BASIC METABOLIC PANEL
Anion gap: 4 — ABNORMAL LOW (ref 5–15)
BUN: 20 mg/dL (ref 8–23)
CO2: 20 mmol/L — ABNORMAL LOW (ref 22–32)
Calcium: 7.2 mg/dL — ABNORMAL LOW (ref 8.9–10.3)
Chloride: 109 mmol/L (ref 98–111)
Creatinine, Ser: 0.85 mg/dL (ref 0.61–1.24)
GFR, Estimated: >60 mL/min (ref >60)
Glucose, Bld: 76 mg/dL (ref 70–99)
Potassium: 3.8 mmol/L (ref 3.5–5.1)
Sodium: 133 mmol/L — ABNORMAL LOW (ref 135–145)

## 2020-09-23 LAB — CBC WITH DIFFERENTIAL/PLATELET
Abs Immature Granulocytes: 0.71 10*3/uL — ABNORMAL HIGH (ref 0.00–0.07)
Basophils Absolute: 0.1 10*3/uL (ref 0.0–0.1)
Basophils Relative: 1 %
Eosinophils Absolute: 0 10*3/uL (ref 0.0–0.5)
Eosinophils Relative: 0 %
HCT: 28.8 % — ABNORMAL LOW (ref 39.0–52.0)
Hemoglobin: 9.4 g/dL — ABNORMAL LOW (ref 13.0–17.0)
Immature Granulocytes: 5 %
Lymphocytes Relative: 30 %
Lymphs Abs: 3.9 10*3/uL (ref 0.7–4.0)
MCH: 28.9 pg (ref 26.0–34.0)
MCHC: 32.6 g/dL (ref 30.0–36.0)
MCV: 88.6 fL (ref 80.0–100.0)
Monocytes Absolute: 2.9 10*3/uL — ABNORMAL HIGH (ref 0.1–1.0)
Monocytes Relative: 22 %
Neutro Abs: 5.5 10*3/uL (ref 1.7–7.7)
Neutrophils Relative %: 42 %
Platelets: 149 10*3/uL — ABNORMAL LOW (ref 150–400)
RBC: 3.25 MIL/uL — ABNORMAL LOW (ref 4.22–5.81)
RDW: 20.2 % — ABNORMAL HIGH (ref 11.5–15.5)
Smear Review: NORMAL
WBC: 13.1 10*3/uL — ABNORMAL HIGH (ref 4.0–10.5)
nRBC: 0.2 % (ref 0.0–0.2)

## 2020-09-23 LAB — MAGNESIUM: Magnesium: 2 mg/dL (ref 1.7–2.4)

## 2020-09-23 MED ORDER — VANCOMYCIN HCL 125 MG PO CAPS: 125.0000 mg | ORAL_CAPSULE | Freq: Four times a day (QID) | ORAL | 0 refills | Status: AC

## 2020-09-23 MED ORDER — CHOLESTYRAMINE 4 G PO PACK
4.0000 g | PACK | Freq: Two times a day (BID) | ORAL | 0 refills | Status: AC | PRN
Start: 2020-09-23 — End: 2020-10-07

## 2020-09-23 MED ORDER — ONDANSETRON 4 MG PO TBDP: 4.0000 mg | ORAL_TABLET | Freq: Three times a day (TID) | ORAL | 0 refills | Status: AC | PRN

## 2020-09-23 MED ORDER — RISAQUAD PO CAPS: 1.0000 | ORAL_CAPSULE | Freq: Every day | ORAL | 0 refills | Status: AC

## 2020-09-23 NOTE — Progress Notes (Signed)
[place] [facility]) Hospital Liaison RN note:  Received request from Quinn Axe, NP and Doran Clay, [facility] for hospice services at home after discharge. Chart and patient information has been reviewed by [facility] physician and hospice eligibility was approved.  Spoke with daughter, [name], to initiate education related to hospice philosophy, services and to answer any questions. Renee verbalized understanding and all questions were answered. Plan is for discharge home by private vehicle once DME is in place. Hospital care team is aware.  DME needs discussed. Family requests a hospital bed, OBT and transport wheelchair. DME is scheduled to be delivered today. Address and contact is correct on facesheet.  Please send signed and completed DNR home with patient. Please provide prescriptions at discharge as needed to ensure ongoing symptom management.  Please call with any hospice related questions or concerns.  Thank you for the opportunity to participate in this patient's care.  Zandra Abts, RN [facility] Liaison [phone]

## 2020-09-23 NOTE — Care Management Important Message (Signed)
Important Message  Patient Details  Name: Dustin Howell MRN: [id number] Date of Birth: 17-Apr-1925   Medicare Important Message Given:  Yes  [name], daughter returned my call and I reviewed the Important Message from Medicare with her.  She stated, she is in agreement with the discharge plan.  I asked if she would like a copy of the form but she replied she did not as she didn't plan to appeal the discharge.  I thanked her for time.   [name] A Kassidi Elza 09/23/2020, 12:39 PM

## 2020-09-23 NOTE — Progress Notes (Signed)
Physical Therapy Treatment Patient Details Name: Dustin Howell MRN: [id number] DOB: May 08, 1925 Today's Date: 09/23/2020    History of Present Illness Pt is a 87 y.o. male presenting to ED d/t abnormal labs, diarrhea for about 1 month (recent C-diff infection), persistent generalized weakness, and SOB. S/p R IMN intertrochantric 07/21/20.  Pt admitted with severe sepsis d/t recurrent C. difficile colitis, acute respiratory failure with hypoxia, AKI, hyponatremia, hyperkalemia. Also diagnosed with acute PE but after 2 doses of Eliquis pt with acute GI bleed so anti-coagulation stopped; declined IVC filter.  PMH includes h/o GI bleed (Dec 2021), HOH (R sided hearing aid), anemia, lymphoproliferative disorder, CKD, B rotator cuff issues.    PT Comments    Pt resting in bed upon PT arrival; pt's daughter present.  Pt initially declining OOB mobility but then willing with a lot of encouragement from pt's daughter (pt's daughter requesting to see how much help pt needs for discharge home).  Min to mod assist for bed mobility; min to mod assist for transfers; and CGA to sidestep a few feet to L along bed with RW.  Limited activity d/t fatigue (pt assisted back to bed and repositioned for comfort).  Discussed current assist levels with pt and pt's daughter and also recommendations for [facility] and RW (already have at home) and also hospital bed and w/c (TOC notified of DME needs).  Also discussed how assist levels may fluctuate depending on time of day, fatigue, and sitting surface; also discussed use of w/c in home and pacing/modifications: pt's daughter verbalizing appropriate understanding.  Continue to recommend SNF but pt and pt's daughter plan for pt to discharge home (d/t this, therapy educated pt and pt's daughter on need for 24/7 assist).   Follow Up Recommendations  SNF;Supervision/Assistance - 24 hour     Equipment Recommendations  Rolling walker with 5" wheels;3in1 (PT);Wheelchair (measurements  PT);Wheelchair cushion (measurements PT);Hospital bed    Recommendations for Other Services       Precautions / Restrictions Precautions Precautions: Fall Restrictions Weight Bearing Restrictions: No Other Position/Activity Restrictions: Per [facility] note 1/24: WBAT with a walker (s/p R IMN intertrochantric 07/21/20)    Mobility  Bed Mobility Overal bed mobility: Needs Assistance Bed Mobility: Supine to Sit;Sit to Supine     Supine to sit: Min assist;Mod assist;HOB elevated (assist for trunk) Sit to supine: Min assist;HOB elevated (assist for B LE's)   General bed mobility comments: vc's for technique    Transfers Overall transfer level: Needs assistance Equipment used: Rolling walker (2 wheeled) Transfers: Sit to/from Stand Sit to Stand: Min assist;Mod assist         General transfer comment: assist to initiate and come to full stand up to RW; vc's for UE and LE placement; vc's for overall technique  Ambulation/Gait Ambulation/Gait assistance: Min guard Gait Distance (Feet):  (sidestepped a few feet towards L side with RW use) Assistive device: Rolling walker (2 wheeled)   Gait velocity: decreased   General Gait Details: vc's for technique; CGA for safety   Stairs             Wheelchair Mobility    Modified Rankin (Stroke Patients Only)       Balance Overall balance assessment: Needs assistance Sitting-balance support: No upper extremity supported;Feet supported Sitting balance-Leahy Scale: Good Sitting balance - Comments: steady sitting reaching within BOS   Standing balance support: Single extremity supported Standing balance-Leahy Scale: Fair Standing balance comment: pt requiring at least single UE support for static standing balance  Cognition Arousal/Alertness: Awake/alert Behavior During Therapy: WFL for tasks assessed/performed Overall Cognitive Status: Within Functional Limits for tasks  assessed                                 General Comments: HOH      Exercises      General Comments   Nursing cleared pt for participation in physical therapy.      Pertinent Vitals/Pain Pain Assessment: No/denies pain  O2 sats 94% room air and HR 100 bpm at rest end of session.    Home Living                      Prior Function            PT Goals (current goals can now be found in the care plan section) Acute Rehab PT Goals Patient Stated Goal: to go home PT Goal Formulation: With patient Time For Goal Achievement: 09/30/20 Potential to Achieve Goals: Fair Progress towards PT goals: Progressing toward goals    Frequency    Min 2X/week      PT Plan Current plan remains appropriate    Co-evaluation              AM-PAC PT "6 Clicks" Mobility   Outcome Measure  Help needed turning from your back to your side while in a flat bed without using bedrails?: None Help needed moving from lying on your back to sitting on the side of a flat bed without using bedrails?: A Little Help needed moving to and from a bed to a chair (including a wheelchair)?: A Little Help needed standing up from a chair using your arms (e.g., wheelchair or bedside chair)?: A Lot Help needed to walk in hospital room?: A Little Help needed climbing 3-5 steps with a railing? : A Lot 6 Click Score: 17    End of Session Equipment Utilized During Treatment: Gait belt Activity Tolerance: Patient limited by fatigue Patient left: in bed;with call bell/phone within reach;with bed alarm set;Other (comment);with family/visitor present (B heels floating via pillow support) Nurse Communication: Mobility status;Precautions PT Visit Diagnosis: Other abnormalities of gait and mobility (R26.89);Muscle weakness (generalized) (M62.81);Difficulty in walking, not elsewhere classified (R26.2);History of falling (Z91.81)     Time: 0938-1829 PT Time Calculation (min) (ACUTE ONLY): 34  min  Charges:  $Therapeutic Activity: 23-37 mins                    Leitha Bleak, PT 09/23/20, 10:47 AM

## 2020-09-23 NOTE — TOC Transition Note (Signed)
Transition of Care [facility]) - CM/SW Discharge Note   Patient Details  Name: Dustin Howell MRN: [id number] Date of Birth: 1924-07-31  Transition of Care [facility]) CM/SW Contact:  Shelbie Hutching, RN Phone Number: 09/23/2020, 3:10 PM   Clinical Narrative:    Patient will discharge home today under hospice care.  Equipment will be delivered today.  RNCM informed Joseph Art, patient's daughter, that once the equipment has been delivered she can come and pick up her father.  Patient has clothes in the room.  Renee reports that the equipment should arrive around 1630.  Daughter will transport patient home.     Final next level of care: Home w Hospice Care Barriers to Discharge: Barriers Resolved   Patient Goals and CMS Choice Patient states their goals for this hospitalization and ongoing recovery are:: Daughter wants to take the patient home with home health services CMS Medicare.gov Compare Post Acute Care list provided to:: Patient Choice offered to / list presented to : [place]  Discharge Placement                       Discharge Plan and Services In-house Referral: Hospice / Palliative Care Discharge Planning Services: CM Consult Post Acute Care Choice: Home Health          DME Arranged: Hospital bed,Lightweight manual wheelchair with seat cushion DME Agency: Other - Comment [facility])     Representative spoke with at DME Agency: Kieth Brightly HH Arranged: NA [place] Agency: Other - See comment [facility]) Date [facility] Agency Contacted: 09/23/20 Time [place]: 1115 Representative spoke with at [place]: [place] ([place]) Interventions     Readmission Risk Interventions No flowsheet data found.

## 2020-09-23 NOTE — TOC Progression Note (Signed)
Transition of Care [facility]) - Progression Note    Patient Details  Name: Dustin Howell MRN: [id number] Date of Birth: 1925-02-12  Transition of Care [facility]) CM/SW Contact  Shelbie Hutching, RN Phone Number: 09/23/2020, 10:30 AM  Clinical Narrative:    Patient really wants to go home and he reported to MD that he would not want to come back to the hospital.  When [facility] talked with the patient's daughter at the bedside about home with hospice she said as long as we call it Palliative.  Reinforced that patient still has C diff and will cont to have diarrhea at home, goal is for patient to not have to be readmitted. Daughter would like to speak with palliative care.  Message sent to Palliative NP to see if she can stop by to speak with patient and daughter.    Expected Discharge Plan: [place] Barriers to Discharge: Continued Medical Work up  Expected Discharge Plan and Services Expected Discharge Plan: [place] In-house Referral: Hospice / Palliative Care Discharge Planning Services: CM Consult Post Acute Care Choice: [place] arrangements for the past 2 months: Single Family Home                 DME Arranged: N/A DME Agency: NA       HH Arranged: RN,PT,OT [place] Agency: [facility] [place] Date [place]: 09/18/20 Time [place]: 1632 Representative spoke with at [place]: [place] ([place]) Interventions    Readmission Risk Interventions No flowsheet data found.

## 2020-09-23 NOTE — Plan of Care (Signed)
Shift Summary: Pt Aox2, remains on RA, VSS. X2 BM overnight, unmeasured UO. Continues to endorse strong feelings to go home and have everyone "stop moving him around" in reference to incontinence care. Continues to have weeping from LUE, +2-3 edema throughout. Took all scheduled medications tonight. Fall/safety precautions in place, rounding performed, needs/concerns addressed during shift.   Problem: Education: Goal: Knowledge of General Education information will improve Description: Including pain rating scale, medication(s)/side effects and non-pharmacologic comfort measures Outcome: Progressing   Problem: Clinical Measurements: Goal: Ability to maintain clinical measurements within normal limits will improve Outcome: Progressing Goal: Will remain free from infection Outcome: Progressing Goal: Diagnostic test results will improve Outcome: Progressing Goal: Respiratory complications will improve Outcome: Progressing Goal: Cardiovascular complication will be avoided Outcome: Progressing   Problem: Activity: Goal: Risk for activity intolerance will decrease Outcome: Progressing   Problem: Nutrition: Goal: Adequate nutrition will be maintained Outcome: Progressing   Problem: Coping: Goal: Level of anxiety will decrease Outcome: Progressing   Problem: Pain Managment: Goal: General experience of comfort will improve Outcome: Progressing   Problem: Safety: Goal: Ability to remain free from injury will improve Outcome: Progressing   Problem: Skin Integrity: Goal: Risk for impaired skin integrity will decrease Outcome: Progressing   Problem: Fluid Volume: Goal: Hemodynamic stability will improve Outcome: Progressing   Problem: Clinical Measurements: Goal: Diagnostic test results will improve Outcome: Progressing Goal: Signs and symptoms of infection will decrease Outcome: Progressing   Problem: Respiratory: Goal: Ability to maintain adequate ventilation will  improve Outcome: Progressing

## 2020-09-23 NOTE — Care Management Important Message (Signed)
Important Message  Patient Details  Name: Dustin Howell MRN: [id number] Date of Birth: 26-Jul-1924   Medicare Important Message Given:  Other (see comment)  [name] returned my call on Friday after work hours and left a voice message that her phone hadn't rang.  I called again this morning and left another voice message asking her to call me at her convenience so I could review the Important Message from Medicare. I will await a return call.   [name] A [name] 09/23/2020, 12:15 PM

## 2020-09-23 NOTE — Discharge Summary (Signed)
Physician Discharge Summary  WEBER MONNIER OZH:[id number] DOB: [birth year] DOA: 09/12/2020  PCP: [name], MD  Admit date: 09/12/2020 Discharge date: 09/23/2020  Admitted From: Home Disposition: Home with hospice  Recommendations for Outpatient Follow-up:  1. Follow-up with hospice medical providers  Home Health: No Equipment/Devices: None Discharge Condition: Hospice CODE STATUS: DNR Diet recommendation: Regular Brief/Interim Summary: 87 y.o.malewith medical history significant for history of C. difficile colitis,CAD s/p DES, lymphoproliferative disorder, recent lower GI bleed, recent C. difficile infection, hearing impairment, recent discharge from the hospital on 07/26/2020 after hospitalization for right hip fracture s/p IM nailing on 07/21/2020. He had recently seen Dr. [name] on 09/10/2020 for recurrent diarrhea and he was restarted on oral vancomycin. He presented to the hospital with frequent diarrhea.  He was admitted to the hospital for severe sepsis secondary to recurrent C. difficile colitis. He was treated with fidaxomicin, IV Flagyl and IV fluids. He was also found to have left-sided pulmonary embolism, hyponatremia and AKI complicated by hyperkalemia. After thorough discussion with patient and family regarding management of pulmonary embolism, they decided to proceed with treatment with Eliquis. However, patient developed acute GI bleeding after 2 doses of Eliquis. Aspirin and Eliquis were discontinued. Patient and her daughter declined IVC filterand they do not want any endoscopic work-up.  Patient is having persistent diarrhea, p.o. Questran added 3/9.  When Questran is administered at staggered dosing intervals with Dificid it seems to be effective.  Pharmacy is aware and will create staggered dosing schedule.  Conversation with the patient and the daughter on 3/12.  The patient has persistent diarrhea despite aggressive therapy.  Unfortunately we are rapidly  running out of options.  Patient is agreeable to stay over the weekend to see if we can get better hold of his diarrhea otherwise we may need to consider referral to hospice care.  On day of discharge patient's clinical status actually improved.  He is still miserable, not eating and having loose bowel movements.  I had a lengthy conversation with the patient's daughter today.  I explained that patient desperately wants to go home however it is very possible that his clinical situation could worsen there.  At this time involve palliative care spoke with the patient's daughter.  Decision was made to go home with hospice care and not to rehospitalize.  Hospice liaison involved in DME delivered to patient's house.  At time of discharge will prescribe p.o. vancomycin 125 4 times daily x2weeks.  Will not prescribe taper.  Also adding daily probiotic, Zofran, Questran as needed.   Discharge Diagnoses:  Principal Problem:   Severe sepsis ([place]) Active Problems:   Acute lower GI bleeding   CAD (coronary artery disease)   Protein-calorie malnutrition, severe   Recurrent colitis due to Clostridium difficile   AKI (acute kidney injury) ([place])   Hyponatremia   Hyperkalemia  Severe protein calorie malnutrition Anasarca Adult failure to thrive Registered dietitian consulted Continue protein enriched feeding supplements Patient will discharge home with hospice services Symptom management per hospice protocol   Recurrent C. difficile colitis Severe sepsis secondary to above Patient has had persistent diarrhea despite fidaxomicin and Flagyl Sepsis physiology resolving Diarrhea continues despite aggressive therapy 3/13: White count slightly downtrending after initiation of vancomycin and metronidazole White count continues to decrease at time of discharge.  Will discontinue Flagyl on discharge patient on p.o. vancomycin and p.o. Questran  Acute pulmonary embolism No DVT on bilateral lower extremity  duplex Eliquis discontinued because of GI bleeding Family elected to defer  endoscopic evaluation Patient stable on room air, normal heart rate, no chest pain or dyspnea No anticoagulation at time of discharge  Acute GI bleeding/rectal bleeding history of GI bleed After initiation of Eliquis for GI bleed Patient and daughter defer GI work-up Per daughter patient had a near fatal episode of GI bleeding 5 years ago H&H stable No anticoagulation at time of discharge  Thrombocytopenia No source of bleeding Possibly reactive thrombocytopenia in the setting of infection No indication for transfusion currently   Hyponatremia intravascular volume depletion versus SIADH Mild, asymptomatic   CAD s/p DES Aspirin discontinued because of GI bleeding    AKI, hyperkalemia and acute hypoxic respiratory failure: Resolved  S/p right hip IM nailing on 07/21/2020.  Discharge Instructions  Discharge Instructions    Diet - low sodium heart healthy   Complete by: As directed    Increase activity slowly   Complete by: As directed    No wound care   Complete by: As directed      Allergies as of 09/23/2020   No Known Allergies     Medication List    STOP taking these medications   amLODipine 5 MG tablet Commonly known as: NORVASC   aspirin 81 MG chewable tablet   atorvastatin 10 MG tablet Commonly known as: LIPITOR   bisacodyl 10 MG suppository Commonly known as: DULCOLAX   ferrous sulfate 325 (65 FE) MG tablet     TAKE these medications   acetaminophen 325 MG tablet Commonly known as: TYLENOL Take 1-2 tablets (325-650 mg total) by mouth every 6 (six) hours as needed for mild pain (pain score 1-3 or temp > 100.5).   acidophilus Caps capsule Take 1 capsule by mouth daily for 14 days. Start taking on: September 24, 2020   cholestyramine 4 g packet Commonly known as: QUESTRAN Take 1 packet (4 g total) by mouth 2 (two) times daily as needed for up to 14 days  (Diarrhea). Use only if having active diarrhea.   feeding supplement Liqd Take 237 mLs by mouth 2 (two) times daily between meals.   nitroGLYCERIN 0.4 MG SL tablet Commonly known as: NITROSTAT Place 0.4 mg under the tongue every 5 (five) minutes as needed for chest pain.   ondansetron 4 MG disintegrating tablet Commonly known as: Zofran ODT Take 1 tablet (4 mg total) by mouth every 8 (eight) hours as needed for nausea or vomiting.   vancomycin 125 MG capsule Commonly known as: VANCOCIN Take 1 capsule (125 mg total) by mouth in the morning, at noon, in the evening, and at bedtime for 14 days.       No Known Allergies  Consultations:  Palliative care   Procedures/Studies: DG Abd 1 View  Result Date: 09/20/2020 CLINICAL DATA:  Abdominal pain EXAM: ABDOMEN - 1 VIEW COMPARISON:  05/14/2016 FINDINGS: No bowel dilatation to suggest obstruction. No evidence of pneumoperitoneum, portal venous gas or pneumatosis. No pathologic calcifications along the expected course of the ureters. Lumbar spine spondylosis.  Mild osteoarthritis of the right hip. IMPRESSION: 1. No acute abdominal abnormality. Electronically Signed   By: Kathreen Devoid   On: 09/20/2020 13:10   CT Angio Chest PE W and/or Wo Contrast  Result Date: 09/12/2020 CLINICAL DATA:  Shortness of breath EXAM: CT ANGIOGRAPHY CHEST WITH CONTRAST TECHNIQUE: Multidetector CT imaging of the chest was performed using the standard protocol during bolus administration of intravenous contrast. Multiplanar CT image reconstructions and MIPs were obtained to evaluate the vascular anatomy. CONTRAST:  27mL OMNIPAQUE IOHEXOL  350 MG/ML SOLN COMPARISON:  Chest x-ray 09/12/2020, CT chest 11/26/2008, CT angiography 06/21/2019 FINDINGS: Cardiovascular: Satisfactory opacification of the pulmonary arteries to the segmental level. Small nonocclusive thrombus within left lower lobe distal segmental/subsegmental vessel. No other discrete filling defects are  visualized. Moderate aortic atherosclerosis without dissection. Coronary vascular calcification. Normal cardiac size. Trace pericardial effusion. 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 Stable mild aneurysmal dilatation of the proximal infrarenal abdominal aorta up to 3.1 cm, penetrating aortic ulcer or ulcerated plaque at this level measuring approximately 1 cm. 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 Mediastinum/Nodes: Midline trachea. No thyroid mass. No suspicious adenopathy. Esophagus within normal limits. Lungs/Pleura: Small bilateral pleural effusions with dependent atelectasis. Mild subpleural fibrosis. Scarring and fibrosis at the apices. Upper Abdomen: No acute abnormality. Small gallstones. Small amount of free fluid within the upper abdomen. Musculoskeletal: Chronic sternal fracture Review of the MIP images confirms the above findings. IMPRESSION: 1. Small nonocclusive thrombus within the left lower lobe distal segmental/subsegmental vessels. 2. Small bilateral pleural effusions with dependent atelectasis. 3. 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 Stable mild aneurysmal dilatation of the proximal infrarenal abdominal aorta up to 3.1 cm, penetrating aortic ulcer or ulcerated plaque at this level measuring approximately 1 cm. 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 Recommend follow-up ultrasound every 3 years. This recommendation follows ACR consensus guidelines: White Paper of the ACR Incidental Findings Committee II on Vascular Findings. J Am Coll Radiol 2013; 10:789-794. 4. Gallstones. 5. Small amount of free fluid within the upper abdomen. 6. Aortic atherosclerosis. Critical Value/emergent results were called by telephone at the time of interpretation on 09/12/2020 at 5:36 pm to provider PHILLIP STAFFORD , who verbally acknowledged these results. Aortic Atherosclerosis (ICD10-I70.0). Electronically Signed    By: Donavan Foil M.D.   On: 09/12/2020 17:37   CT ABDOMEN PELVIS W CONTRAST  Result Date: 09/12/2020 CLINICAL DATA:  Abdominal distension EXAM: CT ABDOMEN AND PELVIS WITH CONTRAST TECHNIQUE: Multidetector CT imaging of the abdomen and pelvis was performed using the standard protocol following bolus administration of intravenous contrast. CONTRAST:  33mL OMNIPAQUE IOHEXOL 350 MG/ML SOLN COMPARISON:  CT 06/20/2020 FINDINGS: Lower chest: Lung bases demonstrate small bilateral pleural effusions and dependent atelectasis. Coronary vascular calcification. Normal cardiac size. Hepatobiliary: Small gallstones. No biliary dilatation. No focal hepatic abnormality Pancreas: Unremarkable. No pancreatic ductal dilatation or surrounding inflammatory changes. Spleen: Peripheral slightly wedge-shaped hypodensities within the posterior spleen are indeterminate for small infarcts. Adrenals/Urinary Tract: Adrenal glands are normal. 6.6 cm cyst exophytic to the upper left kidney. Small cyst lower pole left kidney. No hydronephrosis. The bladder is normal Stomach/Bowel: The stomach is nonenlarged. No dilated small bowel. Marked diffuse wall thickening of the entire colon with mucosal enhancement. Grossly negative appendix. Diverticular disease of the sigmoid colon. Vascular/Lymphatic: Extensive aortic atherosclerosis. Penetrating aortic ulcers or ulcerated plaque at the supra and infrarenal abdominal aorta described on chest CT. Mild aneurysmal dilatation of the infrarenal abdominal aorta measuring up to 3.1 cm. 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 No suspicious nodes Reproductive: Prostate is unremarkable. Other: No free air. Small quantity of free fluid within the abdomen and pelvis. Generalized subcutaneous edema consistent with anasarca Musculoskeletal: Status post right femoral intramedullary rodding for comminuted intertrochanteric fracture. IMPRESSION: 1. Marked diffuse wall thickening of the entire colon with  mucosal enhancement, consistent with pancolitis of infectious, inflammatory, or ischemic etiology. This likely corresponds to the history of C difficile  colitis. There is no perforation or abscess. 2. Small quantity of free fluid within the abdomen and pelvis. Generalized subcutaneous edema consistent with anasarca. 3. Peripheral slightly wedge-shaped hypodensities within the posterior spleen, indeterminate for small infarcts. 4. Extensive aortic atherosclerosis with penetrating aortic ulcers or ulcerated plaque at the supra and infrarenal abdominal aorta without significant change since 06/20/2020. Mild aneurysmal dilatation of the infrarenal abdominal aorta measuring up to 3.1 cm. Recommend follow-up ultrasound every 3 years. This recommendation follows ACR consensus guidelines: White Paper of the ACR Incidental Findings Committee II on Vascular Findings. J Am Coll Radiol 2013; 10:789-794. 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 5. Small bilateral pleural effusions and dependent atelectasis. 6. Cholelithiasis. 7. Status post right femoral intramedullary rodding for comminuted intertrochanteric fracture. Critical Value/emergent results were called by telephone at the time of interpretation on 09/12/2020 at 5:36 pm to provider PHILLIP STAFFORD , who verbally acknowledged these results. Aortic Atherosclerosis (ICD10-I70.0). Electronically Signed   By: Donavan Foil M.D.   On: 09/12/2020 17:36   US Venous Img Lower Bilateral (DVT)  Result Date: 09/13/2020 CLINICAL DATA:  87 year old male with bilateral lower extremity leg swelling, pulmonary embolism. EXAM: BILATERAL LOWER EXTREMITY VENOUS DOPPLER ULTRASOUND TECHNIQUE: Gray-scale sonography with graded compression, as well as color Doppler and duplex ultrasound were performed to evaluate the lower extremity deep venous systems from the level of the common femoral vein and including the common femoral, femoral, profunda femoral, popliteal and calf veins  including the posterior tibial, peroneal and gastrocnemius veins when visible. The superficial great saphenous vein was also interrogated. Spectral Doppler was utilized to evaluate flow at rest and with distal augmentation maneuvers in the common femoral, femoral and popliteal veins. COMPARISON:  None. FINDINGS: RIGHT LOWER EXTREMITY Common Femoral Vein: No evidence of thrombus. Normal compressibility, respiratory phasicity and response to augmentation. Saphenofemoral Junction: No evidence of thrombus. Normal compressibility and flow on color Doppler imaging. Profunda Femoral Vein: No evidence of thrombus. Normal compressibility and flow on color Doppler imaging. Femoral Vein: No evidence of thrombus. Normal compressibility, respiratory phasicity and response to augmentation. Popliteal Vein: No evidence of thrombus. Normal compressibility, respiratory phasicity and response to augmentation. Calf Veins: The peroneal vein is not well visualized. No evidence of thrombus. Normal compressibility and flow on color Doppler imaging. Other Findings:  None. LEFT LOWER EXTREMITY Common Femoral Vein: No evidence of thrombus. Normal compressibility, respiratory phasicity and response to augmentation. Saphenofemoral Junction: No evidence of thrombus. Normal compressibility and flow on color Doppler imaging. Profunda Femoral Vein: No evidence of thrombus. Normal compressibility and flow on color Doppler imaging. Femoral Vein: No evidence of thrombus. Normal compressibility, respiratory phasicity and response to augmentation. Popliteal Vein: No evidence of thrombus. Normal compressibility, respiratory phasicity and response to augmentation. Calf Veins: The peroneal vein is not well visualized. No evidence of thrombus. Normal compressibility and flow on color Doppler imaging. Other Findings:  None. IMPRESSION: No evidence of bilateral lower extremity deep vein thrombosis. Ruthann Cancer, MD [facility] Electronically Signed   By: Ruthann Cancer MD   On: 09/13/2020 11:17   DG Chest Portable 1 View  Result Date: 09/12/2020 CLINICAL DATA:  Hypoxia EXAM: PORTABLE CHEST 1 VIEW COMPARISON:  07/20/2020 FINDINGS: Trace right and small left pleural effusion. Consolidation at the left lung base. Normal cardiomediastinal silhouette with aortic atherosclerosis. No pneumothorax. IMPRESSION: Trace right and small left pleural effusion with left basilar atelectasis or pneumonia. Electronically Signed   By: Donavan Foil  M.D.   On: 09/12/2020 16:05    (Echo, Carotid, EGD, Colonoscopy, ERCP)    Subjective: Patient seen and examined on the day of discharge.  Sitting up in bed.  Mentating clearly.  Again expresses desire to go home.  Discharge Exam: Vitals:   09/23/20 1020 09/23/20 1208  BP:  113/67  Pulse: 100 97  Resp:  15  Temp:  98.2 F (36.8 C)  SpO2: 94% 96%   Vitals:   09/23/20 0450 09/23/20 0847 09/23/20 1020 09/23/20 1208  BP: 124/60 110/79  113/67  Pulse: 99 97 100 97  Resp: 16 14  15   Temp: 97.7 F (36.5 C) 97.8 F (36.6 C)  98.2 F (36.8 C)  TempSrc: Oral     SpO2: 95% (!) 80% 94% 96%  Weight: 57.2 kg     Height:        General: Alert, appears weak and frail Cardiovascular: RRR, S1/S2 +, no rubs, no gallops Respiratory: CTA bilaterally, no wheezing, no rhonchi Abdominal: Soft, nondistended, mild tender to palpation, hyperactive bowel sounds Extremities: no edema, no cyanosis    The results of significant diagnostics from this hospitalization (including imaging, microbiology, ancillary and laboratory) are listed below for reference.     Microbiology: No results found for this or any previous visit (from the past 240 hour(s)).   Labs: BNP (last 3 results) No results for input(s): BNP in the last 8760 hours. Basic Metabolic Panel: Recent Labs  Lab 09/18/20 0622 09/19/20 0727 09/20/20 0426 09/21/20 0420 09/22/20 0437 09/23/20 0538   NA 130* 130* 132* 134* 130* 133*  K 4.5 4.0 4.2 4.3 4.3 3.8  CL 104 104 107 109 107 109  CO2 20* 19* 20* 18* 19* 20*  GLUCOSE 85 86 74 106* 72 76  BUN 20 21 20 20 21 20   CREATININE 0.95 0.90 0.86 0.86 0.92 0.85  CALCIUM 7.4* 7.0* 7.1* 7.4* 7.2* 7.2*  MG 2.0 1.8 1.8 1.9 1.9 2.0  PHOS 2.8  --   --   --   --   --    Liver Function Tests: No results for input(s): AST, ALT, ALKPHOS, BILITOT, PROT, ALBUMIN in the last 168 hours. No results for input(s): LIPASE, AMYLASE in the last 168 hours. No results for input(s): AMMONIA in the last 168 hours. CBC: Recent Labs  Lab 09/19/20 0727 09/20/20 0426 09/21/20 0420 09/22/20 0437 09/23/20 0538  WBC 17.9* 19.8* 22.2* 16.4* 13.1*  NEUTROABS 8.6* 7.4 8.8* 7.0 5.5  HGB 9.6* 9.3* 9.3* 8.7* 9.4*  HCT 30.0* 31.1* 30.0* 27.0* 28.8*  MCV 90.1 95.4 91.7 88.8 88.6  PLT 125* 131* 150 144* 149*   Cardiac Enzymes: No results for input(s): CKTOTAL, CKMB, CKMBINDEX, TROPONINI in the last 168 hours. BNP: Invalid input(s): POCBNP CBG: No results for input(s): GLUCAP in the last 168 hours. D-Dimer No results for input(s): DDIMER in the last 72 hours. Hgb A1c No results for input(s): HGBA1C in the last 72 hours. Lipid Profile No results for input(s): CHOL, HDL, LDLCALC, TRIG, CHOLHDL, LDLDIRECT in the last 72 hours. Thyroid function studies No results for input(s): TSH, T4TOTAL, T3FREE, THYROIDAB in the last 72 hours.  Invalid input(s): FREET3 Anemia work up No results for input(s): VITAMINB12, FOLATE, FERRITIN, TIBC, IRON, RETICCTPCT in the last 72 hours. Urinalysis    Component Value Date/Time   COLORURINE AMBER (A) 09/21/2020 0600   APPEARANCEUR HAZY (A) 09/21/2020 0600   LABSPEC 1.019 09/21/2020 0600   PHURINE 5.0 09/21/2020 0600   GLUCOSEU NEGATIVE 09/21/2020  0600   HGBUR NEGATIVE 09/21/2020 0600   BILIRUBINUR NEGATIVE 09/21/2020 0600   KETONESUR NEGATIVE 09/21/2020 0600   PROTEINUR NEGATIVE 09/21/2020 0600   NITRITE NEGATIVE  09/21/2020 0600   LEUKOCYTESUR NEGATIVE 09/21/2020 0600   Sepsis Labs Invalid input(s): PROCALCITONIN,  WBC,  LACTICIDVEN Microbiology No results found for this or any previous visit (from the past 240 hour(s)).   Time coordinating discharge: Over 30 minutes  SIGNED:   Sidney Ace, MD  [facility] 09/23/2020, 3:20 PM Pager   If 7PM-7AM, please contact night-coverage

## 2020-09-23 NOTE — Progress Notes (Signed)
Palliative: [name] is lying quietly in bed.  He appears chronically ill and quite frail.  He will make an mostly keep eye contact, is able to make his basic needs known.  Present today at bedside is his daughter, Dustin Howell.  We talked about Dustin Howell's acute illness, including, but not limited to hip repair, infectious diarrhea, the treatment plan, goals and options.  We talked in detail about the difference between outpatient palliative and hospice services.  Later in our conversation Dustin Howell shares that her mother was at the [facility] hosp[facility] on [facility]. for about 10 days.   Dustin Howell is very torn about making choices for Dustin Howell.  Although he states he wants to go home, she feels pressured to find the right choice.  She tells me that she is separated from her husband, and that her brother can help physically, but will often say "what ever you decide".  As we are talking, her cousin, Dustin Howell nephew arrives.  When asked by Dustin Howell, he quickly and readily states that he feels that hospice is the best choice.  We talked about what is and is not provided with hospice care.  I encouraged Dustin Howell to ask for a list of private pay sitters.  We also talked about residential hospice if/when needed.  Dustin Howell tells me that he wants to go home with the benefits of hospice, do not rehospitalize even if it means he were to pass away.  I reassure him that we will respect his wishes and care for him.  Hospice choice offered, family chooses [facility].  Conference with attending, bedside nursing staff, transition of care team, local hospice representative related to patient condition, needs, goals of care, disposition choices.   Plan: Continue to treat the treatable but no CPR or intubation.  Home with the benefits of "treat the treatable" hospice care with [facility] care. Prognosis: Weeks to months.  Weeks if diarrhea continues/worsens. ACP updated.  61 minutes Dustin Axe, NP Palliative medicine team Team  phone [phone] Greater than 50% of this time was spent counseling and coordinating care related to the above assessment and plan.

## 2020-09-23 NOTE — TOC Progression Note (Signed)
Transition of Care [facility]) - Progression Note    Patient Details  Name: Dustin Howell MRN: [id number] Date of Birth: [birth year]  Transition of Care [facility]) CM/SW Contact  Shelbie Hutching, RN Phone Number: 09/23/2020, 11:13 AM  Clinical Narrative:    Patient and daughter have decided that they would like to go home with hospice.  Penny with [facility] has accepted referral.  Patient needs a hospital bed and wheelchair.  Kieth Brightly will see if equipment can be delivered today.  Daughter plans to transport patient home.    Expected Discharge Plan: [place] Barriers to Discharge: Continued Medical Work up  Expected Discharge Plan and Services Expected Discharge Plan: [place] In-house Referral: Hospice / Palliative Care Discharge Planning Services: CM Consult Post Acute Care Choice: [place] arrangements for the past 2 months: Single Family Home                 DME Arranged: N/A DME Agency: NA       HH Arranged: RN,PT,OT [place] Agency: [facility] [place] Date Magazine: 09/18/20 Time [place]: [id number] Representative spoke with at [place]: [place] ([place]) Interventions    Readmission Risk Interventions No flowsheet data found.

## 2021-08-08 ENCOUNTER — Telehealth: Payer: Self-pay | Admitting: Student

## 2021-08-08 NOTE — Telephone Encounter (Signed)
Spoke with patient's daughter Joseph Art, regarding the Palliative referral/services and all questions were answered and she was in agreeement with beginning services with [place].  I have scheduled a [place] for 08/12/21 @ 1 PM

## 2021-08-12 ENCOUNTER — Other Ambulatory Visit: Payer: Medicare Other | Admitting: Student

## 2021-08-12 ENCOUNTER — Other Ambulatory Visit: Payer: Self-pay

## 2021-08-12 DIAGNOSIS — K5900 Constipation, unspecified: Secondary | ICD-10-CM

## 2021-08-12 DIAGNOSIS — Z515 Encounter for palliative care: Secondary | ICD-10-CM

## 2021-08-12 DIAGNOSIS — I110 Hypertensive heart disease with heart failure: Secondary | ICD-10-CM

## 2021-08-12 DIAGNOSIS — E46 Unspecified protein-calorie malnutrition: Secondary | ICD-10-CM

## 2021-08-12 NOTE — Progress Notes (Signed)
[place] Consult Note Telephone: [phone]  Fax: [phone]   Date of encounter: 08/12/21 3:07 PM PATIENT NAME: Dustin Howell [address] Dust[address] [place] [address]   [phone] (home)  DOB: 02/12/1925 MRN: [id number] PRIMARY CARE PROVIDER:    [name], MD,  [address] [address] [phone]  REFERRING PROVIDER:   [name], MD [facility] [address] [place],  [place] [address] [phone]  RESPONSIBLE PARTY:    Contact Information     Name Relation Home Work Mobile   [place] Daughter [place] Son [phone]          Due to the COVID-19 crisis, this home visit was done via telephone due to the patient's inability to connect via an audiovisual connection or their refusal to have an in-person visit. This connection was agreed to by the patient or PROXY. Verified that I am speaking with the correct person using two identifiers.                                    ASSESSMENT AND PLAN / RECOMMENDATIONS:   Advance Care Planning/Goals of Care: Goals include to maximize quality of life and symptom management. Patient/health care surrogate gave his/her permission to discuss.Our advance care planning conversation included a discussion about:    The value and importance of advance care planning  Experiences with loved ones who have been seriously ill or have died  Exploration of personal, cultural or spiritual beliefs that might influence medical decisions  Exploration of goals of care in the event of a sudden injury or illness  CODE STATUS: DNR  Education provided on palliative medicine versus Hospice services. Ongoing assessment for changes in decline. Will refer patient back to Hospice services when he meets eligibility requirements. Ongoing discussion regarding code status. Patient is a DNR but daughter states that they  would like to discuss this further since he has improved.  Symptom Management/Plan:  Hypertensive heart and kidney disease, CHF-monitor for symptoms such as shortness of breath, chest pain, edema. Patient not currently taking any medications.   Protein calorie malnutrition-patient's appetite has improved; he is eating 100% of meals. Last weight in 06/2021 was 110 pounds. Will monitor for changes/decline in appetite. Routine weights.   Constipation-continue stool softener PRN. Adequate fiber encouraged.   Follow up Palliative Care Visit: Palliative care will continue to follow for complex medical decision making, advance care planning, and clarification of goals. Return in 4 weeks or prn.   This visit was coded based on medical decision making (MDM).  PPS: 50%  HOSPICE ELIGIBILITY/DIAGNOSIS: TBD  Chief Complaint: Palliative Medicine initial consult.   HISTORY OF PRESENT ILLNESS:  Dustin Howell is a 88 y.o. year old male  with CAD s/p DES, CHF, hypertensive kidney disease stage 3b, lymphoproliferative disorder, colitis, history of lower GI bleed, recurrent c dificile, protein calorie malnutrition, iron deficiency anemia, GERD,  OA, history of PE, lower GI bleed.    Patient recently discharged from Hospice services on 07/30/2021 due to stability. Daughter Dustin Howell reports patient is doing well. Patient denies any needs at this time. He denies any pain, shortness of breath, nausea. Occasional constipation. Good appetite; eating 100% of meals at this time. He does require some assistance with ADL's such as bathing and dressing. He ambulates with cane in the house  and two canes when outside. No falls. He is sleeping well. No recent medication changes. No Ed visits or hospitalizations.  History obtained from review of EMR, discussion with primary team, and interview with family, facility staff/caregiver and/or Dustin Howell.  I reviewed available labs, medications, imaging, studies and related documents from  the EMR.  Records reviewed and summarized above.   ROS General: NAD EYES: denies vision changes ENMT: denies dysphagia Cardiovascular: denies chest pain, denies DOE Pulmonary: denies cough, denies increased SOB Abdomen: endorses good appetite, occasional constipation, endorses continence of bowel GU: denies dysuria MSK:  denies increased weakness,  no falls reported Skin: denies rashes or wounds Neurological: denies pain, denies insomnia Psych: Endorses positive mood Heme/lymph/immuno: denies bruises, abnormal bleeding  Physical Exam:  PE deferred due to this being a Tele health visit.    CURRENT PROBLEM LIST:  Patient Active Problem List   Diagnosis Date Noted   Severe sepsis ([place]) 09/12/2020   Recurrent colitis due to Clostridium difficile 09/12/2020   AKI (acute kidney injury) ([place]) 09/12/2020   Hyponatremia 09/12/2020   Hyperkalemia 09/12/2020   Protein-calorie malnutrition, severe 53/97/6734   Acute metabolic encephalopathy    Iron deficiency anemia due to chronic blood loss    Lymphoproliferative disorder (HCC)    Thrombocytopenia (HCC)    Essential hypertension    Hyperlipidemia    Scalp laceration    Stage 2 chronic kidney disease    Closed fracture of right hip requiring operative repair ([place]) 07/20/2020   Abnormal CT scan, colon    Lower GI bleed 06/20/2020   CAD (coronary artery disease) 04/27/2017   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[place]) 04/27/2017   Chest pain on exertion 04/21/2017   Abnormal CT of the abdomen    Acute encephalopathy    Hypokalemia    Ileus (HCC)    Abdominal pain    Acute lower GI bleeding 05/11/2016   Gastrointestinal hemorrhage 05/11/2016   Acute blood loss anemia    Arterial hypotension    Acute pain of left knee    Slurred speech    PAST MEDICAL HISTORY:  Active Ambulatory Problems    Diagnosis Date Noted   Acute lower GI bleeding 05/11/2016   Gastrointestinal hemorrhage 05/11/2016   Acute blood loss anemia    Arterial  hypotension    Acute pain of left knee    Slurred speech    Abdominal pain    Ileus (HCC)    Acute encephalopathy    Hypokalemia    Abnormal CT of the abdomen    Chest pain on exertion 04/21/2017   CAD (coronary artery disease) 04/27/2017   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[place]) 04/27/2017   Lower GI bleed 06/20/2020   Abnormal CT scan, colon    Closed fracture of right hip requiring operative repair ([place]) 07/20/2020   Iron deficiency anemia due to chronic blood loss    Lymphoproliferative disorder (HCC)    Thrombocytopenia (HCC)    Essential hypertension    Hyperlipidemia    Scalp laceration    Stage 2 chronic kidney disease    Acute metabolic encephalopathy    Protein-calorie malnutrition, severe 07/22/2020   Severe sepsis ([place]) 09/12/2020   Recurrent colitis due to Clostridium difficile 09/12/2020   AKI (acute kidney injury) ([place]) 09/12/2020   Hyponatremia 09/12/2020   Hyperkalemia 09/12/2020   Resolved Ambulatory Problems    Diagnosis Date Noted   No Resolved Ambulatory Problems   Past Medical History:  Diagnosis Date   GERD (gastroesophageal reflux disease)  GI bleed 04/2016   HOH (hard of hearing)    Iron deficiency anemia 03/2018   SOCIAL HX:  Social History   Tobacco Use   Smoking status: Former    Packs/day: 0.50    Types: Cigarettes    Quit date: 1957    Years since quitting: 66.1   Smokeless tobacco: Current    Types: Snuff  Substance Use Topics   Alcohol use: Yes    Comment: every evening with supper   FAMILY HX:  Family History  Problem Relation Age of Onset   Diabetes Mother    Stroke Father    Ovarian cancer Sister       ALLERGIES: No Known Allergies   PERTINENT MEDICATIONS:  Outpatient Encounter Medications as of 08/12/2021  Medication Sig   acetaminophen (TYLENOL) 325 MG tablet Take 1-2 tablets (325-650 mg total) by mouth every 6 (six) hours as needed for mild pain (pain score 1-3 or temp > 100.5).   cholestyramine (QUESTRAN) 4 g  packet Take 1 packet (4 g total) by mouth 2 (two) times daily as needed for up to 14 days (Diarrhea). Use only if having active diarrhea.   feeding supplement (ENSURE ENLIVE / ENSURE PLUS) LIQD Take 237 mLs by mouth 2 (two) times daily between meals.   nitroGLYCERIN (NITROSTAT) 0.4 MG SL tablet Place 0.4 mg under the tongue every 5 (five) minutes as needed for chest pain.   ondansetron (ZOFRAN ODT) 4 MG disintegrating tablet Take 1 tablet (4 mg total) by mouth every 8 (eight) hours as needed for nausea or vomiting.   No facility-administered encounter medications on file as of 08/12/2021.   Thank you for the opportunity to participate in the care of Dustin Howell.  The palliative care team will continue to follow. Please call our office at [phone] if we can be of additional assistance.   Ezekiel Slocumb, NP   COVID-19 PATIENT SCREENING TOOL Asked and negative response unless otherwise noted:  Have you had symptoms of covid, tested positive or been in contact with someone with symptoms/positive test in the past 5-10 days? No

## 2021-09-16 ENCOUNTER — Other Ambulatory Visit: Payer: Self-pay

## 2021-09-16 ENCOUNTER — Other Ambulatory Visit: Payer: Medicare Other | Admitting: Student

## 2021-09-16 DIAGNOSIS — I110 Hypertensive heart disease with heart failure: Secondary | ICD-10-CM

## 2021-09-16 DIAGNOSIS — Z515 Encounter for palliative care: Secondary | ICD-10-CM

## 2021-09-16 DIAGNOSIS — E43 Unspecified severe protein-calorie malnutrition: Secondary | ICD-10-CM

## 2021-09-16 DIAGNOSIS — R21 Rash and other nonspecific skin eruption: Secondary | ICD-10-CM

## 2021-09-16 NOTE — Progress Notes (Signed)
? ? ?Manufacturing engineer ?Community Palliative Care Consult Note ?Telephone: [phone]  ?Fax: [phone]  ? ? ?Date of encounter: 09/16/21 ?12:03 PM ?PATIENT NAME: Dustin Howell ?[address] ?[place] [address]   ?[phone] (home)  ?DOB: [birth year] ?MRN: [id number] ?PRIMARY CARE PROVIDER:    ?[name], MD,  ?[place] ?[place] [address] ?[phone] ? ?REFERRING PROVIDER:   ?[name], MD ?[place] ?[facility] ?[place],  [place] [address] ?[phone] ? ?RESPONSIBLE PARTY:    ?Contact Information   ? ? Name Relation Home Work Mobile  ? [name] Daughter [phone]    ? Carla, Whilden Son [phone]    ? ?  ? ? ? ?I met face to face with patient and family in the home. Palliative Care was asked to follow this patient by consultation request of  [name], MD to address advance care planning and complex medical decision making. This is a follow up visit. ? ?                                 ASSESSMENT AND PLAN / RECOMMENDATIONS:  ? ?Advance Care Planning/Goals of Care: Goals include to maximize quality of life and symptom management. Patient/health care surrogate gave his/her permission to discuss. ? ?CODE STATUS: DNR ? ?Education provided on palliative medicine versus Hospice services. Ongoing assessment for changes in decline. Will refer patient back to Hospice services when he meets eligibility requirements. ? ?Symptom Management/Plan: ? ?Hypertensive heart and kidney disease, CHF-monitor for symptoms such as shortness of breath, chest pain, edema. Patient not currently taking any medications.  ? ?Protein calorie malnutrition-patient's appetite has improved; he is eating 100% of meals. ? ?Rash-patient with recurrent itchy, rash to left chest, abdomen. Appears to be contact dermatitis. Uses triamcinolone PRN, aloe vesta PRN. No new agents; encourage rinsing clothes twice. Will monitor for worsening.  ? ?Follow  up Palliative Care Visit: Palliative care will continue to follow for complex medical decision making, advance care planning, and clarification of goals. Return 8-10 weeks or prn. ? ? ?This visit was coded based on medical decision making (MDM). ? ?PPS: 50% ? ?HOSPICE ELIGIBILITY/DIAGNOSIS: TBD ? ?Chief Complaint: Palliative Medicine follow up visit.  ? ?HISTORY OF PRESENT ILLNESS:  Dustin Howell is a 88 y.o. year old male  with  CAD s/p DES, CHF, hypertensive kidney disease stage 3b, lymphoproliferative disorder, colitis, history of lower GI bleed, recurrent c dificile, protein calorie malnutrition, iron deficiency anemia, GERD,  OA, history of PE, lower GI bleed. ? ?Patient resides at home. Denies pain, discomfort. Occasional pain to left arm; takes acetaminophen with relief. Endorses a good appetite. Sleeping well; takes tylenol PM PRN. No falls; uses cane for ambulation. Does endorse a recurrent itchy, rash to his left chest wall, abdomen over the years. A 10-point ROS is negative, except for the pertinent positives and negatives detailed per the HPI.  ? ?History obtained from review of EMR, discussion with primary team, and interview with family, facility staff/caregiver and/or Dustin Howell.  ?I reviewed available labs, medications, imaging, studies and related documents from the EMR.  Records reviewed and summarized above.  ? ?Physical Exam: ?Pulse 82, resp 16, b/p 118/62 , sats 97% on room air ?Constitutional: NAD ?General: frail appearing, thin  ?EYES: anicteric sclera, lids intact, no discharge  ?ENMT: intact hearing, oral mucous membranes moist, dentition intact ?CV: S1S2, RRR, no LE  edema ?Pulmonary: LCTA, no increased work of breathing, no cough, room air ?Abdomen: normo-active BS + 4 quadrants, soft and non tender, no ascites ?GU: deferred ?MSK: moves all extremities, ambulatory ?Skin: warm and dry, no rashes or wounds on visible skin ?Neuro:  no generalized weakness,  no cognitive impairment ?Psych:  non-anxious affect, pleasant, A and O x 3 ?Hem/lymph/immuno: no widespread bruising ? ? ?Thank you for the opportunity to participate in the care of Dustin Howell.  The palliative care team will continue to follow. Please call our office at [phone] if we can be of additional assistance.  ? ?Ezekiel Slocumb, NP  ? ?COVID-19 PATIENT SCREENING TOOL ?Asked and negative response unless otherwise noted:  ? ?Have you had symptoms of covid, tested positive or been in contact with someone with symptoms/positive test in the past 5-10 days? No ? ?

## 2021-11-24 ENCOUNTER — Other Ambulatory Visit: Payer: Medicare Other | Admitting: Student

## 2021-11-24 DIAGNOSIS — R52 Pain, unspecified: Secondary | ICD-10-CM

## 2021-11-24 DIAGNOSIS — E46 Unspecified protein-calorie malnutrition: Secondary | ICD-10-CM

## 2021-11-24 DIAGNOSIS — I110 Hypertensive heart disease with heart failure: Secondary | ICD-10-CM

## 2021-11-24 DIAGNOSIS — K5900 Constipation, unspecified: Secondary | ICD-10-CM

## 2021-11-24 NOTE — Progress Notes (Signed)
? ? ?Manufacturing engineer ?Community Palliative Care Consult Note ?Telephone: [phone]  ?Fax: [phone]  ? ? ?Date of encounter: 11/24/21 ?1:18 PM ?PATIENT NAME: [place] ?[address] ?[place] [address]   ?[phone] (home)  ?DOB: 12-27-1924 ?MRN: [id number] ?PRIMARY CARE PROVIDER:    ?[name], MD,  ?[place] ?[place] [address] ?[phone] ? ?REFERRING PROVIDER:   ?[name], MD ?[place] ?[facility] ?[place],  [place] [address] ?[phone] ? ?RESPONSIBLE PARTY:    ?Contact Information   ? ? Name Relation Home Work Mobile  ? [name] Daughter [phone]    ? Muhamed, Luecke Son [phone]    ? ?  ? ? ? ?I met face to face with patient and family in the home. Palliative Care was asked to follow this patient by consultation request of  [name], MD to address advance care planning and complex medical decision making. This is a follow up visit. ? ?                                 ASSESSMENT AND PLAN / RECOMMENDATIONS:  ? ?Advance Care Planning/Goals of Care: Goals include to maximize quality of life and symptom management. Patient/health care surrogate gave his/her permission to discuss. ?Our advance care planning conversation included a discussion about:    ?The value and importance of advance care planning  ?Experiences with loved ones who have been seriously ill or have died  ?Exploration of personal, cultural or spiritual beliefs that might influence medical decisions  ?CODE STATUS: DNR ? ?Palliative will provide ongoing support, symptom management. Will refer back to hospice for evaluation should patient decline and meet criteria.  ? ?Symptom Management/Plan: ? ?Hypertensive heart and kidney disease, CHF-monitor for symptoms such as shortness of breath, chest pain. He does endorse occasional mild edema and leg pain. Patient not currently taking any medications. Encourage patient to elevate legs,  start wearing compression socks.  ? ?Protein calorie malnutrition-continues with good appetite; no weight loss reported.  ? ?Pain-encourage acetaminophen PRN.  ? ?Constipation-encourage adequate fluid intake, add fiber to diet. Continue Dulcolax, recommend every other day.  ? ? ?Follow up Palliative Care Visit: Palliative care will continue to follow for complex medical decision making, advance care planning, and clarification of goals. Return in 8 weeks or prn. ? ? ?This visit was coded based on medical decision making (MDM). ? ?PPS: 50% ? ?HOSPICE ELIGIBILITY/DIAGNOSIS: TBD ? ?Chief Complaint: Palliative Medicine follow up visit.  ? ?HISTORY OF PRESENT ILLNESS:  Alwin P Dario is a 88 y.o. year old male  with CAD s/p DES, CHF, hypertensive kidney disease stage 3b, lymphoproliferative disorder, colitis, history of lower GI bleed, recurrent c dificile, protein calorie malnutrition, iron deficiency anemia, GERD,  OA, history of PE, lower GI bleed.  ? ?Patient reports doing well. He denies pain, shortness of breath, chest pain, nausea. He does endorse occasional left leg pain after ambulating; also reports mild edema at times. Does report constipation. Appetite is still good. He is sleeping well; taking Tylenol PM PRN. Patient uses two canes for ambulation. No falls reported. ? ?Patient received ambulating outside in his yard; he is checking on his watermelon he has planted. Patient is in good spirits; denies any new concerns at this time. A 10-point ROS is negative, except for the pertinent positives and negatives detailed per the HPI.  ? ?History obtained from  review of EMR, discussion with primary team, and interview with family, facility staff/caregiver and/or Mr. Rusher.  ?I reviewed available labs, medications, imaging, studies and related documents from the EMR.  Records reviewed and summarized above.  ? ?Physical Exam: ?Pulse 88, resp 16, b/p 122/78, sats 96% on room air ?Constitutional: NAD ?General: frail  appearing, thin ?EYES: anicteric sclera, lids intact, no discharge  ?ENMT: intact hearing, oral mucous membranes moist ?CV: S1S2, RRR, no LE edema ?Pulmonary: LCTA, no increased work of breathing, no cough, room air ?Abdomen: normo-active BS + 4 quadrants, soft and non tender, no ascites ?GU: deferred ?MSK: +sarcopenia, moves all extremities, ambulatory ?Skin: warm and dry, no rashes or wounds on visible skin ?Neuro:  no generalized weakness,  no cognitive impairment ?Psych: non-anxious affect, A and O x 3, pleasant ?Hem/lymph/immuno: no widespread bruising ? ? ?Thank you for the opportunity to participate in the care of Mr. Eisenhour.  The palliative care team will continue to follow. Please call our office at [phone] if we can be of additional assistance.  ? ?Ezekiel Slocumb, NP  ? ?COVID-19 PATIENT SCREENING TOOL ?Asked and negative response unless otherwise noted:  ? ?Have you had symptoms of covid, tested positive or been in contact with someone with symptoms/positive test in the past 5-10 days? No ? ?

## 2022-01-06 ENCOUNTER — Telehealth: Payer: Self-pay | Admitting: Student

## 2022-01-07 ENCOUNTER — Other Ambulatory Visit: Payer: Medicare Other

## 2022-01-07 VITALS — BP 122/60 | HR 93 | Temp 97.7°F

## 2022-01-07 DIAGNOSIS — Z515 Encounter for palliative care: Secondary | ICD-10-CM

## 2022-01-14 NOTE — Progress Notes (Signed)
PATIENT NAME: Dustin Howell DOB: April 19, 1925 MRN: [id number]  PRIMARY CARE PROVIDER: [name], MD  RESPONSIBLE PARTY:  Acct ID - Guarantor Home Phone Work Phone Relationship Acct Type  [id number] JUSTINE, COSSIN [phone]  Self P/F     [address] [place], [place], [place] [address]   Patient with COVID-19 exposure in the last week.  Developed a cough and was started on mucinex by daughter Joseph Art. Daughter out of COVID tests but able to obtain one to test patient.  Patient with increase weakness requiring more assistance from family with mobility.  Today, he is able to get up and ambulate to the bathroom.  Having some left arm weakness which has worsened. Discussed signs of stroke with daughter and patient.  No other symptoms were noted by either.  We discussed an ED visit to evaluate for possible TIA/Stroke but they declined and would like to continue to monitor.   CODE STATUS: DNR ADVANCED DIRECTIVES: No MOST FORM: No PPS: 40%   PHYSICAL EXAM:   VITALS: Today's Vitals   01/07/22 1105  BP: 122/60  Pulse: 93  Temp: 97.7 F (36.5 C)  SpO2: 96%    LUNGS: clear to auscultation  CARDIAC: Cor RRR}  EXTREMITIES: - for edema SKIN: Skin color, texture, turgor normal. No rashes or lesions or mobility and turgor normal  NEURO: positive for gait problems and weakness       Lorenza Burton, RN

## 2022-01-15 ENCOUNTER — Telehealth: Payer: Self-pay

## 2022-01-15 NOTE — Telephone Encounter (Signed)
942 am.  Message received from patient's daughter Joseph Art.  Return call made and she is requesting a follow up visit.  Visit scheduled for tomorrow at 83.

## 2022-01-16 ENCOUNTER — Other Ambulatory Visit: Payer: Medicare Other

## 2022-01-16 VITALS — BP 120/54 | HR 76 | Temp 97.7°F | Resp 20 | Wt 105.2 lb

## 2022-01-16 DIAGNOSIS — Z515 Encounter for palliative care: Secondary | ICD-10-CM

## 2022-01-16 NOTE — Progress Notes (Signed)
PATIENT NAME: Dustin Howell DOB: 1924/12/13 MRN: [id number]  PRIMARY CARE PROVIDER: [name], MD  RESPONSIBLE PARTY:  Acct ID - Guarantor Home Phone Work Phone Relationship Acct Type  [id number] LEVITICUS, HARTON [phone]  Self P/F     [address], [place], [place] [address]    Respiratory:  Lung sounds CTA. No issues with shortness of breath reported by patient or daughter.  No longer taking mucinex as congestion has cleared.   Weight:   Obtained weight today and reading is 105.2 lbs.  Reviewed hospice notes and patient was 105 lbs at discharge in January.  Discussed boost/ensure supplement drinks and patient is only consuming on a prn basis.  Daughter-Renee will obtain more and have patient drink daily.  Portion sizes have decreased over the last couple of weeks.   Weakness:  Patient sustained a fall after my visit last week.  No injuries reported.  Patient has re-gained some strength to his left arm and left lower leg.   Update provided to [facility], NP.  CODE STATUS: DNR ADVANCED DIRECTIVES: No MOST FORM: No PPS: 50%   PHYSICAL EXAM:   VITALS: Today's Vitals   01/16/22 1120  BP: (!) 120/54  Pulse: 76  Resp: 20  Temp: 97.7 F (36.5 C)  SpO2: 97%  Weight: 105 lb 3.2 oz (47.7 kg)  PainSc: 0-No pain    LUNGS: clear to auscultation  CARDIAC: corr rrr SKIN: Skin color, texture, turgor normal. No rashes or lesions or normal  NEURO: positive for gait problems and weakness       Lorenza Burton, RN

## 2022-01-20 ENCOUNTER — Inpatient Hospital Stay (HOSPITAL_COMMUNITY)
Admission: EM | Admit: 2022-01-20 | Discharge: 2022-01-21 | DRG: 378 | Discharge status: Against Medical Advice | Payer: Medicare Other | Attending: Internal Medicine | Admitting: Internal Medicine

## 2022-01-20 ENCOUNTER — Encounter (HOSPITAL_COMMUNITY): Payer: Self-pay

## 2022-01-20 ENCOUNTER — Emergency Department (HOSPITAL_COMMUNITY): Payer: Medicare Other

## 2022-01-20 ENCOUNTER — Other Ambulatory Visit: Payer: Self-pay

## 2022-01-20 DIAGNOSIS — Z66 Do not resuscitate: Secondary | ICD-10-CM | POA: Diagnosis present

## 2022-01-20 DIAGNOSIS — Z79899 Other long term (current) drug therapy: Secondary | ICD-10-CM

## 2022-01-20 DIAGNOSIS — Z833 Family history of diabetes mellitus: Secondary | ICD-10-CM | POA: Diagnosis not present

## 2022-01-20 DIAGNOSIS — K5731 Diverticulosis of large intestine without perforation or abscess with bleeding: Principal | ICD-10-CM | POA: Diagnosis present

## 2022-01-20 DIAGNOSIS — Z8041 Family history of malignant neoplasm of ovary: Secondary | ICD-10-CM | POA: Diagnosis not present

## 2022-01-20 DIAGNOSIS — D62 Acute posthemorrhagic anemia: Secondary | ICD-10-CM | POA: Diagnosis present

## 2022-01-20 DIAGNOSIS — K921 Melena: Secondary | ICD-10-CM | POA: Diagnosis present

## 2022-01-20 DIAGNOSIS — I719 Aortic aneurysm of unspecified site, without rupture: Secondary | ICD-10-CM | POA: Diagnosis present

## 2022-01-20 DIAGNOSIS — I2510 Atherosclerotic heart disease of native coronary artery without angina pectoris: Secondary | ICD-10-CM | POA: Diagnosis present

## 2022-01-20 DIAGNOSIS — E860 Dehydration: Secondary | ICD-10-CM | POA: Diagnosis present

## 2022-01-20 DIAGNOSIS — D479 Neoplasm of uncertain behavior of lymphoid, hematopoietic and related tissue, unspecified: Secondary | ICD-10-CM | POA: Diagnosis not present

## 2022-01-20 DIAGNOSIS — K922 Gastrointestinal hemorrhage, unspecified: Secondary | ICD-10-CM | POA: Diagnosis not present

## 2022-01-20 DIAGNOSIS — N179 Acute kidney failure, unspecified: Secondary | ICD-10-CM | POA: Diagnosis present

## 2022-01-20 DIAGNOSIS — I700 Atherosclerosis of aorta: Secondary | ICD-10-CM | POA: Diagnosis present

## 2022-01-20 DIAGNOSIS — Z823 Family history of stroke: Secondary | ICD-10-CM | POA: Diagnosis not present

## 2022-01-20 DIAGNOSIS — Z5329 Procedure and treatment not carried out because of patient's decision for other reasons: Secondary | ICD-10-CM | POA: Diagnosis present

## 2022-01-20 DIAGNOSIS — K219 Gastro-esophageal reflux disease without esophagitis: Secondary | ICD-10-CM | POA: Diagnosis present

## 2022-01-20 DIAGNOSIS — Z87891 Personal history of nicotine dependence: Secondary | ICD-10-CM

## 2022-01-20 DIAGNOSIS — I7143 Infrarenal abdominal aortic aneurysm, without rupture: Secondary | ICD-10-CM | POA: Diagnosis present

## 2022-01-20 DIAGNOSIS — I771 Stricture of artery: Secondary | ICD-10-CM | POA: Diagnosis present

## 2022-01-20 DIAGNOSIS — Z86711 Personal history of pulmonary embolism: Secondary | ICD-10-CM

## 2022-01-20 DIAGNOSIS — Z955 Presence of coronary angioplasty implant and graft: Secondary | ICD-10-CM | POA: Diagnosis not present

## 2022-01-20 LAB — URINALYSIS, ROUTINE W REFLEX MICROSCOPIC
Bilirubin Urine: NEGATIVE
Glucose, UA: NEGATIVE mg/dL
Hgb urine dipstick: NEGATIVE
Ketones, ur: NEGATIVE mg/dL
Leukocytes,Ua: NEGATIVE
Nitrite: NEGATIVE
Protein, ur: NEGATIVE mg/dL
Specific Gravity, Urine: 1.02 (ref 1.005–1.030)
pH: 5.5 (ref 5.0–8.0)

## 2022-01-20 LAB — COMPREHENSIVE METABOLIC PANEL
ALT: 18 U/L (ref 0–44)
AST: 27 U/L (ref 15–41)
Albumin: 3.5 g/dL (ref 3.5–5.0)
Alkaline Phosphatase: 84 U/L (ref 38–126)
Anion gap: 12 (ref 5–15)
BUN: 35 mg/dL — ABNORMAL HIGH (ref 8–23)
CO2: 22 mmol/L (ref 22–32)
Calcium: 9.2 mg/dL (ref 8.9–10.3)
Chloride: 105 mmol/L (ref 98–111)
Creatinine, Ser: 1.35 mg/dL — ABNORMAL HIGH (ref 0.61–1.24)
GFR, Estimated: 48 mL/min — ABNORMAL LOW (ref >60)
Glucose, Bld: 120 mg/dL — ABNORMAL HIGH (ref 70–99)
Potassium: 4.5 mmol/L (ref 3.5–5.1)
Sodium: 139 mmol/L (ref 135–145)
Total Bilirubin: 0.2 mg/dL — ABNORMAL LOW (ref 0.3–1.2)
Total Protein: 7 g/dL (ref 6.5–8.1)

## 2022-01-20 LAB — CBC WITH DIFFERENTIAL/PLATELET
Abs Immature Granulocytes: 0 10*3/uL (ref 0.00–0.07)
Basophils Absolute: 0 10*3/uL (ref 0.0–0.1)
Basophils Relative: 0 %
Eosinophils Absolute: 0.2 10*3/uL (ref 0.0–0.5)
Eosinophils Relative: 1 %
HCT: 26.3 % — ABNORMAL LOW (ref 39.0–52.0)
Hemoglobin: 8.3 g/dL — ABNORMAL LOW (ref 13.0–17.0)
Lymphocytes Relative: 58 %
Lymphs Abs: 12.9 10*3/uL — ABNORMAL HIGH (ref 0.7–4.0)
MCH: 28.4 pg (ref 26.0–34.0)
MCHC: 31.6 g/dL (ref 30.0–36.0)
MCV: 90.1 fL (ref 80.0–100.0)
Monocytes Absolute: 0.7 10*3/uL (ref 0.1–1.0)
Monocytes Relative: 3 %
Neutro Abs: 8.4 10*3/uL — ABNORMAL HIGH (ref 1.7–7.7)
Neutrophils Relative %: 38 %
Platelets: 236 10*3/uL (ref 150–400)
RBC: 2.92 MIL/uL — ABNORMAL LOW (ref 4.22–5.81)
RDW: 14.8 % (ref 11.5–15.5)
WBC: 22.2 10*3/uL — ABNORMAL HIGH (ref 4.0–10.5)
nRBC: 0 % (ref 0.0–0.2)
nRBC: 0 /100 WBC

## 2022-01-20 LAB — POC OCCULT BLOOD, ED
Fecal Occult Bld: POSITIVE — AB
Fecal Occult Bld: POSITIVE — AB

## 2022-01-20 MED ORDER — PANTOPRAZOLE 80MG IVPB - SIMPLE MED
80.0000 mg | Freq: Once | INTRAVENOUS | Status: AC
  Administered 2022-01-20: 80 mg via INTRAVENOUS
  Filled 2022-01-20: qty 80

## 2022-01-20 MED ORDER — ONDANSETRON HCL 4 MG/2ML IJ SOLN: 4.0000 mg | Freq: Four times a day (QID) | INTRAMUSCULAR | Status: DC | PRN

## 2022-01-20 MED ORDER — IOHEXOL 350 MG/ML SOLN
100.0000 mL | Freq: Once | INTRAVENOUS | Status: AC | PRN
  Administered 2022-01-20: 100 mL via INTRAVENOUS

## 2022-01-20 MED ORDER — ACETAMINOPHEN 325 MG PO TABS: 650.0000 mg | ORAL_TABLET | Freq: Four times a day (QID) | ORAL | Status: DC | PRN

## 2022-01-20 MED ORDER — ACETAMINOPHEN 650 MG RE SUPP: 650.0000 mg | Freq: Four times a day (QID) | RECTAL | Status: DC | PRN

## 2022-01-20 MED ORDER — PANTOPRAZOLE SODIUM 40 MG IV SOLR
40.0000 mg | Freq: Two times a day (BID) | INTRAVENOUS | Status: DC
Start: 2022-01-21 — End: 2022-01-21
  Administered 2022-01-21: 40 mg via INTRAVENOUS
  Filled 2022-01-20: qty 10

## 2022-01-20 MED ORDER — SODIUM CHLORIDE 0.9 % IV BOLUS
1000.0000 mL | Freq: Once | INTRAVENOUS | Status: AC
  Administered 2022-01-20: 1000 mL via INTRAVENOUS

## 2022-01-20 MED ORDER — ONDANSETRON HCL 4 MG PO TABS: 4.0000 mg | ORAL_TABLET | Freq: Four times a day (QID) | ORAL | Status: DC | PRN

## 2022-01-20 MED ORDER — SODIUM CHLORIDE 0.9% FLUSH
3.0000 mL | Freq: Two times a day (BID) | INTRAVENOUS | Status: DC
  Administered 2022-01-21: 3 mL via INTRAVENOUS

## 2022-01-20 MED ORDER — LACTATED RINGERS IV SOLN: INTRAVENOUS | Status: AC

## 2022-01-20 NOTE — Assessment & Plan Note (Signed)
Last seen by oncology, Dr. Janese Banks, 03/2018.  Considered to have low-grade lymphoproliferative disorder at the time.  Leukocytosis likely secondary to this, continue to monitor.

## 2022-01-20 NOTE — Assessment & Plan Note (Signed)
FOBT positive with dark stools and elevated BUN suspicious for upper GI bleeding.  Has history of massive lower GI bleeding presumed secondary to diverticulosis.  Hemoglobin 8.3, paired to baseline between 9.3-10. -GI to consult in a.m. -Continue IV Protonix 40 mg BID -Check H&H every 6 hours -Transfuse PRBC if hemoglobin <7.5

## 2022-01-20 NOTE — ED Provider Triage Note (Signed)
Emergency Medicine Provider Triage Evaluation Note  Dustin Howell , a 88 y.o. male with previous history of lower GI bleeding, CAD, stage II chronic kidney disease and hyperlipidemia was evaluated in triage.  Pt complains of GI bleeding that has been ongoing for a few days.  Patient states that he noticed blood last night and was "hoping to bleed enough to die".  Patient is accompanied by family member who states that she has noted both bright red blood and black stools for the last 3 days.  He also seems to be having some fecal incontinence as well.  No known trauma or injury.  He denies chest pain, shortness of breath, abdominal pain, nausea and vomiting.  Review of Systems  Positive: As above Negative:   Physical Exam  BP (!) 146/118 (BP Location: Right Arm)   Pulse (!) 122   Temp 98.1 F (36.7 C) (Oral)   Resp 18   Ht '5\' 5"'$  (1.651 m)   Wt 47.6 kg   SpO2 100%   BMI 17.47 kg/m  Gen:   Awake, no distress   Resp:  Normal effort  MSK:   Moves extremities without difficulty  Other:  Abdomen is soft, nondistended and nontender to palpation  Medical Decision Making  Medically screening exam initiated at 1:46 PM.  Appropriate orders placed.  Stillman P Lazarz was informed that the remainder of the evaluation will be completed by another provider, this initial triage assessment does not replace that evaluation, and the importance of remaining in the ED until their evaluation is complete.     Tonye Pearson, [place] 01/20/22 1348

## 2022-01-20 NOTE — Hospital Course (Signed)
Dustin Howell is a 88 y.o. male with medical history significant for C. difficile colitis, CAD s/p DES, lymphoproliferative disorder, history of GI bleed, diverticulosis, history of PE not on anticoagulation due to GI bleeding, hard of hearing who is admitted for evaluation of acute GI bleed.

## 2022-01-20 NOTE — ED Triage Notes (Signed)
Pt arrived POV from home c/o black stools since Sunday. Pt denies any pain or other complaints.

## 2022-01-20 NOTE — ED Notes (Signed)
Blood culture collected and held at bedside, from [facility]

## 2022-01-20 NOTE — Assessment & Plan Note (Signed)
S/p DES 04/2017.  Not on antiplatelets due to history of severe GI bleeding.  Denies chest pain.

## 2022-01-20 NOTE — Assessment & Plan Note (Signed)
Creatinine 1.35 on admission, likely hypoperfusion from acute on chronic anemia in setting of GI bleed. -Started on maintenance IV fluids -Follow H&H and transfuse PRBC as indicated

## 2022-01-20 NOTE — Assessment & Plan Note (Signed)
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

## 2022-01-20 NOTE — ED Provider Notes (Signed)
[facility] Provider Note   CSN: [id number] Arrival date & time: 01/20/22  1249     History  Chief Complaint  Patient presents with   Melena    Chau P Charlet is a 88 y.o. male history of previous GI bleed, PE but not on anticoagulant, here presenting with melena.  Patient states that he had dark stools for the last 2 days.  He states that he has 2-3 episodes a day.  He also noticed some blood around his stool.  Patient had a previous history of GI bleed that required embolization.  Patient is not currently on blood thinners.  Patient follows up with GI at [facility].   The history is provided by the patient.       Home Medications Prior to Admission medications   Medication Sig Start Date End Date Taking? Authorizing Provider  acetaminophen (TYLENOL) 325 MG tablet Take 1-2 tablets (325-650 mg total) by mouth every 6 (six) hours as needed for mild pain (pain score 1-3 or temp > 100.5). 07/26/20   Sharen Hones, MD  cholestyramine Lucrezia Starch) 4 g packet Take 1 packet (4 g total) by mouth 2 (two) times daily as needed for up to 14 days (Diarrhea). Use only if having active diarrhea. 09/23/20 10/07/20  Sidney Ace, MD  feeding supplement (ENSURE ENLIVE / ENSURE PLUS) LIQD Take 237 mLs by mouth 2 (two) times daily between meals. 07/26/20   Sharen Hones, MD  nitroGLYCERIN (NITROSTAT) 0.4 MG SL tablet Place 0.4 mg under the tongue every 5 (five) minutes as needed for chest pain.    [provider]  ondansetron (ZOFRAN ODT) 4 MG disintegrating tablet Take 1 tablet (4 mg total) by mouth every 8 (eight) hours as needed for nausea or vomiting. 09/23/20   Sidney Ace, MD      Allergies    Patient has no known allergies.    Review of Systems   Review of Systems  Gastrointestinal:  Positive for abdominal pain.  All other systems reviewed and are negative.   Physical Exam Updated Vital Signs BP 133/89 (BP Location: Right Arm)   Pulse  (!) 118   Temp 98.2 F (36.8 C) (Oral)   Resp 16   Ht '5\' 5"'$  (1.651 m)   Wt 47.6 kg   SpO2 100%   BMI 17.47 kg/m  Physical Exam Vitals and nursing note reviewed.  Constitutional:      Comments: Dehydrated and chronically ill  HENT:     Head: Normocephalic.     Nose: Nose normal.     Mouth/Throat:     Mouth: Mucous membranes are dry.  Eyes:     Extraocular Movements: Extraocular movements intact.     Pupils: Pupils are equal, round, and reactive to light.  Cardiovascular:     Rate and Rhythm: Normal rate and regular rhythm.     Pulses: Normal pulses.     Heart sounds: Normal heart sounds.  Pulmonary:     Effort: Pulmonary effort is normal.     Breath sounds: Normal breath sounds.  Abdominal:     General: Abdomen is flat.     Palpations: Abdomen is soft.  Genitourinary:    Comments: Rectal- melena  Musculoskeletal:        General: Normal range of motion.     Cervical back: Normal range of motion and neck supple.  Skin:    General: Skin is warm.     Capillary Refill: Capillary refill takes less  than 2 seconds.  Neurological:     General: No focal deficit present.     Mental Status: He is oriented to person, place, and time.  Psychiatric:        Mood and Affect: Mood normal.        Behavior: Behavior normal.     ED Results / Procedures / Treatments   Labs (all labs ordered are listed, but only abnormal results are displayed) Labs Reviewed  COMPREHENSIVE METABOLIC PANEL - Abnormal; Notable for the following components:      Result Value   Glucose, Bld 120 (*)    BUN 35 (*)    Creatinine, Ser 1.35 (*)    Total Bilirubin 0.2 (*)    GFR, Estimated 48 (*)    All other components within normal limits  CBC WITH DIFFERENTIAL/PLATELET - Abnormal; Notable for the following components:   WBC 22.2 (*)    RBC 2.92 (*)    Hemoglobin 8.3 (*)    HCT 26.3 (*)    Neutro Abs 8.4 (*)    Lymphs Abs 12.9 (*)    All other components within normal limits  POC OCCULT BLOOD, ED -  Abnormal; Notable for the following components:   Fecal Occult Bld POSITIVE (*)    All other components within normal limits  POC OCCULT BLOOD, ED - Abnormal; Notable for the following components:   Fecal Occult Bld POSITIVE (*)    All other components within normal limits  OCCULT BLOOD X 1 CARD TO LAB, STOOL  URINALYSIS, ROUTINE W REFLEX MICROSCOPIC  PATHOLOGIST SMEAR REVIEW  POC OCCULT BLOOD, ED    EKG None  Radiology No results found.  Procedures Procedures    Medications Ordered in ED Medications  pantoprazole (PROTONIX) 80 mg /NS 100 mL IVPB (has no administration in time range)  sodium chloride 0.9 % bolus 1,000 mL (1,000 mLs Intravenous New Bag/Given 01/20/22 1915)    ED Course/ Medical Decision Making/ A&P                           Medical Decision Making Emit P Garfinkel is a 88 y.o. male here presenting with melanotic stool.  Patient has melanotic stool for several days.  Patient had previous GI bleed. Patient is tachycardic and appears pale.  We will get CBC and CMP and CTA abdomen pelvis  9:25 PM Patient's guaiac is positive.  Hemoglobin is 8.3 which is close to baseline.  White count is elevated at 22.  CTA did not show any active bleeding or colitis.  I messaged Dr. Henrene Pastor from GI to see patient tomorrow.  At this point, hospitalist to admit for GI bleed.  Patient was started on Protonix.  Amount and/or Complexity of Data Reviewed Independent Historian: spouse Labs: ordered. Decision-making details documented in ED Course. Radiology: ordered and independent interpretation performed. Decision-making details documented in ED Course.  Risk Prescription drug management. Decision regarding hospitalization.    Final Clinical Impression(s) / ED Diagnoses Final diagnoses:  None    Rx / DC Orders ED Discharge Orders     None         Drenda Freeze, MD 01/20/22 2127

## 2022-01-20 NOTE — H&P (Signed)
History and Physical    Dustin Howell ZOX:[id number] DOB: [birth year] DOA: 01/20/2022  PCP: [name], MD  Patient coming from: Home  I have personally briefly reviewed patient's old medical records in [facility]  Chief Complaint: Black stools  HPI: Dustin Howell is a 88 y.o. male with medical history significant for C. difficile colitis, CAD s/p DES, lymphoproliferative disorder, history of GI bleed, diverticulosis, history of PE not on anticoagulation due to GI bleeding, hard of hearing who presented to the ED for evaluation of black stool.  History is supplemented by daughter at bedside.  Patient reports 2 days of dark stools.  He is having 2-3 episodes per day.  Has also noticed some initial red blood with the stool but mostly dark now.  He is not on any blood thinners due to history of severe GI bleed in the past.  He denies chest pain, dyspnea, abdominal pain, nausea, vomiting.  ED Course  Labs/Imaging on admission: I have personally reviewed following labs and imaging studies.  Initial vitals showed BP 146/118, pulse 122, RR 18, temp 98.1 F, SPO2 100% on room air.  Labs show hemoglobin 8.3 (baseline 9.3-10), hematocrit 26.3, platelets 236,000, WBC 22.2, sodium 139, potassium 4.5, bicarb 22, BUN 35, creatinine 1.35, serum glucose 120, AST 27, ALT 18, alk phos 84, total bilirubin 0.2.  FOBT is positive.  Urinalysis negative.  CTA abdomen/pelvis without contrast extravasation into the GI tract to localize site of GI bleed.  Moderate distal colonic diverticulosis without diverticulitis noted.  Similar appearance of ulcerated plaque in the abdominal aorta noted without aortic inflammation or acute findings.  Aneurysm of the infrarenal abdominal aorta measuring 3 cm.  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  Patient was given 1 L normal saline, IV Protonix 80 mg.  EDP discussed  with on-call [facility], Dr. Henrene Pastor, whose team will consult in a.m.  The hospitalist service was consulted to admit for further evaluation and management.  Review of Systems: All systems reviewed and are negative except as documented in history of present illness above.   Past Medical History:  Diagnosis Date   GERD (gastroesophageal reflux disease)    GI bleed 04/2016   presumed diverticular source, treated with embolization by IR   HOH (hard of hearing)    right sided hearing aid   Iron deficiency anemia 03/2018   Lymphoproliferative disorder ([place]) 03/2018   [place]  low grade monocytosis, thrombocytopenia: likely low-grade lymphproliferative disorder.      Past Surgical History:  Procedure Laterality Date   APPENDECTOMY     bilat. rotator cuff issues Bilateral    "stiff" shoulders   CORONARY STENT INTERVENTION N/A 04/21/2017   Procedure: CORONARY STENT INTERVENTION;  Surgeon: Isaias Cowman, MD;  Location: [facility];  Service: Cardiovascular;  Laterality: N/A;   FLEXIBLE SIGMOIDOSCOPY N/A 06/21/2020   Procedure: FLEXIBLE SIGMOIDOSCOPY;  Surgeon: Ladene Artist, MD;  Location: [facility] ENDOSCOPY;  Service: Endoscopy;  Laterality: N/A;   HOT HEMOSTASIS N/A 06/21/2020   Procedure: HOT HEMOSTASIS (ARGON PLASMA COAGULATION/BICAP);  Surgeon: Ladene Artist, MD;  Location: [facility] ENDOSCOPY;  Service: Endoscopy;  Laterality: N/A;   INTRAMEDULLARY (IM) NAIL INTERTROCHANTERIC Right 07/21/2020   Procedure: INTRAMEDULLARY (IM) NAIL INTERTROCHANTRIC;  Surgeon: Earnestine Leys, MD;  Location: [facility] ORS;  Service: Orthopedics;  Laterality: Right;   IR ANGIOGRAM SELECTIVE EACH ADDITIONAL VESSEL  06/20/2020  IR ANGIOGRAM VISCERAL SELECTIVE  06/20/2020   IR ANGIOGRAM VISCERAL SELECTIVE  06/20/2020   IR GENERIC HISTORICAL  05/12/2016   IR EMBO ART  VEN HEMORR LYMPH EXTRAV  INC GUIDE ROADMAPPING 05/12/2016 Corrie Mckusick, DO [facility]   IR GENERIC HISTORICAL  05/12/2016    IR ANGIOGRAM VISCERAL SELECTIVE 05/12/2016 Corrie Mckusick, DO [facility]   IR GENERIC HISTORICAL  05/12/2016   IR US GUIDE VASC ACCESS RIGHT 05/12/2016 Corrie Mckusick, DO [facility]   IR GENERIC HISTORICAL  05/12/2016   IR ANGIOGRAM SELECTIVE EACH ADDITIONAL VESSEL 05/12/2016 Corrie Mckusick, DO [facility]   IR GENERIC HISTORICAL  05/12/2016   IR ANGIOGRAM SELECTIVE EACH ADDITIONAL VESSEL 05/12/2016 Corrie Mckusick, DO [facility]   IR GENERIC HISTORICAL  05/12/2016   IR ANGIOGRAM FOLLOW UP STUDY 05/12/2016 Corrie Mckusick, DO [facility]   IR GENERIC HISTORICAL  05/12/2016   IR ANGIOGRAM SELECTIVE EACH ADDITIONAL VESSEL 05/12/2016 Corrie Mckusick, DO [facility]   IR US GUIDE VASC ACCESS RIGHT  06/20/2020   LEFT HEART CATH AND CORONARY ANGIOGRAPHY Left 04/21/2017   Procedure: LEFT HEART CATH AND CORONARY ANGIOGRAPHY;  Surgeon: Isaias Cowman, MD;  Location: [facility];  Service: Cardiovascular;  Laterality: Left;    Social History:  reports that he quit smoking about 66 years ago. His smoking use included cigarettes. He smoked an average of .5 packs per day. His smokeless tobacco use includes snuff. He reports current alcohol use. He reports that he does not use drugs.  No Known Allergies  Family History  Problem Relation Age of Onset   Diabetes Mother    Stroke Father    Ovarian cancer Sister      Prior to Admission medications   Medication Sig Start Date End Date Taking? Authorizing Provider  acetaminophen (TYLENOL) 325 MG tablet Take 1-2 tablets (325-650 mg total) by mouth every 6 (six) hours as needed for mild pain (pain score 1-3 or temp > 100.5). 07/26/20   Sharen Hones, MD  cholestyramine Lucrezia Starch) 4 g packet Take 1 packet (4 g total) by mouth 2 (two) times daily as needed for up to 14 days (Diarrhea). Use only if having active diarrhea. 09/23/20 10/07/20  Sidney Ace, MD  feeding supplement (ENSURE ENLIVE / ENSURE PLUS) LIQD Take 237 mLs by mouth 2  (two) times daily between meals. 07/26/20   Sharen Hones, MD  nitroGLYCERIN (NITROSTAT) 0.4 MG SL tablet Place 0.4 mg under the tongue every 5 (five) minutes as needed for chest pain.    [provider]  ondansetron (ZOFRAN ODT) 4 MG disintegrating tablet Take 1 tablet (4 mg total) by mouth every 8 (eight) hours as needed for nausea or vomiting. 09/23/20   Sidney Ace, MD    Physical Exam: Vitals:   01/20/22 1845 01/20/22 1859 01/20/22 1952 01/20/22 2116  BP: (!) 149/82  133/89 129/72  Pulse: (!) 128  (!) 118 98  Resp: (!) 23  16 16   Temp:  98.5 F (36.9 C) 98.2 F (36.8 C) 97.8 F (36.6 C)  TempSrc:  Oral Oral Oral  SpO2: 97%  100% 100%  Weight:      Height:       Constitutional: Elderly man resting supine in bed, NAD, calm, comfortable Eyes: EOMI, lids and conjunctivae normal ENMT: Mucous membranes are moist. Posterior pharynx clear of any exudate or lesions.Normal dentition.  Hard of hearing. Neck: normal, supple, no masses. Respiratory: clear to auscultation bilaterally, no wheezing, no crackles. Normal respiratory effort.  No accessory muscle use.  Cardiovascular: Regular rate and rhythm, no murmurs / rubs / gallops. No extremity edema. 2+ pedal pulses. Abdomen: no tenderness, no masses palpated. No hepatosplenomegaly. Bowel sounds positive.  Musculoskeletal: no clubbing / cyanosis. No joint deformity upper and lower extremities. Good ROM, no contractures. Normal muscle tone.  Skin: no rashes, lesions, ulcers. No induration Neurologic: Sensation intact. Strength equal bilaterally. Psychiatric: Alert and oriented x 3. Normal mood.   EKG: Not performed.  Assessment/Plan Principal Problem:   Acute GI bleeding Active Problems:   AKI (acute kidney injury) ([place])   CAD (coronary artery disease)   Lymphoproliferative disorder (HCC)   Atherosclerotic ulcer of aorta (HCC)   Infrarenal abdominal aortic aneurysm, without rupture ([place])   Dustin Howell is a 88 y.o. male  with medical history significant for C. difficile colitis, CAD s/p DES, lymphoproliferative disorder, history of GI bleed, diverticulosis, history of PE not on anticoagulation due to GI bleeding, hard of hearing who is admitted for evaluation of acute GI bleed.  Assessment and Plan: * Acute GI bleeding FOBT positive with dark stools and elevated BUN suspicious for upper GI bleeding.  Has history of massive lower GI bleeding presumed secondary to diverticulosis.  Hemoglobin 8.3, paired to baseline between 9.3-10. -GI to consult in a.m. -Continue IV Protonix 40 mg BID -Check H&H every 6 hours -Transfuse PRBC if hemoglobin <7.5  AKI (acute kidney injury) ([place]) Creatinine 1.35 on admission, likely hypoperfusion from acute on chronic anemia in setting of GI bleed. -Started on maintenance IV fluids -Follow H&H and transfuse PRBC as indicated  CAD (coronary artery disease) S/p DES 04/2017.  Not on antiplatelets due to history of severe GI bleeding.  Denies chest pain.  Lymphoproliferative disorder [facility]) Last seen by oncology, Dr. Janese Banks, 03/2018.  Considered to have low-grade lymphoproliferative disorder at the time.  Leukocytosis likely secondary to this, continue to monitor.  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  DVT prophylaxis: SCDs Start: 01/20/22 2240 Code Status: DNR, confirmed with daughter on admission Family Communication: Discussed with daughter at bedside Disposition Plan: From home and likely discharge to home pending clinical progress Consults called: [facility] Severity of Illness: The appropriate patient status for this patient is INPATIENT. Inpatient status is judged to be reasonable and necessary in order to provide the required intensity of service to ensure the patient's safety. The patient's  presenting symptoms, physical exam findings, and initial radiographic and laboratory data in the context of their chronic comorbidities is felt to place them at high risk for further clinical deterioration. Furthermore, it is not anticipated that the patient will be medically stable for discharge from the hospital within 2 midnights of admission.   * I certify that at the point of admission it is my clinical judgment that the patient will require inpatient hospital care spanning beyond 2 midnights from the point of admission due to high intensity of service, high risk for further deterioration and high frequency of surveillance required.Zada Finders MD [facility]  If 7PM-7AM, please contact night-coverage [email or web address]  01/20/2022, 10:50 PM

## 2022-01-21 DIAGNOSIS — D479 Neoplasm of uncertain behavior of lymphoid, hematopoietic and related tissue, unspecified: Secondary | ICD-10-CM

## 2022-01-21 DIAGNOSIS — D62 Acute posthemorrhagic anemia: Secondary | ICD-10-CM

## 2022-01-21 DIAGNOSIS — K922 Gastrointestinal hemorrhage, unspecified: Secondary | ICD-10-CM | POA: Diagnosis not present

## 2022-01-21 DIAGNOSIS — K5731 Diverticulosis of large intestine without perforation or abscess with bleeding: Principal | ICD-10-CM

## 2022-01-21 LAB — BASIC METABOLIC PANEL
Anion gap: 6 (ref 5–15)
BUN: 36 mg/dL — ABNORMAL HIGH (ref 8–23)
CO2: 22 mmol/L (ref 22–32)
Calcium: 8.5 mg/dL — ABNORMAL LOW (ref 8.9–10.3)
Chloride: 110 mmol/L (ref 98–111)
Creatinine, Ser: 1.23 mg/dL (ref 0.61–1.24)
GFR, Estimated: 54 mL/min — ABNORMAL LOW (ref >60)
Glucose, Bld: 99 mg/dL (ref 70–99)
Potassium: 4.1 mmol/L (ref 3.5–5.1)
Sodium: 138 mmol/L (ref 135–145)

## 2022-01-21 LAB — POC OCCULT BLOOD, ED: Fecal Occult Bld: NEGATIVE

## 2022-01-21 LAB — CBC
HCT: 21.6 % — ABNORMAL LOW (ref 39.0–52.0)
HCT: 26.4 % — ABNORMAL LOW (ref 39.0–52.0)
Hemoglobin: 6.8 g/dL — CL (ref 13.0–17.0)
Hemoglobin: 8.8 g/dL — ABNORMAL LOW (ref 13.0–17.0)
MCH: 29.2 pg (ref 26.0–34.0)
MCH: 29.6 pg (ref 26.0–34.0)
MCHC: 31.5 g/dL (ref 30.0–36.0)
MCHC: 33.3 g/dL (ref 30.0–36.0)
MCV: 92.7 fL (ref 80.0–100.0)
MCV: 88.9 fL (ref 80.0–100.0)
Platelets: 188 10*3/uL (ref 150–400)
Platelets: 172 10*3/uL (ref 150–400)
RBC: 2.33 MIL/uL — ABNORMAL LOW (ref 4.22–5.81)
RBC: 2.97 MIL/uL — ABNORMAL LOW (ref 4.22–5.81)
RDW: 15.1 % (ref 11.5–15.5)
RDW: 15.5 % (ref 11.5–15.5)
WBC: 14.3 10*3/uL — ABNORMAL HIGH (ref 4.0–10.5)
WBC: 12.3 10*3/uL — ABNORMAL HIGH (ref 4.0–10.5)
nRBC: 0 % (ref 0.0–0.2)
nRBC: 0 % (ref 0.0–0.2)

## 2022-01-21 LAB — PREPARE RBC (CROSSMATCH)

## 2022-01-21 MED ORDER — SODIUM CHLORIDE 0.9% IV SOLUTION: Freq: Once | INTRAVENOUS | Status: AC

## 2022-01-21 NOTE — ED Notes (Signed)
RECEIVED CRITICAL HGB 6.8. PAGED MD. Truddie Coco CALL BACK

## 2022-01-21 NOTE — Consult Note (Addendum)
Attending physician's note   I have taken a history, reviewed the chart, and examined the patient. I performed a substantive portion of this encounter, including complete performance of at least one of the key components, in conjunction with the APP. I agree with the APP's note, impression, and recommendations with my edits.   Discussed with patient and family member at bedside.  Good serologic response to 2 unit PRBC transfusion with posttransfusion H/H 8.8/26.4.  FOBT positive.  CTA was without active bleeding/extravasation.  Did demonstrate diverticulosis without diverticulitis along with severe celiac artery stenosis.  Patient and family member strongly prefer conservative management, and avoidance of endoscopic evaluation unless emergently necessary.  He is otherwise without continued bleeding.  They are adamant about heading home from the ER.  Recommended he check in with his PCM to repeat CBC within the next week or so.  GI service will remain available to him prn  Gerrit Heck, DO, FACG [phone] office                                                                                   [facility] Consult: 10:43 AM 01/21/2022  LOS: 1 day    Referring Provider: Antonieta Pert MD.    Primary Care Physician:  [name], MD [place].   Primary Gastroenterologist:  unassigned.  Seen only by GI as inpt.       Reason for Consultation:  Anemia.     HPI: Dustin Howell is a 88 y.o. male.  PMH CAD, cardiac stents 2018, not on plavix or AC meds.  Lymphoproliferative disorder, thrombocytopenia, platelets as low as 41 in 2017.  Hyponatremia as low as 130 16 months ago.  Stage 2 CKD.  Per previous CT scans: atherosclerosis of abdominal aorta with 3 cm aneurysm, ulcerative plaque versus penetrating ulcer at infrarenal AAA, celiac artery trunk stenosis.  Hard of  hearing. Patient has hospice care at home.  No prior full colonoscopy.  Not on PPI or H2B.   Incidences of lower GI bleed in 2017 and 2021.  Has required blood transfusions on both occasions. In 04/2016 he required massive transfusion protocol.  CT angio identified site of bleeding at rectosigmoid junction.  Underwent Gelfoam embolization distal branch mid colic artery In 61/6073 CT, angiogram had active bleeding at region rectosigmoid.  IR unable to localize arterial source, recommended sigmoidoscopy.  06/2020 flex sig for hematochezia.  CTA bleed .  Found moderate sigmoid tics, narrowing of colon associated with diverticular opening with diverticular spasm.  Old blood throughout rectum and sigmoid.  Solitary nonbleeding AVM at distal rectum treated with APC.  Conclusion was diverticular versus AVM bleed  Black stools started on Sunday.  No associated abdominal pain.  Since then has had both black and bright red bloody stools.  Had 1 episode each day on Sunday, Monday.  Yesterday had more frequent small-volume passage of bloody stool.  The last episode was yesterday evening.  Continues to deny abdominal pain, nausea, vomiting. Daughter tells me that about a month ago she was a confirmed COVID case.  Patient lives with her and developed a cough.  Hospice providers felt he did not have pneumonia.  He was never tested or treated.  Respiratory symptoms have since resolved.  Appetite fair, sometimes poor.  Daughter says he is lost about 5 pounds in the last month.  About a week ago he experienced some left arm and leg weakness that was not formally evaluated, since then he is not quite as ambulatory as he had been.  Previously ambulated around the house pretty easily with walker, cane.  At arrival to ED blood pressure 146/118, heart rate 122.  Excellent room air sats.  No fever.  Blood pressure has normalized, tachycardia resolved.  Melena on rectal exam. Hgb 8.3..  6.8.  MCV 90. WBCs 22..  14 BUN  elevated at 36, creatinine has normalized. CT angio Ab/pelvis: No active bleeding.  Distal colonic diverticulosis.  Stable ulcerated plaque at abdominal aorta infrarenal AAA 3 cm.  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  Mild hepatic steatosis.   KUB unremarkable  Now lives with his daughter.  No EtOH.    Past Medical History:  Diagnosis Date   GERD (gastroesophageal reflux disease)    GI bleed 04/2016   presumed diverticular source, treated with embolization by IR   HOH (hard of hearing)    right sided hearing aid   Iron deficiency anemia 03/2018   Lymphoproliferative disorder ([place]) 03/2018   heme: Dr Janese Banks in [place].  low grade monocytosis, thrombocytopenia: likely low-grade lymphproliferative disorder.      Past Surgical History:  Procedure Laterality Date   APPENDECTOMY     bilat. rotator cuff issues Bilateral    "stiff" shoulders   CORONARY STENT INTERVENTION N/A 04/21/2017   Procedure: CORONARY STENT INTERVENTION;  Surgeon: Isaias Cowman, MD;  Location: [place];  Service: Cardiovascular;  Laterality: N/A;   FLEXIBLE SIGMOIDOSCOPY N/A 06/21/2020   Procedure: FLEXIBLE SIGMOIDOSCOPY;  Surgeon: Ladene Artist, MD;  Location: [facility] ENDOSCOPY;  Service: Endoscopy;  Laterality: N/A;   HOT HEMOSTASIS N/A 06/21/2020   Procedure: HOT HEMOSTASIS (ARGON PLASMA COAGULATION/BICAP);  Surgeon: Ladene Artist, MD;  Location: [facility] ENDOSCOPY;  Service: Endoscopy;  Laterality: N/A;   INTRAMEDULLARY (IM) NAIL INTERTROCHANTERIC Right 07/21/2020   Procedure: INTRAMEDULLARY (IM) NAIL INTERTROCHANTRIC;  Surgeon: Earnestine Leys, MD;  Location: [facility] ORS;  Service: Orthopedics;  Laterality: Right;   IR ANGIOGRAM SELECTIVE EACH ADDITIONAL VESSEL  06/20/2020   IR ANGIOGRAM VISCERAL SELECTIVE  06/20/2020   IR ANGIOGRAM VISCERAL SELECTIVE  06/20/2020   IR GENERIC HISTORICAL  05/12/2016   IR EMBO ART   VEN HEMORR LYMPH EXTRAV  INC GUIDE ROADMAPPING 05/12/2016 Corrie Mckusick, DO [facility]   IR GENERIC HISTORICAL  05/12/2016   IR ANGIOGRAM VISCERAL SELECTIVE 05/12/2016 Corrie Mckusick, DO [facility]   IR GENERIC HISTORICAL  05/12/2016   IR US GUIDE VASC ACCESS RIGHT 05/12/2016 Corrie Mckusick, DO [facility]   IR GENERIC HISTORICAL  05/12/2016   IR [facility] SELECTIVE EACH ADDITIONAL VESSEL 05/12/2016 Corrie Mckusick, DO [facility]   IR GENERIC HISTORICAL  05/12/2016   IR ANGIOGRAM SELECTIVE EACH ADDITIONAL VESSEL 05/12/2016 Corrie Mckusick, DO [facility]   IR GENERIC HISTORICAL  05/12/2016   IR ANGIOGRAM FOLLOW UP STUDY 05/12/2016 Corrie Mckusick, DO [facility]   IR GENERIC HISTORICAL  05/12/2016   IR ANGIOGRAM SELECTIVE EACH ADDITIONAL VESSEL 05/12/2016 Corrie Mckusick, DO [facility]   IR US GUIDE VASC ACCESS RIGHT  06/20/2020   LEFT HEART CATH AND CORONARY ANGIOGRAPHY Left 04/21/2017   Procedure: LEFT HEART CATH AND  CORONARY ANGIOGRAPHY;  Surgeon: Isaias Cowman, MD;  Location: [place] [facility];  Service: Cardiovascular;  Laterality: Left;    Prior to Admission medications   Medication Sig Start Date End Date Taking? Authorizing Provider  diphenhydramine-acetaminophen (TYLENOL PM) 25-500 MG TABS tablet Take 1 tablet by mouth at bedtime.   Yes [provider]  feeding supplement (ENSURE ENLIVE / ENSURE PLUS) LIQD Take 237 mLs by mouth 2 (two) times daily between meals. 07/26/20  Yes Sharen Hones, MD  nitroGLYCERIN (NITROSTAT) 0.4 MG SL tablet Place 0.4 mg under the tongue every 5 (five) minutes as needed for chest pain.   Yes [provider]  triamcinolone cream (KENALOG) 0.1 % Apply 1 Application topically daily as needed (For rash). 09/18/21  Yes [provider]  acetaminophen (TYLENOL) 325 MG tablet Take 1-2 tablets (325-650 mg total) by mouth every 6 (six) hours as needed for mild pain (pain score 1-3 or temp > 100.5). Patient not taking: Reported on  01/20/2022 07/26/20   Sharen Hones, MD  cholestyramine Lucrezia Starch) 4 g packet Take 1 packet (4 g total) by mouth 2 (two) times daily as needed for up to 14 days (Diarrhea). Use only if having active diarrhea. Patient not taking: Reported on 01/20/2022 09/23/20 10/07/20  Sidney Ace, MD  ondansetron (ZOFRAN ODT) 4 MG disintegrating tablet Take 1 tablet (4 mg total) by mouth every 8 (eight) hours as needed for nausea or vomiting. Patient not taking: Reported on 01/20/2022 09/23/20   Sidney Ace, MD    Scheduled Meds:  pantoprazole (PROTONIX) IV  40 mg Intravenous Q12H   sodium chloride flush  3 mL Intravenous Q12H   Infusions:  PRN Meds: acetaminophen **OR** acetaminophen, ondansetron **OR** ondansetron (ZOFRAN) IV   Allergies as of 01/20/2022   (No Known Allergies)    Family History  Problem Relation Age of Onset   Diabetes Mother    Stroke Father    Ovarian cancer Sister     Social History   Socioeconomic History   Marital status: Widowed    Spouse name: Not on file   Number of children: Not on file   Years of education: Not on file   Highest education level: Not on file  Occupational History   Not on file  Tobacco Use   Smoking status: Former    Packs/day: 0.50    Types: Cigarettes    Quit date: 3    Years since quitting: 66.5   Smokeless tobacco: Current    Types: Snuff  Substance and Sexual Activity   Alcohol use: Yes    Comment: every evening with supper   Drug use: No   Sexual activity: Never  Other Topics Concern   Not on file  Social History Narrative   Not on file   Social Determinants of Health   Financial Resource Strain: Not on file  Food Insecurity: Not on file  Transportation Needs: Not on file  Physical Activity: Not on file  Stress: Not on file  Social Connections: Not on file  Intimate Partner Violence: Not on file    REVIEW OF SYSTEMS: Constitutional: Some decline in ability to get around the house in the last couple of  weeks. ENT:  No nose bleeds Pulm: Resolved cough as per HPI. CV:  No palpitations, no LE edema.  No angina. GU:  No hematuria, no frequency GI: See HPI. Heme: No unusual or excessive bleeding/bruising except for the GI bleeding of the last few days Transfusions: Per HPI. Neuro:  No headaches, no peripheral tingling or numbness.  No syncope, no dizziness Derm:  No itching, no rash or sores.  Endocrine:  No sweats or chills.  No polyuria or dysuria Immunization: Patient does not think he has been vaccinated for COVID-19 Travel:  None beyond local counties in last few months.    PHYSICAL EXAM: Vital signs in last 24 hours: Vitals:   01/21/22 0940 01/21/22 0943  BP: 129/62 129/62  Pulse: 95 86  Resp: 17 17  Temp: 98.2 F (36.8 C) 98.2 F (36.8 C)  SpO2:  97%   Wt Readings from Last 3 Encounters:  01/20/22 47.6 kg  01/16/22 47.7 kg  09/23/20 57.2 kg    General: Patient is aged, frail, somewhat hard of hearing. Head: No signs of head trauma.  No facial asymmetry. Eyes: No conjunctival pallor or scleral icterus Ears: Hard of hearing but if you talk slowly, clearly with a louder volume he can hear just fine Nose: No congestion or discharge Mouth: Dry mucous membranes.  Edentulous.  Tongue midline Neck: No JVD, no masses Lungs: No cough, no labored breathing.  Lungs clear. Heart: RRR.  No MRG.  S1, S2 present Abdomen: Soft without tenderness.  No HSM, masses, bruits, hernias.   Rectal: Did not repeat DRE.  He had melena on DRE last evening. Musc/Skeltl: No joint redness, swelling or gross deformity Extremities: No CCE. Neurologic: Alert.  Appropriate.  Moves all 4 limbs.  Oriented to self, birthdate and president.  Not oriented to time or place.   Skin: No suspicious bruising or rash.  Nodes: No cervical adenopathy Psych: Calm.  Intake/Output from previous day: 07/11 0701 - 07/12 0700 In: 1000 [IV Piggyback:1000] Out: -  Intake/Output this shift: Total I/O In: 503  [I.V.:3; Blood:500] Out: -   LAB RESULTS: Recent Labs    01/20/22 1349 01/21/22 0355  WBC 22.2* 14.3*  HGB 8.3* 6.8*  HCT 26.3* 21.6*  PLT 236 188   BMET Lab Results  Component Value Date   NA 138 01/21/2022   NA 139 01/20/2022   NA 133 (L) 09/23/2020   K 4.1 01/21/2022   K 4.5 01/20/2022   K 3.8 09/23/2020   CL 110 01/21/2022   CL 105 01/20/2022   CL 109 09/23/2020   CO2 22 01/21/2022   CO2 22 01/20/2022   CO2 20 (L) 09/23/2020   GLUCOSE 99 01/21/2022   GLUCOSE 120 (H) 01/20/2022   GLUCOSE 76 09/23/2020   BUN 36 (H) 01/21/2022   BUN 35 (H) 01/20/2022   BUN 20 09/23/2020   CREATININE 1.23 01/21/2022   CREATININE 1.35 (H) 01/20/2022   CREATININE 0.85 09/23/2020   CALCIUM 8.5 (L) 01/21/2022   CALCIUM 9.2 01/20/2022   CALCIUM 7.2 (L) 09/23/2020   LFT Recent Labs    01/20/22 1349  PROT 7.0  ALBUMIN 3.5  AST 27  ALT 18  ALKPHOS 84  BILITOT 0.2*   PT/INR Lab Results  Component Value Date   INR 1.0 09/12/2020   INR 1.0 07/20/2020   INR 1.25 05/11/2016   Hepatitis Panel No results for input(s): "HEPBSAG", "HCVAB", "HEPAIGM", "HEPBIGM" in the last 72 hours. C-Diff No components found for: "CDIFF" Lipase     Component Value Date/Time   LIPASE 37 05/11/2016 1741    Drugs of Abuse  No results found for: "LABOPIA", "COCAINSCRNUR", "LABBENZ", "AMPHETMU", "THCU", "LABBARB"   RADIOLOGY STUDIES: CT Angio Abd/Pel W and/or Wo Contrast  Result Date: 01/20/2022 CLINICAL DATA:  88 year old with lower GI bleed.  EXAM: CTA ABDOMEN AND PELVIS WITHOUT AND WITH CONTRAST TECHNIQUE: Multidetector CT imaging of the abdomen and pelvis was performed using the standard protocol during bolus administration of intravenous contrast. Multiplanar reconstructed images and MIPs were obtained and reviewed to evaluate the vascular anatomy. RADIATION DOSE REDUCTION: This exam was performed according to the departmental dose-optimization program which includes automated exposure  control, adjustment of the mA and/or kV according to patient size and/or use of iterative reconstruction technique. CONTRAST:  162m OMNIPAQUE IOHEXOL 350 MG/ML SOLN COMPARISON:  Abdominal CT 09/12/2020 FINDINGS: VASCULAR Aorta: Moderate calcified noncalcified atheromatous plaque. There is an outpouching of contrast into mural thrombus consistent with ulcerative plaque, series 10, image 50. This does not extend beyond the aortic lumen. No periaortic stranding. Aneurysm at this segment measures 3 cm. 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 This is likely due to the diaphragmatic crus and noncalcified mural distal branch vessels are patent. Thrombus. SMA: Patent without evidence of aneurysm, dissection, vasculitis or significant stenosis. Renals: Both renal arteries are patent. Mild stenosis of the proximal left renal artery. Single bilateral renal arteries bilaterally. IMA: Small caliber but patent. Inflow: Moderately calcified. Irregular plaque causes mild narrowing of the left internal iliac artery. No severe stenosis or acute findings. Proximal Outflow: Bilateral common femoral and visualized portions of the superficial and profunda femoral arteries are patent without evidence of aneurysm, dissection, vasculitis or significant stenosis. Veins: Venous phase imaging demonstrates patency of the iliac veins and IVC. Patent superior mesenteric, splenic, and portal vein. Circumaortic left renal vein. Review of the MIP images confirms the above findings. NON-VASCULAR Lower chest: Basilar bronchiolectasis with linear atelectasis in the left lower lobe. The heart is normal in size. There are coronary artery calcifications. Hepatobiliary: Mild subjective hepatic steatosis. No focal hepatic lesion. Unremarkable gallbladder. No biliary dilatation. Pancreas: No ductal dilatation or inflammation. Spleen: Normal in size without focal abnormality. Adrenals/Urinary Tract: No adrenal nodule. Left renal cysts  include parapelvic cysts. Small cyst in the lower right kidney. These need no further follow-up. No hydronephrosis. Mild urinary bladder distention without wall thickening. Stomach/Bowel: No contrast extravasation into the GI tract to localize site of GI bleed. Moderate distal colonic diverticulosis without focal diverticulitis. No small bowel obstruction or inflammatory change. The appendix is not confidently seen on the current exam, no appendicitis. The stomach is nondistended. Lymphatic: No bulky adenopathy. Reproductive: Prostate is unremarkable. Other: No free air or ascites.  No abdominal wall hernia. Musculoskeletal: The bones are under mineralized. Age related degenerative change. Surgical hardware in the right proximal femur. No acute osseous findings. IMPRESSION: 1. No contrast extravasation into the GI tract to localize site of GI bleed. 2. Moderate distal colonic diverticulosis without acute diverticulitis. 3. Similar appearance of ulcerated plaque in the abdominal aorta. No aortic inflammation or acute findings. Aneurysm of the infrarenal abdominal aorta measuring 3 cm. Consensus guidelines recommend follow-up every 3 years, giving consideration for patient's advanced age. This recommendation follows ACR consensus guidelines: White Paper of the ACR Incidental Findings Committee II on Vascular Findings. J Am Coll Radiol 2013; 10:789-794. 4. 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 Distal branch vessels are patent. 5. Mild hepatic steatosis. Electronically Signed   By: MKeith RakeM.D.   On: 01/20/2022 21:10      IMPRESSION:   GI bleed with both melena and hematochezia.  No active bleeding on CT angio. Previous history of lower GI bleeds in  2017 and 2021.  Status post Gelfoam embolization distal branch mid colic artery 2993.  71/6967 flex sig with diverticulosis, APC of nonbleeding AVM at distal rectum.  Felt to  have had either diverticular or AVM bleed.  Current CTA fails to demonstrate active bleeding.  Blood loss anemia.  1 PRBC transfused within the last few hours  PVD with celiac artery stenosis, patent branch vessels.  Hepatic steatosis.   PLAN:       Colonoscopy, EGD.  Discussed with the patient's daughter Joseph Art.  She prefers not to pursue endoscopic procedures unless absolutely necessary.  Will add clear liquids to orders.  Not clear he needs IV Protonix but will continue what is ordered, Protonix 40 iv bid, already received the 80 mg bolus.   [name]  01/21/2022, 10:43 AM Phone [phone]

## 2022-01-21 NOTE — Progress Notes (Signed)
Overnight progress note  Hemoglobin 6.8 on morning labs.  Per RN, no further bowel movements tonight.  Hemodynamically stable. -Type and screen, 1 unit PRBCs ordered after obtaining consent from the patient.  Follow-up posttransfusion H&H.

## 2022-01-21 NOTE — ED Notes (Signed)
Pt and daughter [name] have signed [facility] electronic form.

## 2022-01-21 NOTE — Progress Notes (Signed)
PROGRESS NOTE Dustin Howell  NFA:[id number] DOB: 1925-07-13 DOA: 01/20/2022 PCP: [name], MD   Brief Narrative/Hospital Course: 88 y.o. male with medical history significant for C. difficile colitis, CAD s/p DES, lymphoproliferative disorder, history of GI bleed, diverticulosis, history of PE not on anticoagulation due to GI bleeding, hard of hearing who is admitted for evaluation of acute GI bleed.Patient reports 2 days of dark stools.  He is having 2-3 episodes per day.  Has also noticed some initial red blood with the stool but mostly dark now.  He is not on any blood thinners due to history of severe GI bleed in the past. In the ED hemodynamically stable, labs with hemoglobin 8.3 baseline 9.3-10, creatinine 1.3 BUN 35, FOBT positive. CTA abdomen/pelvis without contrast extravasation into the GI tract to localize site of GI bleed. Moderate distal colonic diverticulosis without diverticulitis noted.  Similar appearance of ulcerated plaque in the abdominal aorta noted without aortic inflammation or acute findings.  Aneurysm of the infrarenal abdominal aorta measuring 3 cm.  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  EDP discussed with on-call [facility], Dr. Henrene Pastor, and admitted. 7/12-overnight hemoglobin dropped to 6.8 g, 1 unit PRBC ordered. Wbc 22k>14k, creat 1.3> 1.2.    Subjective: Seen and examined.  Daughter at the bedside.   Patient does not remember if he got blood transfusion.   7/12-overnight hemoglobin dropped to 6.8 g, 1 unit PRBC givne. Wbc 22k>14k, creat 1.3> 1.2.   Assessment and Plan: Principal Problem:   Acute GI bleeding Active Problems:   AKI (acute kidney injury) ([place])   CAD (coronary artery disease)   Lymphoproliferative disorder (HCC)   Atherosclerotic ulcer of aorta (HCC)   Infrarenal abdominal aortic aneurysm,  without rupture (HCC)   Acute GI bleeding-fobt+ Acute blood loss anemia on chronic anemia: Transfused 1 unit PRBC.  Hemoglobin dropped to 6.8 g, on admission 8.3, baseline 9.3-10.  Continue PPI twice daily, H&H every 6 hours, GI has been consulted.  Repeat CBC is pending  AKI: Creatinine improved.  Monitor CAD-DES 04/2017: Not on antiplatelets due to severe GI bleeding.  Stable without chest pain Lymphoproliferative disorder: Low-grade lymphoproliferative disorder last seen 2019, with leukocytosis.  Monitor  Atherosclerotic ulcer of aorta  Infrarenal abdominal aortic aneurysm, without rupture : Stable appearance of ulcerated plaque in the abdominal aorta and infrarenal abdominal aortic aneurysm noted CT  DVT prophylaxis: SCDs Start: 01/20/22 2240 Code Status:   Code Status: DNR Family Communication: plan of care discussed with patient/daughter at bedside. Patient status is: Inpatient because of ongoing management of anemia and GI bleeding Level of care: Telemetry Medical   Dispo: The patient is from: Home lives with his daughter            Anticipated disposition: unknown  Mobility Assessment (last 72 hours)     Mobility Assessment   No documentation.            Objective: Vitals last 24 hrs: Vitals:   01/21/22 0700 01/21/22 0754 01/21/22 0940 01/21/22 0943  BP: (!) 146/60  129/62 129/62  Pulse: 87  95 86  Resp: '14  17 17  '$ Temp: 98 F (36.7 C)  98.2 F (36.8 C) 98.2 F (36.8 C)  TempSrc: Oral  Oral Oral  SpO2:  97%  97%  Weight:      Height:  Weight change:   Physical Examination: General exam: alert awake, pleasant, forgetful older than stated age, weak appearing. HEENT:Oral mucosa moist, Ear/Nose WNL grossly, dentition normal. Respiratory system: bilaterally diminished BS, no use of accessory muscle Cardiovascular system: S1 & S2 +, No JVD. Gastrointestinal system: Abdomen soft,NT,ND, BS+ Nervous System:Alert, awake, moving extremities and grossly  nonfocal Extremities: LE edema neg,distal peripheral pulses palpable.  Skin: No rashes,no icterus. MSK: Normal muscle bulk,tone, power  Medications reviewed:  Scheduled Meds:  pantoprazole (PROTONIX) IV  40 mg Intravenous Q12H   sodium chloride flush  3 mL Intravenous Q12H   Continuous Infusions:    Diet Order             Diet clear liquid Room service appropriate? Yes; Fluid consistency: Thin  Diet effective now                            Intake/Output Summary (Last 24 hours) at 01/21/2022 1251 Last data filed at 01/21/2022 0953 Gross per 24 hour  Intake 1503 ml  Output --  Net 1503 ml   Net IO Since Admission: 1,503 mL [01/21/22 1251]  Wt Readings from Last 3 Encounters:  01/20/22 47.6 kg  01/16/22 47.7 kg  09/23/20 57.2 kg     Unresulted Labs (From admission, onward)     Start     Ordered   01/22/22 4765  Basic metabolic panel  Daily at 5am,   R      01/21/22 0711   01/21/22 1125  CBC  5A & 5P,   R      01/21/22 1124   01/20/22 1349  Pathologist smear review  Once,   R        01/20/22 1349          Data Reviewed: I have personally reviewed following labs and imaging studies CBC: Recent Labs  Lab 01/20/22 1349 01/21/22 0355  WBC 22.2* 14.3*  NEUTROABS 8.4*  --   HGB 8.3* 6.8*  HCT 26.3* 21.6*  MCV 90.1 92.7  PLT 236 465   Basic Metabolic Panel: Recent Labs  Lab 01/20/22 1349 01/21/22 0355  NA 139 138  K 4.5 4.1  CL 105 110  CO2 22 22  GLUCOSE 120* 99  BUN 35* 36*  CREATININE 1.35* 1.23  CALCIUM 9.2 8.5*   GFR: Estimated Creatinine Clearance: 24.2 mL/min (by C-G formula based on SCr of 1.23 mg/dL). Liver Function Tests: Recent Labs  Lab 01/20/22 1349  AST 27  ALT 18  ALKPHOS 84  BILITOT 0.2*  PROT 7.0  ALBUMIN 3.5   No results for input(s): "LIPASE", "AMYLASE" in the last 168 hours. No results for input(s): "AMMONIA" in the last 168 hours. Coagulation Profile: No results for input(s): "INR", "PROTIME" in the last 168  hours. BNP (last 3 results) No results for input(s): "PROBNP" in the last 8760 hours. HbA1C: No results for input(s): "HGBA1C" in the last 72 hours. CBG: No results for input(s): "GLUCAP" in the last 168 hours. Lipid Profile: No results for input(s): "CHOL", "HDL", "LDLCALC", "TRIG", "CHOLHDL", "LDLDIRECT" in the last 72 hours. Thyroid Function Tests: No results for input(s): "TSH", "T4TOTAL", "FREET4", "T3FREE", "THYROIDAB" in the last 72 hours. Sepsis Labs: No results for input(s): "PROCALCITON", "LATICACIDVEN" in the last 168 hours.  No results found for this or any previous visit (from the past 240 hour(s)).  Antimicrobials: Anti-infectives (From admission, onward)    None      Culture/Microbiology  Component Value Date/Time   SDES BLOOD RIGHT ANTECUBITAL 09/12/2020 1521   SDES BLOOD LEFT ANTECUBITAL 09/12/2020 1521   SPECREQUEST  09/12/2020 1521    BOTTLES DRAWN AEROBIC AND ANAEROBIC Blood Culture adequate volume   SPECREQUEST  09/12/2020 1521    BOTTLES DRAWN AEROBIC AND ANAEROBIC Blood Culture adequate volume   CULT  09/12/2020 1521    NO GROWTH 5 DAYS Performed at [facility], [address] [place], [place] [address]    CULT  09/12/2020 1521    NO GROWTH 5 DAYS Performed at [facility], [address] [place],  [address]    REPTSTATUS 09/17/2020 FINAL 09/12/2020 1521   REPTSTATUS 09/17/2020 FINAL 09/12/2020 1521    Other culture-see note  Radiology Studies: CT Angio Abd/Pel W and/or Wo Contrast  Result Date: 01/20/2022 CLINICAL DATA:  88 year old with lower GI bleed. EXAM: CTA ABDOMEN AND PELVIS WITHOUT AND WITH CONTRAST TECHNIQUE: Multidetector CT imaging of the abdomen and pelvis was performed using the standard protocol during bolus administration of intravenous contrast. Multiplanar reconstructed images and MIPs were obtained and reviewed to evaluate the vascular anatomy. RADIATION DOSE REDUCTION: This exam was performed  according to the departmental dose-optimization program which includes automated exposure control, adjustment of the mA and/or kV according to patient size and/or use of iterative reconstruction technique. CONTRAST:  133m OMNIPAQUE IOHEXOL 350 MG/ML SOLN COMPARISON:  Abdominal CT 09/12/2020 FINDINGS: VASCULAR Aorta: Moderate calcified noncalcified atheromatous plaque. There is an outpouching of contrast into mural thrombus consistent with ulcerative plaque, series 10, image 50. This does not extend beyond the aortic lumen. No periaortic stranding. Aneurysm at this segment measures 3 cm. 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 This is likely due to the diaphragmatic crus and noncalcified mural distal branch vessels are patent. Thrombus. SMA: Patent without evidence of aneurysm, dissection, vasculitis or significant stenosis. Renals: Both renal arteries are patent. Mild stenosis of the proximal left renal artery. Single bilateral renal arteries bilaterally. IMA: Small caliber but patent. Inflow: Moderately calcified. Irregular plaque causes mild narrowing of the left internal iliac artery. No severe stenosis or acute findings. Proximal Outflow: Bilateral common femoral and visualized portions of the superficial and profunda femoral arteries are patent without evidence of aneurysm, dissection, vasculitis or significant stenosis. Veins: Venous phase imaging demonstrates patency of the iliac veins and IVC. Patent superior mesenteric, splenic, and portal vein. Circumaortic left renal vein. Review of the MIP images confirms the above findings. NON-VASCULAR Lower chest: Basilar bronchiolectasis with linear atelectasis in the left lower lobe. The heart is normal in size. There are coronary artery calcifications. Hepatobiliary: Mild subjective hepatic steatosis. No focal hepatic lesion. Unremarkable gallbladder. No biliary dilatation. Pancreas: No ductal dilatation or inflammation. Spleen: Normal in size  without focal abnormality. Adrenals/Urinary Tract: No adrenal nodule. Left renal cysts include parapelvic cysts. Small cyst in the lower right kidney. These need no further follow-up. No hydronephrosis. Mild urinary bladder distention without wall thickening. Stomach/Bowel: No contrast extravasation into the GI tract to localize site of GI bleed. Moderate distal colonic diverticulosis without focal diverticulitis. No small bowel obstruction or inflammatory change. The appendix is not confidently seen on the current exam, no appendicitis. The stomach is nondistended. Lymphatic: No bulky adenopathy. Reproductive: Prostate is unremarkable. Other: No free air or ascites.  No abdominal wall hernia. Musculoskeletal: The bones are under mineralized. Age related degenerative change. Surgical hardware in the right proximal femur. No acute osseous findings. IMPRESSION: 1. No contrast extravasation into the GI  tract to localize site of GI bleed. 2. Moderate distal colonic diverticulosis without acute diverticulitis. 3. Similar appearance of ulcerated plaque in the abdominal aorta. No aortic inflammation or acute findings. Aneurysm of the infrarenal abdominal aorta measuring 3 cm. Consensus guidelines recommend follow-up every 3 years, giving consideration for patient's advanced age. This recommendation follows ACR consensus guidelines: White Paper of the ACR Incidental Findings Committee II on Vascular Findings. J Am Coll Radiol 2013; 10:789-794. 4. 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 Distal branch vessels are patent. 5. Mild hepatic steatosis. Electronically Signed   By: Keith Rake M.D.   On: 01/20/2022 21:10     LOS: 1 day   Antonieta Pert, MD [facility]  01/21/2022, 12:51 PM

## 2022-01-22 ENCOUNTER — Other Ambulatory Visit: Payer: Medicare Other | Admitting: Student

## 2022-01-22 ENCOUNTER — Telehealth: Payer: Self-pay | Admitting: Student

## 2022-01-22 LAB — TYPE AND SCREEN
ABO/RH(D): O POS
Antibody Screen: NEGATIVE
Unit division: 0

## 2022-01-22 LAB — PATHOLOGIST SMEAR REVIEW

## 2022-01-22 LAB — BPAM RBC
Blood Product Expiration Date: 202307232359
ISSUE DATE / TIME: 202307120633
Unit Type and Rh: 5100

## 2022-01-22 NOTE — Telephone Encounter (Signed)
Palliative NP returned call to patients daughter Joseph Art. Patient has had 3 melana stools since returning home. He was transfused 1 u pRBC's while in ED. He was supposed to follow up with GI. Patient states he does not any further work-up. He would like to be managed in the home. He was previously on hospice and d/c due to stability. Patient and daughter are in agreement with hospice. PCP notified with urgent request. He has not been taking protonix; script sent.

## 2022-01-22 NOTE — Discharge Summary (Signed)
AMA  Patient and his daughter at this time expresses desire to leave the Hospital immidiately, patient has been warned that this is not Medically advisable at this time, and can result in Medical complications like Death and Disability, patient understands and accepts the risks involved and assumes full responsibilty of this decision. RN and Team made best effort to convince him to stay- he does not want any further work up and wants to go home.  Antonieta Pert M.D on 01/22/2022 at 5:13 PM  [facility]  Time < 30 minutes  Last Note Below   PROGRESS NOTE Dustin Howell  UJW:[id number] DOB: 15-Jun-1925 DOA: 01/20/2022 PCP: [name], MD   Brief Narrative/Hospital Course: 88 y.o. male with medical history significant for C. difficile colitis, CAD s/p DES, lymphoproliferative disorder, history of GI bleed, diverticulosis, history of PE not on anticoagulation due to GI bleeding, hard of hearing who is admitted for evaluation of acute GI bleed.Patient reports 2 days of dark stools.  He is having 2-3 episodes per day.  Has also noticed some initial red blood with the stool but mostly dark now.  He is not on any blood thinners due to history of severe GI bleed in the past. In the ED hemodynamically stable, labs with hemoglobin 8.3 baseline 9.3-10, creatinine 1.3 BUN 35, FOBT positive. CTA abdomen/pelvis without contrast extravasation into the GI tract to localize site of GI bleed. Moderate distal colonic diverticulosis without diverticulitis noted.  Similar appearance of ulcerated plaque in the abdominal aorta noted without aortic inflammation or acute findings.  Aneurysm of the infrarenal abdominal aorta measuring 3 cm.  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  EDP discussed with on-call [facility], Dr. Henrene Pastor, and admitted. 7/12-overnight hemoglobin dropped to 6.8 g, 1 unit PRBC givne. Wbc 22k>14k, creat 1.3> 1.2.    Subjective: Seen and examined.  Daughter at the bedside.   Patient does not remember if he got blood transfusion.   7/12-overnight hemoglobin dropped to 6.8 g, 1 unit PRBC givne. Wbc 22k>14k, creat 1.3> 1.2.   Assessment and Plan: Principal Problem:   Acute GI bleeding Active Problems:   AKI (acute kidney injury) ([place])   CAD (coronary artery disease)   Lymphoproliferative disorder (HCC)   Atherosclerotic ulcer of aorta (HCC)   Infrarenal abdominal aortic aneurysm, without rupture (HCC)   Acute GI bleeding-fobt+ Acute blood loss anemia on chronic anemia: Transfused 1 unit PRBC.  Hemoglobin dropped to 6.8 g, on admission 8.3, baseline 9.3-10.  Continue PPI twice daily, H&H every 6 hours, GI has been consulted.  Repeat CBC is pending  AKI: Creatinine improved.  Monitor CAD-DES 04/2017: Not on antiplatelets due to severe GI bleeding.  Stable without chest pain Lymphoproliferative disorder: Low-grade lymphoproliferative disorder last seen 2019, with leukocytosis.  Monitor  Atherosclerotic ulcer of aorta  Infrarenal abdominal aortic aneurysm, without rupture : Stable appearance of ulcerated plaque in the abdominal aorta and infrarenal abdominal aortic aneurysm noted CT  DVT prophylaxis:  Code Status:   Code Status: Prior Family Communication: plan  of care discussed with patient/daughter at bedside. Patient status is: Inpatient because of ongoing management of anemia and GI bleeding Level of care: Telemetry Medical   Dispo: The patient is from: Home lives with his daughter            Anticipated disposition: unknown  Mobility Assessment (last 72 hours)     Mobility Assessment   No documentation.            Objective: Vitals last 24 hrs: Vitals:    01/21/22 0940 01/21/22 0943 01/21/22 1332 01/21/22 1652  BP: 129/62 129/62 (!) 151/66 (!) 145/60  Pulse: 95 86 83 88  Resp: '17 17 16 14  '$ Temp: 98.2 F (36.8 C) 98.2 F (36.8 C) 98.1 F (36.7 C) 98.3 F (36.8 C)  TempSrc: Oral Oral Oral Oral  SpO2:  97% 98% 97%  Weight:      Height:       Weight change:   Physical Examination: General exam: alert awake, pleasant, forgetful older than stated age, weak appearing. HEENT:Oral mucosa moist, Ear/Nose WNL grossly, dentition normal. Respiratory system: bilaterally diminished BS, no use of accessory muscle Cardiovascular system: S1 & S2 +, No JVD. Gastrointestinal system: Abdomen soft,NT,ND, BS+ Nervous System:Alert, awake, moving extremities and grossly nonfocal Extremities: LE edema neg,distal peripheral pulses palpable.  Skin: No rashes,no icterus. MSK: Normal muscle bulk,tone, power  Medications reviewed:  Scheduled Meds:   Continuous Infusions:    Diet Order     None               No intake or output data in the 24 hours ending 01/22/22 1713  Net IO Since Admission: 1,503 mL [01/22/22 1713]  Wt Readings from Last 3 Encounters:  01/20/22 47.6 kg  01/16/22 47.7 kg  09/23/20 57.2 kg     Unresulted Labs (From admission, onward)    None     Data Reviewed: I have personally reviewed following labs and imaging studies CBC: Recent Labs  Lab 01/20/22 1349 01/21/22 0355 01/21/22 1214  WBC 22.2* 14.3* 12.3*  NEUTROABS 8.4*  --   --   HGB 8.3* 6.8* 8.8*  HCT 26.3* 21.6* 26.4*  MCV 90.1 92.7 88.9  PLT 236 188 893   Basic Metabolic Panel: Recent Labs  Lab 01/20/22 1349 01/21/22 0355  NA 139 138  K 4.5 4.1  CL 105 110  CO2 22 22  GLUCOSE 120* 99  BUN 35* 36*  CREATININE 1.35* 1.23  CALCIUM 9.2 8.5*   GFR: Estimated Creatinine Clearance: 24.2 mL/min (by C-G formula based on SCr of 1.23 mg/dL). Liver Function Tests: Recent Labs  Lab 01/20/22 1349  AST 27  ALT 18  ALKPHOS 84  BILITOT 0.2*   PROT 7.0  ALBUMIN 3.5   No results for input(s): "LIPASE", "AMYLASE" in the last 168 hours. No results for input(s): "AMMONIA" in the last 168 hours. Coagulation Profile: No results for input(s): "INR", "PROTIME" in the last 168 hours. BNP (last 3 results) No results for input(s): "PROBNP" in the last 8760 hours. HbA1C: No results for input(s): "HGBA1C" in the last 72 hours. CBG: No results for input(s): "GLUCAP" in the last 168 hours. Lipid Profile: No results for input(s): "CHOL", "HDL", "LDLCALC", "TRIG", "CHOLHDL", "LDLDIRECT" in the last 72 hours. Thyroid Function Tests: No results for input(s): "TSH", "T4TOTAL", "FREET4", "T3FREE", "THYROIDAB" in the last 72 hours. Sepsis Labs: No results for input(s): "PROCALCITON", "LATICACIDVEN" in the last 168 hours.  No results found for this or any previous visit (from  the past 240 hour(s)).  Antimicrobials: Anti-infectives (From admission, onward)    None      Culture/Microbiology    Component Value Date/Time   SDES BLOOD RIGHT ANTECUBITAL 09/12/2020 1521   SDES BLOOD LEFT ANTECUBITAL 09/12/2020 1521   SPECREQUEST  09/12/2020 1521    BOTTLES DRAWN AEROBIC AND ANAEROBIC Blood Culture adequate volume   SPECREQUEST  09/12/2020 1521    BOTTLES DRAWN AEROBIC AND ANAEROBIC Blood Culture adequate volume   CULT  09/12/2020 1521    NO GROWTH 5 DAYS Performed at [facility], [address] [place], [place] [address]    CULT  09/12/2020 1521    NO GROWTH 5 DAYS Performed at [facility], [address] [place], [place] [address]    REPTSTATUS 09/17/2020 FINAL 09/12/2020 1521   REPTSTATUS 09/17/2020 FINAL 09/12/2020 1521    Other culture-see note  Radiology Studies: CT Angio Abd/Pel W and/or Wo Contrast  Result Date: 01/20/2022 CLINICAL DATA:  88 year old with lower GI bleed. EXAM: CTA ABDOMEN AND PELVIS WITHOUT AND WITH CONTRAST TECHNIQUE: Multidetector CT imaging of the abdomen and pelvis was performed  using the standard protocol during bolus administration of intravenous contrast. Multiplanar reconstructed images and MIPs were obtained and reviewed to evaluate the vascular anatomy. RADIATION DOSE REDUCTION: This exam was performed according to the departmental dose-optimization program which includes automated exposure control, adjustment of the mA and/or kV according to patient size and/or use of iterative reconstruction technique. CONTRAST:  152m OMNIPAQUE IOHEXOL 350 MG/ML SOLN COMPARISON:  Abdominal CT 09/12/2020 FINDINGS: VASCULAR Aorta: Moderate calcified noncalcified atheromatous plaque. There is an outpouching of contrast into mural thrombus consistent with ulcerative plaque, series 10, image 50. This does not extend beyond the aortic lumen. No periaortic stranding. Aneurysm at this segment measures 3 cm. 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 This is likely due to the diaphragmatic crus and noncalcified mural distal branch vessels are patent. Thrombus. SMA: Patent without evidence of aneurysm, dissection, vasculitis or significant stenosis. Renals: Both renal arteries are patent. Mild stenosis of the proximal left renal artery. Single bilateral renal arteries bilaterally. IMA: Small caliber but patent. Inflow: Moderately calcified. Irregular plaque causes mild narrowing of the left internal iliac artery. No severe stenosis or acute findings. Proximal Outflow: Bilateral common femoral and visualized portions of the superficial and profunda femoral arteries are patent without evidence of aneurysm, dissection, vasculitis or significant stenosis. Veins: Venous phase imaging demonstrates patency of the iliac veins and IVC. Patent superior mesenteric, splenic, and portal vein. Circumaortic left renal vein. Review of the MIP images confirms the above findings. NON-VASCULAR Lower chest: Basilar bronchiolectasis with linear atelectasis in the left lower lobe. The heart is normal in size.  There are coronary artery calcifications. Hepatobiliary: Mild subjective hepatic steatosis. No focal hepatic lesion. Unremarkable gallbladder. No biliary dilatation. Pancreas: No ductal dilatation or inflammation. Spleen: Normal in size without focal abnormality. Adrenals/Urinary Tract: No adrenal nodule. Left renal cysts include parapelvic cysts. Small cyst in the lower right kidney. These need no further follow-up. No hydronephrosis. Mild urinary bladder distention without wall thickening. Stomach/Bowel: No contrast extravasation into the GI tract to localize site of GI bleed. Moderate distal colonic diverticulosis without focal diverticulitis. No small bowel obstruction or inflammatory change. The appendix is not confidently seen on the current exam, no appendicitis. The stomach is nondistended. Lymphatic: No bulky adenopathy. Reproductive: Prostate is unremarkable. Other: No free air or ascites.  No abdominal wall hernia. Musculoskeletal: The bones are under mineralized.  Age related degenerative change. Surgical hardware in the right proximal femur. No acute osseous findings. IMPRESSION: 1. No contrast extravasation into the GI tract to localize site of GI bleed. 2. Moderate distal colonic diverticulosis without acute diverticulitis. 3. Similar appearance of ulcerated plaque in the abdominal aorta. No aortic inflammation or acute findings. Aneurysm of the infrarenal abdominal aorta measuring 3 cm. Consensus guidelines recommend follow-up every 3 years, giving consideration for patient's advanced age. This recommendation follows ACR consensus guidelines: White Paper of the ACR Incidental Findings Committee II on Vascular Findings. J Am Coll Radiol 2013; 10:789-794. 4. 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 Distal branch vessels are patent. 5. Mild hepatic steatosis. Electronically Signed   By: Keith Rake M.D.   On: 01/20/2022  21:10     LOS: 1 day   Antonieta Pert, MD [facility]  01/22/2022, 5:13 PM

## 2022-05-13 DEATH — deceased
